# Patient Record
Sex: Female | Born: 1980
Health system: Southern US, Community
[De-identification: ages and names within clinical notes are randomized; demographics above are authoritative.]

## PROBLEM LIST (undated history)

## (undated) ENCOUNTER — Inpatient Hospital Stay (HOSPITAL_COMMUNITY): Payer: Self-pay

## (undated) DIAGNOSIS — M7989 Other specified soft tissue disorders: Secondary | ICD-10-CM

## (undated) DIAGNOSIS — Z6841 Body Mass Index (BMI) 40.0 and over, adult: Secondary | ICD-10-CM

## (undated) DIAGNOSIS — D869 Sarcoidosis, unspecified: Secondary | ICD-10-CM

## (undated) DIAGNOSIS — I10 Essential (primary) hypertension: Secondary | ICD-10-CM

## (undated) DIAGNOSIS — R7303 Prediabetes: Secondary | ICD-10-CM

## (undated) DIAGNOSIS — R002 Palpitations: Secondary | ICD-10-CM

## (undated) DIAGNOSIS — G4730 Sleep apnea, unspecified: Secondary | ICD-10-CM

## (undated) DIAGNOSIS — K219 Gastro-esophageal reflux disease without esophagitis: Secondary | ICD-10-CM

## (undated) DIAGNOSIS — R569 Unspecified convulsions: Secondary | ICD-10-CM

## (undated) DIAGNOSIS — E282 Polycystic ovarian syndrome: Secondary | ICD-10-CM

## (undated) DIAGNOSIS — E538 Deficiency of other specified B group vitamins: Secondary | ICD-10-CM

## (undated) DIAGNOSIS — E7800 Pure hypercholesterolemia, unspecified: Secondary | ICD-10-CM

## (undated) DIAGNOSIS — O10911 Unspecified pre-existing hypertension complicating pregnancy, first trimester: Secondary | ICD-10-CM

## (undated) DIAGNOSIS — R0602 Shortness of breath: Secondary | ICD-10-CM

## (undated) DIAGNOSIS — D649 Anemia, unspecified: Secondary | ICD-10-CM

## (undated) DIAGNOSIS — E559 Vitamin D deficiency, unspecified: Secondary | ICD-10-CM

## (undated) DIAGNOSIS — K5792 Diverticulitis of intestine, part unspecified, without perforation or abscess without bleeding: Secondary | ICD-10-CM

## (undated) HISTORY — DX: Sarcoidosis, unspecified: D86.9

## (undated) HISTORY — DX: Gastro-esophageal reflux disease without esophagitis: K21.9

## (undated) HISTORY — DX: Shortness of breath: R06.02

## (undated) HISTORY — DX: Pure hypercholesterolemia, unspecified: E78.00

## (undated) HISTORY — DX: Anemia, unspecified: D64.9

## (undated) HISTORY — DX: Unspecified pre-existing hypertension complicating pregnancy, first trimester: O10.911

## (undated) HISTORY — DX: Vitamin D deficiency, unspecified: E55.9

## (undated) HISTORY — DX: Body Mass Index (BMI) 40.0 and over, adult: Z684

## (undated) HISTORY — DX: Prediabetes: R73.03

## (undated) HISTORY — DX: Morbid (severe) obesity due to excess calories: E66.01

## (undated) HISTORY — DX: Other specified soft tissue disorders: M79.89

## (undated) HISTORY — DX: Sleep apnea, unspecified: G47.30

## (undated) HISTORY — DX: Essential (primary) hypertension: I10

## (undated) HISTORY — DX: Polycystic ovarian syndrome: E28.2

## (undated) HISTORY — DX: Deficiency of other specified B group vitamins: E53.8

---
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
## 2002-05-29 ENCOUNTER — Inpatient Hospital Stay (HOSPITAL_COMMUNITY): Admission: AD | Admit: 2002-05-29 | Discharge: 2002-05-29 | Payer: Self-pay | Admitting: *Deleted

## 2002-09-01 ENCOUNTER — Emergency Department (HOSPITAL_COMMUNITY): Admission: EM | Admit: 2002-09-01 | Discharge: 2002-09-01 | Payer: Self-pay | Admitting: Emergency Medicine

## 2002-11-12 ENCOUNTER — Inpatient Hospital Stay (HOSPITAL_COMMUNITY): Admission: AD | Admit: 2002-11-12 | Discharge: 2002-11-12 | Payer: Self-pay | Admitting: Obstetrics & Gynecology

## 2004-03-16 ENCOUNTER — Ambulatory Visit: Payer: Self-pay | Admitting: Gastroenterology

## 2004-04-10 ENCOUNTER — Ambulatory Visit: Payer: Self-pay | Admitting: Gastroenterology

## 2004-06-09 ENCOUNTER — Ambulatory Visit: Payer: Self-pay | Admitting: Gastroenterology

## 2004-06-16 ENCOUNTER — Ambulatory Visit: Payer: Self-pay | Admitting: Gastroenterology

## 2006-02-21 ENCOUNTER — Emergency Department (HOSPITAL_COMMUNITY): Admission: EM | Admit: 2006-02-21 | Discharge: 2006-02-21 | Payer: Self-pay | Admitting: Emergency Medicine

## 2008-03-12 ENCOUNTER — Emergency Department (HOSPITAL_COMMUNITY): Admission: EM | Admit: 2008-03-12 | Discharge: 2008-03-12 | Payer: Self-pay | Admitting: Emergency Medicine

## 2008-09-27 ENCOUNTER — Inpatient Hospital Stay (HOSPITAL_COMMUNITY): Admission: AD | Admit: 2008-09-27 | Discharge: 2008-09-28 | Payer: Self-pay | Admitting: Obstetrics & Gynecology

## 2008-09-27 ENCOUNTER — Ambulatory Visit: Payer: Self-pay | Admitting: Physician Assistant

## 2009-01-29 ENCOUNTER — Ambulatory Visit: Payer: Self-pay | Admitting: Obstetrics and Gynecology

## 2009-01-29 ENCOUNTER — Encounter: Payer: Self-pay | Admitting: Obstetrics and Gynecology

## 2009-01-30 ENCOUNTER — Encounter: Payer: Self-pay | Admitting: Obstetrics and Gynecology

## 2009-01-30 LAB — CONVERTED CEMR LAB
Estradiol: 134 pg/mL
FSH: 5.6 milliintl units/mL
Hgb A1c MFr Bld: 5.7 % (ref 4.6–6.1)
Prolactin: 8.1 ng/mL
Sex Hormone Binding: 26 nmol/L (ref 18–114)
TSH: 1.225 microintl units/mL (ref 0.350–4.500)
Testosterone Free: 21.7 pg/mL — ABNORMAL HIGH (ref 0.6–6.8)
Testosterone-% Free: 2.1 % (ref 0.4–2.4)
Testosterone: 103.45 ng/dL — ABNORMAL HIGH (ref 10–70)
Trich, Wet Prep: NONE SEEN
WBC, Wet Prep HPF POC: NONE SEEN
Yeast Wet Prep HPF POC: NONE SEEN

## 2009-02-03 ENCOUNTER — Ambulatory Visit (HOSPITAL_COMMUNITY): Admission: RE | Admit: 2009-02-03 | Discharge: 2009-02-03 | Payer: Self-pay | Admitting: Obstetrics and Gynecology

## 2009-02-13 ENCOUNTER — Ambulatory Visit: Payer: Self-pay | Admitting: Obstetrics & Gynecology

## 2009-04-11 ENCOUNTER — Ambulatory Visit: Payer: Self-pay | Admitting: Advanced Practice Midwife

## 2009-04-11 ENCOUNTER — Inpatient Hospital Stay (HOSPITAL_COMMUNITY): Admission: AD | Admit: 2009-04-11 | Discharge: 2009-04-11 | Payer: Self-pay | Admitting: Obstetrics & Gynecology

## 2009-05-22 ENCOUNTER — Ambulatory Visit: Payer: Self-pay | Admitting: Obstetrics and Gynecology

## 2010-01-28 ENCOUNTER — Emergency Department (HOSPITAL_COMMUNITY): Admission: EM | Admit: 2010-01-28 | Discharge: 2010-01-28 | Payer: Self-pay | Admitting: Emergency Medicine

## 2010-06-17 ENCOUNTER — Emergency Department (HOSPITAL_COMMUNITY)
Admission: EM | Admit: 2010-06-17 | Discharge: 2010-06-17 | Disposition: A | Discharge status: Home / Self Care | Payer: Self-pay | Attending: Emergency Medicine | Admitting: Emergency Medicine

## 2010-06-17 DIAGNOSIS — K089 Disorder of teeth and supporting structures, unspecified: Secondary | ICD-10-CM | POA: Insufficient documentation

## 2010-07-08 ENCOUNTER — Emergency Department (HOSPITAL_COMMUNITY)
Admission: EM | Admit: 2010-07-08 | Discharge: 2010-07-08 | Disposition: A | Discharge status: Home / Self Care | Payer: Self-pay | Attending: Emergency Medicine | Admitting: Emergency Medicine

## 2010-07-08 DIAGNOSIS — R21 Rash and other nonspecific skin eruption: Secondary | ICD-10-CM | POA: Insufficient documentation

## 2010-07-08 DIAGNOSIS — K029 Dental caries, unspecified: Secondary | ICD-10-CM | POA: Insufficient documentation

## 2010-07-08 DIAGNOSIS — G40909 Epilepsy, unspecified, not intractable, without status epilepticus: Secondary | ICD-10-CM | POA: Insufficient documentation

## 2010-07-08 DIAGNOSIS — K089 Disorder of teeth and supporting structures, unspecified: Secondary | ICD-10-CM | POA: Insufficient documentation

## 2010-07-27 LAB — CBC
MCHC: 33.2 g/dL (ref 30.0–36.0)
MCV: 84.9 fL (ref 78.0–100.0)
RBC: 4.29 MIL/uL (ref 3.87–5.11)
RDW: 15.1 % (ref 11.5–15.5)

## 2010-08-03 LAB — URINALYSIS, ROUTINE W REFLEX MICROSCOPIC
Glucose, UA: NEGATIVE mg/dL
Ketones, ur: NEGATIVE mg/dL
Leukocytes, UA: NEGATIVE
Protein, ur: NEGATIVE mg/dL
Urobilinogen, UA: 0.2 mg/dL (ref 0.0–1.0)

## 2010-08-03 LAB — URINE MICROSCOPIC-ADD ON

## 2010-12-09 ENCOUNTER — Emergency Department (HOSPITAL_COMMUNITY)
Admission: EM | Admit: 2010-12-09 | Discharge: 2010-12-09 | Disposition: A | Discharge status: Home / Self Care | Payer: Self-pay | Attending: Emergency Medicine | Admitting: Emergency Medicine

## 2010-12-09 DIAGNOSIS — B373 Candidiasis of vulva and vagina: Secondary | ICD-10-CM | POA: Insufficient documentation

## 2010-12-09 DIAGNOSIS — B3731 Acute candidiasis of vulva and vagina: Secondary | ICD-10-CM | POA: Insufficient documentation

## 2010-12-09 LAB — WET PREP, GENITAL: Yeast Wet Prep HPF POC: NONE SEEN

## 2010-12-21 ENCOUNTER — Emergency Department (HOSPITAL_COMMUNITY)
Admission: EM | Admit: 2010-12-21 | Discharge: 2010-12-21 | Disposition: A | Discharge status: Home / Self Care | Payer: Self-pay | Attending: Emergency Medicine | Admitting: Emergency Medicine

## 2010-12-21 DIAGNOSIS — M79609 Pain in unspecified limb: Secondary | ICD-10-CM | POA: Insufficient documentation

## 2011-10-22 ENCOUNTER — Emergency Department (HOSPITAL_COMMUNITY)
Admission: EM | Admit: 2011-10-22 | Discharge: 2011-10-22 | Disposition: A | Discharge status: Home / Self Care | Payer: No Typology Code available for payment source | Attending: Emergency Medicine | Admitting: Emergency Medicine

## 2011-10-22 ENCOUNTER — Encounter (HOSPITAL_COMMUNITY): Payer: Self-pay | Admitting: Emergency Medicine

## 2011-10-22 ENCOUNTER — Emergency Department (HOSPITAL_COMMUNITY): Payer: No Typology Code available for payment source

## 2011-10-22 DIAGNOSIS — M79609 Pain in unspecified limb: Secondary | ICD-10-CM | POA: Insufficient documentation

## 2011-10-22 DIAGNOSIS — M79602 Pain in left arm: Secondary | ICD-10-CM

## 2011-10-22 DIAGNOSIS — M542 Cervicalgia: Secondary | ICD-10-CM | POA: Insufficient documentation

## 2011-10-22 DIAGNOSIS — M549 Dorsalgia, unspecified: Secondary | ICD-10-CM | POA: Insufficient documentation

## 2011-10-22 DIAGNOSIS — F17200 Nicotine dependence, unspecified, uncomplicated: Secondary | ICD-10-CM | POA: Insufficient documentation

## 2011-10-22 HISTORY — DX: Unspecified convulsions: R56.9

## 2011-10-22 MED ORDER — NAPROXEN 375 MG PO TABS: 375.0000 mg | ORAL_TABLET | Freq: Two times a day (BID) | ORAL | Status: DC

## 2011-10-22 MED ORDER — KETOROLAC TROMETHAMINE 60 MG/2ML IM SOLN
60.0000 mg | Freq: Once | INTRAMUSCULAR | Status: AC
Start: 2011-10-22 — End: 2011-10-22
  Administered 2011-10-22: 60 mg via INTRAMUSCULAR
  Filled 2011-10-22: qty 2

## 2011-10-22 MED ORDER — METHOCARBAMOL 500 MG PO TABS: 500.0000 mg | ORAL_TABLET | Freq: Four times a day (QID) | ORAL | Status: AC | PRN

## 2011-10-22 NOTE — ED Provider Notes (Signed)
Medical screening examination/treatment/procedure(s) were performed by non-physician practitioner and as supervising physician I was immediately available for consultation/collaboration.   Forbes Cellar, MD 10/22/11 1645
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
## 2012-02-09 ENCOUNTER — Inpatient Hospital Stay (HOSPITAL_COMMUNITY): Payer: Medicaid Other

## 2012-02-09 ENCOUNTER — Inpatient Hospital Stay (HOSPITAL_COMMUNITY)
Admission: AD | Admit: 2012-02-09 | Discharge: 2012-02-09 | Disposition: A | Discharge status: Home / Self Care | Payer: Medicaid Other | Source: Ambulatory Visit | Attending: Obstetrics & Gynecology | Admitting: Obstetrics & Gynecology

## 2012-02-09 ENCOUNTER — Encounter (HOSPITAL_COMMUNITY): Payer: Self-pay | Admitting: *Deleted

## 2012-02-09 DIAGNOSIS — O26899 Other specified pregnancy related conditions, unspecified trimester: Secondary | ICD-10-CM

## 2012-02-09 DIAGNOSIS — N926 Irregular menstruation, unspecified: Secondary | ICD-10-CM

## 2012-02-09 DIAGNOSIS — E669 Obesity, unspecified: Secondary | ICD-10-CM | POA: Insufficient documentation

## 2012-02-09 DIAGNOSIS — E282 Polycystic ovarian syndrome: Secondary | ICD-10-CM | POA: Insufficient documentation

## 2012-02-09 DIAGNOSIS — R109 Unspecified abdominal pain: Secondary | ICD-10-CM

## 2012-02-09 DIAGNOSIS — N949 Unspecified condition associated with female genital organs and menstrual cycle: Secondary | ICD-10-CM

## 2012-02-09 DIAGNOSIS — Z3400 Encounter for supervision of normal first pregnancy, unspecified trimester: Secondary | ICD-10-CM

## 2012-02-09 DIAGNOSIS — O99891 Other specified diseases and conditions complicating pregnancy: Secondary | ICD-10-CM | POA: Insufficient documentation

## 2012-02-09 DIAGNOSIS — O34599 Maternal care for other abnormalities of gravid uterus, unspecified trimester: Secondary | ICD-10-CM | POA: Insufficient documentation

## 2012-02-09 HISTORY — DX: Diverticulitis of intestine, part unspecified, without perforation or abscess without bleeding: K57.92

## 2012-02-09 LAB — WET PREP, GENITAL

## 2012-02-09 LAB — URINALYSIS, ROUTINE W REFLEX MICROSCOPIC
Bilirubin Urine: NEGATIVE
Ketones, ur: NEGATIVE mg/dL
Leukocytes, UA: NEGATIVE
Nitrite: NEGATIVE
Urobilinogen, UA: 0.2 mg/dL (ref 0.0–1.0)

## 2012-02-09 MED ORDER — PRENATAL VITAMINS (DIS) PO TABS: 1.0000 | ORAL_TABLET | Freq: Every day | ORAL | Status: DC

## 2012-02-09 NOTE — MAU Note (Signed)
Patient states she has a history of irregular periods. Has been diagnosed with PCOS. Had some regular periods from October 2012 to June 2013, has not had one since. Has had breast pain and abdominal pain for a while. Denies any bleeding or discharge at this time.

## 2012-02-09 NOTE — MAU Provider Note (Signed)
  History     CSN: [id number]  Arrival date and time: 02/09/12 1800   First Provider Initiated Contact with Patient 02/09/12 2036      Chief Complaint  Patient presents with  . Abdominal Pain   HPI  Diana Blair is a 31 y.o. G1P0 who presents today with abdominal pain and uncertain LMP. She has had the pain off and on for a few weeks now. She has irregular menstrual cycles and was "shocked" to hear her UPT was positive today. Due to a HX of PCOS is has no idea how far along she could be. She denies any vaginal bleeding or change in discharge.   Past Medical History  Diagnosis Date  . Seizure   . Diverticulitis     Past Surgical History  Procedure Date  . No past surgeries     Family History  Problem Relation Age of Onset  . Other Neg Hx     History  Substance Use Topics  . Smoking status: Former Smoker    Types: Cigarettes    Quit date: 02/08/2009  . Smokeless tobacco: Never Used  . Alcohol Use: No    Allergies: No Known Allergies  No prescriptions prior to admission    Review of Systems  Constitutional: Negative for fever and chills.  Eyes: Negative for blurred vision.  Respiratory: Negative for cough.   Cardiovascular: Negative for chest pain.  Gastrointestinal: Positive for abdominal pain (see HPI). Negative for heartburn, nausea, vomiting, diarrhea and constipation.  Genitourinary: Positive for dysuria. Negative for urgency and frequency.  Neurological: Negative for headaches.   Physical Exam   Blood pressure 127/72, pulse 90, temperature 98.6 F (37 C), temperature source Oral, resp. rate 20, height 5\' 5"  (1.651 m), weight 160.936 kg (354 lb 12.8 oz), last menstrual period 10/16/2011, SpO2 100.00%.  Physical Exam  Nursing note and vitals reviewed. Constitutional: She appears well-developed and well-nourished.  Cardiovascular: Normal rate and regular rhythm.   Respiratory: Effort normal and breath sounds normal.  GI: Soft. Bowel sounds are  normal.  Genitourinary: Vagina normal and uterus normal.        External: normal Vagina: normal Cervix: pink, smooth. No CMT Uterus: 14+ weeks size    MAU Course  Procedures  MATERNAL FINDINGS: Cervix: Cervical length measures cervical length measures 3.1 cm. Cervix is closed. Uterus/Adnexae: Both ovaries are visualized. Simple appearing cyst in the left ovary measuring 2 x 1.9 x 2.9 cm. No abnormal adnexal masses visualized. No free pelvic fluid collections.  IMPRESSION: Single intrauterine pregnancy. Estimated gestational age by BPD is 16 weeks 2 days. No abnormal extra-axial fluid collections. Simple appearing cyst in the left ovary.      Assessment and Plan   1. Abdominal pain complicating pregnancy   2. Obesity, morbid (more than 100 lbs over ideal weight or BMI > 40)   3. Polycystic ovarian syndrome   4. Irregular periods/menstrual cycles   5. Supervision of normal first pregnancy   6. Round ligament pain    Will start prenatal vitamins Second trimester danger signs reviewed Will schedule appt with [facility]  Tawnya Crook 02/09/2012, 8:42 PM

## 2012-02-10 NOTE — MAU Provider Note (Signed)
Pt called to say that the pharmacy did not have the prenatal vitamins that were ordered.  Pt wants prenatal vitamins called to [facility].  Prenatal Vitamin prescription called to [facility] [phone]; order given directly to pharmacist to give pt what they had available.

## 2012-02-11 NOTE — MAU Provider Note (Signed)
Chart reviewed and agree with management and plan.  

## 2012-02-23 ENCOUNTER — Ambulatory Visit (INDEPENDENT_AMBULATORY_CARE_PROVIDER_SITE_OTHER): Payer: Self-pay | Admitting: Obstetrics and Gynecology

## 2012-02-23 ENCOUNTER — Encounter: Payer: Self-pay | Admitting: Obstetrics and Gynecology

## 2012-02-23 VITALS — BP 140/96 | Temp 98.9°F | Wt 353.0 lb

## 2012-02-23 DIAGNOSIS — O0990 Supervision of high risk pregnancy, unspecified, unspecified trimester: Secondary | ICD-10-CM | POA: Insufficient documentation

## 2012-02-23 DIAGNOSIS — Z3400 Encounter for supervision of normal first pregnancy, unspecified trimester: Secondary | ICD-10-CM

## 2012-02-23 DIAGNOSIS — R030 Elevated blood-pressure reading, without diagnosis of hypertension: Secondary | ICD-10-CM

## 2012-02-23 DIAGNOSIS — IMO0001 Reserved for inherently not codable concepts without codable children: Secondary | ICD-10-CM | POA: Insufficient documentation

## 2012-02-23 LAB — POCT URINALYSIS DIP (DEVICE)
Bilirubin Urine: NEGATIVE
Glucose, UA: NEGATIVE mg/dL
Ketones, ur: NEGATIVE mg/dL
Leukocytes, UA: NEGATIVE
Specific Gravity, Urine: 1.015 (ref 1.005–1.030)

## 2012-02-23 NOTE — Progress Notes (Signed)
   Subjective:    Diana Blair is a G1P0 [redacted]w[redacted]d being seen today for her first obstetrical visit. . Patient does intend to breast feed. Pregnancy history fully reviewed.  Patient reports legs ache from prolonged standing at [place] job. Wants note for work.Ceasar Mons Vitals:   02/23/12 1020  BP: 140/96  Temp: 98.9 F (37.2 C)  Weight: 353 lb (160.12 kg)    HISTORY: OB History    Grav Para Term Preterm Abortions TAB SAB Ect Mult Living   1         0     # Outc Date GA Lbr Len/2nd Wgt Sex Del Anes PTL Lv   1 CUR              Past Medical History  Diagnosis Date  . Seizure   . Diverticulitis   . PCOS (polycystic ovarian syndrome)    Past Surgical History  Procedure Date  . No past surgeries    Family History  Problem Relation Age of Onset  . Other Neg Hx   . Cancer Mother     lung  . Diabetes Father      Exam    Uterus:   18wk size FHR 150  Pelvic Exam:    Perineum: No Hemorrhoids, Normal Perineum   Vulva: normal, Bartholin's, Urethra, Skene's normal   Vagina:  normal mucosa, normal discharge       Cervix: no lesions   Adnexa: not evaluated   Bony Pelvis: gynecoid  System: Breast:  normal appearance, no masses or tenderness   Skin: normal coloration and turgor, no rashes    Neurologic: oriented, normal mood, grossly non-focal   Extremities: normal strength, tone, and muscle mass   HEENT PERRLA and thyroid without masses   Mouth/Teeth mucous membranes moist, pharynx normal without lesions   Neck supple and no masses   Cardiovascular: regular rate and rhythm   Respiratory:  appears well, vitals normal, no respiratory distress, acyanotic, normal RR, ear and throat exam is normal, neck free of mass or lymphadenopathy, chest clear, no wheezing, crepitations, rhonchi, normal symmetric air entry   Abdomen: soft, non-tender; bowel sounds normal; no masses,  no organomegaly obese   Urinary: urethral meatus normal      Assessment:    Pregnancy: G1P0 @  [redacted]w[redacted]d Morbid obesity      Plan:     Initial labs drawn. 1 hr glucola and Pap done Prenatal vitamins. Problem list reviewed and updated. Genetic Screening discussed Quad Screen: declined.  Ultrasound discussed; fetal survey: will schedule today.  Follow up in 4 weeks. 70% of 40 min visit spent on counseling and coordination of care.  Discussed at length diet, social situation. Advised to call [facility] re: further pregnancy information and classes.    Ermelinda Eckert 02/23/2012  BP recheckk 142/80

## 2012-02-23 NOTE — Patient Instructions (Signed)
Pregnancy - Second Trimester The second trimester of pregnancy (3 to 6 months) is a period of rapid growth for you and your baby. At the end of the sixth month, your baby is about 9 inches long and weighs 1 1/2 pounds. You will begin to feel the baby move between 18 and 20 weeks of the pregnancy. This is called quickening. Weight gain is faster. A clear fluid (colostrum) may leak out of your breasts. You may feel small contractions of the womb (uterus). This is known as false labor or Braxton-Hicks contractions. This is like a practice for labor when the baby is ready to be born. Usually, the problems with morning sickness have usually passed by the end of your first trimester. Some women develop small dark blotches (called cholasma, mask of pregnancy) on their face that usually goes away after the baby is born. Exposure to the sun makes the blotches worse. Acne may also develop in some pregnant women and pregnant women who have acne, may find that it goes away. PRENATAL EXAMS  Blood work may continue to be done during prenatal exams. These tests are done to check on your health and the probable health of your baby. Blood work is used to follow your blood levels (hemoglobin). Anemia (low hemoglobin) is common during pregnancy. Iron and vitamins are given to help prevent this. You will also be checked for diabetes between 24 and 28 weeks of the pregnancy. Some of the previous blood tests may be repeated.  The size of the uterus is measured during each visit. This is to make sure that the baby is continuing to grow properly according to the dates of the pregnancy.  Your blood pressure is checked every prenatal visit. This is to make sure you are not getting toxemia.  Your urine is checked to make sure you do not have an infection, diabetes or protein in the urine.  Your weight is checked often to make sure gains are happening at the suggested rate. This is to ensure that both you and your baby are growing  normally.  Sometimes, an ultrasound is performed to confirm the proper growth and development of the baby. This is a test which bounces harmless sound waves off the baby so your caregiver can more accurately determine due dates. Sometimes, a specialized test is done on the amniotic fluid surrounding the baby. This test is called an amniocentesis. The amniotic fluid is obtained by sticking a needle into the belly (abdomen). This is done to check the chromosomes in instances where there is a concern about possible genetic problems with the baby. It is also sometimes done near the end of pregnancy if an early delivery is required. In this case, it is done to help make sure the baby's lungs are mature enough for the baby to live outside of the womb. CHANGES OCCURING IN THE SECOND TRIMESTER OF PREGNANCY Your body goes through many changes during pregnancy. They vary from person to person. Talk to your caregiver about changes you notice that you are concerned about.  During the second trimester, you will likely have an increase in your appetite. It is normal to have cravings for certain foods. This varies from person to person and pregnancy to pregnancy.  Your lower abdomen will begin to bulge.  You may have to urinate more often because the uterus and baby are pressing on your bladder. It is also common to get more bladder infections during pregnancy (pain with urination). You can help this by   drinking lots of fluids and emptying your bladder before and after intercourse.  You may begin to get stretch marks on your hips, abdomen, and breasts. These are normal changes in the body during pregnancy. There are no exercises or medications to take that prevent this change.  You may begin to develop swollen and bulging veins (varicose veins) in your legs. Wearing support hose, elevating your feet for 15 minutes, 3 to 4 times a day and limiting salt in your diet helps lessen the problem.  Heartburn may develop  as the uterus grows and pushes up against the stomach. Antacids recommended by your caregiver helps with this problem. Also, eating smaller meals 4 to 5 times a day helps.  Constipation can be treated with a stool softener or adding bulk to your diet. Drinking lots of fluids, vegetables, fruits, and whole grains are helpful.  Exercising is also helpful. If you have been very active up until your pregnancy, most of these activities can be continued during your pregnancy. If you have been less active, it is helpful to start an exercise program such as walking.  Hemorrhoids (varicose veins in the rectum) may develop at the end of the second trimester. Warm sitz baths and hemorrhoid cream recommended by your caregiver helps hemorrhoid problems.  Backaches may develop during this time of your pregnancy. Avoid heavy lifting, wear low heal shoes and practice good posture to help with backache problems.  Some pregnant women develop tingling and numbness of their hand and fingers because of swelling and tightening of ligaments in the wrist (carpel tunnel syndrome). This goes away after the baby is born.  As your breasts enlarge, you may have to get a bigger bra. Get a comfortable, cotton, support bra. Do not get a nursing bra until the last month of the pregnancy if you will be nursing the baby.  You may get a dark line from your belly button to the pubic area called the linea nigra.  You may develop rosy cheeks because of increase blood flow to the face.  You may develop spider looking lines of the face, neck, arms and chest. These go away after the baby is born. HOME CARE INSTRUCTIONS   It is extremely important to avoid all smoking, herbs, alcohol, and unprescribed drugs during your pregnancy. These chemicals affect the formation and growth of the baby. Avoid these chemicals throughout the pregnancy to ensure the delivery of a healthy infant.  Most of your home care instructions are the same as  suggested for the first trimester of your pregnancy. Keep your caregiver's appointments. Follow your caregiver's instructions regarding medication use, exercise and diet.  During pregnancy, you are providing food for you and your baby. Continue to eat regular, well-balanced meals. Choose foods such as meat, fish, milk and other low fat dairy products, vegetables, fruits, and whole-grain breads and cereals. Your caregiver will tell you of the ideal weight gain.  A physical sexual relationship may be continued up until near the end of pregnancy if there are no other problems. Problems could include early (premature) leaking of amniotic fluid from the membranes, vaginal bleeding, abdominal pain, or other medical or pregnancy problems.  Exercise regularly if there are no restrictions. Check with your caregiver if you are unsure of the safety of some of your exercises. The greatest weight gain will occur in the last 2 trimesters of pregnancy. Exercise will help you:  Control your weight.  Get you in shape for labor and delivery.  Lose weight   after you have the baby.  Wear a good support or jogging bra for breast tenderness during pregnancy. This may help if worn during sleep. Pads or tissues may be used in the bra if you are leaking colostrum.  Do not use hot tubs, steam rooms or saunas throughout the pregnancy.  Wear your seat belt at all times when driving. This protects you and your baby if you are in an accident.  Avoid raw meat, uncooked cheese, cat litter boxes and soil used by cats. These carry germs that can cause birth defects in the baby.  The second trimester is also a good time to visit your dentist for your dental health if this has not been done yet. Getting your teeth cleaned is OK. Use a soft toothbrush. Brush gently during pregnancy.  It is easier to loose urine during pregnancy. Tightening up and strengthening the pelvic muscles will help with this problem. Practice stopping your  urination while you are going to the bathroom. These are the same muscles you need to strengthen. It is also the muscles you would use as if you were trying to stop from passing gas. You can practice tightening these muscles up 10 times a set and repeating this about 3 times per day. Once you know what muscles to tighten up, do not perform these exercises during urination. It is more likely to contribute to an infection by backing up the urine.  Ask for help if you have financial, counseling or nutritional needs during pregnancy. Your caregiver will be able to offer counseling for these needs as well as refer you for other special needs.  Your skin may become oily. If so, wash your face with mild soap, use non-greasy moisturizer and oil or cream based makeup. MEDICATIONS AND DRUG USE IN PREGNANCY  Take prenatal vitamins as directed. The vitamin should contain 1 milligram of folic acid. Keep all vitamins out of reach of children. Only a couple vitamins or tablets containing iron may be fatal to a baby or young child when ingested.  Avoid use of all medications, including herbs, over-the-counter medications, not prescribed or suggested by your caregiver. Only take over-the-counter or prescription medicines for pain, discomfort, or fever as directed by your caregiver. Do not use aspirin.  Let your caregiver also know about herbs you may be using.  Alcohol is related to a number of birth defects. This includes fetal alcohol syndrome. All alcohol, in any form, should be avoided completely. Smoking will cause low birth rate and premature babies.  Street or illegal drugs are very harmful to the baby. They are absolutely forbidden. A baby born to an addicted mother will be addicted at birth. The baby will go through the same withdrawal an adult does. SEEK MEDICAL CARE IF:  You have any concerns or worries during your pregnancy. It is better to call with your questions if you feel they cannot wait, rather  than worry about them. SEEK IMMEDIATE MEDICAL CARE IF:   An unexplained oral temperature above 102 F (38.9 C) develops, or as your caregiver suggests.  You have leaking of fluid from the vagina (birth canal). If leaking membranes are suspected, take your temperature and tell your caregiver of this when you call.  There is vaginal spotting, bleeding, or passing clots. Tell your caregiver of the amount and how many pads are used. Light spotting in pregnancy is common, especially following intercourse.  You develop a bad smelling vaginal discharge with a change in the color from clear   to white.  You continue to feel sick to your stomach (nauseated) and have no relief from remedies suggested. You vomit blood or coffee ground-like materials.  You lose more than 2 pounds of weight or gain more than 2 pounds of weight over 1 week, or as suggested by your caregiver.  You notice swelling of your face, hands, feet, or legs.  You get exposed to German measles and have never had them.  You are exposed to fifth disease or chickenpox.  You develop belly (abdominal) pain. Round ligament discomfort is a common non-cancerous (benign) cause of abdominal pain in pregnancy. Your caregiver still must evaluate you.  You develop a bad headache that does not go away.  You develop fever, diarrhea, pain with urination, or shortness of breath.  You develop visual problems, blurry, or double vision.  You fall or are in a car accident or any kind of trauma.  There is mental or physical violence at home. Document Released: 04/06/2001 Document Revised: 07/05/2011 Document Reviewed: 10/09/2008 [employer] Patient Information 2013 [employer].  

## 2012-02-23 NOTE — Progress Notes (Signed)
Ob detail [place] scheduled on 11/1 @ 1400

## 2012-02-23 NOTE — Progress Notes (Signed)
Pulse 89.  2nd BP 142/80 C/o pain in pelvic, lower extremities; no pressure.

## 2012-02-24 LAB — GLUCOSE TOLERANCE, 1 HOUR (50G) W/O FASTING: Glucose, 1 Hour GTT: 87 mg/dL (ref 70–140)

## 2012-02-25 ENCOUNTER — Ambulatory Visit (HOSPITAL_COMMUNITY)
Admission: RE | Admit: 2012-02-25 | Discharge: 2012-02-25 | Disposition: A | Discharge status: Home / Self Care | Payer: Medicaid Other | Source: Ambulatory Visit | Attending: Obstetrics and Gynecology | Admitting: Obstetrics and Gynecology

## 2012-02-25 DIAGNOSIS — Z363 Encounter for antenatal screening for malformations: Secondary | ICD-10-CM | POA: Insufficient documentation

## 2012-02-25 DIAGNOSIS — E669 Obesity, unspecified: Secondary | ICD-10-CM | POA: Insufficient documentation

## 2012-02-25 DIAGNOSIS — O358XX0 Maternal care for other (suspected) fetal abnormality and damage, not applicable or unspecified: Secondary | ICD-10-CM | POA: Insufficient documentation

## 2012-02-25 DIAGNOSIS — Z3400 Encounter for supervision of normal first pregnancy, unspecified trimester: Secondary | ICD-10-CM

## 2012-02-25 DIAGNOSIS — Z1389 Encounter for screening for other disorder: Secondary | ICD-10-CM | POA: Insufficient documentation

## 2012-03-08 ENCOUNTER — Ambulatory Visit (INDEPENDENT_AMBULATORY_CARE_PROVIDER_SITE_OTHER): Payer: Self-pay | Admitting: Family

## 2012-03-08 ENCOUNTER — Telehealth: Payer: Self-pay | Admitting: General Practice

## 2012-03-08 VITALS — BP 136/88 | Temp 98.7°F | Wt 349.4 lb

## 2012-03-08 DIAGNOSIS — Z3400 Encounter for supervision of normal first pregnancy, unspecified trimester: Secondary | ICD-10-CM

## 2012-03-08 LAB — COMPREHENSIVE METABOLIC PANEL
ALT: 19 U/L (ref 0–35)
AST: 12 U/L (ref 0–37)
CO2: 24 mEq/L (ref 19–32)
Chloride: 104 mEq/L (ref 96–112)
Creat: 0.57 mg/dL (ref 0.50–1.10)
Sodium: 135 mEq/L (ref 135–145)
Total Bilirubin: 0.2 mg/dL — ABNORMAL LOW (ref 0.3–1.2)
Total Protein: 6.6 g/dL (ref 6.0–8.3)

## 2012-03-08 LAB — POCT URINALYSIS DIP (DEVICE)
Glucose, UA: NEGATIVE mg/dL
Hgb urine dipstick: NEGATIVE
Nitrite: NEGATIVE
Protein, ur: NEGATIVE mg/dL
Specific Gravity, Urine: 1.02 (ref 1.005–1.030)
Urobilinogen, UA: 0.2 mg/dL (ref 0.0–1.0)

## 2012-03-08 NOTE — Progress Notes (Signed)
Pulse: 86 Needs OB Panel drawn and HIV. Not drawn at last visit.  Has some pelvic pressure. Has pain if she stands for too long. Has pain at night trouble getting comfortable.  Has a wet mucous discharge.

## 2012-03-08 NOTE — Progress Notes (Signed)
Reports vaginal discharge with odor; obtain gc/ct and wet prep today; also needed OB panel, HIV, and urine culture.  Due to borderline blood pressure, obtain baseline PIH labs.  If bp remains elevated transfer to high risk clinic.

## 2012-03-08 NOTE — Telephone Encounter (Signed)
Patient called stating she was trying to reach [facility], she saw her today and forgot to ask Eino Farber a question and would like a call back. Called patient back and informed her I received her message, patient stated she was driving down the road and couldn't talk right now. I told patient to give [place] a call back at her convenience. Patient verbalized understanding

## 2012-03-09 LAB — OBSTETRIC PANEL
Basophils Absolute: 0 10*3/uL (ref 0.0–0.1)
Hepatitis B Surface Ag: NEGATIVE
Lymphocytes Relative: 15 % (ref 12–46)
Lymphs Abs: 1.6 10*3/uL (ref 0.7–4.0)
Neutrophils Relative %: 76 % (ref 43–77)
Platelets: 229 10*3/uL (ref 150–400)
RBC: 4.55 MIL/uL (ref 3.87–5.11)
RDW: 17.2 % — ABNORMAL HIGH (ref 11.5–15.5)
Rubella: 162.7 IU/mL — ABNORMAL HIGH
WBC: 10.4 10*3/uL (ref 4.0–10.5)

## 2012-03-09 NOTE — Telephone Encounter (Signed)
Called pt and left message that we have attempted to contact her x 2 and if she continues to have any questions to please give the clinics a call back.

## 2012-03-10 LAB — CULTURE, OB URINE: Colony Count: 25000

## 2012-03-11 ENCOUNTER — Encounter (HOSPITAL_COMMUNITY): Payer: Self-pay | Admitting: Women's Health

## 2012-03-11 ENCOUNTER — Inpatient Hospital Stay (HOSPITAL_COMMUNITY)
Admission: AD | Admit: 2012-03-11 | Discharge: 2012-03-19 | DRG: 775 | Disposition: A | Discharge status: Home / Self Care | Payer: Medicaid Other | Source: Ambulatory Visit | Attending: Obstetrics & Gynecology | Admitting: Obstetrics & Gynecology

## 2012-03-11 ENCOUNTER — Inpatient Hospital Stay (HOSPITAL_COMMUNITY): Payer: Medicaid Other

## 2012-03-11 DIAGNOSIS — O42919 Preterm premature rupture of membranes, unspecified as to length of time between rupture and onset of labor, unspecified trimester: Secondary | ICD-10-CM | POA: Diagnosis not present

## 2012-03-11 DIAGNOSIS — A499 Bacterial infection, unspecified: Secondary | ICD-10-CM | POA: Diagnosis present

## 2012-03-11 DIAGNOSIS — O4290 Premature rupture of membranes, unspecified as to length of time between rupture and onset of labor, unspecified weeks of gestation: Secondary | ICD-10-CM

## 2012-03-11 DIAGNOSIS — B9689 Other specified bacterial agents as the cause of diseases classified elsewhere: Secondary | ICD-10-CM | POA: Diagnosis present

## 2012-03-11 DIAGNOSIS — O411090 Infection of amniotic sac and membranes, unspecified, unspecified trimester, not applicable or unspecified: Secondary | ICD-10-CM

## 2012-03-11 DIAGNOSIS — O3430 Maternal care for cervical incompetence, unspecified trimester: Secondary | ICD-10-CM

## 2012-03-11 DIAGNOSIS — O411290 Chorioamnionitis, unspecified trimester, not applicable or unspecified: Secondary | ICD-10-CM | POA: Diagnosis not present

## 2012-03-11 DIAGNOSIS — E669 Obesity, unspecified: Secondary | ICD-10-CM | POA: Diagnosis present

## 2012-03-11 DIAGNOSIS — O2390 Unspecified genitourinary tract infection in pregnancy, unspecified trimester: Secondary | ICD-10-CM | POA: Diagnosis present

## 2012-03-11 DIAGNOSIS — N760 Acute vaginitis: Secondary | ICD-10-CM | POA: Diagnosis present

## 2012-03-11 LAB — URINALYSIS, ROUTINE W REFLEX MICROSCOPIC
Bilirubin Urine: NEGATIVE
Glucose, UA: NEGATIVE mg/dL
Hgb urine dipstick: NEGATIVE
Specific Gravity, Urine: 1.02 (ref 1.005–1.030)
Urobilinogen, UA: 0.2 mg/dL (ref 0.0–1.0)
pH: 6.5 (ref 5.0–8.0)

## 2012-03-11 LAB — CBC
HCT: 32.5 % — ABNORMAL LOW (ref 36.0–46.0)
Hemoglobin: 11 g/dL — ABNORMAL LOW (ref 12.0–15.0)
MCV: 79.9 fL (ref 78.0–100.0)
RBC: 4.07 MIL/uL (ref 3.87–5.11)
RDW: 16.1 % — ABNORMAL HIGH (ref 11.5–15.5)
WBC: 10.5 10*3/uL (ref 4.0–10.5)

## 2012-03-11 LAB — TYPE AND SCREEN
ABO/RH(D): O POS
Antibody Screen: NEGATIVE

## 2012-03-11 LAB — AMNISURE RUPTURE OF MEMBRANE (ROM) NOT AT ARMC: Amnisure ROM: NEGATIVE

## 2012-03-11 LAB — WET PREP, GENITAL: Trich, Wet Prep: NONE SEEN

## 2012-03-11 LAB — GROUP B STREP BY PCR: Group B strep by PCR: NEGATIVE

## 2012-03-11 LAB — ABO/RH: ABO/RH(D): O POS

## 2012-03-11 MED ORDER — DOCUSATE SODIUM 100 MG PO CAPS
100.0000 mg | ORAL_CAPSULE | Freq: Every day | ORAL | Status: DC
  Administered 2012-03-11 – 2012-03-16 (×6): 100 mg via ORAL
  Filled 2012-03-11 (×8): qty 1

## 2012-03-11 MED ORDER — LACTATED RINGERS IV SOLN
INTRAVENOUS | Status: DC
  Administered 2012-03-12 – 2012-03-13 (×5): via INTRAVENOUS
  Administered 2012-03-13: 20 mL/h via INTRAVENOUS
  Administered 2012-03-15: 20 mL via INTRAVENOUS

## 2012-03-11 MED ORDER — METRONIDAZOLE 500 MG PO TABS
500.0000 mg | ORAL_TABLET | Freq: Two times a day (BID) | ORAL | Status: DC
  Administered 2012-03-11 – 2012-03-16 (×11): 500 mg via ORAL
  Filled 2012-03-11 (×14): qty 1

## 2012-03-11 MED ORDER — SODIUM CHLORIDE 0.9 % IV SOLN
250.0000 mg | Freq: Four times a day (QID) | INTRAVENOUS | Status: DC
  Administered 2012-03-11 – 2012-03-13 (×8): 250 mg via INTRAVENOUS
  Filled 2012-03-11 (×9): qty 250

## 2012-03-11 MED ORDER — PRENATAL MULTIVITAMIN CH
1.0000 | ORAL_TABLET | Freq: Every day | ORAL | Status: DC
  Administered 2012-03-11 – 2012-03-16 (×6): 1 via ORAL
  Filled 2012-03-11 (×8): qty 1

## 2012-03-11 MED ORDER — LIDOCAINE HCL (PF) 1 % IJ SOLN
INTRAMUSCULAR | Status: AC
Start: 2012-03-11 — End: 2012-03-12
  Filled 2012-03-11: qty 30

## 2012-03-11 MED ORDER — SODIUM CHLORIDE 0.9 % IV SOLN
2.0000 g | Freq: Four times a day (QID) | INTRAVENOUS | Status: DC
  Administered 2012-03-11 – 2012-03-13 (×8): 2 g via INTRAVENOUS
  Filled 2012-03-11 (×8): qty 2000

## 2012-03-11 MED ORDER — AMOXICILLIN 500 MG PO CAPS
500.0000 mg | ORAL_CAPSULE | Freq: Three times a day (TID) | ORAL | Status: DC
  Administered 2012-03-13 – 2012-03-17 (×11): 500 mg via ORAL
  Filled 2012-03-11 (×15): qty 1

## 2012-03-11 MED ORDER — ZOLPIDEM TARTRATE 5 MG PO TABS: 5.0000 mg | ORAL_TABLET | Freq: Every evening | ORAL | Status: DC | PRN

## 2012-03-11 MED ORDER — CALCIUM CARBONATE ANTACID 500 MG PO CHEW
2.0000 | CHEWABLE_TABLET | ORAL | Status: DC | PRN
  Filled 2012-03-11: qty 2

## 2012-03-11 MED ORDER — ACETAMINOPHEN 325 MG PO TABS
650.0000 mg | ORAL_TABLET | ORAL | Status: DC | PRN
  Administered 2012-03-13: 650 mg via ORAL
  Filled 2012-03-11: qty 2

## 2012-03-11 MED ORDER — ERYTHROMYCIN BASE 250 MG PO TABS
250.0000 mg | ORAL_TABLET | Freq: Four times a day (QID) | ORAL | Status: DC
  Administered 2012-03-13 – 2012-03-17 (×15): 250 mg via ORAL
  Filled 2012-03-11 (×20): qty 1

## 2012-03-11 NOTE — H&P (Signed)
History     CSN: [id number]  Arrival date and time: 03/11/12 1256   None     Chief Complaint  Patient presents with  . Vaginal Discharge   HPI Diana Blair is a 31 y.o. G1P0 female at [redacted]w[redacted]d by LMP that correlates w/ 2nd trimester u/s @ 16wks, who presents w/ report of leakage of clear fluid last pm at unspecified time.  Reports good fm. Denies VB, uc's/cramping/pressure.  Initiated pnc @ [facility] at 18wk4d, has had 2 visits- borderline htn, pih labs were wnl.  Urine p/c ratio/24hr urine not done.  Declined genetic screening.  Anatomy u/s @ 18wks normal but suboptimal visualization- recommended to repeat at 24wks.  Early 1hr glucola 87.  H/O PCOS w/ approximately 4 menses/yr, LNMP 6/22.   OB History    Grav Para Term Preterm Abortions TAB SAB Ect Mult Living   1         0      Past Medical History  Diagnosis Date  . Seizure   . Diverticulitis   . PCOS (polycystic ovarian syndrome)     Past Surgical History  Procedure Date  . No past surgeries     Family History  Problem Relation Age of Onset  . Other Neg Hx   . Cancer Mother     lung  . Diabetes Father     History  Substance Use Topics  . Smoking status: Former Smoker    Types: Cigarettes    Quit date: 02/08/2009  . Smokeless tobacco: Never Used  . Alcohol Use: No    Allergies: No Known Allergies  Prescriptions prior to admission  Medication Sig Dispense Refill  . Prenatal Vit-Fe Fumarate-FA (PRENATAL MULTIVITAMIN) TABS Take 1 tablet by mouth daily.        Review of Systems  Constitutional: Negative.   HENT: Negative.   Eyes: Negative.   Respiratory: Negative.   Cardiovascular: Negative.   Gastrointestinal: Negative.   Genitourinary: Negative.   Musculoskeletal: Negative.   Skin: Negative.   Neurological: Negative.   Endo/Heme/Allergies: Negative.   Psychiatric/Behavioral: Negative.    Physical Exam   Blood pressure 111/45, pulse 96, temperature 98.1 F (36.7 C), temperature source Oral,  resp. rate 22, last menstrual period 10/16/2011.  Physical Exam  Constitutional: She appears well-developed and well-nourished.  HENT:  Head: Normocephalic.  Neck: Normal range of motion.  Cardiovascular: Normal rate and regular rhythm.   Respiratory: Effort normal.  GI: Soft.       Gravid, fundus U+1  Genitourinary: Vagina normal and uterus normal.       Spec exam: cervix visually opened to about 3cm w/ bulging membranes. No obvious pooling of fluid, but moderate-large amount thin white frothy malodorous d/c present.  Wet prep and slide for fern obtained.  Areas of +ferning on slide.  Amnisure obtained to confirm- was neg.  SVE: deferred  Spec exam by Dr. Penne Lash: same as above w/ addition of +pooling of fluid.  Gc/ct and gbs pcr obtained   FHT: 147 via doppler MAU Course  Procedures  Doppler FHT Spec exam x 2, wet prep, gc/ct, gbs pcr Crist Fat +, Amnisure - Bedside u/s UA U/S: transverse lie, subjectively normal AFI w/ 5.6cm vertical pocket, no measurable cervix, cervix open- internal os=2.5cm, external os=1.9cm  Results for orders placed during the hospital encounter of 03/11/12 (from the past 24 hour(s))  WET PREP, GENITAL     Status: Abnormal   Collection Time   03/11/12  1:45 PM  Component Value Range   Yeast Wet Prep HPF POC NONE SEEN  NONE SEEN   Trich, Wet Prep NONE SEEN  NONE SEEN   Clue Cells Wet Prep HPF POC FEW (*) NONE SEEN   WBC, Wet Prep HPF POC FEW (*) NONE SEEN  AMNISURE RUPTURE OF MEMBRANE (ROM)     Status: Normal   Collection Time   03/11/12  2:05 PM      Component Value Range   Amnisure ROM NEGATIVE    GROUP B STREP BY PCR     Status: Normal   Collection Time   03/11/12  3:39 PM      Component Value Range   Group B strep by PCR NEGATIVE  NEGATIVE  CBC     Status: Abnormal   Collection Time   03/11/12  4:32 PM      Component Value Range   WBC 10.5  4.0 - 10.5 K/uL   RBC 4.07  3.87 - 5.11 MIL/uL   Hemoglobin 11.0 (*) 12.0 - 15.0 g/dL   HCT  65.7 (*) 84.6 - 46.0 %   MCV 79.9  78.0 - 100.0 fL   MCH 27.0  26.0 - 34.0 pg   MCHC 33.8  30.0 - 36.0 g/dL   RDW 96.2 (*) 95.2 - 84.1 %   Platelets 200  150 - 400 K/uL  URINALYSIS, ROUTINE W REFLEX MICROSCOPIC     Status: Normal   Collection Time   03/11/12  4:40 PM      Component Value Range   Color, Urine YELLOW  YELLOW   APPearance CLEAR  CLEAR   Specific Gravity, Urine 1.020  1.005 - 1.030   pH 6.5  5.0 - 8.0   Glucose, UA NEGATIVE  NEGATIVE mg/dL   Hgb urine dipstick NEGATIVE  NEGATIVE   Bilirubin Urine NEGATIVE  NEGATIVE   Ketones, ur NEGATIVE  NEGATIVE mg/dL   Protein, ur NEGATIVE  NEGATIVE mg/dL   Urobilinogen, UA 0.2  0.0 - 1.0 mg/dL   Nitrite NEGATIVE  NEGATIVE   Leukocytes, UA NEGATIVE  NEGATIVE    Prenatal Profile: O+ Antibody neg HbsAg neg Rubella immune HIV non-reactive RPR neg GBS neg Assessment and Plan  A:  [redacted]w[redacted]d SIUP  Presumed PPROM  Incompetent cervix- app 3cm w/ BBOW  Few clues on wet prep w/ previously untreated BV- will treat  GBS neg by pcr 11/16  Morbid obesity  P:  Admit to Antenatal  Ampicillin and Erythromycin for presumed pprom  Bedrest, trendelenburg  Will repeat amnisure in am  Metronidazole 500mg  bid x 7d for BV   Discussed pt & poc w/ Dr. Garth Schlatter, [place] Every 03/11/2012, 3:00 PM    Pt seen and examined.  Speculum exam shows cervix visually dilated to 3 cm with doming membranes.  There is pooling of ferning.  Amniosure returned negative.  There was trace ferning in the periphery.  Will admit and consider as ruptured due to clinical picture (+pooling with no other source of infection).  Start Amp/Eythro.  Repeat amniosure in a.m. In case it is a false negative.  No vaginal exams.

## 2012-03-11 NOTE — MAU Provider Note (Signed)
History     CSN: [id number]  Arrival date and time: 03/11/12 1256   None     Chief Complaint  Patient presents with  . Vaginal Discharge   HPI Diana Blair is a 31 y.o. G1P0 female at [redacted]w[redacted]d by LMP that correlates w/ 2nd trimester u/s @ 16wks, who presents w/ report of leakage of clear fluid last pm at unspecified time.  Reports good fm. Denies VB, uc's/cramping/pressure.  Initiated pnc @ [facility] at 18wk4d, has had 2 visits- borderline htn, pih labs were wnl.  Urine p/c ratio/24hr urine not done.  Declined genetic screening.  Anatomy u/s @ 18wks normal but suboptimal visualization- recommended to repeat at 24wks.  Early 1hr glucola 87.  H/O PCOS w/ approximately 4 menses/yr, LNMP 6/22.   OB History    Grav Para Term Preterm Abortions TAB SAB Ect Mult Living   1         0      Past Medical History  Diagnosis Date  . Seizure   . Diverticulitis   . PCOS (polycystic ovarian syndrome)     Past Surgical History  Procedure Date  . No past surgeries     Family History  Problem Relation Age of Onset  . Other Neg Hx   . Cancer Mother     lung  . Diabetes Father     History  Substance Use Topics  . Smoking status: Former Smoker    Types: Cigarettes    Quit date: 02/08/2009  . Smokeless tobacco: Never Used  . Alcohol Use: No    Allergies: No Known Allergies  Prescriptions prior to admission  Medication Sig Dispense Refill  . Prenatal Vit-Fe Fumarate-FA (PRENATAL MULTIVITAMIN) TABS Take 1 tablet by mouth daily.        Review of Systems  Constitutional: Negative.   HENT: Negative.   Eyes: Negative.   Respiratory: Negative.   Cardiovascular: Negative.   Gastrointestinal: Negative.   Genitourinary: Negative.   Musculoskeletal: Negative.   Skin: Negative.   Neurological: Negative.   Endo/Heme/Allergies: Negative.   Psychiatric/Behavioral: Negative.    Physical Exam   Blood pressure 111/45, pulse 96, temperature 98.1 F (36.7 C), temperature source Oral, resp.  rate 22, last menstrual period 10/16/2011.  Physical Exam  Constitutional: She appears well-developed and well-nourished.  HENT:  Head: Normocephalic.  Neck: Normal range of motion.  Cardiovascular: Normal rate and regular rhythm.   Respiratory: Effort normal.  GI: Soft.       Gravid, fundus U+1  Genitourinary: Vagina normal and uterus normal.       Spec exam: cervix visually opened to about 3cm w/ bulging membranes. No obvious pooling of fluid, but moderate-large amount thin white frothy malodorous d/c present.  Wet prep and slide for fern obtained.  Areas of +ferning on slide.  Amnisure obtained to confirm- was neg.  SVE: deferred  Spec exam by Dr. Penne Lash: same as above w/ addition of +pooling of fluid.  Gc/ct and gbs pcr obtained   FHT: 147 via doppler MAU Course  Procedures  Doppler FHT Spec exam x 2, wet prep, gc/ct, gbs pcr Crist Fat +, Amnisure - Bedside u/s UA U/S: transverse lie, subjectively normal AFI w/ 5.6cm vertical pocket, no measurable cervix, cervix open- internal os=2.5cm, external os=1.9cm  Results for orders placed during the hospital encounter of 03/11/12 (from the past 24 hour(s))  WET PREP, GENITAL     Status: Abnormal   Collection Time   03/11/12  1:45 PM  Component Value Range   Yeast Wet Prep HPF POC NONE SEEN  NONE SEEN   Trich, Wet Prep NONE SEEN  NONE SEEN   Clue Cells Wet Prep HPF POC FEW (*) NONE SEEN   WBC, Wet Prep HPF POC FEW (*) NONE SEEN  AMNISURE RUPTURE OF MEMBRANE (ROM)     Status: Normal   Collection Time   03/11/12  2:05 PM      Component Value Range   Amnisure ROM NEGATIVE    GROUP B STREP BY PCR     Status: Normal   Collection Time   03/11/12  3:39 PM      Component Value Range   Group B strep by PCR NEGATIVE  NEGATIVE  CBC     Status: Abnormal   Collection Time   03/11/12  4:32 PM      Component Value Range   WBC 10.5  4.0 - 10.5 K/uL   RBC 4.07  3.87 - 5.11 MIL/uL   Hemoglobin 11.0 (*) 12.0 - 15.0 g/dL   HCT 40.3 (*)  47.4 - 46.0 %   MCV 79.9  78.0 - 100.0 fL   MCH 27.0  26.0 - 34.0 pg   MCHC 33.8  30.0 - 36.0 g/dL   RDW 25.9 (*) 56.3 - 87.5 %   Platelets 200  150 - 400 K/uL  URINALYSIS, ROUTINE W REFLEX MICROSCOPIC     Status: Normal   Collection Time   03/11/12  4:40 PM      Component Value Range   Color, Urine YELLOW  YELLOW   APPearance CLEAR  CLEAR   Specific Gravity, Urine 1.020  1.005 - 1.030   pH 6.5  5.0 - 8.0   Glucose, UA NEGATIVE  NEGATIVE mg/dL   Hgb urine dipstick NEGATIVE  NEGATIVE   Bilirubin Urine NEGATIVE  NEGATIVE   Ketones, ur NEGATIVE  NEGATIVE mg/dL   Protein, ur NEGATIVE  NEGATIVE mg/dL   Urobilinogen, UA 0.2  0.0 - 1.0 mg/dL   Nitrite NEGATIVE  NEGATIVE   Leukocytes, UA NEGATIVE  NEGATIVE     Assessment and Plan  A:  [redacted]w[redacted]d SIUP  Presumed PPROM  Incompetent cervix- app 3cm w/ BBOW  Few clues on wet prep w/ previously untreated BV- will treat  GBS neg by pcr 11/16  Morbid obesity  P:  Admit to Antenatal  Ampicillin and Erythromycin for presumed pprom  Bedrest, trendelenburg  Will repeat amnisure in am  Metronidazole 500mg  bid x 7d for BV   Discussed pt and poc w/ Dr. Garth Schlatter, [place] Every 03/11/2012, 3:00 PM

## 2012-03-11 NOTE — MAU Note (Signed)
Pt presents to MAU with chief complaint of of vaginal leaking of fluid and odorous discharge. Pt is [redacted]w[redacted]d G1

## 2012-03-11 NOTE — MAU Note (Signed)
Pt awaiting bed on [facility]- attempting to get IV started- attempt times 1 with no success. Lab at bedside now. Mary on [place] notified and will attempt and IV when patient arrives to [place].

## 2012-03-12 ENCOUNTER — Encounter (HOSPITAL_COMMUNITY): Payer: Self-pay | Admitting: Anesthesiology

## 2012-03-12 ENCOUNTER — Inpatient Hospital Stay (HOSPITAL_COMMUNITY): Payer: Medicaid Other

## 2012-03-12 DIAGNOSIS — O4290 Premature rupture of membranes, unspecified as to length of time between rupture and onset of labor, unspecified weeks of gestation: Secondary | ICD-10-CM

## 2012-03-12 NOTE — Progress Notes (Signed)
Patient ID: Diana Blair, female   DOB: Aug 25, 1980, 31 y.o.   MRN: [id number]  FACULTY PRACTICE ANTEPARTUM(COMPREHENSIVE) NOTE  Diana Blair is a 31 y.o. G1P0000 at [redacted]w[redacted]d  who is admitted for cervical incompetence and suspected rupture.   Length of Stay:  1  Days  Subjective:  Patient reports the fetal movement as active. Patient reports uterine contraction  activity as none. Patient reports  vaginal bleeding as none. Patient describes fluid per vagina as Clear.  Vitals:  Blood pressure 135/59, pulse 105, temperature 98.6 F (37 C), temperature source Oral, resp. rate 22, height 5\' 3"  (1.6 m), weight 158.305 kg (349 lb), last menstrual period 10/16/2011. Physical Examination:  Abdomen - soft, fundus non tender Extremities - Homan's sign negative bilaterally Perineum - fluid on perineum, cervical exam not done due to suspicion of rupture  Fetal Monitoring:  non FHT--fetus nonviable  Labs:  Recent Results (from the past 24 hour(s))  WET PREP, GENITAL   Collection Time   03/11/12  1:45 PM      Component Value Range   Yeast Wet Prep HPF POC NONE SEEN  NONE SEEN   Trich, Wet Prep NONE SEEN  NONE SEEN   Clue Cells Wet Prep HPF POC FEW (*) NONE SEEN   WBC, Wet Prep HPF POC FEW (*) NONE SEEN  AMNISURE RUPTURE OF MEMBRANE (ROM)   Collection Time   03/11/12  2:05 PM      Component Value Range   Amnisure ROM NEGATIVE    GROUP B STREP BY PCR   Collection Time   03/11/12  3:39 PM      Component Value Range   Group B strep by PCR NEGATIVE  NEGATIVE  CBC   Collection Time   03/11/12  4:32 PM      Component Value Range   WBC 10.5  4.0 - 10.5 K/uL   RBC 4.07  3.87 - 5.11 MIL/uL   Hemoglobin 11.0 (*) 12.0 - 15.0 g/dL   HCT 29.5 (*) 62.1 - 30.8 %   MCV 79.9  78.0 - 100.0 fL   MCH 27.0  26.0 - 34.0 pg   MCHC 33.8  30.0 - 36.0 g/dL   RDW 65.7 (*) 84.6 - 96.2 %   Platelets 200  150 - 400 K/uL  RPR   Collection Time   03/11/12  4:32 PM      Component Value Range   RPR NON  REACTIVE  NON REACTIVE  URINALYSIS, ROUTINE W REFLEX MICROSCOPIC   Collection Time   03/11/12  4:40 PM      Component Value Range   Color, Urine YELLOW  YELLOW   APPearance CLEAR  CLEAR   Specific Gravity, Urine 1.020  1.005 - 1.030   pH 6.5  5.0 - 8.0   Glucose, UA NEGATIVE  NEGATIVE mg/dL   Hgb urine dipstick NEGATIVE  NEGATIVE   Bilirubin Urine NEGATIVE  NEGATIVE   Ketones, ur NEGATIVE  NEGATIVE mg/dL   Protein, ur NEGATIVE  NEGATIVE mg/dL   Urobilinogen, UA 0.2  0.0 - 1.0 mg/dL   Nitrite NEGATIVE  NEGATIVE   Leukocytes, UA NEGATIVE  NEGATIVE  TYPE AND SCREEN   Collection Time   03/11/12  5:46 PM      Component Value Range   ABO/RH(D) O POS     Antibody Screen NEG     Sample Expiration 03/14/2012    ABO/RH   Collection Time   03/11/12  5:46 PM  Component Value Range   ABO/RH(D) O POS      Imaging Studies:    US--BPD shows 19 week 2 days as EGA.  Will discuss this with women's radiology staff as this is 2 weeks behind EGA established one month ago. Cervix shows no length and open about 3 cm  Medications:  Scheduled    . ampicillin (OMNIPEN) IV  2 g Intravenous Q6H   Followed by  . amoxicillin  500 mg Oral Q8H  . docusate sodium  100 mg Oral Daily  . erythromycin  250 mg Intravenous Q6H   Followed by  . erythromycin  250 mg Oral Q6H  . [EXPIRED] lidocaine      . metroNIDAZOLE  500 mg Oral Q12H  . prenatal multivitamin  1 tablet Oral Daily    ASSESSMENT: Patient Active Problem List  Diagnosis  . Supervision of normal first pregnancy  . Morbid obesity  . Elevated BP    PLAN: 31 yo G1P0 at 21 weeks 1 days with cervical incompetence and suspected PPROM  1-Continue Abx 2-Rpt amniosure sent now 3-Follow up on cultures 4-Have Dr. Eppie Gibson or Dr. Kyung Rudd look at US done yesterday as [facility] is 2 weeks off from an US done 02/09/12.  LEGGETT,KELLY H. 03/12/2012,6:42 AM

## 2012-03-12 NOTE — Anesthesia Preprocedure Evaluation (Addendum)
Anesthesia Evaluation  Patient identified by MRN, date of birth, ID band Patient awake    Reviewed: Allergy & Precautions, H&P , Patient's Chart, lab work & pertinent test results  Airway Mallampati: II TM Distance: >3 FB Neck ROM: full    Dental No notable dental hx. (+) Teeth Intact   Pulmonary former smoker,    Pulmonary exam normal       Cardiovascular Rhythm:regular Rate:Normal  Elevated BP's   Neuro/Psych Seizures -,  negative psych ROS   GI/Hepatic negative GI ROS, Neg liver ROS,   Endo/Other  Morbid obesity  Renal/GU negative Renal ROS  negative genitourinary   Musculoskeletal   Abdominal Normal abdominal exam  (+)   Peds  Hematology negative hematology ROS (+)   Anesthesia Other Findings   Reproductive/Obstetrics (+) Pregnancy + or - 20 weeks Incompetent Cervix                          Anesthesia Physical Anesthesia Plan  ASA: III  Anesthesia Plan: Spinal   Post-op Pain Management:    Induction:   Airway Management Planned:   Additional Equipment:   Intra-op Plan:   Post-operative Plan:   Informed Consent: I have reviewed the patients History and Physical, chart, labs and discussed the procedure including the risks, benefits and alternatives for the proposed anesthesia with the patient or authorized representative who has indicated his/her understanding and acceptance.   Dental Advisory Given  Plan Discussed with: Anesthesiologist  Anesthesia Plan Comments:         Anesthesia Quick Evaluation

## 2012-03-12 NOTE — Progress Notes (Signed)
FACULTY PRACTICE ANTEPARTUM(COMPREHENSIVE) NOTE  Diana Blair is a 31 y.o. G1P0000 at [redacted]w[redacted]d by midtrimester ultrasound who is admitted for cervical incompetence and suspected rupture due to increased vaginal discharge.  Evaluation to date shows ultrasound evidence of cervical incompetence with funnelling 2 cm wide x 3.4 cm long, noted the entire length of cervix , but Not protruding past the external os.   Amnisure has been done x 2 and shows negative results x2. Initial Crist Fat was positive, collected from the edge of an area at the edge of the clump of generous mucoid discharge.  I have repeated the speculum exam this pm. And can no longer see the bag of waters thru the thin generous mucus.Fern test is difficult to interpret, with no distinct ferning pattern, but some patterns seen that are suggestive of ferning.I call it negative. Slide kept.   Fetal presentation is cephalic.on followup ultrasound, and cervix funneling is less. Length of Stay:  1  Days  Subjective: No bleeding contractions or change in discharge Patient reports the fetal movement as active. Patient reports uterine contraction  activity as none. Patient reports  vaginal bleeding as none. Patient describes fluid per vagina as Other mucus discharge.  Vitals:  Blood pressure 123/42, pulse 89, temperature 98.9 F (37.2 C), temperature source Oral, resp. rate 20, height 5\' 3"  (1.6 m), weight 349 lb (158.305 kg), last menstrual period 10/16/2011. Physical Examination:  General appearance - alert, well appearing, and in no distress Heart - normal rate and regular rhythm Abdomen - soft, nontender, nondistended Fundal Height:  difficult to assess with pt obesity Cervical Exam: Not evaluated.  Extremities: extremities normal, atraumatic, no cyanosis or edema and Homans sign is negative, no sign of DVT with DTRs 2+ bilaterally Membranes:intact  Fetal Monitoring:  Baseline: 145 bpm  Labs:  Recent Results (from the past 24  hour(s))  AMNISURE RUPTURE OF MEMBRANE (ROM)   Collection Time   03/12/12  6:35 AM      Component Value Range   Amnisure ROM NEGATIVE      Imaging Studies:urrently EPIC will not allow sonographic studies to automatically populate into notes.  In the meantime, copy and paste results into note or free text.  Medications:  Scheduled    . ampicillin (OMNIPEN) IV  2 g Intravenous Q6H   Followed by  . amoxicillin  500 mg Oral Q8H  . docusate sodium  100 mg Oral Daily  . erythromycin  250 mg Intravenous Q6H   Followed by  . erythromycin  250 mg Oral Q6H  . [EXPIRED] lidocaine      . metroNIDAZOLE  500 mg Oral Q12H  . prenatal multivitamin  1 tablet Oral Daily   I have reviewed the patient's current medications.  ASSESSMENT: Patient Active Problem List  Diagnosis  . Supervision of normal first pregnancy  . Morbid obesity  . Elevated BP    PLAN: Mcdonald Cerclage in a.m.  Procedure, risks, potential complications reviewed in detail.  Valisa Karpel V 03/12/2012,6:00 PM

## 2012-03-12 NOTE — Progress Notes (Signed)
1419- spec exam done by dr. Sherryl Barters. Slide done for ferning.

## 2012-03-13 ENCOUNTER — Inpatient Hospital Stay (HOSPITAL_COMMUNITY): Payer: Medicaid Other | Admitting: Anesthesiology

## 2012-03-13 ENCOUNTER — Encounter (HOSPITAL_COMMUNITY): Payer: Self-pay | Admitting: Anesthesiology

## 2012-03-13 ENCOUNTER — Inpatient Hospital Stay (HOSPITAL_COMMUNITY): Payer: Medicaid Other

## 2012-03-13 ENCOUNTER — Encounter (HOSPITAL_COMMUNITY)
Admission: AD | Disposition: A | Discharge status: Home / Self Care | Payer: Self-pay | Source: Ambulatory Visit | Attending: Obstetrics & Gynecology

## 2012-03-13 HISTORY — PX: CERVICAL CERCLAGE: SHX1329

## 2012-03-13 LAB — AMNISURE RUPTURE OF MEMBRANE (ROM) NOT AT ARMC: Amnisure ROM: POSITIVE

## 2012-03-13 SURGERY — CERCLAGE, CERVIX, VAGINAL APPROACH
Anesthesia: Spinal | Site: Cervix | Wound class: Clean Contaminated

## 2012-03-13 MED ORDER — BUPIVACAINE IN DEXTROSE 0.75-8.25 % IT SOLN
INTRATHECAL | Status: DC | PRN
  Administered 2012-03-13: 1 mL via INTRATHECAL

## 2012-03-13 MED ORDER — MEPERIDINE HCL 25 MG/ML IJ SOLN: 6.2500 mg | INTRAMUSCULAR | Status: DC | PRN

## 2012-03-13 MED ORDER — CITRIC ACID-SODIUM CITRATE 334-500 MG/5ML PO SOLN
ORAL | Status: AC
  Filled 2012-03-13: qty 15

## 2012-03-13 MED ORDER — METOCLOPRAMIDE HCL 5 MG/ML IJ SOLN: 10.0000 mg | Freq: Once | INTRAMUSCULAR | Status: AC | PRN

## 2012-03-13 MED ORDER — FENTANYL CITRATE 0.05 MG/ML IJ SOLN: 25.0000 ug | INTRAMUSCULAR | Status: DC | PRN

## 2012-03-13 MED ORDER — POLYETHYLENE GLYCOL 3350 17 G PO PACK
17.0000 g | PACK | Freq: Two times a day (BID) | ORAL | Status: AC
  Filled 2012-03-13 (×4): qty 1

## 2012-03-13 SURGICAL SUPPLY — 22 items
CATH ROBINSON RED A/P 16FR (CATHETERS) ×2 IMPLANT
CLOTH BEACON ORANGE TIMEOUT ST (SAFETY) ×2 IMPLANT
COUNTER NEEDLE 1200 MAGNETIC (NEEDLE) ×1 IMPLANT
GLOVE BIO SURGEON ST LM GN SZ9 (GLOVE) ×2 IMPLANT
GLOVE BIO SURGEON STRL SZ 6.5 (GLOVE) ×2 IMPLANT
GLOVE BIO SURGEON STRL SZ7 (GLOVE) ×2 IMPLANT
GLOVE BIOGEL PI IND STRL 7.0 (GLOVE) IMPLANT
GLOVE BIOGEL PI IND STRL 9 (GLOVE) ×2 IMPLANT
GLOVE BIOGEL PI INDICATOR 7.0 (GLOVE) ×2
GLOVE BIOGEL PI INDICATOR 9 (GLOVE) ×2
GOWN PREVENTION PLUS LG XLONG (DISPOSABLE) ×2 IMPLANT
GOWN PREVENTION PLUS XLARGE (GOWN DISPOSABLE) ×2 IMPLANT
GOWN STRL REIN 3XL LVL4 (GOWN DISPOSABLE) ×2 IMPLANT
NEEDLE MAYO .5 CIRCLE (NEEDLE) ×1 IMPLANT
PACK VAGINAL MINOR WOMEN LF (CUSTOM PROCEDURE TRAY) ×2 IMPLANT
PAD OB MATERNITY 4.3X12.25 (PERSONAL CARE ITEMS) ×2 IMPLANT
PAD PREP 24X48 CUFFED NSTRL (MISCELLANEOUS) ×2 IMPLANT
SUT PROLENE 1 CT 1 30 (SUTURE) ×1 IMPLANT
TOWEL OR 17X24 6PK STRL BLUE (TOWEL DISPOSABLE) ×4 IMPLANT
TUBING NON-CON 1/4 X 20 CONN (TUBING) ×1 IMPLANT
WATER STERILE IRR 1000ML POUR (IV SOLUTION) ×2 IMPLANT
YANKAUER SUCT BULB TIP NO VENT (SUCTIONS) ×1 IMPLANT

## 2012-03-13 NOTE — Progress Notes (Signed)
Ur chart review completed.  

## 2012-03-13 NOTE — Transfer of Care (Signed)
Immediate Anesthesia Transfer of Care Note  Patient: Diana Blair  Procedure(s) Performed: Procedure(s) (LRB) with comments: CERCLAGE CERVICAL (N/A)  Patient Location: PACU  Anesthesia Type:Spinal  Level of Consciousness: awake, alert  and oriented  Airway & Oxygen Therapy: Patient Spontanous Breathing  Post-op Assessment: Report given to PACU RN and Post -op Vital signs reviewed and stable  Post vital signs: Reviewed and stable  Complications: No apparent anesthesia complications

## 2012-03-13 NOTE — Progress Notes (Signed)
FACULTY PRACTICE ANTEPARTUM(COMPREHENSIVE) NOTE  Diana Blair is a 31 y.o. G1P0000 at [redacted]w[redacted]d by midtrimester ultrasound who is admitted for cervical incompetence.   Fetal presentation is cephalic. Length of Stay:  2  Days  Subjective: Stable overnight Patient reports the fetal movement as active. Patient reports uterine contraction  activity as none. Patient reports  vaginal bleeding as none. Patient describes fluid per vagina as None.  Vitals:  Blood pressure 126/57, pulse 97, temperature 98.7 F (37.1 C), temperature source Oral, resp. rate 22, height 5\' 3"  (1.6 m), weight 349 lb (158.305 kg), last menstrual period 10/16/2011. Physical Examination:  General appearance - alert, well appearing, and in no distress and in trendelenburg,  Heart - normal rate and regular rhythm Abdomen - soft, nontender, nondistended Fundal Height:  size equals dates and exam limited by obesity Cervical Exam: Not evaluated. . Extremities: extremities normal, atraumatic, no cyanosis or edema and Homans sign is negative, no sign of DVT with DTRs 2+ bilaterally Membranes:intact  Fetal Monitoring:  140 on auscultation   Labs:  Recent Results (from the past 24 hour(s))  AMNISURE RUPTURE OF MEMBRANE (ROM)   Collection Time   03/12/12  6:35 AM      Component Value Range   Amnisure ROM NEGATIVE         . ampicillin (OMNIPEN) IV  2 g Intravenous Q6H   Followed by  . amoxicillin  500 mg Oral Q8H  . docusate sodium  100 mg Oral Daily  . erythromycin  250 mg Intravenous Q6H   Followed by  . erythromycin  250 mg Oral Q6H  . [EXPIRED] lidocaine      . metroNIDAZOLE  500 mg Oral Q12H  . prenatal multivitamin  1 tablet Oral Daily   I have reviewed the patient's current medications.  ASSESSMENT: Patient Active Problem List  Diagnosis  . Supervision of normal first pregnancy  . Morbid obesity  . Elevated BP    PLAN: Mcdonald Cerclage this am  Lama Narayanan V 03/13/2012,5:41 AM

## 2012-03-13 NOTE — Progress Notes (Signed)
Pt stating that she is feeling much better after bladder empty

## 2012-03-13 NOTE — Consult Note (Signed)
Maternal Fetal Medicine Consultation  Requesting Provider(s): Christin Bach, MD  Reason for consultation: Cervical insufficiency s/p rescue cerclage  HPI: Diana Blair is a 31 yo G1P0 currently at 50 2/7 weeks by dates who was admitted due to suspected cervical insufficiency.  She reports heavy vaginal discharge without pain/ cramping toward the end of last week.  Sterile speculum exam showed positive ferning with negative amnisure.  Admission ultrasound showed normal amniotic fluid volume with "an open cervix" with V-shaped funneling the length of the cervix.  Fetal membranes were visible on exam, but not hour-glassing into the vagina.  The patient was started on a course of IV ampicillin and Erythromycin.  Repeat sterile speculum exam this morning again showed pos ferning but amnisure was pending at the time of the procedure.  Based on previous negative amnisure, an ultrasound indicated McDonald cerclage was performed with AM without complications.  Since that time, the amnisure returned positive for ruptured membranes.  The patient denies any risk factors for incompetent cervix.  She denies vaginal bleeding or cramping.  Prenatal course has been complicated by obesity and possible chronic hypertension although the patient never previously required medications.  A baseline 24-hr urine was collected but not turned in due to hospitalization.  She is currently without complaints.  OB History: OB History    Grav Para Term Preterm Abortions TAB SAB Ect Mult Living   1 0 0 0 0 0 0 0 0 0       PMH:  Past Medical History  Diagnosis Date  . Seizure   . Diverticulitis   . PCOS (polycystic ovarian syndrome)     PSH:  Past Surgical History  Procedure Date  . No past surgeries    Meds:  Scheduled Meds:   . ampicillin (OMNIPEN) IV  2 g Intravenous Q6H   Followed by  . amoxicillin  500 mg Oral Q8H  . docusate sodium  100 mg Oral Daily  . erythromycin  250 mg Intravenous Q6H   Followed by    . erythromycin  250 mg Oral Q6H  . metroNIDAZOLE  500 mg Oral Q12H  . prenatal multivitamin  1 tablet Oral Daily   Continuous Infusions:   . lactated ringers     PRN Meds:.acetaminophen, calcium carbonate, fentaNYL, meperidine (DEMEROL) injection, metoCLOPramide, zolpidem  Allergies: NKDA  FH: Denies birth defects or hereditary disorders  Soc: Denies tobacco, ETOH or illicit drug use  Review of Systems: no vaginal bleeding or cramping/contractions,  no nausea/vomiting. All other systems reviewed and are negative.   PE:   Filed Vitals:   03/13/12 0925  BP: 117/56  Pulse: 83  Temp: 97.9 F (36.6 C)  Resp: 18    GEN: well-appearing female ABD: gravid, NT  Please see separate document for fetal ultrasound report.  Labs: CBC    Component Value Date/Time   WBC 10.5 03/11/2012 1632   RBC 4.07 03/11/2012 1632   HGB 11.0* 03/11/2012 1632   HCT 32.5* 03/11/2012 1632   PLT 200 03/11/2012 1632   MCV 79.9 03/11/2012 1632   MCH 27.0 03/11/2012 1632   MCHC 33.8 03/11/2012 1632   RDW 16.1* 03/11/2012 1632   LYMPHSABS 1.6 03/08/2012 1203   MONOABS 0.9 03/08/2012 1203   EOSABS 0.1 03/08/2012 1203   BASOSABS 0.0 03/08/2012 1203     A/P: 1) Single IUP at 21 1/7 weeks         2) Suspected cervical insufficiency s/p ultrasound indicated McDonald cerclage  3) Equivocal testing for ruptured membranes - repeat Amnisure this morning was positive for ruptured membranes         4) Morbid obesity  Recommendations: Concur with latency antibiotics- would continue IV amp/ erythro for 48 hour course and transition to po Amoxicillin/ Erythromycin for a total of 7 days of antibiotics. With cerclage stitch is place, there may be longer period of latency, but  there is a higher risk for infectious complications - would have low threshold to remove cerclage for signs/ symptoms of intraamniotic infection. Recommend discontinuing trundelenburg - may have bathroom privileges once  recovered from spinal Continue DVT prophylaxis with either pneumatic compression hose while in bed or with Lovinox.  Recommend follow up ultrasound with MFM toward the end of the week - if oligohydramnios is noted at that time, may reevaluate benefit vs. Risk of keeping cerclage in place.  If otherwise stable at that time, would consider hospital discharge with close outpatient management and possible readmission once viability reached (23-[redacted] weeks gestation).  Thank you for the opportunity to be a part of the care of [name]. Please contact our office if we can be of further assistance.   I spent approximately 30 minutes with this patient with over 50% of time spent in face-to-face counseling.  Alpha Gula, MD

## 2012-03-13 NOTE — Progress Notes (Signed)
FHR assessed on arrival to PACU.  FHR 148.  Pt tol well, will cont to monitor.

## 2012-03-13 NOTE — Op Note (Signed)
Preop: Pregnancy 21 weeks' gestation, cervical incompetency Postop: Same Procedure: McDonald cerclage single stitch Surgeon Emelda Fear Anesthesia: Spinal Complications: No intraoperative complications, however I noted an increase in vaginal mucus discharge, and collected during preparation for case for [facility] testing and Amnisure, and these tests have returned with fern test positive and this Amnisure is now positive suggesting membrane leakage. Patient still has a bulging bag of waters which is now no longer protruding into the cervix. Estimated blood loss: Minimal Details of procedure: Patient was taken to the operating room, allowed to sit upright while spinal anesthesia was placed, placed in supine position and good perineal anesthesia confirmed perineum was prepped and draped, and speculum inspection of the vagina revealed more mucus in the vagina than previously noted so Amnisure and fern test glass slide collection of fluid from the posterior vaginal vault was performed. With an open sided large Graves speculum in place the cervix was seen to be visibly {centimeter with the membranes protruding to the external os, slightly milky in color, with no visible leakage through the membranes.. Ring forceps were used to grasp the cervix edge,, and a circumferential pursestring suture of #1 Prolene was placed 2.5 cm up the outer surface of the cervix, and then cinched down while pushing the bulging bag of waters above the cervix with the use of a partially inflated Foley bulb. Foley bulb was removed after deflation, and the cervix was cinched down to where a ring forceps could be barely passed through the internal os. Good cervical length was accomplished. No leakage of fluid was noted during the procedure. Patient then was allowed to go to recovery room in good condition,. Sponge and needle counts were correct. The fern test was then inspected and did show ferning characteristic of membrane rupture. Amnisure  subsequently returned as positive. Patient was returned to the antenatal, to continue IV antibiotics with maternal-fetal medicine consult requested.

## 2012-03-13 NOTE — Anesthesia Postprocedure Evaluation (Signed)
  Anesthesia Post-op Note  Patient: Diana Blair  Procedure(s) Performed: Procedure(s) (LRB) with comments: CERCLAGE CERVICAL (N/A)  Patient Location: PACU  Anesthesia Type:Spinal  Level of Consciousness: awake, alert  and oriented  Airway and Oxygen Therapy: Patient Spontanous Breathing  Post-op Pain: none  Post-op Assessment: Post-op Vital signs reviewed, Patient's Cardiovascular Status Stable, Respiratory Function Stable, Patent Airway, No signs of Nausea or vomiting, Pain level controlled, No headache, No backache, No residual numbness and No residual motor weakness  Post-op Vital Signs: Reviewed and stable  Complications: No apparent anesthesia complications

## 2012-03-13 NOTE — Anesthesia Procedure Notes (Signed)
Spinal  Patient location during procedure: OR Start time: 03/13/2012 7:15 AM Staffing Anesthesiologist: Mako Pelfrey A. Performed by: anesthesiologist  Preanesthetic Checklist Completed: patient identified, site marked, surgical consent, pre-op evaluation, timeout performed, IV checked, risks and benefits discussed and monitors and equipment checked Spinal Block Patient position: sitting Prep: site prepped and draped and DuraPrep Patient monitoring: heart rate, cardiac monitor, continuous pulse ox and blood pressure Approach: midline Location: L3-4 Injection technique: single-shot Needle Needle type: Sprotte  Needle gauge: 24 G Needle length: 9 cm Needle insertion depth: 9 cm Assessment Sensory level: T10 Additional Notes Patient tolerated procedure well. Adequate sensory level.

## 2012-03-13 NOTE — Progress Notes (Signed)
Into pt room and getting ready to do i/o cath when pt starts emptying her bladder in the bed a large amt of urine.  Bed linens changed.

## 2012-03-13 NOTE — Progress Notes (Signed)
MD notified that pt c/o pressure and unable to void due to pt still having a hard time moving in the bed from the spinal.  Pt unable to get on the bedpan. Per MD please do I/O cath not indwelling.

## 2012-03-14 ENCOUNTER — Encounter (HOSPITAL_COMMUNITY): Payer: Self-pay | Admitting: Obstetrics and Gynecology

## 2012-03-14 NOTE — Progress Notes (Signed)
03/14/12 1400  Clinical Encounter Type  Visited With Patient  Visit Type Follow-up;Spiritual support;Social support  Recommendations Spiritual Care will follow for support.  Spiritual Encounters  Spiritual Needs Emotional;Literature  Stress Factors  Patient Stress Factors (worried about missing prenatal education opportunities)    Made lengthy (>1 hour) follow-up visit with Diana Blair, who was appreciative of opportunity to share about the loss of her mother in May, her story of learning she was pregnant (at 16 weeks), and other life milestones.  She is particularly concerned about missing prenatal education opportunities (birth/baby class, breastfeeding/lactation education, etc) and welcomes any additional information the hospital can provide.  I let her bedside RN Diana Blair know this, too.  Provided pastoral listening and reflection, witness to Diana Blair's story, and literature (magazines and inspirational booklets) per pt request.  Diana Blair is aware of ongoing chaplain availability.  Spiritual Care will follow for support.  [address] [place], [place] [phone]

## 2012-03-14 NOTE — Progress Notes (Signed)
1 Day Post-Op Procedure(s) (LRB): CERCLAGE CERVICAL (N/A) Pt is PROM at 21 weeks and 3/7 days.  Beign followed by MFM    Subjective: Patient reports tolerating PO, + BM and no problems voiding.  No pain at present.  No bleeding overnight.  Objective: I have reviewed patient's vital signs, intake and output, medications and labs.  General: alert, cooperative and mild distress GI: soft, non-tender; bowel sounds normal; no masses,  no organomegaly obeses  Assessment: s/p Procedure(s) (LRB) with comments: CERCLAGE CERVICAL (N/A): stable  Plan: discuseed with patient and partner in detail the risks of early ROM and the risk of continued nonviablility.  They both expressed understanding. Cont atbx for now. Bedrest Consider Lovenox for DVT prophylaxis  LOS: 3 days    Blair, Diana Schoon 03/14/2012, 8:32 AM

## 2012-03-14 NOTE — Progress Notes (Signed)
03/14/12 1100  Clinical Encounter Type  Visited With Patient (two visitors)  Visit Type Initial  Referral From Nurse    Made initial contact with pt, per RN referral, while she had two visitors; we plan to follow up around 1:00 pm if schedules allow (or tomorrow if need be) to get further acquainted when company has gone.  [address] [place], [place] [phone]

## 2012-03-15 ENCOUNTER — Inpatient Hospital Stay (HOSPITAL_COMMUNITY): Payer: Medicaid Other

## 2012-03-15 MED ORDER — SIMETHICONE 80 MG PO CHEW
80.0000 mg | CHEWABLE_TABLET | Freq: Three times a day (TID) | ORAL | Status: DC
  Administered 2012-03-15 – 2012-03-16 (×6): 80 mg via ORAL

## 2012-03-15 MED ORDER — ENOXAPARIN SODIUM 80 MG/0.8ML ~~LOC~~ SOLN
80.0000 mg | SUBCUTANEOUS | Status: DC
  Administered 2012-03-15 – 2012-03-16 (×2): 80 mg via SUBCUTANEOUS
  Filled 2012-03-15 (×3): qty 0.8

## 2012-03-15 NOTE — Progress Notes (Signed)
03/15/12 1000  Clinical Encounter Type  Visited With Patient and family together (Husband)  Visit Type Follow-up;Spiritual support;Social support  Spiritual Encounters  Spiritual Needs Emotional    Followed up with Diana Blair this morning, as her husband requested yesterday.  She's working hard to balance anxieties about possible outcomes with the deep hope that baby will be safe.  Provided opportunity for further sharing and emotional processing.  Will continue to follow for support.  [address] [place], [place] [phone]

## 2012-03-15 NOTE — Progress Notes (Signed)
2 Days Post-Op Procedure(s) (LRB): CERCLAGE CERVICAL (N/A)  Subjective: Patient reports + flatus and + BM. She denies vaginal bleeding or loss of fluid (only mucous).   Objective: I have reviewed patient's vital signs, intake and output, medications and labs.  General: alert Resp: clear to auscultation bilaterally Cardio: regular rate and rhythm, S1, S2 normal, no murmur, click, rub or gallop GI: soft, non-tender; bowel sounds normal; no masses,  no organomegaly Extremities: extremities normal, atraumatic, no cyanosis or edema  Assessment: s/p Procedure(s) (LRB) with comments: CERCLAGE CERVICAL (N/A): stable  Plan: Lovenox to prevent DVTs in this high risk patient.  LOS: 4 days    Diana Blair C. 03/15/2012, 6:38 AM

## 2012-03-16 ENCOUNTER — Inpatient Hospital Stay (HOSPITAL_COMMUNITY)
Admit: 2012-03-16 | Discharge: 2012-03-16 | Disposition: A | Discharge status: Home / Self Care | Payer: Medicaid Other | Attending: Obstetrics & Gynecology | Admitting: Obstetrics & Gynecology

## 2012-03-16 DIAGNOSIS — O42919 Preterm premature rupture of membranes, unspecified as to length of time between rupture and onset of labor, unspecified trimester: Secondary | ICD-10-CM | POA: Diagnosis not present

## 2012-03-16 NOTE — Progress Notes (Signed)
Pt to MFM 

## 2012-03-16 NOTE — Progress Notes (Signed)
Perinatal  Education brought by [employer] for pt. Pt asleep at this time.

## 2012-03-16 NOTE — Progress Notes (Signed)
[facility] ultrasound  Indication: 31 yr old G1P0 at [redacted]w[redacted]d with previable rupture of membranes and shortened cervix s/p cerclage for follow up ultrasound.  Findings: 1. Single intrauterine pregnancy. 2. Posterior placenta without evidence of previa. 3. Amniotic fluid volume appears decreased. 4. The cervix is not well visualized. 5. The limited anatomy survey is normal.  Recommendations: 1. Previable rupture of membranes with cerclage placed after rupture of membranes: - previously counseled - see consult note 2. Patient continues to have limited anatomy survey: views of ductal arch and right ventricular outflow tract are limited - recommend reevaluate fetal anatomy on follow up ultrasound 3. Recommend follow up ultrasound for fetal growth around 24 weeks or when patient would want full intervention to obtain a fetal weight  Eulis Foster, MD

## 2012-03-16 NOTE — Consult Note (Signed)
[facility]  Neonatal Medicine Consultation       03/16/2012    11:02 PM  I was called at the request of the patient's obstetrician (Dr. Penne Lash) to speak to this patient due to suspected PROM at 21 5/7 weeks.  The patient is currently at bedrest.  I had a long discussion with her and her partner lasting about 45 minutes.  I reviewed mortality and morbidity statistics for the various gestational ages from 14 to 56 weeks.  We have not had any survivals at < 23 weeks, although around the [place] there have been a small percentage (most with moderate-severe or worse neurodevelopmental disability).  If she can keep the pregnancy beyond 23 weeks, there is an increasing chance of survival.  Generally 23 week births have about 20-30% chance of survival, however only about 10% of 23-week babies survive without significant disability.  For 24 weekers, survival rises to closer to 50%, or about 40% survival without significant disability.  And for 25 weekers, survival is about 75% or better, or 50-60% survival without significant disability.  I tried to given these parents a basic idea of this concept.  Before 23 weeks, the baby has no chance of survival at this time.  Beyond that age, survival with steadily improve and disability risk will steadily decline.    I reviewed expected problems--RDS, chronic lung disease, infection, feeding, IVH, breast feeding (which I recommended, and mom plans to do), expected length of stay.  I got a multitude of questions from the these parents, especially regarding OB issues (which I kept referring them back to their obstetrician for an answer).  Especially from the father.  They are very committed to this baby, and express wanting to do anything and everything to help the child, even right now while undelivered.  So we will expect to be called to delivery if mom gets close to 23 weeks and beyond.  _____________________ Electronically Signed By: Angelita Ingles, MD Neonatologist

## 2012-03-16 NOTE — Progress Notes (Signed)
Patient ID: Diana Blair, female   DOB: 1981/04/10, 31 y.o.   MRN: [id number] 3 Days Post-Op Procedure(s) (LRB): CERCLAGE CERVICAL (N/A)  Subjective: Patient reports + flatus and + BM.  No bleeding or gushes of watery fluid  Objective: I have reviewed patient's vital signs, intake and output, medications and labs. Filed Vitals:   03/15/12 1442 03/15/12 1659 03/15/12 2044 03/16/12 0028  BP:  121/50 108/46 123/53  Pulse:  83 97 86  Temp:  98.7 F (37.1 C) 98.6 F (37 C) 98.2 F (36.8 C)  TempSrc:  Oral Oral Oral  Resp:   20 20  Height:      Weight: 351 lb 11.2 oz (159.53 kg)     SpO2:  100%      General: alert Resp: clear to auscultation bilaterally Cardio: regular rate and rhythm, S1, S2 normal, no murmur, click, rub or gallop GI: soft, non-tender; bowel sounds normal; no masses,  no organomegaly Extremities: extremities normal, atraumatic, no cyanosis or edema  Assessment: s/p Procedure(s) (LRB) with comments: CERCLAGE CERVICAL (N/A): stable Questionable ROM Plan: Lovenox to prevent DVTs in this high risk patient. MFM to assess if truly has rupture of membranes or just false positive amniosure  LOS: 5 days    EURE,LUTHER H 03/16/2012, 7:29 AM

## 2012-03-16 NOTE — Consult Note (Signed)
MFM consult  31 yr old G1P0 at [redacted]w[redacted]d with previable rupture of membranes and shortened cervix s/p cerclage referred by Dr. Penne Lash for follow up ultrasound and consult.  Ultrasound today shows: Posterior placenta without evidence of previa.Amniotic fluid volume appears decreased. The cervix is not well visualized.The limited anatomy survey is normal.  I counseled the patient as follows:   1. Previable rupture of membranes with cerclage placed after rupture of membranes:  - previously counseled  - I reiterated risks of preterm or previable delivery, risk of preterm labor, and risk of chorioamnionitis - discussed it is difficult to predict latency period however studies have shown that if a patient with previable PPROM has not delivered in the first week there is a significant chance to maintain pregnancy for several weeks - discussed would recommend delivery for signs of labor or chorioamnionitis - previously counseled regarding cerclage- I agree at this point would recommend keeping cerclage in place as it may prolong latency, however there is an increased risk of infection- would recommend continued inpatient management and close surveillance for the development of signs/symptoms of chorioamnionitis - recommend patient meet with Neonatology to discuss fetal outcomes at these periviable gestational ages - briefly discussed survival rates and significant rates of morbidity for fetuses born at 18-24 weeks. Discussed <23 weeks is generally not considered viable - would encourage patient and her husband based on data given by myself and NICU to decide at what gestational age they would want intervention for the fetus and at that time would give course of betamethasone; also discussed that fetus is in breech presentation and there are increased risks for morbidity and mortality for a fetus <1500g to deliver breech. Discussed option of C section and risks associated and encourage patient to decide:  - what gestational age they would want neonatal resuscitation      - what gestational age they would want C section for distress and/or labor- patient will need to be counseled on possibility of classical C section and risks for future pregnancies      - discussed prior to 23 weeks would have vaginal delivery regardless as fetus is nonviable  - when patient would want neonatal resuscitation recommend course of betamethasone and if delivery were imminent recommend course of magnesium sulfate for neuroprotection - when patient would want C section for distress recommend fetal monitoring TID or longer based on fetal tracing - if there are any signs/symptoms of contractions, labor, or infection recommend removal of cerclage immediately and recommend delivery for labor or chorioamnionitis 2. Patient continues to have limited anatomy survey: views of ductal arch and right ventricular outflow tract are limited  - recommend reevaluate fetal anatomy on follow up ultrasound  3. Recommend follow up ultrasound for fetal growth around 24 weeks or when patient would want full intervention to obtain a fetal weight    I spent 60 minutes in face to face consultation with the patient in addition to time spent on the ultrasound.  Results given to Dr. Lovie Chol, MD

## 2012-03-17 ENCOUNTER — Encounter (HOSPITAL_COMMUNITY): Payer: Self-pay | Admitting: Anesthesiology

## 2012-03-17 ENCOUNTER — Encounter (HOSPITAL_COMMUNITY)
Admission: AD | Disposition: A | Discharge status: Home / Self Care | Payer: Self-pay | Source: Ambulatory Visit | Attending: Obstetrics & Gynecology

## 2012-03-17 ENCOUNTER — Encounter (HOSPITAL_COMMUNITY): Payer: Self-pay | Admitting: *Deleted

## 2012-03-17 ENCOUNTER — Inpatient Hospital Stay (HOSPITAL_COMMUNITY): Payer: Medicaid Other | Admitting: Anesthesiology

## 2012-03-17 ENCOUNTER — Inpatient Hospital Stay (HOSPITAL_COMMUNITY): Payer: Medicaid Other

## 2012-03-17 ENCOUNTER — Ambulatory Visit (HOSPITAL_COMMUNITY): Payer: Medicaid Other

## 2012-03-17 DIAGNOSIS — O411290 Chorioamnionitis, unspecified trimester, not applicable or unspecified: Secondary | ICD-10-CM | POA: Diagnosis not present

## 2012-03-17 HISTORY — PX: CERVICAL CERCLAGE: SHX1329

## 2012-03-17 LAB — CBC
MCH: 26.5 pg (ref 26.0–34.0)
MCHC: 32.9 g/dL (ref 30.0–36.0)
MCV: 80.5 fL (ref 78.0–100.0)
Platelets: 200 10*3/uL (ref 150–400)
RDW: 16 % — ABNORMAL HIGH (ref 11.5–15.5)

## 2012-03-17 LAB — DIFFERENTIAL
Basophils Absolute: 0 10*3/uL (ref 0.0–0.1)
Basophils Relative: 0 % (ref 0–1)
Eosinophils Absolute: 0 10*3/uL (ref 0.0–0.7)
Eosinophils Relative: 0 % (ref 0–5)
Neutrophils Relative %: 79 % — ABNORMAL HIGH (ref 43–77)

## 2012-03-17 SURGERY — CERCLAGE, CERVIX, VAGINAL APPROACH
Anesthesia: Spinal | Site: Cervix

## 2012-03-17 SURGERY — DILATION AND CURETTAGE
Anesthesia: Monitor Anesthesia Care | Wound class: Clean Contaminated

## 2012-03-17 MED ORDER — IBUPROFEN 600 MG PO TABS: 600.0000 mg | ORAL_TABLET | Freq: Four times a day (QID) | ORAL | Status: DC | PRN

## 2012-03-17 MED ORDER — OXYCODONE-ACETAMINOPHEN 5-325 MG PO TABS: 1.0000 | ORAL_TABLET | ORAL | Status: DC | PRN

## 2012-03-17 MED ORDER — LACTATED RINGERS IV SOLN: 500.0000 mL | INTRAVENOUS | Status: DC | PRN

## 2012-03-17 MED ORDER — ONDANSETRON HCL 4 MG/2ML IJ SOLN: 4.0000 mg | INTRAMUSCULAR | Status: DC | PRN

## 2012-03-17 MED ORDER — BUPIVACAINE HCL (PF) 0.5 % IJ SOLN
INTRAMUSCULAR | Status: AC
  Filled 2012-03-17: qty 30

## 2012-03-17 MED ORDER — HYDROXYZINE HCL 50 MG PO TABS: 50.0000 mg | ORAL_TABLET | Freq: Four times a day (QID) | ORAL | Status: DC | PRN

## 2012-03-17 MED ORDER — FAMOTIDINE 20 MG PO TABS
20.0000 mg | ORAL_TABLET | Freq: Once | ORAL | Status: DC
  Filled 2012-03-17: qty 1

## 2012-03-17 MED ORDER — FENTANYL CITRATE 0.05 MG/ML IJ SOLN: 50.0000 ug | INTRAMUSCULAR | Status: DC | PRN

## 2012-03-17 MED ORDER — ZOLPIDEM TARTRATE 5 MG PO TABS: 5.0000 mg | ORAL_TABLET | Freq: Every evening | ORAL | Status: DC | PRN

## 2012-03-17 MED ORDER — ACETAMINOPHEN 325 MG PO TABS: 650.0000 mg | ORAL_TABLET | ORAL | Status: DC | PRN

## 2012-03-17 MED ORDER — OXYTOCIN 40 UNITS IN LACTATED RINGERS INFUSION - SIMPLE MED
62.5000 mL/h | INTRAVENOUS | Status: DC
  Administered 2012-03-17: 500 mL/h via INTRAVENOUS
  Filled 2012-03-17 (×2): qty 1000

## 2012-03-17 MED ORDER — SIMETHICONE 80 MG PO CHEW
80.0000 mg | CHEWABLE_TABLET | ORAL | Status: DC | PRN
  Administered 2012-03-18: 80 mg via ORAL

## 2012-03-17 MED ORDER — LACTATED RINGERS IV SOLN
INTRAVENOUS | Status: DC | PRN
  Administered 2012-03-17: 12:00:00 via INTRAVENOUS

## 2012-03-17 MED ORDER — BENZOCAINE-MENTHOL 20-0.5 % EX AERO: 1.0000 "application " | INHALATION_SPRAY | CUTANEOUS | Status: DC | PRN

## 2012-03-17 MED ORDER — BUPIVACAINE IN DEXTROSE 0.75-8.25 % IT SOLN
INTRATHECAL | Status: DC | PRN
  Administered 2012-03-17: 9 mg via INTRATHECAL

## 2012-03-17 MED ORDER — MISOPROSTOL 200 MCG PO TABS
600.0000 ug | ORAL_TABLET | Freq: Once | ORAL | Status: AC
  Administered 2012-03-17: 600 ug via ORAL
  Filled 2012-03-17: qty 3

## 2012-03-17 MED ORDER — PIPERACILLIN-TAZOBACTAM 3.375 G IVPB
3.3750 g | Freq: Three times a day (TID) | INTRAVENOUS | Status: DC
  Administered 2012-03-17 – 2012-03-18 (×3): 3.375 g via INTRAVENOUS
  Filled 2012-03-17 (×4): qty 50

## 2012-03-17 MED ORDER — FLEET ENEMA 7-19 GM/118ML RE ENEM: 1.0000 | ENEMA | RECTAL | Status: DC | PRN

## 2012-03-17 MED ORDER — IBUPROFEN 600 MG PO TABS
600.0000 mg | ORAL_TABLET | Freq: Four times a day (QID) | ORAL | Status: DC
  Administered 2012-03-17 – 2012-03-19 (×5): 600 mg via ORAL
  Filled 2012-03-17 (×5): qty 1

## 2012-03-17 MED ORDER — MISOPROSTOL 200 MCG PO TABS
400.0000 ug | ORAL_TABLET | ORAL | Status: DC
  Administered 2012-03-17: 400 ug via ORAL
  Filled 2012-03-17: qty 2

## 2012-03-17 MED ORDER — CITRIC ACID-SODIUM CITRATE 334-500 MG/5ML PO SOLN
30.0000 mL | ORAL | Status: DC | PRN
  Administered 2012-03-17: 30 mL via ORAL
  Filled 2012-03-17: qty 15

## 2012-03-17 MED ORDER — DIPHENHYDRAMINE HCL 25 MG PO CAPS: 25.0000 mg | ORAL_CAPSULE | Freq: Four times a day (QID) | ORAL | Status: DC | PRN

## 2012-03-17 MED ORDER — LACTATED RINGERS IV SOLN
INTRAVENOUS | Status: DC
  Administered 2012-03-17: 125 mL/h via INTRAVENOUS

## 2012-03-17 MED ORDER — OXYTOCIN 10 UNIT/ML IJ SOLN
INTRAMUSCULAR | Status: AC
  Administered 2012-03-17: 10 [IU]
  Filled 2012-03-17: qty 2

## 2012-03-17 MED ORDER — LACTATED RINGERS IV SOLN
INTRAVENOUS | Status: DC
  Administered 2012-03-17: 12:00:00 via INTRAVENOUS

## 2012-03-17 MED ORDER — OXYTOCIN 10 UNIT/ML IJ SOLN: 10.0000 [IU] | Freq: Once | INTRAMUSCULAR | Status: DC

## 2012-03-17 MED ORDER — FENTANYL CITRATE 0.05 MG/ML IJ SOLN
100.0000 ug | INTRAMUSCULAR | Status: DC | PRN
  Administered 2012-03-17 (×2): 100 ug via INTRAVENOUS
  Filled 2012-03-17 (×2): qty 2

## 2012-03-17 MED ORDER — ONDANSETRON HCL 4 MG/2ML IJ SOLN: 4.0000 mg | Freq: Four times a day (QID) | INTRAMUSCULAR | Status: DC | PRN

## 2012-03-17 MED ORDER — FENTANYL CITRATE 0.05 MG/ML IJ SOLN
INTRAMUSCULAR | Status: DC | PRN
  Administered 2012-03-17: 100 ug via INTRAVENOUS

## 2012-03-17 MED ORDER — SODIUM CHLORIDE 0.9 % IJ SOLN: 10.0000 mL | Freq: Two times a day (BID) | INTRAMUSCULAR | Status: DC

## 2012-03-17 MED ORDER — SENNOSIDES-DOCUSATE SODIUM 8.6-50 MG PO TABS
2.0000 | ORAL_TABLET | Freq: Every day | ORAL | Status: DC
  Administered 2012-03-17 – 2012-03-18 (×2): 2 via ORAL

## 2012-03-17 MED ORDER — LIDOCAINE HCL (PF) 1 % IJ SOLN
INTRAMUSCULAR | Status: AC
  Filled 2012-03-17: qty 30

## 2012-03-17 MED ORDER — SODIUM CHLORIDE 0.9 % IJ SOLN: 10.0000 mL | INTRAMUSCULAR | Status: DC | PRN

## 2012-03-17 MED ORDER — HYDROXYZINE HCL 50 MG/ML IM SOLN
50.0000 mg | Freq: Four times a day (QID) | INTRAMUSCULAR | Status: DC | PRN
  Filled 2012-03-17: qty 1

## 2012-03-17 MED ORDER — MIDAZOLAM HCL 5 MG/5ML IJ SOLN
INTRAMUSCULAR | Status: DC | PRN
  Administered 2012-03-17: 2 mg via INTRAVENOUS

## 2012-03-17 MED ORDER — ONDANSETRON HCL 4 MG PO TABS: 4.0000 mg | ORAL_TABLET | ORAL | Status: DC | PRN

## 2012-03-17 MED ORDER — OXYTOCIN BOLUS FROM INFUSION: 500.0000 mL | INTRAVENOUS | Status: DC

## 2012-03-17 MED ORDER — MIDAZOLAM HCL 2 MG/2ML IJ SOLN
INTRAMUSCULAR | Status: AC
  Filled 2012-03-17: qty 2

## 2012-03-17 MED ORDER — FENTANYL CITRATE 0.05 MG/ML IJ SOLN
INTRAMUSCULAR | Status: AC
  Filled 2012-03-17: qty 2

## 2012-03-17 SURGICAL SUPPLY — 6 items
GOWN STRL REIN XL XLG (GOWN DISPOSABLE) ×4 IMPLANT
PACK VAGINAL MINOR WOMEN LF (CUSTOM PROCEDURE TRAY) ×2 IMPLANT
PAD OB MATERNITY 4.3X12.25 (PERSONAL CARE ITEMS) ×2 IMPLANT
PAD PREP 24X48 CUFFED NSTRL (MISCELLANEOUS) ×2 IMPLANT
TOWEL OR 17X24 6PK STRL BLUE (TOWEL DISPOSABLE) ×3 IMPLANT
WATER STERILE IRR 1000ML POUR (IV SOLUTION) ×2 IMPLANT

## 2012-03-17 SURGICAL SUPPLY — 17 items
CATH ROBINSON RED A/P 16FR (CATHETERS) ×1 IMPLANT
CLOTH BEACON ORANGE TIMEOUT ST (SAFETY) ×1 IMPLANT
CONTAINER PREFILL 10% NBF 60ML (FORM) ×2 IMPLANT
DECANTER SPIKE VIAL GLASS SM (MISCELLANEOUS) ×1 IMPLANT
DRESSING TELFA 8X3 (GAUZE/BANDAGES/DRESSINGS) ×1 IMPLANT
GLOVE BIO SURGEON STRL SZ7 (GLOVE) ×1 IMPLANT
GLOVE BIOGEL PI IND STRL 7.0 (GLOVE) ×2 IMPLANT
GLOVE BIOGEL PI INDICATOR 7.0 (GLOVE)
GOWN STRL REIN XL XLG (GOWN DISPOSABLE) ×2 IMPLANT
NDL SPNL 22GX3.5 QUINCKE BK (NEEDLE) ×1 IMPLANT
NEEDLE SPNL 22GX3.5 QUINCKE BK (NEEDLE) IMPLANT
PACK VAGINAL MINOR WOMEN LF (CUSTOM PROCEDURE TRAY) ×1 IMPLANT
PAD OB MATERNITY 4.3X12.25 (PERSONAL CARE ITEMS) ×1 IMPLANT
PAD PREP 24X48 CUFFED NSTRL (MISCELLANEOUS) ×1 IMPLANT
SYR CONTROL 10ML LL (SYRINGE) ×1 IMPLANT
TOWEL OR 17X24 6PK STRL BLUE (TOWEL DISPOSABLE) ×2 IMPLANT
WATER STERILE IRR 1000ML POUR (IV SOLUTION) ×1 IMPLANT

## 2012-03-17 NOTE — Op Note (Signed)
Diana Blair PROCEDURE DATE: 03/17/2012  PREOPERATIVE DIAGNOSIS: Intrauterine pregnancy at [redacted]w[redacted]d; s/p cerclage placement; PPROM with particulate meconium, morbid obesity POSTOPERATIVE DIAGNOSIS: Intrauterine pregnancy at [redacted]w[redacted]d, morbid obesity, s/p cerclage removal; PPROM complicated by chorioamnionitis; yellow, purulent drainage noted from uterus PROCEDURE: Transvaginal cerclage removal SURGEON:  Dr. Jaynie Collins  INDICATIONS: 31 y.o. G1P0 with IUD at [redacted]w[redacted]d, PPROM and cerclage in place, now needing cerclage removal due to meconium being seen and possible purulent discharge.  Risks of surgery were discussed with the patient including but not limited to: bleeding, infection, injury to surrounding organs, need for additional procedures, and other postoperative/anesthesia complications. Written informed consent was obtained.    FINDINGS:  Copious amount of yellow, purulent drainage noted from uterus.  Cerclage noted and removed successfully.  Patient examined and found to be 4 cm dilated, 50% effacement, -3 station.  Unable to appreciably palpate fetus parts.  ANESTHESIA:    Spinal INTRAVENOUS FLUIDS: 50  ml ESTIMATED BLOOD LOSS: Minimal COMPLICATIONS: None immediate  PROCEDURE IN DETAIL:  The patient had sequential compression devices applied to her lower extremities and was then taken to the operating room where spinal anesthesia was administered and was found to be adequate.  She was placed in the dorsal lithotomy position.   After an adequate timeout was performed, attention was turned to her pelvic and a vaginal speculum was placed and copious amount of yellow, purulent drainage was noted. Her cervical cerclage was seen, and was cut on the right side under the knot. The entire cerclage was removed.  Patient had minimal bleeding. Speculum was removed, patient was taken out of lithotomy position.  She tolerated procedure well, and was sent to PACU for recovery.  After recovery she will be sent  to L&D for induction of labor secondary to chorioamnionitis and started on antibiotics. Patient agrees with this plan.

## 2012-03-17 NOTE — Progress Notes (Signed)
ANTIBIOTIC CONSULT NOTE - INITIAL  Pharmacy Consult for Zosyn Indication: Chorioamnionitis  No Known Allergies  Patient Measurements: Height: 5\' 3"  (160 cm) Weight: 351 lb 11.2 oz (159.53 kg) IBW/kg (Calculated) : 52.4    Vital Signs: Temp: 98.3 F (36.8 C) (11/22 1340) Temp src: Oral (11/22 1330) BP: 150/68 mmHg (11/22 1330) Pulse Rate: 85  (11/22 1340)  Labs:  Basename 03/17/12 1157  WBC 11.0*  HGB 11.0*  PLT 200  LABCREA --  CREATININE --  CRCLEARANCE --   No results found for this basename: GENTTROUGH:2,GENTPEAK:2,GENTRANDOM:2, in the last 72 hours   Microbiology: Recent Results (from the past 720 hour(s))  CULTURE, OB URINE     Status: Normal   Collection Time   03/08/12 10:50 AM      Component Value Range Status Comment   Colony Count 25,000 COLONIES/ML   Final    Organism ID, Bacteria Multiple bacterial morphotypes present, none   Final    Organism ID, Bacteria predominant. Suggest appropriate recollection if    Final    Organism ID, Bacteria clinically indicated.   Final   WET PREP, GENITAL     Status: Abnormal   Collection Time   03/11/12  1:45 PM      Component Value Range Status Comment   Yeast Wet Prep HPF POC NONE SEEN  NONE SEEN Final    Trich, Wet Prep NONE SEEN  NONE SEEN Final    Clue Cells Wet Prep HPF POC FEW (*) NONE SEEN Final    WBC, Wet Prep HPF POC FEW (*) NONE SEEN Final MODERATE BACTERIA SEEN  GROUP B STREP BY PCR     Status: Normal   Collection Time   03/11/12  3:39 PM      Component Value Range Status Comment   Group B strep by PCR NEGATIVE  NEGATIVE Final     Medications:  Amoxicillin 500 mg PO Q8 hours Erythromycin 250 mg PO Q 6 hours Metronidazole 500mg  Q 12 hours    Assessment: 31 y.o. female G1P0000 at [redacted]w[redacted]d, PPROM and cerclage.  Cerclage removed in OR due to meconium being seen and possible purulent discharge.  Patient now on L&D unit for induction of labor.  Will change antibiotics to Zosyn and metronidazole.      Plan:  Zosyn 3.375 gram IV Q 8 hours Continue metronidazole.     Isaias Sakai Scarlett 03/17/2012,2:00 PM

## 2012-03-17 NOTE — Progress Notes (Signed)
I had a brief visit with Ms Hoey in the PACU.  She was tearful as she is trying to absorb all that has happened today; but she did not wish to talk at this time.  If she would like to see a chaplain at any point later, please page [place] at [phone].  Agnes Lawrence Ameka Krigbaum 2:01 PM   03/17/12 1300  Clinical Encounter Type  Visited With Patient  Visit Type Spiritual support

## 2012-03-17 NOTE — Progress Notes (Signed)
Chaplain paged at 1851 and was in unit at 66. Chaplains have been caring for Diana Blair during the day in preparation for the delivery of Diana Blair, Diana Blair (spelling not given by pt - Pronounced NA GEE). Delivery at 1705. Diana Blair was surrounded by family. Baby father holding Diana Blair while others attended to the grieving mother. Diana Rauber';s father, Diana Blair was also in attendance.   Diana Blair is not connected to any community of faith. All spiritual care while she is in the hospital will be done by our staff and chaplains.  Grief counsel and comfort given before a prayer of grief and mourning was prayed.  Diana Blair was interested in services and funeral arrangements. She specifically said she DID NOT wish for Diana Blair to be cremated. As she was being readied to go to the 3rd floor, I went to request nurse assistance for a Social Work consult. A referral was made.   If any spiritual care is needed or requested pls page Chaplain at [phone].   Benjie Karvonen. Adonys Wildes, D.Min, APC Chaplain 8:10 PM

## 2012-03-17 NOTE — Progress Notes (Signed)
Attending Preoperative Progress Note  Patient had PICC line placed, X ray showed correct placement of PICC line.   03/17/2012  PORTABLE CHEST - 1 VIEW  RADIOLOGY REPORT  Clinical Data: Confirm PICC line placement  Comparison: 01/28/2010  Findings: Cardiomediastinal silhouette is within normal limits. There has been placement of a PICC line via a left subclavian approach and the tip is located in the distal superior vena cava in the appropriate position.  The lung fields appear clear with no signs of focal infiltrate or congestive failure.    The costophrenic angles are excluded bilaterally and evaluation for small pleural effusions is not possible.  Bony structures appear intact.  IMPRESSION: Appropriate PICC line placement as described above.   Original Report Authenticated By: Rhodia Albright, M.D.    Patient was counseled again about cerclage removal, risks of procedure discussed in detail.  Procedure scheduled for 1145, Anesthesia aware. She has been NPO since 2000 03/16/12. Written consent signed. Will go to OR soon.  Of note, patient is very angry that she was not treated for incidentally found BV after her 02/09/12 MAU visit (wet prep showed moderate clue cells but she was asymptomatic); she feels that if she were treated, then she would not have have preterm labor/PPROM. She was told there was no association of BV to PPROM; and meta-analyses of randomized trials performed in general obstetric populations have generally found that treatment of asymptomatic BV infection does not reduce the incidence of preterm labor or delivery in the overall obstetrical population.  Patient still was angry, even after I apologized for her not receiving results of the wet prep, but emphasized that she was asymptomatic at that time and that BV has not been shown to be associated with PPROM.    To OR when ready. Patient will go to L&D for IOL after her cerclage removal.    Jaynie Collins, MD, FACOG Attending  Obstetrician & Gynecologist Faculty Practice, [facility] [place]

## 2012-03-17 NOTE — Progress Notes (Addendum)
Patient ID: Diana Blair, female   DOB: 20-Jul-1980, 31 y.o.   MRN: [id number]  FACULTY PRACTICE ANTEPARTUM(COMPREHENSIVE) NOTE  Diana Blair is a 31 y.o. G1P0000 at [redacted]w[redacted]d  who is admitted for PPROM.   Length of Stay:  6  Days  Subjective:  Patient reports the fetal movement as active. Patient reports uterine contraction  activity as none. Patient reports  vaginal bleeding as none. Patient describes fluid per vagina as Meconium. Large amount of fluid came out of the vagina at 11:30 p.m.  Vitals:  Blood pressure 139/62, pulse 98, temperature 99.1 F (37.3 C), temperature source Oral, resp. rate 22, height 5\' 3"  (1.6 m), weight 351 lb 11.2 oz (159.53 kg), last menstrual period 10/16/2011, SpO2 100.00%. Physical Examination:  General appearance - alert, well appearing, and in no distress Abdomen - soft, nontender, nondistended, fundus nontender, no change in exam since admission Extremities - Homan's sign negative bilaterally  Fetal Monitoring:  +FH  Labs:  No results found for this or any previous visit (from the past 24 hour(s)).   Medications:  Scheduled    . amoxicillin  500 mg Oral Q8H  . docusate sodium  100 mg Oral Daily  . enoxaparin (LOVENOX) injection  80 mg Subcutaneous Q24H  . erythromycin  250 mg Oral Q6H  . metroNIDAZOLE  500 mg Oral Q12H  . prenatal multivitamin  1 tablet Oral Daily  . simethicone  80 mg Oral TID WC & HS    ASSESSMENT: Patient Active Problem List  Diagnosis  . Pregnancy, supervision, high-risk  . Morbid obesity  . Elevated BP  . Preterm premature rupture of membranes (PPROM) delivered, current hospitalization    PLAN: Diana Blair is a 31 y.o. G1P0000 at [redacted]w[redacted]d  who is admitted for PPROM.  There is particulate meconium fluid still continuing to ooze from patients vagina. Spent 2 hours with patient face tp face discussing the ominous sign.  Pt agrees to have cerclage removed and proceed with induction.  Discussed with anesthesia and  there is no IV access.  Pt needs PICC line.  Patient also had full meal with protein and fat at 8 pm.  Pt is now NPO, will stop Lovenox, PICC team coming (she is first case of the day) and prepare for cerclage removal in OR. Time spent with patient is 2 hours.  Diana Blair H. 03/17/2012,6:52 AM

## 2012-03-17 NOTE — Transfer of Care (Signed)
Immediate Anesthesia Transfer of Care Note  Patient: Diana Blair  Procedure(s) Performed: Procedure(s) (LRB) with comments: CERCLAGE CERVICAL (N/A) - Removal  Patient Location: PACU  Anesthesia Type:Spinal  Level of Consciousness: awake, alert , oriented and patient cooperative  Airway & Oxygen Therapy: Patient Spontanous Breathing  Post-op Assessment: Report given to PACU RN and Post -op Vital signs reviewed and stable  Post vital signs: Reviewed and stable  Complications: No apparent anesthesia complications

## 2012-03-17 NOTE — Anesthesia Procedure Notes (Signed)
Spinal  Patient location during procedure: OR Start time: 03/17/2012 12:26 PM Staffing Anesthesiologist: Demaris Leavell A. Performed by: anesthesiologist  Preanesthetic Checklist Completed: patient identified, site marked, surgical consent, pre-op evaluation, timeout performed, IV checked, risks and benefits discussed and monitors and equipment checked Spinal Block Patient position: sitting Prep: site prepped and draped and DuraPrep Patient monitoring: heart rate, cardiac monitor, continuous pulse ox and blood pressure Approach: midline Location: L3-4 Injection technique: single-shot Needle Needle type: Sprotte  Needle gauge: 24 G Needle length: 10 cm Needle insertion depth: 9 cm Assessment Sensory level: T6 Additional Notes Patient tolerated procedure well. Adequate sensory level.

## 2012-03-17 NOTE — Anesthesia Postprocedure Evaluation (Signed)
Anesthesia Post Note  Patient: Diana Blair  Procedure(s) Performed: Procedure(s) (LRB): CERCLAGE CERVICAL (N/A)  Anesthesia type: Spinal  Patient location: PACU  Post pain: Pain level controlled  Post assessment: Post-op Vital signs reviewed  Last Vitals:  Filed Vitals:   03/17/12 1330  BP:   Pulse: 80  Temp: 36.9 C  Resp:     Post vital signs: Reviewed  Level of consciousness: awake  Complications: No apparent anesthesia complications

## 2012-03-17 NOTE — Progress Notes (Signed)
Patient ID: Diana Blair, female   DOB: [birth year], 31 y.o.   MRN: [id number]  Called to see patient re: increased leakage of green fluid.  Large collection of green tinged fluid on towel and seen pooling between legs  FHR 150 per [place] --  Pt and her husband insisted on listening "to see if he is still alive"  Denies contraction pain, but feels back pain and "pressure"  Husband very agitated asking "what are we going to do now?"  He was very concerned over "when is the baby going to come" and "when is the baby going to die" and "when are you going to clean her up"  Patient also asking similar questions, though admits and repeats all that MFM and doctors have told her. Wants to know "what to do now"  Long discussion of unknown nature of timing of events. Need to watch and wait.

## 2012-03-17 NOTE — Anesthesia Preprocedure Evaluation (Signed)
Anesthesia Evaluation  Patient identified by MRN, date of birth, ID band Patient awake    Reviewed: Allergy & Precautions, H&P , Patient's Chart, lab work & pertinent test results  Airway Mallampati: II TM Distance: >3 FB Neck ROM: full    Dental No notable dental hx. (+) Teeth Intact   Pulmonary sleep apnea , former smoker,  Probable OSA   Pulmonary exam normal       Cardiovascular Rhythm:regular Rate:Normal  Elevated BP's   Neuro/Psych Seizures -,  negative psych ROS   GI/Hepatic negative GI ROS, Neg liver ROS,   Endo/Other  Morbid obesity  Renal/GU negative Renal ROS  negative genitourinary   Musculoskeletal   Abdominal Normal abdominal exam  (+)   Peds  Hematology negative hematology ROS (+)   Anesthesia Other Findings For cerclage removal.  Reproductive/Obstetrics (+) Pregnancy + or - 20 weeks Incompetent Cervix Chorioamnionitis                           Anesthesia Physical  Anesthesia Plan  ASA: III  Anesthesia Plan: Spinal   Post-op Pain Management:    Induction:   Airway Management Planned:   Additional Equipment:   Intra-op Plan:   Post-operative Plan:   Informed Consent: I have reviewed the patients History and Physical, chart, labs and discussed the procedure including the risks, benefits and alternatives for the proposed anesthesia with the patient or authorized representative who has indicated his/her understanding and acceptance.   Dental Advisory Given  Plan Discussed with: Anesthesiologist  Anesthesia Plan Comments:         Anesthesia Quick Evaluation

## 2012-03-17 NOTE — Progress Notes (Signed)
IV team RN here and explaining PICC procedure with spouse in the room.

## 2012-03-17 NOTE — OR Nursing (Signed)
Orders received to send pt back to labor and delivery as soon as ok by anesthesia by dr Macon Large. Margarita Mail rn

## 2012-03-17 NOTE — Progress Notes (Signed)
Attending Progress Note  Patient underwent induction of labor with misoprostol, received first dose of 600 mcg at 1530.  Around 1700, I was told to come evaluate patient with fetus palpated in vagina. On exam, fetal feet were in the vagina, patient was asked to push and the fetus was delivered at 1705. In footling breech presentations.Apgars 1, 1, 1; HR 30s, female fetus, no anatomic anomalies noted.  The cord was clamped and cut, and the infant was handed over to FOB. No resuscitative efforts were done.  Patient was noted to have a small amount of bleeding but the placenta did not deliver spontaneously.  A solution of 10 units of pitocin and 30 ml of LR was injected into the cord.  After a few minutes, she was noted to have heavier bleeding and was passing clots.  Bimanual exam evacuated larger clots from the vagina and lower uterine segment, placenta was noted to still be adherent to the top part of the fundus.  EBL was 1000 ml. Given her increased bleeding, patient was consented for attempt of manual extraction of placenta.  During the attempt, patient asked me to stop and desired going to the OR so that she can receive more analgesia. Of note, she received Fentanyl 100 mg IV dose but this was not adequate.  The risks of evacuation was explained in detail and we were getting ready to go to the OR when she reported having more pain and saying she felt she delivered something. On examination, placenta was noted to be delivered and was intact at 1820.  Minimal bleeding noted after placental delivery.  Cytotec 400 mcg was placed PR to aid with uterine involution.    Will continue Zosyn for now and continue to monitor patient. If she remains stable, she can be transferred to the floor for routine postpartum care.   Diana Collins, MD, FACOG Attending Obstetrician & Gynecologist Faculty Practice, [facility]

## 2012-03-17 NOTE — Progress Notes (Signed)
Peripherally Inserted Central Catheter/Midline Placement  The IV Nurse has discussed with the patient and/or persons authorized to consent for the patient, the purpose of this procedure and the potential benefits and risks involved with this procedure.  The benefits include less needle sticks, lab draws from the catheter and patient may be discharged home with the catheter.  Risks include, but not limited to, infection, bleeding, blood clot (thrombus formation), and puncture of an artery; nerve damage and irregular heat beat.  Alternatives to this procedure were also discussed. Husband present during consent.  PICC/Midline Placement Documentation  PICC / Midline Single Lumen 03/17/12 PICC Left Cephalic (Active)  Indication for Insertion or Continuance of Line Limited venous access - need for IV therapy >5 days (PICC only) 03/17/2012 10:00 AM  Length mark (cm) 2 cm 03/17/2012 10:00 AM  Site Assessment Intact;Clean;Dry 03/17/2012 10:00 AM  Line Status Flushed;Saline locked 03/17/2012 10:00 AM  Dressing Type Transparent 03/17/2012 10:00 AM  Dressing Status Clean;Dry;Intact;Antimicrobial disc in place 03/17/2012 10:00 AM       Taishawn Smaldone, Benetta Spar 03/17/2012, 10:58 AM

## 2012-03-17 NOTE — Progress Notes (Signed)
Pt talking and venting about her POC and hospital experience thus far.  Pt very upset, unhappy, and crying about the soon to be loss of her baby.  Pt also upset about her care/hospital stay so far.  Comfort measures given and reassured pt that she would be well taken care of.  Listened and explained POC to pt and family.  Pt and family verbalized understandings.    Dr. Macon Large notified and will continue to monitor.

## 2012-03-18 LAB — RPR: RPR Ser Ql: NONREACTIVE

## 2012-03-18 LAB — CBC
HCT: 26.1 % — ABNORMAL LOW (ref 36.0–46.0)
Hemoglobin: 8.5 g/dL — ABNORMAL LOW (ref 12.0–15.0)
MCHC: 32.6 g/dL (ref 30.0–36.0)
WBC: 12.4 10*3/uL — ABNORMAL HIGH (ref 4.0–10.5)

## 2012-03-18 MED ORDER — IBUPROFEN 600 MG PO TABS: 600.0000 mg | ORAL_TABLET | Freq: Four times a day (QID) | ORAL | Status: DC

## 2012-03-18 MED ORDER — ZOLPIDEM TARTRATE 5 MG PO TABS: 5.0000 mg | ORAL_TABLET | Freq: Every evening | ORAL | Status: DC | PRN

## 2012-03-18 NOTE — Progress Notes (Signed)
Chaplain paged at [phone] to assist with the father of the [place] baby (NAJEE - pronounced nod-GEE) who was in the [place] on the ground floor. When chaplain arrived at 43 father - Mr Nedra Hai - could not be located. Security and chaplain searched hospital. Ms Hose called his cell phone and found that he was outside smoking. He returned to Room 302 and requested he be able to see his child. Initially Ms Heichel did not wish to see the child because she thought he was frozen. Arrangements were made for Mr Nedra Hai, the baby father, to see his child in another room, but after some thought Ms Rask decided to have the baby Seward Carol) in the room. Mr Nedra Hai asked to be able to wash the ink from Najee's feet and to see the body bag he is kept in. Nurses assisted in this matter. While Mr Nedra Hai was downstairs seeing the [facility], chaplain had time to speak with Ms Thang. She is in deep grief. She feels she will not be able to conceive or birth another child. We spoke of support groups and assistance while she is in the hospital.   Ms Whidby and Mr Nedra Hai are seeking after all religious thoughts. Ms Pownell comes from a [place] background. Mr Nedra Hai is from a Muslim background. At present they are studying a form of [place] religion rooted in [place]. Family wishes some sort of "service" but cannot define how that service will be constructed.   CHIEF CONCERN for Ms Kyzer is that a Child psychotherapist will not be able to come to see her while she is in the hospital. Based on personal and religious beliefs she does not want hr child (Najee) cremated. She believes only in burial of a body. Social Worker is asked to assist her in fulfilling her requests. She feels she cannot afford funeral expenses.  Chaplain should be paged if requested or required to deal with spiritual or grief matters.  Benjie Karvonen. Senie Lanese, DMin Chaplain

## 2012-03-18 NOTE — Progress Notes (Signed)
Chaplain paged at the request of the patient to follow up on previous conversations. Ms Lukic has spoken with funeral home representatives and hospital social workers concerning arrangements for her child. Najee. The baby father is in a hurry to have their child buried, but Ms Barbour wishes to have burial and services after getting all the information and advice she can.  Ms Reindel is pleased with the support provided by [employer]. Her support did much to assist in making the choices facing Ms Bonifas presently.   Ms Corella is especially pleased with the excellent care provided by nurses, who have provided care while Ms Bartko has faced the tragic situation of the loss of her first child.   Spiritually Ms Freeburg is still balancing the [place]ri[place] Knuckles beliefs of her youth with those of her husband who is of a Muslim background. Their Universalist beliefs should be considered in care and support. These beliefs are a part of the fiber of their understanding and decision making dealing with death, burial and grief.  Pls page chaplain if required or requested.  Benjie Karvonen. Doxie Augenstein, DMin, APC Chaplain

## 2012-03-18 NOTE — Discharge Summary (Signed)
Obstetric Discharge Summary Reason for Admission: rupture of membranes Prenatal Procedures: cerclage on 11/18  Intrapartum Procedures: spontaneous vaginal delivery Postpartum Procedures: none Complications-Operative and Postpartum: choioamnionitis Hemoglobin  Date Value Range Status  03/18/2012 8.5* 12.0 - 15.0 g/dL Final     REPEATED TO VERIFY     DELTA CHECK NOTED     HCT  Date Value Range Status  03/18/2012 26.1* 36.0 - 46.0 % Final    Physical Exam:  General: alert, cooperative and no distress Lochia: appropriate Uterine Fundus: firm Incision: n/a DVT Evaluation: No evidence of DVT seen on physical exam.  Discharge Diagnoses: Premature delivery of previable infant at 21.5 weeks, chorioamnionitis  Discharge Information: Date: 03/18/2012 Activity: pelvic rest Diet: routine Medications: Ibuprofen Condition: stable Instructions: miscarriage Discharge to: home Follow-up Information    Follow up with [facility]. In 2 weeks.         Newborn Data: Live born female, neonatal demise Birth Weight: 11.6 oz (329 g) APGAR: 1, 1  To funeral home  Amariana Mirando 03/18/2012, 9:14 AM

## 2012-03-18 NOTE — Clinical Social Work Note (Signed)
CSW provided MOB with resources.  Full consult report to follow.    [phone]

## 2012-03-19 NOTE — Plan of Care (Signed)
Problem: Discharge Progression Outcomes Goal: Pain controlled with appropriate interventions Outcome: Completed/Met Date Met:  03/19/12 Good pain control on po Motrin

## 2012-03-19 NOTE — Progress Notes (Signed)
Teaching provided to patient at bedside in regards to activity, follow up appointments, medications and community resources.  No questions at this time.  Patient requesting Artis Flock basinet remain at hospital and to travel with remains to funeral home.  Patient undecided about funeral home.  Will call hospital once a decision has been reached. Contact information provided to patient.  Patient left unit with all personal belongings in stable condition in wheelchair accompanied by staff.  Osvaldo Angst, RN--------

## 2012-03-19 NOTE — Discharge Summary (Signed)
  Adam Phenix, MD Physician Signed D/C Summaries 03/18/2012 9:14 AM  Updated 03/19/12, patient stayed 03/18/12 Obstetric Discharge Summary  Reason for Admission: rupture of membranes  Prenatal Procedures: cerclage on 11/18  Intrapartum Procedures: spontaneous vaginal delivery  Postpartum Procedures: none  Complications-Operative and Postpartum: choioamnionitis    Hemoglobin    Date  Value  Range  Status    03/18/2012  8.5*  12.0 - 15.0 g/dL  Final    REPEATED TO VERIFY    DELTA CHECK NOTED       HCT    Date  Value  Range  Status    03/18/2012  26.1*  36.0 - 46.0 %  Final     Physical Exam:  General: alert, cooperative and no distress  Lochia: appropriate  Uterine Fundus: firm  Incision: n/a  DVT Evaluation: No evidence of DVT seen on physical exam.  Discharge Diagnoses: Premature delivery of previable infant at 21.5 weeks, chorioamnionitis  Discharge Information:  Date: 03/18/2012  Activity: pelvic rest  Diet: routine  Medications: Ibuprofen  Condition: stable  Instructions: miscarriage  Discharge to: home    Follow-up Information     Follow up with [facility]. In 2 weeks.            Newborn Data:  Live born female, neonatal demise  Birth Weight: 11.6 oz (329 g)  APGAR: 1, 1  To funeral home  ARNOLD,JAMES      03/19/2012

## 2012-03-20 ENCOUNTER — Encounter (HOSPITAL_COMMUNITY): Payer: Self-pay | Admitting: Obstetrics & Gynecology

## 2012-03-22 ENCOUNTER — Encounter: Payer: Self-pay | Admitting: Obstetrics & Gynecology

## 2012-03-26 NOTE — MAU Provider Note (Signed)
Patient seen and examined.  Explained diagnosis and plan of care to patient.  Will rpt amniosure in a.m.

## 2012-04-03 ENCOUNTER — Ambulatory Visit (INDEPENDENT_AMBULATORY_CARE_PROVIDER_SITE_OTHER): Payer: Self-pay | Admitting: Obstetrics and Gynecology

## 2012-04-03 DIAGNOSIS — Z8742 Personal history of other diseases of the female genital tract: Secondary | ICD-10-CM

## 2012-04-03 NOTE — Progress Notes (Signed)
CC: Follow-up   [name] is a 31 y.o. G1P0100 post induced SVD 03/18/2012  for PPROM  and chorioamnionitis at 21 weeks 5 days. PPROM probably occurred prior to her cerclage placement 03/13/2012. She states she is coping well with her ordeal and her pregnancy loss. She is having scanty brown vaginal bleeding. She denies pain. Has not yet been sexually active. Desires to return to work and once clearance for that. Desires to attempt pregnancy in the near future. Talked at length about her ideas of what went wrong with the pregnancy and plans to quit work if she conceives again. She does express understanding of the need to get prenatal care as early as possible in her next pregnancy and to continue on prenatal vitamins.    Past Medical History  Diagnosis Date  . Seizure   . Diverticulitis   . PCOS (polycystic ovarian syndrome)     OB History    Grav Para Term Preterm Abortions TAB SAB Ect Mult Living   1 1 0 1 0 0 0 0 0 1      # Outc Date GA Lbr Len/2nd Wgt Sex Del Anes PTL Lv   1 PRE 11/13 [redacted]w[redacted]d 00:41 / 00:07 11.6oz(0.329kg) M SVD Spinal  Yes      Past Surgical History  Procedure Date  . No past surgeries   . Cervical cerclage 03/13/2012    Procedure: CERCLAGE CERVICAL;  Surgeon: Tilda Burrow, MD;  Location: [facility];  Service: Gynecology;  Laterality: N/A;  . Cervical cerclage 03/17/2012    Procedure: CERCLAGE CERVICAL;  Surgeon: Tereso Newcomer, MD;  Location: [facility];  Service: Gynecology;  Laterality: N/A;  Removal    History   Social History  . Marital Status: Legally Separated    Spouse Name: N/A    Number of Children: N/A  . Years of Education: N/A   Occupational History  . Not on file.   Social History Main Topics  . Smoking status: Former Smoker    Types: Cigarettes    Quit date: 02/08/2009  . Smokeless tobacco: Never Used  . Alcohol Use: No  . Drug Use: No  . Sexually Active: Yes    Birth Control/ Protection: None   Other Topics Concern  . Not  on file   Social History Narrative  . No narrative on file    Current Outpatient Prescriptions on File Prior to Visit  Medication Sig Dispense Refill  . ibuprofen (ADVIL,MOTRIN) 600 MG tablet Take 1 tablet (600 mg total) by mouth every 6 (six) hours.  30 tablet  1  . Prenatal Vit-Fe Fumarate-FA (PRENATAL MULTIVITAMIN) TABS Take 1 tablet by mouth daily.      Marland Kitchen zolpidem (AMBIEN) 5 MG tablet Take 1 tablet (5 mg total) by mouth at bedtime as needed for sleep (insomnia.).  15 tablet  0    No Known Allergies  ROS Pertinent items in HPI  PHYSICAL EXAM Filed Vitals:   04/03/12 1511  BP: 126/81  Pulse: 102  Temp: 97.4 F (36.3 C)   General: Obese female in no acute distress Cardiovascular: Normal rate Respiratory: Normal effort Abdomen: Soft, nontender Back: No CVAT Extremities: tr dep edema Neurologic: Alert and oriented Pelvic deferred  LAB RESULTS No results found for this or any previous visit (from the past 24 hour(s)).  IMAGING US Ob Limited  03/16/2012  OBSTETRICAL ULTRASOUND: This exam was performed within a [facility]. The OB US report was generated in the AS system,  and faxed to the ordering physician.   This report is also available in [employer] and in the [employer]. See AS Obstetric US report.   US Ob Limited  03/11/2012  *RADIOLOGY REPORT*  Clinical Data: 21-week gestational age with an open cervix.  LIMITED OBSTETRIC ULTRASOUND  Number of Fetuses: 1 Heart Rate: 163 bpm Movement: Present Presentation: Transverse Placental Location: Posterior and lateral on the right. Previa: None. Amniotic Fluid (Subjective): Normal.  Vertical Pocket 5.6 cm  BPD:  4.39 cm     19 w  2 d  MATERNAL FINDINGS: Cervix: Open, with an internal os diameter of 2.5 cm and external os diameter of 1.9 cm. No measurable cervix (i.e., complete effacement)  IMPRESSION: 1.  Open and effaced cervix. 2.  Single viable IUP with an estimated gestational age  of [redacted] weeks and 2 days.  Fetal heart rate of 163 beats per minute.  A verbal report was given to the patient's nurse Victorino Dike by the sonographer Dorian Pod) at the time of the examination.   Original Report Authenticated By: Trudie Reed, M.D.    US Ob Follow Up  03/13/2012  OBSTETRICAL ULTRASOUND: This exam was performed within a [facility]. The OB US report was generated in the AS system, and faxed to the ordering physician.   This report is also available in [employer] and in the [employer]. See AS Obstetric US report.   Dg Chest Port 1 View  03/17/2012  *RADIOLOGY REPORT*  Clinical Data: Confirm PICC line placement  PORTABLE CHEST - 1 VIEW  Comparison: 01/28/2010  Findings: Cardiomediastinal silhouette is within normal limits. There has been placement of a PICC line via a left subclavian approach and the tip is located in the distal superior vena cava in the appropriate position.  The lung fields appear clear with no signs of focal infiltrate or congestive failure.    The costophrenic angles are excluded bilaterally and evaluation for small pleural effusions is not possible.  Bony structures appear intact.  IMPRESSION: Appropriate PICC line placement as described above.   Original Report Authenticated By: Rhodia Albright, M.D.    ASSESSMENT  1. Preterm delivery   2. Morbid obesity   3. History of PCOS   S/P PTB nonviable infant at [redacted]w[redacted]d s/p PPROM, cerclage, chorio Anemia, iron deficiency and ABL  PLAN See AVS for patient education. May return to work.  Continue prenatal vitamins Return in 6 months and prn     Danae Orleans, CNM 04/03/2012 3:53 PM

## 2012-04-03 NOTE — Patient Instructions (Signed)
Miscarriage A miscarriage is the sudden loss of an unborn baby (fetus) before the 20th week of pregnancy. Most miscarriages happen in the first 3 months of pregnancy. Sometimes, it happens before a woman even knows she is pregnant. A miscarriage is also called a "spontaneous miscarriage" or "early pregnancy loss." Having a miscarriage can be an emotional experience. Talk with your caregiver about any questions you may have about miscarrying, the grieving process, and your future pregnancy plans. CAUSES   Problems with the fetal chromosomes that make it impossible for the baby to develop normally. Problems with the baby's genes or chromosomes are most often the result of errors that occur, by chance, as the embryo divides and grows. The problems are not inherited from the parents.  Infection of the cervix or uterus.   Hormone problems.   Problems with the cervix, such as having an incompetent cervix. This is when the tissue in the cervix is not strong enough to hold the pregnancy.   Problems with the uterus, such as an abnormally shaped uterus, uterine fibroids, or congenital abnormalities.   Certain medical conditions.   Smoking, drinking alcohol, or taking illegal drugs.   Trauma.  Often, the cause of a miscarriage is unknown.  SYMPTOMS   Vaginal bleeding or spotting, with or without cramps or pain.  Pain or cramping in the abdomen or lower back.  Passing fluid, tissue, or blood clots from the vagina. DIAGNOSIS  Your caregiver will perform a physical exam. You may also have an ultrasound to confirm the miscarriage. Blood or urine tests may also be ordered. TREATMENT   Sometimes, treatment is not necessary if you naturally pass all the fetal tissue that was in the uterus. If some of the fetus or placenta remains in the body (incomplete miscarriage), tissue left behind may become infected and must be removed. Usually, a dilation and curettage (D and C) procedure is performed.  During a D and C procedure, the cervix is widened (dilated) and any remaining fetal or placental tissue is gently removed from the uterus.  Antibiotic medicines are prescribed if there is an infection. Other medicines may be given to reduce the size of the uterus (contract) if there is a lot of bleeding.  If you have Rh negative blood and your baby was Rh positive, you will need a Rh immunoglobulin shot. This shot will protect any future baby from having Rh blood problems in future pregnancies. HOME CARE INSTRUCTIONS   Your caregiver may order bed rest or may allow you to continue light activity. Resume activity as directed by your caregiver.  Have someone help with home and family responsibilities during this time.   Keep track of the number of sanitary pads you use each day and how soaked (saturated) they are. Write down this information.   Do not use tampons. Do not douche or have sexual intercourse until approved by your caregiver.   Only take over-the-counter or prescription medicines for pain or discomfort as directed by your caregiver.   Do not take aspirin. Aspirin can cause bleeding.   Keep all follow-up appointments with your caregiver.   If you or your partner have problems with grieving, talk to your caregiver or seek counseling to help cope with the pregnancy loss. Allow enough time to grieve before trying to get pregnant again.  SEEK IMMEDIATE MEDICAL CARE IF:   You have severe cramps or pain in your back or abdomen.  You have a fever.  You pass large blood clots (walnut-sized   or larger) ortissue from your vagina. Save any tissue for your caregiver to inspect.   Your bleeding increases.   You have a thick, bad-smelling vaginal discharge.  You become lightheaded, weak, or you faint.   You have chills.  MAKE SURE YOU:  Understand these instructions.  Will watch your condition.  Will get help right away if you are not doing well or get  worse. Document Released: 10/06/2000 Document Revised: 10/12/2011 Document Reviewed: 06/01/2011 [employer] Patient Information 2013 [employer].  

## 2012-09-21 ENCOUNTER — Inpatient Hospital Stay (HOSPITAL_COMMUNITY)
Admission: AD | Admit: 2012-09-21 | Discharge: 2012-09-22 | Disposition: A | Discharge status: Home / Self Care | Payer: Self-pay | Source: Ambulatory Visit | Attending: Family Medicine | Admitting: Family Medicine

## 2012-09-21 DIAGNOSIS — N949 Unspecified condition associated with female genital organs and menstrual cycle: Secondary | ICD-10-CM | POA: Insufficient documentation

## 2012-09-21 DIAGNOSIS — N760 Acute vaginitis: Secondary | ICD-10-CM

## 2012-09-21 DIAGNOSIS — A499 Bacterial infection, unspecified: Secondary | ICD-10-CM | POA: Insufficient documentation

## 2012-09-21 DIAGNOSIS — N926 Irregular menstruation, unspecified: Secondary | ICD-10-CM | POA: Insufficient documentation

## 2012-09-21 DIAGNOSIS — B9689 Other specified bacterial agents as the cause of diseases classified elsewhere: Secondary | ICD-10-CM | POA: Insufficient documentation

## 2012-09-21 LAB — URINE MICROSCOPIC-ADD ON

## 2012-09-21 LAB — URINALYSIS, ROUTINE W REFLEX MICROSCOPIC
Glucose, UA: NEGATIVE mg/dL
Leukocytes, UA: NEGATIVE
Protein, ur: NEGATIVE mg/dL
Specific Gravity, Urine: >1.03 — ABNORMAL HIGH (ref 1.005–1.030)
Urobilinogen, UA: 0.2 mg/dL (ref 0.0–1.0)

## 2012-09-21 LAB — POCT PREGNANCY, URINE: Preg Test, Ur: NEGATIVE

## 2012-09-21 NOTE — MAU Note (Addendum)
Pt having irregular bleeding, feeling sick.  LMP 5/17, bleeding x 1 day then brownish spotting x 4 days.

## 2012-09-22 ENCOUNTER — Encounter (HOSPITAL_COMMUNITY): Payer: Self-pay | Admitting: *Deleted

## 2012-09-22 DIAGNOSIS — A499 Bacterial infection, unspecified: Secondary | ICD-10-CM

## 2012-09-22 DIAGNOSIS — N760 Acute vaginitis: Secondary | ICD-10-CM

## 2012-09-22 LAB — WET PREP, GENITAL

## 2012-09-22 MED ORDER — METRONIDAZOLE 500 MG PO TABS: 500.0000 mg | ORAL_TABLET | Freq: Two times a day (BID) | ORAL | Status: DC

## 2012-09-22 NOTE — MAU Provider Note (Signed)
Chart reviewed and agree with management and plan.  

## 2012-09-22 NOTE — MAU Provider Note (Signed)
History     CSN: [id number]  Arrival date and time: 09/21/12 2104   None     Chief Complaint  Patient presents with  . Nausea  . Menstrual Problem   HPI Ms Lavey is a 32yo G1P0101 here for eval of irreg cycle with some intermittent vaginal pain that is not occuring now. Had spotting on 5/17 with brown d/c x 4 days after. Reports a little bit of vag pressure now. Denies fever, vomiting or diarrhea. Reports occ nausea. Denies STD testing as states 'I've been with the same partner for 7 years.'  Many concerns re not having primary care provider and the expense of hospital bills without health insurance.  OB History   Grav Para Term Preterm Abortions TAB SAB Ect Mult Living   1 1 0 1 0 0 0 0 0 1       Past Medical History  Diagnosis Date  . Seizure   . Diverticulitis   . PCOS (polycystic ovarian syndrome)     Past Surgical History  Procedure Laterality Date  . No past surgeries    . Cervical cerclage  03/13/2012    Procedure: CERCLAGE CERVICAL;  Surgeon: Tilda Burrow, MD;  Location: [facility];  Service: Gynecology;  Laterality: N/A;  . Cervical cerclage  03/17/2012    Procedure: CERCLAGE CERVICAL;  Surgeon: Tereso Newcomer, MD;  Location: [facility];  Service: Gynecology;  Laterality: N/A;  Removal    Family History  Problem Relation Age of Onset  . Other Neg Hx   . Cancer Mother     lung  . Diabetes Father     History  Substance Use Topics  . Smoking status: Former Smoker    Types: Cigarettes    Quit date: 02/08/2009  . Smokeless tobacco: Never Used  . Alcohol Use: No    Allergies: No Known Allergies  Prescriptions prior to admission  Medication Sig Dispense Refill  . Prenatal Vit-Fe Fumarate-FA (PRENATAL MULTIVITAMIN) TABS Take 1 tablet by mouth daily.        ROS Physical Exam   Blood pressure 130/101, pulse 94, temperature 98.9 F (37.2 C), temperature source Oral, resp. rate 18, height 5\' 3"  (1.6 m), weight 165.654 kg (365 lb 3.2 oz), last menstrual  period 09/09/2012, not currently breastfeeding.  Repeat BP with large cuff: 105/67  Physical Exam  Constitutional: She is oriented to person, place, and time. She appears well-developed.  Morbidly obese  HENT:  Head: Normocephalic.  Cardiovascular: Normal rate.   Respiratory: Effort normal.  GI: Soft.  Genitourinary: Vagina normal.  sm thin white vag d/c; cx NT; bimanual difficult with obese abd- no pain elicited  Musculoskeletal: Normal range of motion.  Neurological: She is alert and oriented to person, place, and time.  Skin: Skin is warm and dry.  Psychiatric: She has a normal mood and affect. Her behavior is normal. Thought content normal.   Urine preg test: neg  Urinalysis    Component Value Date/Time   COLORURINE YELLOW 09/21/2012 2135   APPEARANCEUR CLEAR 09/21/2012 2135   LABSPEC >1.030* 09/21/2012 2135   PHURINE 6.0 09/21/2012 2135   GLUCOSEU NEGATIVE 09/21/2012 2135   HGBUR TRACE* 09/21/2012 2135   BILIRUBINUR NEGATIVE 09/21/2012 2135   KETONESUR NEGATIVE 09/21/2012 2135   PROTEINUR NEGATIVE 09/21/2012 2135   UROBILINOGEN 0.2 09/21/2012 2135   NITRITE NEGATIVE 09/21/2012 2135   LEUKOCYTESUR NEGATIVE 09/21/2012 2135   Wet prep: mod clue cells, few WBCs  MAU Course  Procedures    Assessment  and Plan  Vag pressure Irreg cycles BV  D/C home Rx Flagyl given Instructed to seek care at the gyn clinic or family practice for follow up as needed- advice re seeking medical care without insurance on d/c paperwork.  SHAW, KIMBERLY 09/22/2012, 1:10 AM

## 2012-09-22 NOTE — MAU Note (Signed)
Pt states she had a cycle tha t only lasted 3 days.

## 2012-09-22 NOTE — Progress Notes (Signed)
Pt states she is having a little pressure"that's it"

## 2012-10-11 ENCOUNTER — Ambulatory Visit (INDEPENDENT_AMBULATORY_CARE_PROVIDER_SITE_OTHER): Payer: Self-pay | Admitting: Obstetrics & Gynecology

## 2012-10-11 ENCOUNTER — Encounter: Payer: Self-pay | Admitting: Obstetrics & Gynecology

## 2012-10-11 DIAGNOSIS — N938 Other specified abnormal uterine and vaginal bleeding: Secondary | ICD-10-CM

## 2012-10-11 DIAGNOSIS — N949 Unspecified condition associated with female genital organs and menstrual cycle: Secondary | ICD-10-CM

## 2012-10-11 MED ORDER — MISOPROSTOL 200 MCG PO TABS: ORAL_TABLET | ORAL | Status: DC

## 2012-10-11 NOTE — Progress Notes (Signed)
Pt did not get Rx for metronidazole.  She is here today because she has had a missed period (expected on 6/14) and has been having some abdominal and back pain. Pt was seen @ MAU on 5/29 because of pelvic pressure, pain and had abnormal period on 09/09/12. Had negative UPT that day.

## 2012-10-11 NOTE — Progress Notes (Signed)
  Subjective:    Patient ID: Diana Blair, female    DOB: 25-Apr-1981, 32 y.o.   MRN: [id number]  HPI  51 M AA P1LC 0 who is here today for [facility] follow up. She was seen there 09/21/12 for DUB. Her periods are generally monthly. She is having unprotected IC. She has not had a period yet this month. UPT negative at [facility].  Review of Systems Pap normal with negative HPV 10/13.    Objective:   Physical Exam        Assessment & Plan:  DUB- check TSH and gyn u/s If no period by 8/14 and negatie UPT, rec EMBX

## 2012-10-12 LAB — CBC
HCT: 34.8 % — ABNORMAL LOW (ref 36.0–46.0)
MCH: 24.5 pg — ABNORMAL LOW (ref 26.0–34.0)
MCHC: 32.5 g/dL (ref 30.0–36.0)
MCV: 75.5 fL — ABNORMAL LOW (ref 78.0–100.0)
RDW: 17.9 % — ABNORMAL HIGH (ref 11.5–15.5)

## 2012-10-13 ENCOUNTER — Ambulatory Visit (HOSPITAL_COMMUNITY): Admission: RE | Admit: 2012-10-13 | Payer: Self-pay | Source: Ambulatory Visit

## 2012-10-13 ENCOUNTER — Ambulatory Visit (HOSPITAL_COMMUNITY)
Admission: RE | Admit: 2012-10-13 | Discharge: 2012-10-13 | Disposition: A | Discharge status: Home / Self Care | Payer: Self-pay | Source: Ambulatory Visit | Attending: Obstetrics & Gynecology | Admitting: Obstetrics & Gynecology

## 2012-10-13 DIAGNOSIS — N859 Noninflammatory disorder of uterus, unspecified: Secondary | ICD-10-CM | POA: Insufficient documentation

## 2012-10-13 DIAGNOSIS — N949 Unspecified condition associated with female genital organs and menstrual cycle: Secondary | ICD-10-CM | POA: Insufficient documentation

## 2012-10-13 DIAGNOSIS — E282 Polycystic ovarian syndrome: Secondary | ICD-10-CM | POA: Insufficient documentation

## 2012-10-13 DIAGNOSIS — M549 Dorsalgia, unspecified: Secondary | ICD-10-CM | POA: Insufficient documentation

## 2012-10-13 DIAGNOSIS — N938 Other specified abnormal uterine and vaginal bleeding: Secondary | ICD-10-CM | POA: Insufficient documentation

## 2012-10-16 ENCOUNTER — Telehealth: Payer: Self-pay | Admitting: *Deleted

## 2012-10-16 NOTE — Telephone Encounter (Signed)
Patient left a message that she is requesting results from Friday.

## 2012-10-16 NOTE — Telephone Encounter (Signed)
Called pt and left message to return call the clinics.

## 2012-10-17 NOTE — Telephone Encounter (Signed)
Pt called the front desk and was transferred to me and I informed pt of her results of her bloodwork and [place] and follow the instructions that her and the provider agreed.  Pt stated understanding and did not have further questions.

## 2012-10-23 ENCOUNTER — Other Ambulatory Visit: Payer: Self-pay | Admitting: Medical

## 2012-10-23 DIAGNOSIS — N760 Acute vaginitis: Secondary | ICD-10-CM

## 2012-10-23 MED ORDER — METRONIDAZOLE 500 MG PO TABS: 500.0000 mg | ORAL_TABLET | Freq: Two times a day (BID) | ORAL | Status: DC

## 2012-10-23 NOTE — Progress Notes (Signed)
Patient called [facility] stating that she had misplaced Rx for Flagyl previously prescribed for BV. Unable to verify with pharmacy. Will send one additional Rx for Flagyl to patient's pharmacy of choice.   Freddi Starr, PA-C 10/23/2012 10:38 PM

## 2012-11-01 ENCOUNTER — Ambulatory Visit: Payer: Medicaid Other | Admitting: Family Medicine

## 2012-12-28 ENCOUNTER — Telehealth: Payer: Self-pay | Admitting: Obstetrics and Gynecology

## 2012-12-28 ENCOUNTER — Other Ambulatory Visit: Payer: Self-pay | Admitting: Obstetrics & Gynecology

## 2012-12-28 DIAGNOSIS — N912 Amenorrhea, unspecified: Secondary | ICD-10-CM

## 2012-12-28 MED ORDER — METFORMIN HCL 500 MG PO TABS: 500.0000 mg | ORAL_TABLET | Freq: Two times a day (BID) | ORAL | Status: DC

## 2012-12-28 NOTE — Telephone Encounter (Signed)
Patient called stating that she does not want edobx as advised by Dr. Marice Potter and that she just wants to get re-started with Metformin for PCOS that she was diagnosed of in 2011 and as stated by [facility] last month. Request approved by Dr. Dolan Amen; Rx sent to pharmacy on file. Patient satisfied.

## 2012-12-28 NOTE — Progress Notes (Signed)
Pt was previously on Metformin which regulated her cycles.  She has not had menses since May and wants to restart the Metfomin.  She does not want to come in for an OV.  Restarted Metformin 500mg  bid x 3 months.  F/u in 3 months or sooner prn.    clh-S

## 2013-02-08 ENCOUNTER — Ambulatory Visit (INDEPENDENT_AMBULATORY_CARE_PROVIDER_SITE_OTHER): Payer: Self-pay | Admitting: Obstetrics & Gynecology

## 2013-02-08 ENCOUNTER — Encounter: Payer: Self-pay | Admitting: Obstetrics & Gynecology

## 2013-02-08 VITALS — BP 118/69 | HR 69 | Temp 99.1°F | Ht 64.0 in | Wt 368.3 lb

## 2013-02-08 DIAGNOSIS — E282 Polycystic ovarian syndrome: Secondary | ICD-10-CM | POA: Insufficient documentation

## 2013-02-08 NOTE — Progress Notes (Signed)
Patient ID: Diana Blair, female   DOB: 07-Apr-1981, 32 y.o.   MRN: [id number] Patient's last menstrual period was 01/17/2013. G1P0100 Had menses after restarting her metformin. Will continue as directed and f/u  In 6 months for review.  Adam Phenix, MD 02/08/2013 4:00 PM

## 2013-02-08 NOTE — Patient Instructions (Signed)
Polycystic Ovarian Syndrome  Polycystic ovarian syndrome is a condition with a number of problems. One problem is with the ovaries. The ovaries are organs located in the female pelvis, on each side of the uterus. Usually, during the menstrual cycle, an egg is released from 1 ovary every month. This is called ovulation. When the egg is fertilized, it goes into the womb (uterus), which allows for the growth of a baby. The egg travels from the ovary through the fallopian tube to the uterus. The ovaries also make the hormones estrogen and progesterone. These hormones help the development of a woman's breasts, body shape, and body hair. They also regulate the menstrual cycle and pregnancy.  Sometimes, cysts form in the ovaries. A cyst is a fluid-filled sac. On the ovary, different types of cysts can form. The most common type of ovarian cyst is called a functional or ovulation cyst. It is normal, and often forms during the normal menstrual cycle. Each month, a woman's ovaries grow tiny cysts that hold the eggs. When an egg is fully grown, the sac breaks open. This releases the egg. Then, the sac which released the egg from the ovary dissolves. In one type of functional cyst, called a follicle cyst, the sac does not break open to release the egg. It may actually continue to grow. This type of cyst usually disappears within 1 to 3 months.   One type of cyst problem with the ovaries is called Polycystic Ovarian Syndrome (PCOS). In this condition, many follicle cysts form, but do not rupture and produce an egg. This health problem can affect the following:  · Menstrual cycle.  · Heart.  · Obesity.  · Cancer of the uterus.  · Fertility.  · Blood vessels.  · Hair growth (face and body) or baldness.  · Hormones.  · Appearance.  · High blood pressure.  · Stroke.  · Insulin production.  · Inflammation of the liver.  · Elevated blood cholesterol and triglycerides.  CAUSES   · No one knows the exact cause of PCOS.  · Women with  PCOS often have a mother or sister with PCOS. There is not yet enough proof to say this is inherited.  · Many women with PCOS have a weight problem.  · Researchers are looking at the relationship between PCOS and the body's ability to make insulin. Insulin is a hormone that regulates the change of sugar, starches, and other food into energy for the body's use, or for storage. Some women with PCOS make too much insulin. It is possible that the ovaries react by making too many female hormones, called androgens. This can lead to acne, excessive hair growth, weight gain, and ovulation problems.  · Too much production of luteinizing hormone (LH) from the pituitary gland in the brain stimulates the ovary to produce too much female hormone (androgen).  SYMPTOMS   · Infrequent or no menstrual periods, and/or irregular bleeding.  · Inability to get pregnant (infertility), because of not ovulating.  · Increased growth of hair on the face, chest, stomach, back, thumbs, thighs, or toes.  · Acne, oily skin, or dandruff.  · Pelvic pain.  · Weight gain or obesity, usually carrying extra weight around the waist.  · Type 2 diabetes (this is the diabetes that usually does not need insulin).  · High cholesterol.  · High blood pressure.  · Female-pattern baldness or thinning hair.  · Patches of thickened and dark brown or black skin on the neck, arms, breasts,   or thighs.  · Skin tags, or tiny excess flaps of skin, in the armpits or neck area.  · Sleep apnea (excessive snoring and breathing stops at times while asleep).  · Deepening of the voice.  · Gestational diabetes when pregnant.  · Increased risk of miscarriage with pregnancy.  DIAGNOSIS   There is no single test to diagnose PCOS.   · Your caregiver will:  · Take a medical history.  · Perform a pelvic exam.  · Perform an ultrasound.  · Check your female and female hormone levels.  · Measure glucose or sugar levels in the blood.  · Do other blood tests.  · If you are producing too many  female hormones, your caregiver will make sure it is from PCOS. At the physical exam, your caregiver will want to evaluate the areas of increased hair growth. Try to allow natural hair growth for a few days before the visit.  · During a pelvic exam, the ovaries may be enlarged or swollen by the increased number of small cysts. This can be seen more easily by vaginal ultrasound or screening, to examine the ovaries and lining of the uterus (endometrium) for cysts. The uterine lining may become thicker, if there has not been a regular period.  TREATMENT   Because there is no cure for PCOS, it needs to be managed to prevent problems. Treatments are based on your symptoms. Treatment is also based on whether you want to have a baby or whether you need contraception.   Treatment may include:  · Progesterone hormone, to start a menstrual period.  · Birth control pills, to make you have regular menstrual periods.  · Medicines to make you ovulate, if you want to get pregnant.  · Medicines to control your insulin.  · Medicine to control your blood pressure.  · Medicine and diet, to control your high cholesterol and triglycerides in your blood.  · Surgery, making small holes in the ovary, to decrease the amount of female hormone production. This is done through a long, lighted tube (laparoscope), placed into the pelvis through a tiny incision in the lower abdomen.  Your caregiver will go over some of the choices with you.  WOMEN WITH PCOS HAVE THESE CHARACTERISTICS:  · High levels of female hormones called androgens.  · An irregular or no menstrual cycle.  · May have many small cysts in their ovaries.  PCOS is the most common hormonal reproductive problem in women of childbearing age.  WHY DO WOMEN WITH PCOS HAVE TROUBLE WITH THEIR MENSTRUAL CYCLE?  Each month, about 20 eggs start to mature in the ovaries. As one egg grows and matures, the follicle breaks open to release the egg, so it can travel through the fallopian tube for  fertilization. When the single egg leaves the follicle, ovulation takes place. In women with PCOS, the ovary does not make all of the hormones it needs for any of the eggs to fully mature. They may start to grow and accumulate fluid, but no one egg becomes large enough. Instead, some may remain as cysts. Since no egg matures or is released, ovulation does not occur and the hormone progesterone is not made. Without progesterone, a woman's menstrual cycle is irregular or absent. Also, the cysts produce female hormones, which continue to prevent ovulation.   Document Released: 08/06/2004 Document Revised: 07/05/2011 Document Reviewed: 02/28/2009  ExitCare® Patient Information ©2014 [employer].

## 2013-05-24 ENCOUNTER — Telehealth: Payer: Self-pay | Admitting: *Deleted

## 2013-05-24 MED ORDER — FLUCONAZOLE 150 MG PO TABS: 150.0000 mg | ORAL_TABLET | Freq: Once | ORAL | Status: DC

## 2013-05-24 NOTE — Telephone Encounter (Signed)
Pt left message stating that she was last seen in our clinic by Dr. Roselie Awkward and told she did not need to return for 6 mos. She further stated that she is having an urgent problem and needs a prescription. She requested a call back an ddi not state the nature of her problem or the name of medication she is requesting. I called pt back and discussed her concerns. She stated that she is having itching of the external vagina and white, clumpy d/c. She feels it is a yeast infection as she has had the same sx in the past. I told pt that I can send Rx to her pharmacy and she can also use hydrocortisone cream the the external genitalia. Pt voiced understanding.

## 2013-09-06 ENCOUNTER — Emergency Department (HOSPITAL_COMMUNITY)
Admission: EM | Admit: 2013-09-06 | Discharge: 2013-09-07 | Disposition: A | Discharge status: Home / Self Care | Payer: Self-pay | Attending: Emergency Medicine | Admitting: Emergency Medicine

## 2013-09-06 ENCOUNTER — Encounter (HOSPITAL_COMMUNITY): Payer: Self-pay | Admitting: Emergency Medicine

## 2013-09-06 ENCOUNTER — Other Ambulatory Visit: Payer: Self-pay

## 2013-09-06 ENCOUNTER — Emergency Department (HOSPITAL_COMMUNITY): Payer: Self-pay

## 2013-09-06 DIAGNOSIS — R059 Cough, unspecified: Secondary | ICD-10-CM | POA: Insufficient documentation

## 2013-09-06 DIAGNOSIS — Z8719 Personal history of other diseases of the digestive system: Secondary | ICD-10-CM | POA: Insufficient documentation

## 2013-09-06 DIAGNOSIS — Z79899 Other long term (current) drug therapy: Secondary | ICD-10-CM | POA: Insufficient documentation

## 2013-09-06 DIAGNOSIS — Z3202 Encounter for pregnancy test, result negative: Secondary | ICD-10-CM | POA: Insufficient documentation

## 2013-09-06 DIAGNOSIS — Z87891 Personal history of nicotine dependence: Secondary | ICD-10-CM | POA: Insufficient documentation

## 2013-09-06 DIAGNOSIS — R05 Cough: Secondary | ICD-10-CM | POA: Insufficient documentation

## 2013-09-06 DIAGNOSIS — R0602 Shortness of breath: Secondary | ICD-10-CM | POA: Insufficient documentation

## 2013-09-06 DIAGNOSIS — Z8669 Personal history of other diseases of the nervous system and sense organs: Secondary | ICD-10-CM | POA: Insufficient documentation

## 2013-09-06 DIAGNOSIS — R079 Chest pain, unspecified: Secondary | ICD-10-CM | POA: Insufficient documentation

## 2013-09-06 DIAGNOSIS — Z8742 Personal history of other diseases of the female genital tract: Secondary | ICD-10-CM | POA: Insufficient documentation

## 2013-09-06 LAB — CBC
HEMATOCRIT: 39.3 % (ref 36.0–46.0)
HEMOGLOBIN: 12.6 g/dL (ref 12.0–15.0)
MCH: 25.8 pg — ABNORMAL LOW (ref 26.0–34.0)
MCHC: 32.1 g/dL (ref 30.0–36.0)
MCV: 80.5 fL (ref 78.0–100.0)
Platelets: 319 10*3/uL (ref 150–400)
RBC: 4.88 MIL/uL (ref 3.87–5.11)
RDW: 16.6 % — AB (ref 11.5–15.5)
WBC: 7.9 10*3/uL (ref 4.0–10.5)

## 2013-09-06 LAB — I-STAT TROPONIN, ED: TROPONIN I, POC: 0.01 ng/mL (ref 0.00–0.08)

## 2013-09-06 NOTE — ED Notes (Signed)
Pt reports L cp with SOB which started last night.  Pt reports hx of PNA several years ago.  Pt reports pain radiates to her back.

## 2013-09-06 NOTE — ED Provider Notes (Signed)
CSN: [id number]     Arrival date & time 09/06/13  2139 History   First MD Initiated Contact with Patient 09/06/13 2321     Chief Complaint  Patient presents with  . Chest Pain  . Shortness of Breath     (Consider location/radiation/quality/duration/timing/severity/associated sxs/prior Treatment) HPI HX per PT - cough, L sided CP and SOB, feels similar to previous Dx of PNA in the past.  LGF, no chills, no rash. Complains of productive cough no hemoptysis. Symptoms MOD in severity, started few days ago and worse tonight.   Past Medical History  Diagnosis Date  . Seizure   . Diverticulitis   . PCOS (polycystic ovarian syndrome)   . Morbid obesity    Past Surgical History  Procedure Laterality Date  . No past surgeries    . Cervical cerclage  03/13/2012    Procedure: CERCLAGE CERVICAL;  Surgeon: Jonnie Kind, MD;  Location: [facility];  Service: Gynecology;  Laterality: N/A;  . Cervical cerclage  03/17/2012    Procedure: CERCLAGE CERVICAL;  Surgeon: Osborne Oman, MD;  Location: [facility];  Service: Gynecology;  Laterality: N/A;  Removal   Family History  Problem Relation Age of Onset  . Other Neg Hx   . Cancer Mother     lung  . Diabetes Father    History  Substance Use Topics  . Smoking status: Former Smoker    Types: Cigarettes    Quit date: 02/08/2009  . Smokeless tobacco: Never Used  . Alcohol Use: No   OB History   Grav Para Term Preterm Abortions TAB SAB Ect Mult Living   1 1 0 1 0 0 0 0 0 0      Review of Systems  Constitutional: Negative for fever and chills.  Respiratory: Positive for cough and shortness of breath.   Cardiovascular: Positive for chest pain.  Gastrointestinal: Negative for vomiting and abdominal pain.  Genitourinary: Negative for dysuria and flank pain.  Musculoskeletal: Negative for neck pain.  Skin: Negative for rash.  Neurological: Negative for weakness and headaches.  All other systems reviewed and are negative.     Allergies   Review of patient's allergies indicates no known allergies.  Home Medications   Prior to Admission medications   Medication Sig Start Date End Date Taking? Authorizing Provider  metFORMIN (GLUCOPHAGE) 500 MG tablet Take 1 tablet (500 mg total) by mouth 2 (two) times daily with a meal. 12/28/12   Lavonia Drafts, MD   BP 143/84  Pulse 77  Temp(Src) 99.5 F (37.5 C) (Oral)  Resp 20  SpO2 97%  LMP 09/03/2013 Physical Exam  Constitutional: She is oriented to person, place, and time. She appears well-developed and well-nourished.  HENT:  Head: Normocephalic and atraumatic.  Mouth/Throat: Oropharynx is clear and moist.  Eyes: EOM are normal. Pupils are equal, round, and reactive to light.  Neck: Neck supple.  Cardiovascular: Normal rate, regular rhythm and intact distal pulses.   Pulmonary/Chest: Effort normal. No respiratory distress. She exhibits no tenderness.  Decreased bilateral breath sounds  Abdominal: Soft. Bowel sounds are normal. She exhibits no distension. There is no tenderness.  Musculoskeletal: Normal range of motion. She exhibits no tenderness.  Neurological: She is alert and oriented to person, place, and time. No cranial nerve deficit.  Skin: Skin is warm and dry.    ED Course  Procedures (including critical care time) Labs Review Labs Reviewed  CBC - Abnormal; Notable for the following:    MCH 25.8 (*)  RDW 16.6 (*)    All other components within normal limits  D-DIMER, QUANTITATIVE - Abnormal; Notable for the following:    D-Dimer, Quant 0.67 (*)    All other components within normal limits  BASIC METABOLIC PANEL  I-STAT TROPOININ, ED  POC URINE PREG, ED  Randolm Idol, ED    Imaging Review Dg Chest 2 View  09/06/2013   CLINICAL DATA:  Chest pain and shortness of breath.  EXAM: CHEST  2 VIEW  COMPARISON:  03/17/2012  FINDINGS: The heart size and mediastinal contours are within normal limits. Both lungs are clear. The visualized skeletal  structures are unremarkable.  IMPRESSION: No active cardiopulmonary disease.   Electronically Signed   By: Lucienne Capers M.D.   On: 09/06/2013 23:49   Ct Angio Chest Pe W/cm &/or Wo Cm  09/07/2013   CLINICAL DATA:  Left chest pain radiating to the back. Shortness of breath. Elevated D-dimer.  EXAM: CT ANGIOGRAPHY CHEST WITH CONTRAST  TECHNIQUE: Multidetector CT imaging of the chest was performed using the standard protocol during bolus administration of intravenous contrast. Multiplanar CT image reconstructions and MIPs were obtained to evaluate the vascular anatomy.  CONTRAST:  165mL OMNIPAQUE IOHEXOL 350 MG/ML SOLN  COMPARISON:  DG CHEST 2 VIEW dated 09/06/2013  FINDINGS: Visualization is technically limited due to the patient's body habitus. There is moderately good opacification of the central and proximal segmental pulmonary arteries. No focal filling defects are demonstrated. No evidence of significant pulmonary embolus. Normal heart size. Normal caliber thoracic aorta. No significant lymphadenopathy in the chest. Esophagus is decompressed. No pleural effusions. No pneumothorax. Airways appear patent. No focal consolidation in the lungs.  Review of the MIP images confirms the above findings.  IMPRESSION: No evidence of significant central pulmonary embolus.   Electronically Signed   By: Lucienne Capers M.D.   On: 09/07/2013 01:38   Room air pulse ox 97% is adequate   Date: 09/06/2013  Rate: 77  Rhythm: normal sinus rhythm  QRS Axis: normal  Intervals: normal  ST/T Wave abnormalities: nonspecific ST changes  Conduction Disutrbances:none  Narrative Interpretation:   Old EKG Reviewed: none available  PT declines any pain medication  Plan d/c home, follow up outpatient, referral for stress testing provided with strict return precautions verbalized as understood.    MDM   Dx: Chest pain  Adult female presents CP/ SOB/ Low grade fever and cough. Evaluated with CXR, ECG, labs including  serial troponins, and Ct Angio for elevated d-dimer.   Unrevealing ED work up as above. VS and nursing notes reviewed and considered.     Teressa Lower, MD 09/07/13 [phone]

## 2013-09-07 ENCOUNTER — Emergency Department (HOSPITAL_COMMUNITY): Payer: Self-pay

## 2013-09-07 ENCOUNTER — Emergency Department (HOSPITAL_COMMUNITY)
Admission: EM | Admit: 2013-09-07 | Discharge: 2013-09-07 | Disposition: A | Discharge status: Home / Self Care | Payer: Self-pay | Attending: Emergency Medicine | Admitting: Emergency Medicine

## 2013-09-07 ENCOUNTER — Encounter (HOSPITAL_COMMUNITY): Payer: Self-pay

## 2013-09-07 ENCOUNTER — Encounter (HOSPITAL_COMMUNITY): Payer: Self-pay | Admitting: Emergency Medicine

## 2013-09-07 DIAGNOSIS — Z8719 Personal history of other diseases of the digestive system: Secondary | ICD-10-CM | POA: Insufficient documentation

## 2013-09-07 DIAGNOSIS — R519 Headache, unspecified: Secondary | ICD-10-CM

## 2013-09-07 DIAGNOSIS — Z79899 Other long term (current) drug therapy: Secondary | ICD-10-CM | POA: Insufficient documentation

## 2013-09-07 DIAGNOSIS — Z87891 Personal history of nicotine dependence: Secondary | ICD-10-CM | POA: Insufficient documentation

## 2013-09-07 DIAGNOSIS — E282 Polycystic ovarian syndrome: Secondary | ICD-10-CM | POA: Insufficient documentation

## 2013-09-07 DIAGNOSIS — R51 Headache: Secondary | ICD-10-CM | POA: Insufficient documentation

## 2013-09-07 DIAGNOSIS — Z8669 Personal history of other diseases of the nervous system and sense organs: Secondary | ICD-10-CM | POA: Insufficient documentation

## 2013-09-07 LAB — BASIC METABOLIC PANEL
BUN: 8 mg/dL (ref 6–23)
CHLORIDE: 102 meq/L (ref 96–112)
CO2: 24 meq/L (ref 19–32)
CREATININE: 0.82 mg/dL (ref 0.50–1.10)
Calcium: 9.2 mg/dL (ref 8.4–10.5)
GFR calc non Af Amer: >90 mL/min (ref >90)
Glucose, Bld: 84 mg/dL (ref 70–99)
POTASSIUM: 4.1 meq/L (ref 3.7–5.3)
Sodium: 139 mEq/L (ref 137–147)

## 2013-09-07 LAB — I-STAT TROPONIN, ED: TROPONIN I, POC: 0 ng/mL (ref 0.00–0.08)

## 2013-09-07 LAB — D-DIMER, QUANTITATIVE (NOT AT ARMC): D DIMER QUANT: 0.67 ug{FEU}/mL — AB (ref 0.00–0.48)

## 2013-09-07 LAB — POC URINE PREG, ED: PREG TEST UR: NEGATIVE

## 2013-09-07 MED ORDER — KETOROLAC TROMETHAMINE 30 MG/ML IJ SOLN
30.0000 mg | Freq: Once | INTRAMUSCULAR | Status: AC
  Administered 2013-09-07: 30 mg via INTRAMUSCULAR
  Filled 2013-09-07: qty 1

## 2013-09-07 MED ORDER — IOHEXOL 350 MG/ML SOLN
100.0000 mL | Freq: Once | INTRAVENOUS | Status: AC | PRN
  Administered 2013-09-07: 100 mL via INTRAVENOUS

## 2013-09-07 MED ORDER — IBUPROFEN 600 MG PO TABS: 600.0000 mg | ORAL_TABLET | Freq: Four times a day (QID) | ORAL | Status: DC | PRN

## 2013-09-07 NOTE — ED Provider Notes (Signed)
Medical screening examination/treatment/procedure(s) were performed by non-physician practitioner and as supervising physician I was immediately available for consultation/collaboration.   EKG Interpretation None       Arzella Rehmann R. Khaila Velarde, MD 09/07/13 2337 

## 2013-09-07 NOTE — Discharge Instructions (Signed)
Chest Pain (Nonspecific) °It is often hard to give a specific diagnosis for the cause of chest pain. There is always a chance that your pain could be related to something serious, such as a heart attack or a blood clot in the lungs. You need to follow up with your caregiver for further evaluation. °CAUSES  °· Heartburn. °· Pneumonia or bronchitis. °· Anxiety or stress. °· Inflammation around your heart (pericarditis) or lung (pleuritis or pleurisy). °· A blood clot in the lung. °· A collapsed lung (pneumothorax). It can develop suddenly on its own (spontaneous pneumothorax) or from injury (trauma) to the chest. °· Shingles infection (herpes zoster virus). °The chest wall is composed of bones, muscles, and cartilage. Any of these can be the source of the pain. °· The bones can be bruised by injury. °· The muscles or cartilage can be strained by coughing or overwork. °· The cartilage can be affected by inflammation and become sore (costochondritis). °DIAGNOSIS  °Lab tests or other studies, such as X-rays, electrocardiography, stress testing, or cardiac imaging, may be needed to find the cause of your pain.  °TREATMENT  °· Treatment depends on what may be causing your chest pain. Treatment may include: °· Acid blockers for heartburn. °· Anti-inflammatory medicine. °· Pain medicine for inflammatory conditions. °· Antibiotics if an infection is present. °· You may be advised to change lifestyle habits. This includes stopping smoking and avoiding alcohol, caffeine, and chocolate. °· You may be advised to keep your head raised (elevated) when sleeping. This reduces the chance of acid going backward from your stomach into your esophagus. °· Most of the time, nonspecific chest pain will improve within 2 to 3 days with rest and mild pain medicine. °HOME CARE INSTRUCTIONS  °· If antibiotics were prescribed, take your antibiotics as directed. Finish them even if you start to feel better. °· For the next few days, avoid physical  activities that bring on chest pain. Continue physical activities as directed. °· Do not smoke. °· Avoid drinking alcohol. °· Only take over-the-counter or prescription medicine for pain, discomfort, or fever as directed by your caregiver. °· Follow your caregiver's suggestions for further testing if your chest pain does not go away. °· Keep any follow-up appointments you made. If you do not go to an appointment, you could develop lasting (chronic) problems with pain. If there is any problem keeping an appointment, you must call to reschedule. °SEEK MEDICAL CARE IF:  °· You think you are having problems from the medicine you are taking. Read your medicine instructions carefully. °· Your chest pain does not go away, even after treatment. °· You develop a rash with blisters on your chest. °SEEK IMMEDIATE MEDICAL CARE IF:  °· You have increased chest pain or pain that spreads to your arm, neck, jaw, back, or abdomen. °· You develop shortness of breath, an increasing cough, or you are coughing up blood. °· You have severe back or abdominal pain, feel nauseous, or vomit. °· You develop severe weakness, fainting, or chills. °· You have a fever. °THIS IS AN EMERGENCY. Do not wait to see if the pain will go away. Get medical help at once. Call your local emergency services (911 in [place] Do not drive yourself to the hospital. °MAKE SURE YOU:  °· Understand these instructions. °· Will watch your condition. °· Will get help right away if you are not doing well or get worse. °Document Released: 01/20/2005 Document Revised: 07/05/2011 Document Reviewed: 11/16/2007 °ExitCare® Patient Information ©2014 [employer]. ° °

## 2013-09-07 NOTE — ED Notes (Signed)
Pt arrived to the ED with a complaint of a headache.  Pt was seen here last night and evaluated for chest pain and shortness of breath.  Pt had a headache prior to dismissal but refused the tylenol offered.  Pt has taken Nyquil without relief.  Pt headache is generalized all over her head.

## 2013-09-07 NOTE — Discharge Instructions (Signed)
Headaches, Frequently Asked Questions MIGRAINE HEADACHES Q: What is migraine? What causes it? How can I treat it? A: Generally, migraine headaches begin as a dull ache. Then they develop into a constant, throbbing, and pulsating pain. You may experience pain at the temples. You may experience pain at the front or back of one or both sides of the head. The pain is usually accompanied by a combination of:  Nausea.  Vomiting.  Sensitivity to light and noise. Some people (about 15%) experience an aura (see below) before an attack. The cause of migraine is believed to be chemical reactions in the brain. Treatment for migraine may include over-the-counter or prescription medications. It may also include self-help techniques. These include relaxation training and biofeedback.  Q: What is an aura? A: About 15% of people with migraine get an "aura". This is a sign of neurological symptoms that occur before a migraine headache. You may see wavy or jagged lines, dots, or flashing lights. You might experience tunnel vision or blind spots in one or both eyes. The aura can include visual or auditory hallucinations (something imagined). It may include disruptions in smell (such as strange odors), taste or touch. Other symptoms include:  Numbness.  A "pins and needles" sensation.  Difficulty in recalling or speaking the correct word. These neurological events may last as long as 60 minutes. These symptoms will fade as the headache begins. Q: What is a trigger? A: Certain physical or environmental factors can lead to or "trigger" a migraine. These include:  Foods.  Hormonal changes.  Weather.  Stress. It is important to remember that triggers are different for everyone. To help prevent migraine attacks, you need to figure out which triggers affect you. Keep a headache diary. This is a good way to track triggers. The diary will help you talk to your healthcare professional about your condition. Q: Does  weather affect migraines? A: Bright sunshine, hot, humid conditions, and drastic changes in barometric pressure may lead to, or "trigger," a migraine attack in some people. But studies have shown that weather does not act as a trigger for everyone with migraines. Q: What is the link between migraine and hormones? A: Hormones start and regulate many of your body's functions. Hormones keep your body in balance within a constantly changing environment. The levels of hormones in your body are unbalanced at times. Examples are during menstruation, pregnancy, or menopause. That can lead to a migraine attack. In fact, about three quarters of all women with migraine report that their attacks are related to the menstrual cycle.  Q: Is there an increased risk of stroke for migraine sufferers? A: The likelihood of a migraine attack causing a stroke is very remote. That is not to say that migraine sufferers cannot have a stroke associated with their migraines. In persons under age 53, the most common associated factor for stroke is migraine headache. But over the course of a person's normal life span, the occurrence of migraine headache may actually be associated with a reduced risk of dying from cerebrovascular disease due to stroke.  Q: What are acute medications for migraine? A: Acute medications are used to treat the pain of the headache after it has started. Examples over-the-counter medications, NSAIDs, ergots, and triptans.  Q: What are the triptans? A: Triptans are the newest class of abortive medications. They are specifically targeted to treat migraine. Triptans are vasoconstrictors. They moderate some chemical reactions in the brain. The triptans work on receptors in your brain. Triptans help  to restore the balance of a neurotransmitter called serotonin. Fluctuations in levels of serotonin are thought to be a main cause of migraine.  °Q: Are over-the-counter medications for migraine effective? °A:  Over-the-counter, or "OTC," medications may be effective in relieving mild to moderate pain and associated symptoms of migraine. But you should see your caregiver before beginning any treatment regimen for migraine.  °Q: What are preventive medications for migraine? °A: Preventive medications for migraine are sometimes referred to as "prophylactic" treatments. They are used to reduce the frequency, severity, and length of migraine attacks. Examples of preventive medications include antiepileptic medications, antidepressants, beta-blockers, calcium channel blockers, and NSAIDs (nonsteroidal anti-inflammatory drugs). °Q: Why are anticonvulsants used to treat migraine? °A: During the past few years, there has been an increased interest in antiepileptic drugs for the prevention of migraine. They are sometimes referred to as "anticonvulsants". Both epilepsy and migraine may be caused by similar reactions in the brain.  °Q: Why are antidepressants used to treat migraine? °A: Antidepressants are typically used to treat people with depression. They may reduce migraine frequency by regulating chemical levels, such as serotonin, in the brain.  °Q: What alternative therapies are used to treat migraine? °A: The term "alternative therapies" is often used to describe treatments considered outside the scope of conventional Western medicine. Examples of alternative therapy include acupuncture, acupressure, and yoga. Another common alternative treatment is herbal therapy. Some herbs are believed to relieve headache pain. Always discuss alternative therapies with your caregiver before proceeding. Some herbal products contain arsenic and other toxins. °TENSION HEADACHES °Q: What is a tension-type headache? What causes it? How can I treat it? °A: Tension-type headaches occur randomly. They are often the result of temporary stress, anxiety, fatigue, or anger. Symptoms include soreness in your temples, a tightening band-like sensation  around your head (a "vice-like" ache). Symptoms can also include a pulling feeling, pressure sensations, and contracting head and neck muscles. The headache begins in your forehead, temples, or the back of your head and neck. Treatment for tension-type headache may include over-the-counter or prescription medications. Treatment may also include self-help techniques such as relaxation training and biofeedback. °CLUSTER HEADACHES °Q: What is a cluster headache? What causes it? How can I treat it? °A: Cluster headache gets its name because the attacks come in groups. The pain arrives with little, if any, warning. It is usually on one side of the head. A tearing or bloodshot eye and a runny nose on the same side of the headache may also accompany the pain. Cluster headaches are believed to be caused by chemical reactions in the brain. They have been described as the most severe and intense of any headache type. Treatment for cluster headache includes prescription medication and oxygen. °SINUS HEADACHES °Q: What is a sinus headache? What causes it? How can I treat it? °A: When a cavity in the bones of the face and skull (a sinus) becomes inflamed, the inflammation will cause localized pain. This condition is usually the result of an allergic reaction, a tumor, or an infection. If your headache is caused by a sinus blockage, such as an infection, you will probably have a fever. An x-ray will confirm a sinus blockage. Your caregiver's treatment might include antibiotics for the infection, as well as antihistamines or decongestants.  °REBOUND HEADACHES °Q: What is a rebound headache? What causes it? How can I treat it? °A: A pattern of taking acute headache medications too often can lead to a condition known as "rebound headache."   A pattern of taking too much headache medication includes taking it more than 2 days per week or in excessive amounts. That means more than the label or a caregiver advises. With rebound  headaches, your medications not only stop relieving pain, they actually begin to cause headaches. Doctors treat rebound headache by tapering the medication that is being overused. Sometimes your caregiver will gradually substitute a different type of treatment or medication. Stopping may be a challenge. Regularly overusing a medication increases the potential for serious side effects. Consult a caregiver if you regularly use headache medications more than 2 days per week or more than the label advises. ADDITIONAL QUESTIONS AND ANSWERS Q: What is biofeedback? A: Biofeedback is a self-help treatment. Biofeedback uses special equipment to monitor your body's involuntary physical responses. Biofeedback monitors:  Breathing.  Pulse.  Heart rate.  Temperature.  Muscle tension.  Brain activity. Biofeedback helps you refine and perfect your relaxation exercises. You learn to control the physical responses that are related to stress. Once the technique has been mastered, you do not need the equipment any more. Q: Are headaches hereditary? A: Four out of five (80%) of people that suffer report a family history of migraine. Scientists are not sure if this is genetic or a family predisposition. Despite the uncertainty, a child has a 50% chance of having migraine if one parent suffers. The child has a 75% chance if both parents suffer.  Q: Can children get headaches? A: By the time they reach high school, most young people have experienced some type of headache. Many safe and effective approaches or medications can prevent a headache from occurring or stop it after it has begun.  Q: What type of doctor should I see to diagnose and treat my headache? A: Start with your primary caregiver. Discuss his or her experience and approach to headaches. Discuss methods of classification, diagnosis, and treatment. Your caregiver may decide to recommend you to a headache specialist, depending upon your symptoms or other  physical conditions. Having diabetes, allergies, etc., may require a more comprehensive and inclusive approach to your headache. The National Headache Foundation will provide, upon request, a list of [facility] physician members in your state. Document Released: 07/03/2003 Document Revised: 07/05/2011 Document Reviewed: 12/11/2007 [facility] Patient Information 2014 [place]. Make sure to eat and drink fluids os an regular basis

## 2013-09-07 NOTE — ED Provider Notes (Signed)
CSN: [id number]     Arrival date & time 09/07/13  2134 History  This chart was scribed for non-physician practitioner, Junius Creamer, FNP,working with Jasper Riling. Alvino Chapel, MD, by Marlowe Kays, ED Scribe.  This patient was seen in room WTR5/WTR5 and the patient's care was started at 10:20 PM.  Chief Complaint  Patient presents with  . Headache   The history is provided by the patient. No language interpreter was used.   HPI Comments:  Diana Blair is a 33 y.o. obese female who presents to the Emergency Department complaining of worsening severe throbbing HA that started several hours ago before being discharged from here earlier this morning for a separate complaint. She states she was offered Tylenol before discharge, but refused because the pain was not bad. She states she has been taking NyQuil for her HA without relief. She denies any h/o HA. She denies congestion, vomiting, diarrhea, CP, or cough. Since DC this morning fro evaluation of Chest Pain has been sleeping at home.   Past Medical History  Diagnosis Date  . Seizure   . Diverticulitis   . PCOS (polycystic ovarian syndrome)   . Morbid obesity    Past Surgical History  Procedure Laterality Date  . No past surgeries    . Cervical cerclage  03/13/2012    Procedure: CERCLAGE CERVICAL;  Surgeon: Jonnie Kind, MD;  Location: [facility];  Service: Gynecology;  Laterality: N/A;  . Cervical cerclage  03/17/2012    Procedure: CERCLAGE CERVICAL;  Surgeon: Osborne Oman, MD;  Location: [facility];  Service: Gynecology;  Laterality: N/A;  Removal   Family History  Problem Relation Age of Onset  . Other Neg Hx   . Cancer Mother     lung  . Diabetes Father    History  Substance Use Topics  . Smoking status: Former Smoker    Types: Cigarettes    Quit date: 02/08/2009  . Smokeless tobacco: Never Used  . Alcohol Use: No   OB History   Grav Para Term Preterm Abortions TAB SAB Ect Mult Living   1 1 0 1 0 0 0 0 0 0      Review of  Systems  HENT: Negative for congestion and rhinorrhea.   Eyes: Negative for visual disturbance.  Respiratory: Negative for cough.   Cardiovascular: Negative for chest pain.  Gastrointestinal: Negative for nausea, vomiting and diarrhea.  Skin: Negative for rash and wound.  Neurological: Positive for headaches. Negative for dizziness.  All other systems reviewed and are negative.   Allergies  Review of patient's allergies indicates no known allergies.  Home Medications   Prior to Admission medications   Medication Sig Start Date End Date Taking? Authorizing Provider  metFORMIN (GLUCOPHAGE) 500 MG tablet Take 1 tablet (500 mg total) by mouth 2 (two) times daily with a meal. 12/28/12   Lavonia Drafts, MD   Triage Vitals: BP 124/103  Pulse 100  Temp(Src) 99.6 F (37.6 C) (Oral)  Resp 18  SpO2 95%  LMP 09/03/2013 Physical Exam  Nursing note and vitals reviewed. Constitutional: She is oriented to person, place, and time. She appears well-developed and well-nourished.  HENT:  Head: Normocephalic and atraumatic.  Right Ear: External ear normal.  Left Ear: External ear normal.  Nose: Right sinus exhibits no maxillary sinus tenderness and no frontal sinus tenderness. Left sinus exhibits no maxillary sinus tenderness and no frontal sinus tenderness.  Mouth/Throat: Oropharynx is clear and moist.  Eyes: EOM are normal. Pupils are equal,  round, and reactive to light.  Neck: Normal range of motion.  Cardiovascular: Normal rate.   Pulmonary/Chest: Effort normal.  Musculoskeletal: Normal range of motion.  Lymphadenopathy:    She has no cervical adenopathy.  Neurological: She is alert and oriented to person, place, and time. No cranial nerve deficit.  Skin: Skin is warm and dry. No rash noted.  Psychiatric: She has a normal mood and affect. Her behavior is normal.    ED Course  Procedures (including critical care time) DIAGNOSTIC STUDIES: Oxygen Saturation is 95% on RA, adequate  by my interpretation.   COORDINATION OF CARE: 10:23 PM- Will order Toradol. Pt verbalizes understanding and agrees to plan.  Medications  ketorolac (TORADOL) 30 MG/ML injection 30 mg (30 mg Intramuscular Given 09/07/13 2227)    Labs Review Labs Reviewed - No data to display  Imaging Review Dg Chest 2 View  09/06/2013   CLINICAL DATA:  Chest pain and shortness of breath.  EXAM: CHEST  2 VIEW  COMPARISON:  03/17/2012  FINDINGS: The heart size and mediastinal contours are within normal limits. Both lungs are clear. The visualized skeletal structures are unremarkable.  IMPRESSION: No active cardiopulmonary disease.   Electronically Signed   By: Lucienne Capers M.D.   On: 09/06/2013 23:49   Ct Angio Chest Pe W/cm &/or Wo Cm  09/07/2013   CLINICAL DATA:  Left chest pain radiating to the back. Shortness of breath. Elevated D-dimer.  EXAM: CT ANGIOGRAPHY CHEST WITH CONTRAST  TECHNIQUE: Multidetector CT imaging of the chest was performed using the standard protocol during bolus administration of intravenous contrast. Multiplanar CT image reconstructions and MIPs were obtained to evaluate the vascular anatomy.  CONTRAST:  135mL OMNIPAQUE IOHEXOL 350 MG/ML SOLN  COMPARISON:  DG CHEST 2 VIEW dated 09/06/2013  FINDINGS: Visualization is technically limited due to the patient's body habitus. There is moderately good opacification of the central and proximal segmental pulmonary arteries. No focal filling defects are demonstrated. No evidence of significant pulmonary embolus. Normal heart size. Normal caliber thoracic aorta. No significant lymphadenopathy in the chest. Esophagus is decompressed. No pleural effusions. No pneumothorax. Airways appear patent. No focal consolidation in the lungs.  Review of the MIP images confirms the above findings.  IMPRESSION: No evidence of significant central pulmonary embolus.   Electronically Signed   By: Lucienne Capers M.D.   On: 09/07/2013 01:38     EKG  Interpretation None      MDM  Pain has improved and vital signs have normalized patient has been encouraged to eat and drink on regular baisis Final diagnoses:  Headache       I personally performed the services described in this documentation, which was scribed in my presence. The recorded information has been reviewed and is accurate.    Garald Balding, NP 09/07/13 5625  Garald Balding, NP 09/07/13 6389  Garald Balding, NP 09/07/13 [phone]

## 2013-10-14 ENCOUNTER — Inpatient Hospital Stay (HOSPITAL_COMMUNITY)
Admission: AD | Admit: 2013-10-14 | Discharge: 2013-10-14 | Disposition: A | Discharge status: Home / Self Care | Payer: Medicaid Other | Source: Ambulatory Visit | Attending: Obstetrics & Gynecology | Admitting: Obstetrics & Gynecology

## 2013-10-14 ENCOUNTER — Encounter (HOSPITAL_COMMUNITY): Payer: Self-pay | Admitting: *Deleted

## 2013-10-14 DIAGNOSIS — R1110 Vomiting, unspecified: Secondary | ICD-10-CM | POA: Insufficient documentation

## 2013-10-14 DIAGNOSIS — Z87891 Personal history of nicotine dependence: Secondary | ICD-10-CM | POA: Insufficient documentation

## 2013-10-14 DIAGNOSIS — Z6841 Body Mass Index (BMI) 40.0 and over, adult: Secondary | ICD-10-CM | POA: Insufficient documentation

## 2013-10-14 DIAGNOSIS — N760 Acute vaginitis: Secondary | ICD-10-CM | POA: Insufficient documentation

## 2013-10-14 LAB — WET PREP, GENITAL
CLUE CELLS WET PREP: NONE SEEN
TRICH WET PREP: NONE SEEN
YEAST WET PREP: NONE SEEN

## 2013-10-14 LAB — POCT PREGNANCY, URINE: Preg Test, Ur: NEGATIVE

## 2013-10-14 LAB — URINALYSIS, ROUTINE W REFLEX MICROSCOPIC
Bilirubin Urine: NEGATIVE
Glucose, UA: NEGATIVE mg/dL
Ketones, ur: 15 mg/dL — AB
Leukocytes, UA: NEGATIVE
NITRITE: NEGATIVE
PH: 5.5 (ref 5.0–8.0)
Protein, ur: NEGATIVE mg/dL
SPECIFIC GRAVITY, URINE: 1.025 (ref 1.005–1.030)
Urobilinogen, UA: 0.2 mg/dL (ref 0.0–1.0)

## 2013-10-14 LAB — URINE MICROSCOPIC-ADD ON

## 2013-10-14 MED ORDER — FLUCONAZOLE 150 MG PO TABS
150.0000 mg | ORAL_TABLET | Freq: Once | ORAL | Status: AC
  Administered 2013-10-14: 150 mg via ORAL
  Filled 2013-10-14: qty 1

## 2013-10-14 NOTE — MAU Provider Note (Signed)
History     CSN: [id number]  Arrival date and time: 10/14/13 8588   First Provider Initiated Contact with Patient 10/14/13 2013      Chief Complaint  Patient presents with  . Possible Pregnancy  . Vaginitis   Possible Pregnancy Associated symptoms include vomiting. Pertinent negatives include no nausea.    Pt is a 33 yo G1P0100 here with report of white thick discharge x one week.   Denies vaginal itching or odor.  Also concerned about late menstrual cycle.  Patient's last menstrual period was 08/31/2013.  Concerned about possible pregnancy.     Past Medical History  Diagnosis Date  . Seizure   . Diverticulitis   . PCOS (polycystic ovarian syndrome)   . Morbid obesity     Past Surgical History  Procedure Laterality Date  . Cervical cerclage  03/13/2012    Procedure: CERCLAGE CERVICAL;  Surgeon: Jonnie Kind, MD;  Location: [facility];  Service: Gynecology;  Laterality: N/A;  . Cervical cerclage  03/17/2012    Procedure: CERCLAGE CERVICAL;  Surgeon: Osborne Oman, MD;  Location: [facility];  Service: Gynecology;  Laterality: N/A;  Removal    Family History  Problem Relation Age of Onset  . Other Neg Hx   . Cancer Mother     lung  . Diabetes Father     History  Substance Use Topics  . Smoking status: Former Smoker    Types: Cigarettes    Quit date: 02/08/2009  . Smokeless tobacco: Never Used  . Alcohol Use: No    Allergies: No Known Allergies  No prescriptions prior to admission    Review of Systems  Gastrointestinal: Positive for vomiting. Negative for nausea.  Genitourinary:       Vaginal discharge  All other systems reviewed and are negative.  Physical Exam   Blood pressure 121/69, pulse 95, temperature 99.8 F (37.7 C), temperature source Oral, resp. rate 22, height 5\' 4"  (1.626 m), weight 167.831 kg (370 lb), last menstrual period 08/31/2013.  Physical Exam  Constitutional: She is oriented to person, place, and time. She appears well-developed  and well-nourished. No distress.  HENT:  Head: Normocephalic.  Neck: Normal range of motion. Neck supple.  Cardiovascular: Normal rate, regular rhythm and normal heart sounds.   Respiratory: Effort normal and breath sounds normal. No respiratory distress.  GI: Soft. There is no tenderness. There is no CVA tenderness.  Genitourinary: Cervix exhibits no motion tenderness and no discharge. Vaginal discharge (white, creamy) found.  Musculoskeletal: Normal range of motion.  Neurological: She is alert and oriented to person, place, and time.  Skin: Skin is warm and dry.  Psychiatric: She has a normal mood and affect.    MAU Course  Procedures  Results for orders placed during the hospital encounter of 10/14/13 (from the past 24 hour(s))  URINALYSIS, ROUTINE W REFLEX MICROSCOPIC     Status: Abnormal   Collection Time    10/14/13  7:43 PM      Result Value Ref Range   Color, Urine YELLOW  YELLOW   APPearance CLEAR  CLEAR   Specific Gravity, Urine 1.025  1.005 - 1.030   pH 5.5  5.0 - 8.0   Glucose, UA NEGATIVE  NEGATIVE mg/dL   Hgb urine dipstick SMALL (*) NEGATIVE   Bilirubin Urine NEGATIVE  NEGATIVE   Ketones, ur 15 (*) NEGATIVE mg/dL   Protein, ur NEGATIVE  NEGATIVE mg/dL   Urobilinogen, UA 0.2  0.0 - 1.0 mg/dL   Nitrite  NEGATIVE  NEGATIVE   Leukocytes, UA NEGATIVE  NEGATIVE  URINE MICROSCOPIC-ADD ON     Status: None   Collection Time    10/14/13  7:43 PM      Result Value Ref Range   Squamous Epithelial / LPF RARE  RARE   WBC, UA 0-2  <3 WBC/hpf   RBC / HPF 0-2  <3 RBC/hpf   Bacteria, UA RARE  RARE   Urine-Other MUCOUS PRESENT    POCT PREGNANCY, URINE     Status: None   Collection Time    10/14/13  8:18 PM      Result Value Ref Range   Preg Test, Ur NEGATIVE  NEGATIVE  WET PREP, GENITAL     Status: Abnormal   Collection Time    10/14/13  8:28 PM      Result Value Ref Range   Yeast Wet Prep HPF POC NONE SEEN  NONE SEEN   Trich, Wet Prep NONE SEEN  NONE SEEN   Clue  Cells Wet Prep HPF POC NONE SEEN  NONE SEEN   WBC, Wet Prep HPF POC FEW (*) NONE SEEN    Assessment and Plan  Vaginitis  Plan: Discharge to home Reviewed possible irritants Diflucan 150 mg PO in MAU GC/CT pending  [facility] 10/14/2013, 8:15 PM

## 2013-10-14 NOTE — MAU Note (Signed)
Patient presents with complaint of 1 missed cycle and a possible vaginal infection.

## 2013-10-15 LAB — GC/CHLAMYDIA PROBE AMP
CT Probe RNA: NEGATIVE
GC Probe RNA: NEGATIVE

## 2013-11-12 ENCOUNTER — Telehealth: Payer: Self-pay | Admitting: *Deleted

## 2013-11-12 NOTE — Telephone Encounter (Signed)
Pt called clinic requesting call back to discuss medication that would help her have a cycle.  She thinks she needs this medication before her appointment in August.  Attempted to contact patient , no answer, left message for patient to contact clinic.

## 2013-11-15 NOTE — Telephone Encounter (Signed)
Called pt and left message with "little brother"  that this is our second attempt if she continues to have any questions to please give [place] a call.

## 2013-12-24 ENCOUNTER — Ambulatory Visit (INDEPENDENT_AMBULATORY_CARE_PROVIDER_SITE_OTHER): Payer: Self-pay | Admitting: Obstetrics & Gynecology

## 2013-12-24 ENCOUNTER — Encounter: Payer: Self-pay | Admitting: Obstetrics & Gynecology

## 2013-12-24 VITALS — BP 109/55 | HR 81 | Temp 98.7°F | Ht 63.0 in | Wt 373.8 lb

## 2013-12-24 DIAGNOSIS — N760 Acute vaginitis: Secondary | ICD-10-CM

## 2013-12-24 DIAGNOSIS — N920 Excessive and frequent menstruation with regular cycle: Secondary | ICD-10-CM

## 2013-12-24 DIAGNOSIS — A499 Bacterial infection, unspecified: Secondary | ICD-10-CM

## 2013-12-24 DIAGNOSIS — B9689 Other specified bacterial agents as the cause of diseases classified elsewhere: Secondary | ICD-10-CM

## 2013-12-24 MED ORDER — METRONIDAZOLE 500 MG PO TABS: 500.0000 mg | ORAL_TABLET | Freq: Two times a day (BID) | ORAL | Status: AC

## 2013-12-24 MED ORDER — METRONIDAZOLE 500 MG PO TABS: 500.0000 mg | ORAL_TABLET | Freq: Two times a day (BID) | ORAL | Status: DC

## 2013-12-24 NOTE — Addendum Note (Signed)
Addended by: Shelly Coss on: 12/24/2013 04:16 PM   Modules accepted: Orders

## 2013-12-24 NOTE — Patient Instructions (Signed)
Bacterial Vaginosis Bacterial vaginosis is a vaginal infection that occurs when the normal balance of bacteria in the vagina is disrupted. It results from an overgrowth of certain bacteria. This is the most common vaginal infection in women of childbearing age. Treatment is important to prevent complications, especially in pregnant women, as it can cause a premature delivery. CAUSES  Bacterial vaginosis is caused by an increase in harmful bacteria that are normally present in smaller amounts in the vagina. Several different kinds of bacteria can cause bacterial vaginosis. However, the reason that the condition develops is not fully understood. RISK FACTORS Certain activities or behaviors can put you at an increased risk of developing bacterial vaginosis, including:  Having a new sex partner or multiple sex partners.  Douching.  Using an intrauterine device (IUD) for contraception. Women do not get bacterial vaginosis from toilet seats, bedding, swimming pools, or contact with objects around them. SIGNS AND SYMPTOMS  Some women with bacterial vaginosis have no signs or symptoms. Common symptoms include:  Grey vaginal discharge.  A fishlike odor with discharge, especially after sexual intercourse.  Itching or burning of the vagina and vulva.  Burning or pain with urination. DIAGNOSIS  Your health care provider will take a medical history and examine the vagina for signs of bacterial vaginosis. A sample of vaginal fluid may be taken. Your health care provider will look at this sample under a microscope to check for bacteria and abnormal cells. A vaginal pH test may also be done.  TREATMENT  Bacterial vaginosis may be treated with antibiotic medicines. These may be given in the form of a pill or a vaginal cream. A second round of antibiotics may be prescribed if the condition comes back after treatment.  HOME CARE INSTRUCTIONS   Only take over-the-counter or prescription medicines as  directed by your health care provider.  If antibiotic medicine was prescribed, take it as directed. Make sure you finish it even if you start to feel better.  Do not have sex until treatment is completed.  Tell all sexual partners that you have a vaginal infection. They should see their health care provider and be treated if they have problems, such as a mild rash or itching.  Practice safe sex by using condoms and only having one sex partner. SEEK MEDICAL CARE IF:   Your symptoms are not improving after 3 days of treatment.  You have increased discharge or pain.  You have a fever. MAKE SURE YOU:   Understand these instructions.  Will watch your condition.  Will get help right away if you are not doing well or get worse. FOR MORE INFORMATION  Centers for Disease Control and Prevention, Division of STD Prevention: www.cdc.gov/std American Sexual Health Association (ASHA): www.ashastd.org  Document Released: 04/12/2005 Document Revised: 01/31/2013 Document Reviewed: 11/22/2012 ExitCare Patient Information 2015 [employer]. This information is not intended to replace advice given to you by your health care provider. Make sure you discuss any questions you have with your health care provider.  

## 2013-12-24 NOTE — Progress Notes (Signed)
Subjective:     Patient ID: Diana Blair, female   DOB: 06-08-1980, 33 y.o.   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  Pt expresses frustration regarding this.  She feels like it is interfering with her daily activities.  Review of Systems     Objective:   Physical Exam BP 109/55  Pulse 81  Temp(Src) 98.7 F (37.1 C)  Ht 5\' 3"  (1.6 m)  Wt 373 lb 12.8 oz (169.555 kg)  BMI 66.23 kg/m2  LMP 12/06/2013  Breastfeeding? No Pt in NAD GU: EGBUS: no lesions Vagina: no blood in vault; thin grey discharge in vault Cervix: no lesion; no mucopurulent d/c Uterus: small, mobile Adnexa: no masses; sl tender         Assessment:     BV- recurrent     Plan:     Complete financial aid paperwork   Pelvic sono Flagyl 500mg  bid x 7 days

## 2013-12-24 NOTE — Progress Notes (Signed)
Pt continues to have vaginal discomfort.

## 2013-12-25 LAB — WET PREP, GENITAL
Trich, Wet Prep: NONE SEEN
WBC, Wet Prep HPF POC: NONE SEEN
YEAST WET PREP: NONE SEEN

## 2014-01-25 ENCOUNTER — Ambulatory Visit (HOSPITAL_COMMUNITY): Admission: RE | Admit: 2014-01-25 | Payer: Self-pay | Source: Ambulatory Visit

## 2014-02-17 ENCOUNTER — Inpatient Hospital Stay (HOSPITAL_COMMUNITY)
Admission: AD | Admit: 2014-02-17 | Discharge: 2014-02-17 | Disposition: A | Discharge status: Home / Self Care | Payer: Self-pay | Source: Ambulatory Visit | Attending: Obstetrics & Gynecology | Admitting: Obstetrics & Gynecology

## 2014-02-17 DIAGNOSIS — N926 Irregular menstruation, unspecified: Secondary | ICD-10-CM | POA: Insufficient documentation

## 2014-02-17 DIAGNOSIS — Z3202 Encounter for pregnancy test, result negative: Secondary | ICD-10-CM | POA: Insufficient documentation

## 2014-02-17 DIAGNOSIS — R102 Pelvic and perineal pain: Secondary | ICD-10-CM | POA: Insufficient documentation

## 2014-02-17 LAB — URINALYSIS, ROUTINE W REFLEX MICROSCOPIC
BILIRUBIN URINE: NEGATIVE
Glucose, UA: NEGATIVE mg/dL
KETONES UR: NEGATIVE mg/dL
LEUKOCYTES UA: NEGATIVE
Nitrite: NEGATIVE
PH: 6 (ref 5.0–8.0)
Protein, ur: NEGATIVE mg/dL
Specific Gravity, Urine: 1.025 (ref 1.005–1.030)
Urobilinogen, UA: 0.2 mg/dL (ref 0.0–1.0)

## 2014-02-17 LAB — URINE MICROSCOPIC-ADD ON

## 2014-02-17 LAB — POCT PREGNANCY, URINE: Preg Test, Ur: NEGATIVE

## 2014-02-17 NOTE — MAU Provider Note (Signed)
Subjective:  [name] is a 33 y.o. female non pregnant who presents to MAU for a pregnancy test and requests an RX for metformin. Pt has not had her cycle in a few months and wants an RX for metformin.  In August she was scheduled to be seen in the clinic and missed her appointment. She was scheduled to be seen in August for metformin,  and wants to know why I cannot prescribe metformin today. She has PCOS and she says metformin makes her bleed. She is trying to regulate her cycles in order to get pregnant.  Patient has occasional abdominal/ pelvic pressure, denies pain at this time.   Objective:   GENERAL: Well-developed, well-nourished female in no acute distress.  HEENT: Normocephalic, atraumatic.   LUNGS: Effort normal HEART: Regular rate  SKIN: Warm, dry and without erythema PSYCH: Normal mood and affect  Filed Vitals:   02/17/14 0255  BP: 143/80  Pulse: 90  Temp: 98.8 F (37.1 C)  Resp: 20  Height: 5\' 3"  (1.6 m)  Weight: 170.371 kg (375 lb 9.6 oz)   Results for orders placed during the hospital encounter of 02/17/14 (from the past 48 hour(s))  URINALYSIS, ROUTINE W REFLEX MICROSCOPIC     Status: Abnormal   Collection Time    02/17/14  3:03 AM      Result Value Ref Range   Color, Urine YELLOW  YELLOW   APPearance CLEAR  CLEAR   Specific Gravity, Urine 1.025  1.005 - 1.030   pH 6.0  5.0 - 8.0   Glucose, UA NEGATIVE  NEGATIVE mg/dL   Hgb urine dipstick MODERATE (*) NEGATIVE   Bilirubin Urine NEGATIVE  NEGATIVE   Ketones, ur NEGATIVE  NEGATIVE mg/dL   Protein, ur NEGATIVE  NEGATIVE mg/dL   Urobilinogen, UA 0.2  0.0 - 1.0 mg/dL   Nitrite NEGATIVE  NEGATIVE   Leukocytes, UA NEGATIVE  NEGATIVE  URINE MICROSCOPIC-ADD ON     Status: None   Collection Time    02/17/14  3:03 AM      Result Value Ref Range   Squamous Epithelial / LPF RARE  RARE   WBC, UA 0-2  <3 WBC/hpf   RBC / HPF 0-2  <3 RBC/hpf   Bacteria, UA RARE  RARE  POCT PREGNANCY, URINE     Status: None    Collection Time    02/17/14  3:32 AM      Result Value Ref Range   Preg Test, Ur NEGATIVE  NEGATIVE   Comment:            THE SENSITIVITY OF THIS     METHODOLOGY IS >24 mIU/mL    Assessment:  1. Missed period   2. Pelvic pressure in female    Plan: Discharge home in stable condition Follow up in the clinic, It is not appropriate to RX metformin in ER At home pregnancy tests encouraged.     Darrelyn Hillock Rasch, NP 02/18/2014 6:04 PM

## 2014-02-17 NOTE — MAU Note (Signed)
Missed two periods and starting to feel sick. Back and legs hurting. Have PCOS. LMP in August. Had pink spotting few days ago.

## 2014-02-25 ENCOUNTER — Encounter: Payer: Self-pay | Admitting: Obstetrics & Gynecology

## 2014-02-25 ENCOUNTER — Ambulatory Visit (HOSPITAL_COMMUNITY)
Admission: RE | Admit: 2014-02-25 | Discharge: 2014-02-25 | Disposition: A | Discharge status: Home / Self Care | Payer: Self-pay | Source: Ambulatory Visit | Attending: Obstetrics & Gynecology | Admitting: Obstetrics & Gynecology

## 2014-02-25 DIAGNOSIS — N920 Excessive and frequent menstruation with regular cycle: Secondary | ICD-10-CM | POA: Insufficient documentation

## 2014-02-25 DIAGNOSIS — R188 Other ascites: Secondary | ICD-10-CM | POA: Insufficient documentation

## 2014-02-25 DIAGNOSIS — E282 Polycystic ovarian syndrome: Secondary | ICD-10-CM | POA: Insufficient documentation

## 2014-02-26 ENCOUNTER — Telehealth: Payer: Self-pay

## 2014-02-26 NOTE — Telephone Encounter (Signed)
Patient called stating she would like to speak to a RN regarding previous treatment she has received here.

## 2014-02-27 NOTE — Telephone Encounter (Signed)
Attempted to contact patient. No answer. Left message stating we are returning your call from yesterday, please call clinic.

## 2014-03-25 ENCOUNTER — Ambulatory Visit: Payer: Self-pay | Admitting: Family Medicine

## 2014-04-08 ENCOUNTER — Encounter: Payer: Self-pay | Admitting: Obstetrics & Gynecology

## 2014-04-08 ENCOUNTER — Ambulatory Visit (INDEPENDENT_AMBULATORY_CARE_PROVIDER_SITE_OTHER): Payer: Self-pay | Admitting: Obstetrics & Gynecology

## 2014-04-08 VITALS — BP 97/52 | HR 101 | Temp 98.6°F | Ht 63.0 in | Wt 327.7 lb

## 2014-04-08 DIAGNOSIS — N760 Acute vaginitis: Secondary | ICD-10-CM

## 2014-04-08 DIAGNOSIS — B9689 Other specified bacterial agents as the cause of diseases classified elsewhere: Secondary | ICD-10-CM

## 2014-04-08 DIAGNOSIS — N915 Oligomenorrhea, unspecified: Secondary | ICD-10-CM

## 2014-04-08 DIAGNOSIS — A499 Bacterial infection, unspecified: Secondary | ICD-10-CM

## 2014-04-08 LAB — POCT PREGNANCY, URINE: Preg Test, Ur: NEGATIVE

## 2014-04-08 MED ORDER — METRONIDAZOLE 500 MG PO TABS: 500.0000 mg | ORAL_TABLET | Freq: Two times a day (BID) | ORAL | Status: AC

## 2014-04-08 MED ORDER — METRONIDAZOLE 0.75 % VA GEL: 1.0000 | Freq: Every day | VAGINAL | Status: DC

## 2014-04-08 NOTE — Progress Notes (Signed)
Subjective:     Patient ID: Diana Blair, female   DOB: 1980/11/17, 33 y.o.   MRN: [id number]  HPIPt reports that her menses have not come for several months.  She did not bring her menstrual calendar with her.  She has already been diagnosed with recurrent BV and with PCOS.   Review of Systems     Objective:   Physical Exam BP 97/52 mmHg  Pulse 101  Temp(Src) 98.6 F (37 C)  Ht 5\' 3"  (1.6 m)  Wt 327 lb 11.2 oz (148.644 kg)  BMI 58.06 kg/m2  LMP 02/07/2014  Exam deferred    Assessment:     Oligomenorrhea- PCOS.  Will check HbA1C.  Pt will hold off Metformin for now  Recurrent BV     Plan:     HbA1C today Flagyl 500mg  po bid x 7 days After completing the pills will place on Metrogel  metrogel 1 applicatorful qod Condoms

## 2014-04-08 NOTE — Patient Instructions (Signed)
Bacterial Vaginosis Bacterial vaginosis is a vaginal infection that occurs when the normal balance of bacteria in the vagina is disrupted. It results from an overgrowth of certain bacteria. This is the most common vaginal infection in women of childbearing age. Treatment is important to prevent complications, especially in pregnant women, as it can cause a premature delivery. CAUSES  Bacterial vaginosis is caused by an increase in harmful bacteria that are normally present in smaller amounts in the vagina. Several different kinds of bacteria can cause bacterial vaginosis. However, the reason that the condition develops is not fully understood. RISK FACTORS Certain activities or behaviors can put you at an increased risk of developing bacterial vaginosis, including:  Having a new sex partner or multiple sex partners.  Douching.  Using an intrauterine device (IUD) for contraception. Women do not get bacterial vaginosis from toilet seats, bedding, swimming pools, or contact with objects around them. SIGNS AND SYMPTOMS  Some women with bacterial vaginosis have no signs or symptoms. Common symptoms include:  Grey vaginal discharge.  A fishlike odor with discharge, especially after sexual intercourse.  Itching or burning of the vagina and vulva.  Burning or pain with urination. DIAGNOSIS  Your health care provider will take a medical history and examine the vagina for signs of bacterial vaginosis. A sample of vaginal fluid may be taken. Your health care provider will look at this sample under a microscope to check for bacteria and abnormal cells. A vaginal pH test may also be done.  TREATMENT  Bacterial vaginosis may be treated with antibiotic medicines. These may be given in the form of a pill or a vaginal cream. A second round of antibiotics may be prescribed if the condition comes back after treatment.  HOME CARE INSTRUCTIONS   Only take over-the-counter or prescription medicines as  directed by your health care provider.  If antibiotic medicine was prescribed, take it as directed. Make sure you finish it even if you start to feel better.  Do not have sex until treatment is completed.  Tell all sexual partners that you have a vaginal infection. They should see their health care provider and be treated if they have problems, such as a mild rash or itching.  Practice safe sex by using condoms and only having one sex partner. SEEK MEDICAL CARE IF:   Your symptoms are not improving after 3 days of treatment.  You have increased discharge or pain.  You have a fever. MAKE SURE YOU:   Understand these instructions.  Will watch your condition.  Will get help right away if you are not doing well or get worse. FOR MORE INFORMATION  Centers for Disease Control and Prevention, Division of STD Prevention: www.cdc.gov/std American Sexual Health Association (ASHA): www.ashastd.org  Document Released: 04/12/2005 Document Revised: 01/31/2013 Document Reviewed: 11/22/2012 ExitCare Patient Information 2015 [employer]. This information is not intended to replace advice given to you by your health care provider. Make sure you discuss any questions you have with your health care provider.  

## 2014-04-09 LAB — HEMOGLOBIN A1C
HEMOGLOBIN A1C: 5.9 % — AB (ref <5.7)
Mean Plasma Glucose: 123 mg/dL — ABNORMAL HIGH (ref <117)

## 2014-04-22 ENCOUNTER — Other Ambulatory Visit: Payer: Self-pay | Admitting: Family Medicine

## 2014-04-22 MED ORDER — METRONIDAZOLE 500 MG PO TABS: 500.0000 mg | ORAL_TABLET | Freq: Two times a day (BID) | ORAL | Status: DC

## 2014-05-09 ENCOUNTER — Encounter (HOSPITAL_COMMUNITY): Payer: Self-pay | Admitting: Obstetrics & Gynecology

## 2014-05-17 ENCOUNTER — Ambulatory Visit: Payer: Self-pay

## 2014-05-20 ENCOUNTER — Ambulatory Visit: Payer: Self-pay | Attending: Internal Medicine

## 2014-06-04 ENCOUNTER — Telehealth: Payer: Self-pay | Admitting: *Deleted

## 2014-06-04 MED ORDER — FLUCONAZOLE 150 MG PO TABS
150.0000 mg | ORAL_TABLET | Freq: Once | ORAL | Status: DC
Start: 2014-06-04 — End: 2014-10-31

## 2014-06-04 NOTE — Telephone Encounter (Signed)
Pt left message requesting Rx to be called in.  She did not give specific medication information. I called pt and discussed her request. She states she has recurrent BV and has been using hypoallergenic condoms as advised by Dr. Ihor Dow. She now thinks she is developing a vaginal yeast infection and requests treatment. She has vaginal itching and no discharge at present.  Rx for diflucan sent to pharmacy.  Pt advised to call back if her sx become worse. She voiced understanding.

## 2014-08-08 ENCOUNTER — Inpatient Hospital Stay (HOSPITAL_COMMUNITY)
Admission: AD | Admit: 2014-08-08 | Discharge: 2014-08-09 | Disposition: A | Discharge status: Home / Self Care | Payer: Self-pay | Source: Ambulatory Visit | Attending: Obstetrics & Gynecology | Admitting: Obstetrics & Gynecology

## 2014-08-08 DIAGNOSIS — Z6841 Body Mass Index (BMI) 40.0 and over, adult: Secondary | ICD-10-CM | POA: Insufficient documentation

## 2014-08-08 DIAGNOSIS — R102 Pelvic and perineal pain: Secondary | ICD-10-CM | POA: Insufficient documentation

## 2014-08-08 DIAGNOSIS — Z87891 Personal history of nicotine dependence: Secondary | ICD-10-CM | POA: Insufficient documentation

## 2014-08-08 DIAGNOSIS — R1031 Right lower quadrant pain: Secondary | ICD-10-CM | POA: Insufficient documentation

## 2014-08-08 NOTE — MAU Note (Addendum)
PT SAYS  SHE HAS OFF/ ON VAG BLEEDING - UPT- WAS NEG    IN DEC.      NO CYCLE - SINCE  NOV.      SHE  SEES SPOTTING  WHEN  SHE WIPES  SOMETIMES.  HAS LOWER ABD RIGHT  PAIN -  STARTED  MON- OR  Tuesday.  GOES TO CLINIC   DOWNSTAIRS-  LAST SEEN IN DEC.  NO BIRTH  CONTROL.    LAST SEX-   JAN

## 2014-08-09 ENCOUNTER — Encounter (HOSPITAL_COMMUNITY): Payer: Self-pay

## 2014-08-09 DIAGNOSIS — R102 Pelvic and perineal pain: Secondary | ICD-10-CM

## 2014-08-09 LAB — URINE MICROSCOPIC-ADD ON

## 2014-08-09 LAB — CBC
HCT: 40.1 % (ref 36.0–46.0)
Hemoglobin: 13.4 g/dL (ref 12.0–15.0)
MCH: 27.9 pg (ref 26.0–34.0)
MCHC: 33.4 g/dL (ref 30.0–36.0)
MCV: 83.4 fL (ref 78.0–100.0)
Platelets: 238 10*3/uL (ref 150–400)
RBC: 4.81 MIL/uL (ref 3.87–5.11)
RDW: 15.6 % — ABNORMAL HIGH (ref 11.5–15.5)
WBC: 8.2 10*3/uL (ref 4.0–10.5)

## 2014-08-09 LAB — URINALYSIS, ROUTINE W REFLEX MICROSCOPIC
BILIRUBIN URINE: NEGATIVE
GLUCOSE, UA: NEGATIVE mg/dL
Ketones, ur: NEGATIVE mg/dL
Leukocytes, UA: NEGATIVE
Nitrite: NEGATIVE
PROTEIN: NEGATIVE mg/dL
Specific Gravity, Urine: >1.03 — ABNORMAL HIGH (ref 1.005–1.030)
UROBILINOGEN UA: 0.2 mg/dL (ref 0.0–1.0)
pH: 6 (ref 5.0–8.0)

## 2014-08-09 LAB — WET PREP, GENITAL
Clue Cells Wet Prep HPF POC: NONE SEEN
Trich, Wet Prep: NONE SEEN
Yeast Wet Prep HPF POC: NONE SEEN

## 2014-08-09 LAB — GC/CHLAMYDIA PROBE AMP (~~LOC~~) NOT AT ARMC
CHLAMYDIA, DNA PROBE: NEGATIVE
NEISSERIA GONORRHEA: NEGATIVE

## 2014-08-09 LAB — POCT PREGNANCY, URINE: PREG TEST UR: NEGATIVE

## 2014-08-09 LAB — HIV ANTIBODY (ROUTINE TESTING W REFLEX): HIV Screen 4th Generation wRfx: NONREACTIVE

## 2014-08-09 MED ORDER — IBUPROFEN 800 MG PO TABS: 800.0000 mg | ORAL_TABLET | Freq: Three times a day (TID) | ORAL | Status: DC | PRN

## 2014-08-09 MED ORDER — IBUPROFEN 800 MG PO TABS
800.0000 mg | ORAL_TABLET | Freq: Once | ORAL | Status: AC
  Administered 2014-08-09: 800 mg via ORAL
  Filled 2014-08-09: qty 1

## 2014-08-09 NOTE — MAU Provider Note (Signed)
History     CSN: [id number]  Arrival date and time: 08/08/14 2328   First Provider Initiated Contact with Patient 08/09/14 0023      No chief complaint on file.  HPI Comments: Diana Blair is a 34 y.o. G1P0100 who presents today with RLQ pain x 2 days. She states that the pain waxes and wanes, and changing positions will help. She has not had a normal cycle this year, she has a hx of PCOS. She states that she has been spotting in February or March. She has been spotting off and on since then. She also has recurrent BV infections.   Abdominal Pain This is a new problem. The current episode started in the past 7 days. The onset quality is sudden. The problem has been unchanged. The pain is located in the RLQ. The pain is at a severity of 2/10. The quality of the pain is cramping. Pertinent negatives include no constipation (last BM 08/08/14), diarrhea, dysuria, fever, frequency, nausea or vomiting. Exacerbated by: standing a lot.  The pain is relieved by movement. She has tried nothing for the symptoms.      Past Medical History  Diagnosis Date  . Seizure   . Diverticulitis   . PCOS (polycystic ovarian syndrome)   . Morbid obesity     Past Surgical History  Procedure Laterality Date  . Cervical cerclage  03/13/2012    Procedure: CERCLAGE CERVICAL;  Surgeon: Jonnie Kind, MD;  Location: [facility];  Service: Gynecology;  Laterality: N/A;  . Cervical cerclage  03/17/2012    Procedure: CERCLAGE CERVICAL;  Surgeon: Osborne Oman, MD;  Location: [facility];  Service: Gynecology;  Laterality: N/A;  Removal    Family History  Problem Relation Age of Onset  . Other Neg Hx   . Cancer Mother     lung  . Hypertension Mother   . Diabetes Father     History  Substance Use Topics  . Smoking status: Former Smoker    Types: Cigarettes    Quit date: 02/08/2009  . Smokeless tobacco: Never Used  . Alcohol Use: No    Allergies: No Known Allergies  Prescriptions prior to admission   Medication Sig Dispense Refill Last Dose  . fluconazole (DIFLUCAN) 150 MG tablet Take 1 tablet (150 mg total) by mouth once. 1 tablet 0   . metroNIDAZOLE (FLAGYL) 500 MG tablet Take 1 tablet (500 mg total) by mouth 2 (two) times daily. 14 tablet 0   . metroNIDAZOLE (METROGEL) 0.75 % vaginal gel Place 1 Applicatorful vaginally at bedtime. Apply one applicatorful to vagina at bedtime for 5 days 70 g 3     Review of Systems  Constitutional: Negative for fever.  Gastrointestinal: Positive for abdominal pain. Negative for nausea, vomiting, diarrhea and constipation (last BM 08/08/14).  Genitourinary: Positive for urgency. Negative for dysuria and frequency.   Physical Exam   Blood pressure 144/80, pulse 88, temperature 98.5 F (36.9 C), temperature source Oral, resp. rate 18, height 5\' 3"  (1.6 m), weight 170.326 kg (375 lb 8 oz).  Physical Exam  Nursing note and vitals reviewed. Constitutional: She is oriented to person, place, and time. She appears well-developed and well-nourished. No distress.  Cardiovascular: Normal rate.   Respiratory: Effort normal.  GI: Soft. There is no tenderness. There is no rebound.  Genitourinary:   External: no lesion Vagina: small amount of white discharge Cervix: pink, smooth, no CMT Uterus: NSSC Adnexa: NT  Neurological: She is alert and oriented to  person, place, and time.  Skin: Skin is warm and dry.  Psychiatric: She has a normal mood and affect.   Results for orders placed or performed during the hospital encounter of 08/08/14 (from the past 24 hour(s))  Urinalysis, Routine w reflex microscopic     Status: Abnormal   Collection Time: 08/08/14 11:50 PM  Result Value Ref Range   Color, Urine YELLOW YELLOW   APPearance CLEAR CLEAR   Specific Gravity, Urine >1.030 (H) 1.005 - 1.030   pH 6.0 5.0 - 8.0   Glucose, UA NEGATIVE NEGATIVE mg/dL   Hgb urine dipstick MODERATE (A) NEGATIVE   Bilirubin Urine NEGATIVE NEGATIVE   Ketones, ur NEGATIVE  NEGATIVE mg/dL   Protein, ur NEGATIVE NEGATIVE mg/dL   Urobilinogen, UA 0.2 0.0 - 1.0 mg/dL   Nitrite NEGATIVE NEGATIVE   Leukocytes, UA NEGATIVE NEGATIVE  Urine microscopic-add on     Status: Abnormal   Collection Time: 08/08/14 11:50 PM  Result Value Ref Range   Squamous Epithelial / LPF FEW (A) RARE   WBC, UA 0-2 <3 WBC/hpf   RBC / HPF 0-2 <3 RBC/hpf   Bacteria, UA FEW (A) RARE   Urine-Other MUCOUS PRESENT   Pregnancy, urine POC     Status: None   Collection Time: 08/09/14 12:01 AM  Result Value Ref Range   Preg Test, Ur NEGATIVE NEGATIVE  CBC     Status: Abnormal   Collection Time: 08/09/14 12:30 AM  Result Value Ref Range   WBC 8.2 4.0 - 10.5 K/uL   RBC 4.81 3.87 - 5.11 MIL/uL   Hemoglobin 13.4 12.0 - 15.0 g/dL   HCT 40.1 36.0 - 46.0 %   MCV 83.4 78.0 - 100.0 fL   MCH 27.9 26.0 - 34.0 pg   MCHC 33.4 30.0 - 36.0 g/dL   RDW 15.6 (H) 11.5 - 15.5 %   Platelets 238 150 - 400 K/uL  Wet prep, genital     Status: Abnormal   Collection Time: 08/09/14  1:00 AM  Result Value Ref Range   Yeast Wet Prep HPF POC NONE SEEN NONE SEEN   Trich, Wet Prep NONE SEEN NONE SEEN   Clue Cells Wet Prep HPF POC NONE SEEN NONE SEEN   WBC, Wet Prep HPF POC FEW (A) NONE SEEN    MAU Course  Procedures  MDM 800 mg ibuprofen   Assessment and Plan   1. Pelvic pain in female    DC home Rx ibuprofen 800 mg PRN Return to MAU as needed  Follow-up Information    Follow up with [facility].   Specialty:  Obstetrics and Gynecology   Why:  As needed, If symptoms worsen   Contact information:   [place] [phone]       Mathis Bud 08/09/2014, 12:34 AM

## 2014-08-09 NOTE — Discharge Instructions (Signed)
Polycystic Ovarian Syndrome  Polycystic ovarian syndrome (PCOS) is a common hormonal disorder among women of reproductive age. Most women with PCOS grow many small cysts on their ovaries. PCOS can cause problems with your periods and make it difficult to get pregnant. It can also cause an increased risk of miscarriage with pregnancy. If left untreated, PCOS can lead to serious health problems, such as diabetes and heart disease.  CAUSES  The cause of PCOS is not fully understood, but genetics may be a factor.  SIGNS AND SYMPTOMS    Infrequent or no menstrual periods.    Inability to get pregnant (infertility) because of not ovulating.    Increased growth of hair on the face, chest, stomach, back, thumbs, thighs, or toes.    Acne, oily skin, or dandruff.    Pelvic pain.    Weight gain or obesity, usually carrying extra weight around the waist.    Type 2 diabetes.    High cholesterol.    High blood pressure.    Female-pattern baldness or thinning hair.    Patches of thickened and dark brown or black skin on the neck, arms, breasts, or thighs.    Tiny excess flaps of skin (skin tags) in the armpits or neck area.    Excessive snoring and having breathing stop at times while asleep (sleep apnea).    Deepening of the voice.    Gestational diabetes when pregnant.   DIAGNOSIS   There is no single test to diagnose PCOS.    Your health care provider will:    Take a medical history.    Perform a pelvic exam.    Have ultrasonography done.    Check your female and female hormone levels.    Measure glucose or sugar levels in the blood.    Do other blood tests.    If you are producing too many female hormones, your health care provider will make sure it is from PCOS. At the physical exam, your health care provider will want to evaluate the areas of increased hair growth. Try to allow natural hair growth for a few days before the visit.    During a pelvic exam, the ovaries may be  enlarged or swollen because of the increased number of small cysts. This can be seen more easily by using vaginal ultrasonography or screening to examine the ovaries and lining of the uterus (endometrium) for cysts. The uterine lining may become thicker if you have not been having a regular period.   TREATMENT   Because there is no cure for PCOS, it needs to be managed to prevent problems. Treatments are based on your symptoms. Treatment is also based on whether you want to have a baby or whether you need contraception.   Treatment may include:    Progesterone hormone to start a menstrual period.    Birth control pills to make you have regular menstrual periods.    Medicines to make you ovulate, if you want to get pregnant.    Medicines to control your insulin.    Medicine to control your blood pressure.    Medicine and diet to control your high cholesterol and triglycerides in your blood.   Medicine to reduce excessive hair growth.   Surgery, making small holes in the ovary, to decrease the amount of female hormone production. This is done through a long, lighted tube (laparoscope) placed into the pelvis through a tiny incision in the lower abdomen.   HOME CARE INSTRUCTIONS   Only   take over-the-counter or prescription medicine as directed by your health care provider.   Pay attention to the foods you eat and your activity levels. This can help reduce the effects of PCOS.   Keep your weight under control.   Eat foods that are low in carbohydrate and high in fiber.   Exercise regularly.  SEEK MEDICAL CARE IF:   Your symptoms do not get better with medicine.   You have new symptoms.  Document Released: 08/06/2004 Document Revised: 01/31/2013 Document Reviewed: 09/28/2012  ExitCare Patient Information 2015 [employer]. This information is not intended to replace advice given to you by your health care provider. Make sure you discuss any questions you have with your health care provider.

## 2014-08-24 ENCOUNTER — Emergency Department (HOSPITAL_COMMUNITY): Payer: Self-pay

## 2014-08-24 ENCOUNTER — Encounter (HOSPITAL_COMMUNITY): Payer: Self-pay

## 2014-08-24 ENCOUNTER — Emergency Department (HOSPITAL_COMMUNITY)
Admission: EM | Admit: 2014-08-24 | Discharge: 2014-08-25 | Disposition: A | Discharge status: Home / Self Care | Payer: Self-pay | Attending: Emergency Medicine | Admitting: Emergency Medicine

## 2014-08-24 DIAGNOSIS — R079 Chest pain, unspecified: Secondary | ICD-10-CM

## 2014-08-24 DIAGNOSIS — Z87891 Personal history of nicotine dependence: Secondary | ICD-10-CM | POA: Insufficient documentation

## 2014-08-24 DIAGNOSIS — Z792 Long term (current) use of antibiotics: Secondary | ICD-10-CM | POA: Insufficient documentation

## 2014-08-24 DIAGNOSIS — Z8719 Personal history of other diseases of the digestive system: Secondary | ICD-10-CM | POA: Insufficient documentation

## 2014-08-24 DIAGNOSIS — Z8669 Personal history of other diseases of the nervous system and sense organs: Secondary | ICD-10-CM | POA: Insufficient documentation

## 2014-08-24 DIAGNOSIS — R0789 Other chest pain: Secondary | ICD-10-CM | POA: Insufficient documentation

## 2014-08-24 LAB — CBC WITH DIFFERENTIAL/PLATELET
Basophils Absolute: 0 10*3/uL (ref 0.0–0.1)
Basophils Relative: 0 % (ref 0–1)
Eosinophils Absolute: 0.2 10*3/uL (ref 0.0–0.7)
Eosinophils Relative: 3 % (ref 0–5)
HEMATOCRIT: 39.6 % (ref 36.0–46.0)
Hemoglobin: 12.5 g/dL (ref 12.0–15.0)
LYMPHS PCT: 37 % (ref 12–46)
Lymphs Abs: 2.7 10*3/uL (ref 0.7–4.0)
MCH: 26.9 pg (ref 26.0–34.0)
MCHC: 31.6 g/dL (ref 30.0–36.0)
MCV: 85.3 fL (ref 78.0–100.0)
MONO ABS: 0.6 10*3/uL (ref 0.1–1.0)
MONOS PCT: 9 % (ref 3–12)
Neutro Abs: 3.7 10*3/uL (ref 1.7–7.7)
Neutrophils Relative %: 51 % (ref 43–77)
PLATELETS: 265 10*3/uL (ref 150–400)
RBC: 4.64 MIL/uL (ref 3.87–5.11)
RDW: 15.8 % — ABNORMAL HIGH (ref 11.5–15.5)
WBC: 7.2 10*3/uL (ref 4.0–10.5)

## 2014-08-24 LAB — BASIC METABOLIC PANEL
ANION GAP: 3 — AB (ref 5–15)
BUN: 12 mg/dL (ref 6–23)
CHLORIDE: 108 mmol/L (ref 96–112)
CO2: 29 mmol/L (ref 19–32)
Calcium: 8.5 mg/dL (ref 8.4–10.5)
Creatinine, Ser: 0.82 mg/dL (ref 0.50–1.10)
GFR calc non Af Amer: >90 mL/min (ref >90)
Glucose, Bld: 86 mg/dL (ref 70–99)
POTASSIUM: 3.9 mmol/L (ref 3.5–5.1)
Sodium: 140 mmol/L (ref 135–145)

## 2014-08-24 LAB — I-STAT TROPONIN, ED: Troponin i, poc: 0 ng/mL (ref 0.00–0.08)

## 2014-08-24 MED ORDER — KETOROLAC TROMETHAMINE 30 MG/ML IJ SOLN
30.0000 mg | Freq: Once | INTRAMUSCULAR | Status: AC
  Administered 2014-08-24: 30 mg via INTRAVENOUS
  Filled 2014-08-24: qty 1

## 2014-08-24 NOTE — ED Provider Notes (Signed)
CSN: [id number]     Arrival date & time 08/24/14  2102 History   First MD Initiated Contact with Patient 08/24/14 2154     Chief Complaint  Patient presents with  . Chest Pain  . Insect Bite     (Consider location/radiation/quality/duration/timing/severity/associated sxs/prior Treatment) HPI Comments: Patient presents today with a chief complaint of chest pain.  She reports that this pain has been constant since this afternoon.  Pain located across the chest beneath both breasts.  Pain does not radiate.  Pain worse with palpation.  No other exacerbating or alleviating factors.  She denies SOB, fever, chills, numbness, tingling, LE edema/pain, abdominal pain, nausea, and vomiting.  She reports occasional cough over the past few months.  Denies hemoptysis.  She also reports possible insect bite on her mid back.  However, she did not feel or see an insect bite her.  She describes it as a burning pain and states that it also itchs.  She reports that initially she could feel a bump, but this has decreased in size.  She has not applied anything to the area.  She denies prior cardiac history.  Denies history of PE or DVT.  Denies prolonged travel or surgeries in the past 4 weeks.  Denies LE edema or pain.  No exogenous estrogen use.   The history is provided by the patient.    Past Medical History  Diagnosis Date  . Seizure   . Diverticulitis   . PCOS (polycystic ovarian syndrome)   . Morbid obesity    Past Surgical History  Procedure Laterality Date  . Cervical cerclage  03/13/2012    Procedure: CERCLAGE CERVICAL;  Surgeon: Jonnie Kind, MD;  Location: [facility];  Service: Gynecology;  Laterality: N/A;  . Cervical cerclage  03/17/2012    Procedure: CERCLAGE CERVICAL;  Surgeon: Osborne Oman, MD;  Location: [facility];  Service: Gynecology;  Laterality: N/A;  Removal   Family History  Problem Relation Age of Onset  . Other Neg Hx   . Cancer Mother     lung  . Hypertension Mother   .  Diabetes Father    History  Substance Use Topics  . Smoking status: Former Smoker    Types: Cigarettes    Quit date: 02/08/2009  . Smokeless tobacco: Never Used  . Alcohol Use: No   OB History    Gravida Para Term Preterm AB TAB SAB Ectopic Multiple Living   1 1 0 1 0 0 0 0 0 0      Review of Systems  All other systems reviewed and are negative.     Allergies  Review of patient's allergies indicates no known allergies.  Home Medications   Prior to Admission medications   Medication Sig Start Date End Date Taking? Authorizing Provider  fluconazole (DIFLUCAN) 150 MG tablet Take 1 tablet (150 mg total) by mouth once. Patient not taking: Reported on 08/24/2014 06/04/14   Tanna Savoy Stinson, DO  ibuprofen (ADVIL,MOTRIN) 800 MG tablet Take 1 tablet (800 mg total) by mouth every 8 (eight) hours as needed. Patient not taking: Reported on 08/24/2014 08/09/14   Tresea Mall, CNM  metroNIDAZOLE (FLAGYL) 500 MG tablet Take 1 tablet (500 mg total) by mouth 2 (two) times daily. Patient not taking: Reported on 08/24/2014 04/22/14   Tanna Savoy Stinson, DO  metroNIDAZOLE (METROGEL) 0.75 % vaginal gel Place 1 Applicatorful vaginally at bedtime. Apply one applicatorful to vagina at bedtime for 5 days Patient not taking: Reported on 08/24/2014  04/08/14   Lavonia Drafts, MD   BP 140/77 mmHg  Pulse 89  Temp(Src) 98.2 F (36.8 C) (Oral)  Resp 18  SpO2 100% Physical Exam  Constitutional: She appears well-developed and well-nourished.  HENT:  Head: Normocephalic and atraumatic.  Mouth/Throat: Oropharynx is clear and moist.  Neck: Normal range of motion. Neck supple.  Cardiovascular: Normal rate, regular rhythm and normal heart sounds.   Pulmonary/Chest: Effort normal and breath sounds normal. No respiratory distress. She has no wheezes. She has no rales. She exhibits tenderness.  Abdominal: Soft. Bowel sounds are normal. She exhibits no distension and no mass. There is no tenderness. There is  no rebound and no guarding.  Musculoskeletal: Normal range of motion.  No LE edema or erythema  Neurological: She is alert.  Skin: Skin is warm and dry. She is not diaphoretic.  No rash, erythema, or warmth of the back No abscess visualized or palpated  Psychiatric: She has a normal mood and affect.  Nursing note and vitals reviewed.   ED Course  Procedures (including critical care time) Labs Review Labs Reviewed - No data to display  Imaging Review Dg Chest 2 View  08/24/2014   CLINICAL DATA:  Chest pain under the breasts, and between the scapulae. Initial encounter.  EXAM: CHEST  2 VIEW  COMPARISON:  Chest radiograph performed 09/06/2013, and CTA of the chest performed 09/07/2013  FINDINGS: The lungs are well-aerated and clear. There is no evidence of focal opacification, pleural effusion or pneumothorax.  The heart is normal in size; the mediastinal contour is within normal limits. No acute osseous abnormalities are seen.  IMPRESSION: No acute cardiopulmonary process seen. No displaced rib fractures identified.   Electronically Signed   By: Garald Balding M.D.   On: 08/24/2014 23:05     EKG Interpretation   Date/Time:  Saturday August 24 2014 21:31:57 EDT Ventricular Rate:  66 PR Interval:    QRS Duration: 85 QT Interval:  402 QTC Calculation: 421 R Axis:   47 Text Interpretation:  Sinus rhythm No significant change since last  tracing Confirmed by KNAPP  MD-J, JON ([id number]) on 08/24/2014 9:45:06 PM      MDM   Final diagnoses:  None   Patient is a 34 year old female who presents today with two separate complaints.  She is complaining of chest pain and also an insect bite.  Chest pain is atypical.  Chest wall tender to palpation.  No ischemic changes on EKG.  Troponin is negative.  CXR is negative.  She is PERC negative.  Pain improved after given Toradol.  Doubt ACS or PE.  Patient also complaining of a possible insect bite.  No abscess or insect bite visualized or palpated on  exam.  No rash visualized.  Feel that the patient is stable for discharge.  Return precautions given.     Hyman Bible, PA-C 08/25/14 [place], MD 08/26/14 [phone]

## 2014-08-24 NOTE — ED Notes (Signed)
Bed: [facility]1 Expected date:  Expected time:  Means of arrival:  Comments: 

## 2014-08-24 NOTE — ED Notes (Addendum)
Pt presents with c/o possible insect bite on her lower right back. Pt reports she noticed it approx 3 days ago, unsure of when the bite occurred. Pt also c/o chest pain under her breasts and pain between her shoulder blades. Pt reports she has been told in the past that she needed to see a cardiologist and she has not done so.

## 2014-08-25 NOTE — ED Notes (Signed)
Pt alert, oriented, and ambulatory upon DC. She was advised to follow up with [facility].

## 2014-08-25 NOTE — Discharge Instructions (Signed)

## 2014-09-12 ENCOUNTER — Ambulatory Visit (INDEPENDENT_AMBULATORY_CARE_PROVIDER_SITE_OTHER): Payer: Self-pay | Admitting: Obstetrics & Gynecology

## 2014-09-12 VITALS — BP 127/77 | HR 88 | Temp 98.3°F | Ht 63.0 in | Wt 369.6 lb

## 2014-09-12 DIAGNOSIS — N939 Abnormal uterine and vaginal bleeding, unspecified: Secondary | ICD-10-CM

## 2014-09-12 MED ORDER — MEGESTROL ACETATE 20 MG PO TABS: 40.0000 mg | ORAL_TABLET | Freq: Every day | ORAL | Status: DC

## 2014-09-12 NOTE — Progress Notes (Signed)
Patient ID: Diana Blair, female   DOB: [birth year], 34 y.o.   MRN: [id number] Began spotting in February.  Pt has been bleeding but it is not heavy since April. Pt is concerned about diabetes lab work from December.  Pt is a patient of [facility].  Pt is approved with orange card but no appointment.

## 2014-09-12 NOTE — Progress Notes (Signed)
Patient ID: KOBE JANSMA, female   DOB: Feb 12, 1981, 34 y.o.   MRN: [id number] CLINIC ENCOUNTER NOTE  History:  34 y.o. G1P0100 here today for prolonged bleeding of 2 months of daily bleeding.  She reports some heavy bleeding with clots.  Pt denies pain with bleeding.  Pt reports 1 year abnormal with missed cycles every 1-2 months.       Past Medical History  Diagnosis Date  . Seizure   . Diverticulitis   . PCOS (polycystic ovarian syndrome)   . Morbid obesity     Past Surgical History  Procedure Laterality Date  . Cervical cerclage  03/13/2012    Procedure: CERCLAGE CERVICAL;  Surgeon: Jonnie Kind, MD;  Location: [facility];  Service: Gynecology;  Laterality: N/A;  . Cervical cerclage  03/17/2012    Procedure: CERCLAGE CERVICAL;  Surgeon: Osborne Oman, MD;  Location: [facility];  Service: Gynecology;  Laterality: N/A;  Removal    The following portions of the patient's history were reviewed and updated as appropriate: allergies, current medications, past family history, past medical history, past social history, past surgical history and problem list.   Health Maintenance:  Normal pap and negative HRHPV on 2013.    Review of Systems:   Comprehensive review of systems was otherwise negative.  Objective:  Physical Exam BP 127/77 mmHg  Pulse 88  Temp(Src) 98.3 F (36.8 C)  Ht 5\' 3"  (1.6 m)  Wt 369 lb 9.6 oz (167.649 kg)  BMI 65.49 kg/m2 CONSTITUTIONAL: Well-developed, well-nourished female in no acute distress.  HENT:  Normocephalic, atraumatic, External right and left ear normal. Oropharynx is clear and moist EYES: Conjunctivae and EOM are normal. Pupils are equal, round, and reactive to light. No scleral icterus.  NECK: Normal range of motion, supple, no masses SKIN: Skin is warm and dry. No rash noted. Not diaphoretic. No erythema. No pallor. [place]: Alert and oriented to person, place, and time. Normal reflexes, muscle tone coordination. No cranial nerve deficit  noted. PSYCHIATRIC: Normal mood and affect. Normal behavior. Normal judgment and thought content. PELVIC: Deferred   Labs and Imaging Dg Chest 2 View  08/24/2014   CLINICAL DATA:  Chest pain under the breasts, and between the scapulae. Initial encounter.  EXAM: CHEST  2 VIEW  COMPARISON:  Chest radiograph performed 09/06/2013, and CTA of the chest performed 09/07/2013  FINDINGS: The lungs are well-aerated and clear. There is no evidence of focal opacification, pleural effusion or pneumothorax.  The heart is normal in size; the mediastinal contour is within normal limits. No acute osseous abnormalities are seen.  IMPRESSION: No acute cardiopulmonary process seen. No displaced rib fractures identified.   Electronically Signed   By: Garald Balding M.D.   On: 08/24/2014 23:05   02/25/2014 CLINICAL DATA: Menorrhagia, PCOS  EXAM: TRANSABDOMINAL AND TRANSVAGINAL ULTRASOUND OF PELVIS  TECHNIQUE: Both transabdominal and transvaginal ultrasound examinations of the pelvis were performed. Transabdominal technique was performed for global imaging of the pelvis including uterus, ovaries, adnexal regions, and pelvic cul-de-sac. It was necessary to proceed with endovaginal exam following the transabdominal exam to visualize the endometrium.  COMPARISON: 10/13/2012  FINDINGS: Uterus  Measurements: 8.7 x 4.2 x 4.6 cm. No fibroids or other mass visualized.  Endometrium  Thickness: 18 mm. No focal abnormality visualized.  Right ovary  Measurements: 4.2 x 2.4 x 3.0 cm. Small peripheral cysts/follicles.  Left ovary  Measurements: 4.4 x 2.3 x 3.0 cm. Small peripheral cysts/follicles.  Other findings  Small volume pelvic ascites.  IMPRESSION: Endometrial complex measures 18 mm, likely physiologic.  However, if bleeding remains unresponsive to hormonal or medical therapy, focal lesion work-up with sonohysterogram should be considered. Endometrial biopsy should also be  considered in pre-menopausal patients at high risk for endometrial carcinoma.  (Ref: Radiological Reasoning: Algorithmic Workup of Abnormal Vaginal Bleeding with Endovaginal Sonography and Sonohysterography. AJR 2008; 854:O27-03).  Assessment & Plan:  Megace 40 mg q day Pt wil cal if sx do not resolve  inb 2 weeks for increased dosage.  If bleeding improves will use Provera monthly to induce cycles after 3 months Routine preventative health maintenance measures emphasized.   Total face-to-face time with patient: 20 minutes. Over 50% of encounter was spent on counseling and coordination of care.   Areta Terwilliger L. Ihor Dow, MD, [place] Attending [place] for [employer], [place]

## 2014-09-16 ENCOUNTER — Encounter: Payer: Self-pay | Admitting: Obstetrics & Gynecology

## 2014-10-18 ENCOUNTER — Ambulatory Visit: Payer: Self-pay | Admitting: Family Medicine

## 2014-10-31 ENCOUNTER — Encounter: Payer: Self-pay | Admitting: Family Medicine

## 2014-10-31 ENCOUNTER — Ambulatory Visit (INDEPENDENT_AMBULATORY_CARE_PROVIDER_SITE_OTHER): Payer: Self-pay | Admitting: Family Medicine

## 2014-10-31 VITALS — BP 143/75 | HR 88 | Temp 98.9°F | Ht 63.0 in | Wt 358.0 lb

## 2014-10-31 DIAGNOSIS — M25579 Pain in unspecified ankle and joints of unspecified foot: Secondary | ICD-10-CM

## 2014-10-31 DIAGNOSIS — Z0000 Encounter for general adult medical examination without abnormal findings: Secondary | ICD-10-CM

## 2014-10-31 MED ORDER — NAPROXEN 500 MG PO TABS: 500.0000 mg | ORAL_TABLET | Freq: Two times a day (BID) | ORAL | Status: DC | PRN

## 2014-10-31 NOTE — Patient Instructions (Signed)
I've prescribed Naproxen that is twice daily as needed. If you have pain in between these times, you can use Tylenol. Please follow up in 2-3 months to assess your PCOS.

## 2014-10-31 NOTE — Progress Notes (Signed)
Patient ID: Diana Blair, female   DOB: 10/13/1980, 34 y.o.   MRN: [id number]    CC: establish care HPI: Patient is a 34 y.o. female with a past medical history of morbid obesity presenting to clinic today to establish concerns.  Acute Concerns: PCOS: U/S: Endometrial complex measures 18 mm, likely physiologic. However, if bleeding remains unresponsive to hormonal or medical therapy, focal lesion work-up with sonohysterogram should be considered. Endometrial biopsy should also be considered in pre-menopausal patients at high risk for endometrial carcinoma. Was seen by gyn: was supposed to start Megase 40mg  q daily x 3 months however she didn't do this due to concerns for cancer.  LMP: 10/15/14, bleed x 7 days. Her cycle has been regular since May without issues. Would like to hold off on any pharmacologic txs if possible.   Sexual/Birth History: G1P01000  Birth Control: Abstinence   Past Medical History Patient Active Problem List   Diagnosis Date Noted  . Health care maintenance 11/01/2014  . PCOS (polycystic ovarian syndrome) 02/08/2013  . Preterm delivery 04/03/2012  . Morbid obesity 02/23/2012  . Elevated BP 02/23/2012  Diverticulosis  Medications- reviewed and updated No medications (prescribed or OTC)  Cancer Screening:  Pap Smear: 02/23/12- negative for intraepithelial lesion or malignancy, Neg HR HPV  Mammogram:  N/A  Colonoscopy: N/A  Dexa: N/A   Social:  History   Social History  . Marital Status: Legally Separated    Spouse Name: N/A  . Number of Children: N/A  . Years of Education: N/A   Social History Main Topics  . Smoking status: Former Smoker    Types: Cigarettes    Quit date: 02/08/2009  . Smokeless tobacco: Never Used  . Alcohol Use: No  . Drug Use: No  . Sexual Activity: Yes    Birth Control/ Protection: None   Other Topics Concern  . 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  Alcoholic husband lives with her, they  are separated. She doesn't want him on the streets because he gets in fights. She gets sporadic sleep due to him.   Family History  Problem Relation Age of Onset  . Other Neg Hx   . Cancer Mother     lung  . Hypertension Mother   . Diabetes Father   . Diabetes Paternal Grandmother   . Heart failure Father   . Hypertension Sister   . Hypertension Brother   . Rashes / Skin problems Sister     ROS: All other systems reviewed and are negative except: fatigue, generalized pain  Office vital signs reviewed. BP 143/75 mmHg  Pulse 88  Temp(Src) 98.9 F (37.2 C) (Oral)  Ht 5\' 3"  (1.6 m)  Wt 358 lb (162.388 kg)  BMI 63.43 kg/m2  LMP 10/24/2014   Physical Examination:  General: Awake, alert, well- nourished, NAD. Coarse hair over chin.  ENMT:  TMs intact, normal light reflex, no erythema, no bulging. Nasal turbinates moist. MMM, Oropharynx clear without erythema or tonsillar exudate/hypertrophy Eyes: Conjunctiva non-injected. PERRL.  Cardio: RRR, no m/r/g noted. Trace pitting edema. 1+ pitting edema b/l. Pulm: No increased WOB.  CTAB, without wheezes, rhonchi or crackles noted.  GI: Obese, soft, NT/ND,+BS x4.  GU: deffered MSK: Normal gait and station. No gross deformities. No TPP over the knees. No erythema or effusions noted.  Skin: dry, intact, no rashes or lesions Neuro: Strength and sensation grossly intact in the UE and LE b/l, DTRs 2/4  Assessment/Plan: Health care  maintenance Patient is up to date on pap smear. Due for Tdap however deferred this today. Patient no interested in contraception currently as she's not sexually active Given risk factors, would consider lipid screening in the future.  Patient to f/u in 2 months for chronic knee pain and fatigue Discussed the importance of weight loss, for general health and knee pain: diet and physical activity discussed Discussed safety in the home, pt feels safe currently.     No orders of the defined types were placed in  this encounter.    Meds ordered this encounter  Medications  . naproxen (NAPROSYN) 500 MG tablet    Sig: Take 1 tablet (500 mg total) by mouth 2 (two) times daily as needed for moderate pain.    Dispense:  30 tablet    Refill:  [place] PGY-2, [place]

## 2014-11-01 ENCOUNTER — Encounter: Payer: Self-pay | Admitting: Family Medicine

## 2014-11-01 DIAGNOSIS — Z0000 Encounter for general adult medical examination without abnormal findings: Secondary | ICD-10-CM | POA: Insufficient documentation

## 2014-11-01 NOTE — Assessment & Plan Note (Addendum)
Patient is up to date on pap smear. Due for Tdap however deferred this today. Patient no interested in contraception currently as she's not sexually active Given risk factors, would consider lipid screening in the future.  Patient to f/u in 2 months for chronic knee pain and fatigue Discussed the importance of weight loss, for general health and knee pain: diet and physical activity discussed Discussed safety in the home, pt feels safe currently.

## 2015-08-11 ENCOUNTER — Telehealth: Payer: Self-pay | Admitting: Obstetrics and Gynecology

## 2015-08-11 DIAGNOSIS — B379 Candidiasis, unspecified: Secondary | ICD-10-CM

## 2015-08-11 MED ORDER — FLUCONAZOLE 150 MG PO TABS: 150.0000 mg | ORAL_TABLET | Freq: Once | ORAL | Status: DC

## 2015-08-11 NOTE — Telephone Encounter (Signed)
Called patient and she states she is having vaginal itching and discharge and would like something sent in. Told patient we can get an Rx sent in for her. Patient verbalized understanding and states she was told we cannot do her pap smear and we have been her ob/gyn for 10 years. Asked patient if she has insurance and she said no. Told patient that we offer pap free screenings after hours at different locations in the area and its our providers that do them. Told patient the advantage of that for her is that it is free and if her pap were to be abnormal they can get her in touch with a program [facility] offers to cover the cost of any additional testing. Told patient we can certainly do a pap smear at her first but she would have to pay the full price of the pap where our after hours screening is free. Patient verbalized understanding & states she has that number already. Confirmed appts with patient. Patient states she will have to check her work schedule about those dates. Patient's phone call was then disconnected

## 2015-08-11 NOTE — Telephone Encounter (Signed)
Questions about getting a RX

## 2015-08-18 ENCOUNTER — Telehealth: Payer: Self-pay

## 2015-08-18 NOTE — Telephone Encounter (Signed)
Pt called in regards to a medication that was prescribed.  Called pt and LM to return our call back.

## 2015-08-20 ENCOUNTER — Ambulatory Visit (INDEPENDENT_AMBULATORY_CARE_PROVIDER_SITE_OTHER): Payer: Self-pay | Admitting: Family

## 2015-08-20 ENCOUNTER — Encounter: Payer: Self-pay | Admitting: Family

## 2015-08-20 VITALS — BP 140/68 | HR 65 | Temp 98.5°F | Ht 63.0 in | Wt 340.1 lb

## 2015-08-20 DIAGNOSIS — O09899 Supervision of other high risk pregnancies, unspecified trimester: Secondary | ICD-10-CM | POA: Insufficient documentation

## 2015-08-20 DIAGNOSIS — Z124 Encounter for screening for malignant neoplasm of cervix: Secondary | ICD-10-CM

## 2015-08-20 DIAGNOSIS — O09299 Supervision of pregnancy with other poor reproductive or obstetric history, unspecified trimester: Secondary | ICD-10-CM

## 2015-08-20 DIAGNOSIS — O09219 Supervision of pregnancy with history of pre-term labor, unspecified trimester: Secondary | ICD-10-CM

## 2015-08-20 DIAGNOSIS — O0990 Supervision of high risk pregnancy, unspecified, unspecified trimester: Secondary | ICD-10-CM

## 2015-08-20 DIAGNOSIS — Z01419 Encounter for gynecological examination (general) (routine) without abnormal findings: Secondary | ICD-10-CM

## 2015-08-20 DIAGNOSIS — Z8751 Personal history of pre-term labor: Secondary | ICD-10-CM | POA: Insufficient documentation

## 2015-08-20 DIAGNOSIS — Z9889 Other specified postprocedural states: Secondary | ICD-10-CM

## 2015-08-20 DIAGNOSIS — Z3202 Encounter for pregnancy test, result negative: Secondary | ICD-10-CM

## 2015-08-20 DIAGNOSIS — Z1151 Encounter for screening for human papillomavirus (HPV): Secondary | ICD-10-CM

## 2015-08-20 LAB — POCT PREGNANCY, URINE: PREG TEST UR: NEGATIVE

## 2015-08-20 NOTE — Progress Notes (Signed)
  Subjective:     Diana Blair is a G78P0100  35 y.o. female here for a routine exam.  Current complaints: vaginal itching that increased after using monistat.  Frequent vaginal infections, including yeast and BV.  Hx of 21 wk loss.  Desires a child.  Considering restarting metformin.  Desires to clear BV first.  Currently loss 40 lbs.     Gynecologic History Patient's last menstrual period was 08/06/2015. Contraception: none Last Pap: 2014. Results were: normal Last mammogram: n/a   Obstetric History OB History  Gravida Para Term Preterm AB SAB TAB Ectopic Multiple Living  1 1 0 1 0 0 0 0 0 0     # Outcome Date GA Lbr Len/2nd Weight Sex Delivery Anes PTL Lv  1 Preterm 03/17/12 [redacted]w[redacted]d 00:41 / 00:07 11.6 oz (0.329 kg) M Vag-Spont Spinal,Other  Y       The following portions of the patient's history were reviewed and updated as appropriate: allergies, current medications, past family history, past medical history, past social history, past surgical history and problem list.  Review of Systems Pertinent items are noted in HPI.    Objective:     BP 140/68 mmHg  Pulse 65  Temp(Src) 98.5 F (36.9 C)  Ht 5\' 3"  (1.6 m)  Wt 340 lb 1.6 oz (154.268 kg)  BMI 60.26 kg/m2  LMP 08/06/2015 General appearance: alert, cooperative and appears stated age Head: Normocephalic, without obvious abnormality, atraumatic Neck: no adenopathy, no carotid bruit, no JVD, supple, symmetrical, trachea midline and thyroid not enlarged, symmetric, no tenderness/mass/nodules Lungs: clear to auscultation bilaterally Breasts: normal appearance, no masses or tenderness, No nipple retraction or dimpling, No nipple discharge or bleeding, No axillary or supraclavicular adenopathy, Normal to palpation without dominant masses, Taught monthly breast self examination Heart: regular rate and rhythm, S1, S2 normal, no murmur, click, rub or gallop Abdomen: soft, non-tender; bowel sounds normal; no masses,  no  organomegaly Pelvic: cervix normal in appearance, external genitalia normal, no adnexal masses or tenderness, no cervical motion tenderness, rectovaginal septum normal, uterus normal size, shape, and consistency and vagina normal without discharge Skin: Skin color, texture, turgor normal. No rashes or lesions    Assessment:     PCOS Vaginal Disharge  Plan:  Pap smear collected  wet prep    RX [facility]af[facility] Plan to call patient with results  Gwen Pounds, CNM

## 2015-08-20 NOTE — Progress Notes (Signed)
States had yeast infection.Took monistat and it made it worse- itching got worse. Took diflucan and it helped itching a lot. States now having white discharge more than before, but less itching.   States had not missed a cycle but usually last 5 days, last one only 3 days.

## 2015-08-21 LAB — WET PREP, GENITAL
CLUE CELLS WET PREP: NONE SEEN
TRICH WET PREP: NONE SEEN
YEAST WET PREP: NONE SEEN

## 2015-08-21 LAB — CYTOLOGY - PAP

## 2015-08-27 ENCOUNTER — Ambulatory Visit: Payer: Self-pay | Admitting: Obstetrics & Gynecology

## 2015-08-27 NOTE — Telephone Encounter (Signed)
Pt has been seen since this message 

## 2015-11-08 ENCOUNTER — Encounter (HOSPITAL_COMMUNITY): Payer: Self-pay | Admitting: Emergency Medicine

## 2015-11-08 ENCOUNTER — Emergency Department (HOSPITAL_COMMUNITY)
Admission: EM | Admit: 2015-11-08 | Discharge: 2015-11-08 | Disposition: A | Discharge status: Home / Self Care | Payer: Self-pay | Attending: Emergency Medicine | Admitting: Emergency Medicine

## 2015-11-08 ENCOUNTER — Emergency Department (HOSPITAL_COMMUNITY): Payer: Self-pay

## 2015-11-08 DIAGNOSIS — Z87891 Personal history of nicotine dependence: Secondary | ICD-10-CM | POA: Insufficient documentation

## 2015-11-08 DIAGNOSIS — R002 Palpitations: Secondary | ICD-10-CM | POA: Insufficient documentation

## 2015-11-08 DIAGNOSIS — Z8669 Personal history of other diseases of the nervous system and sense organs: Secondary | ICD-10-CM | POA: Insufficient documentation

## 2015-11-08 LAB — BASIC METABOLIC PANEL
ANION GAP: 9 (ref 5–15)
BUN: 10 mg/dL (ref 6–20)
CALCIUM: 8.9 mg/dL (ref 8.9–10.3)
CO2: 21 mmol/L — ABNORMAL LOW (ref 22–32)
Chloride: 108 mmol/L (ref 101–111)
Creatinine, Ser: 0.67 mg/dL (ref 0.44–1.00)
Glucose, Bld: 144 mg/dL — ABNORMAL HIGH (ref 65–99)
Potassium: 3.6 mmol/L (ref 3.5–5.1)
Sodium: 138 mmol/L (ref 135–145)

## 2015-11-08 LAB — CBC
HCT: 35.5 % — ABNORMAL LOW (ref 36.0–46.0)
HEMOGLOBIN: 11.3 g/dL — AB (ref 12.0–15.0)
MCH: 25.4 pg — AB (ref 26.0–34.0)
MCHC: 31.8 g/dL (ref 30.0–36.0)
MCV: 79.8 fL (ref 78.0–100.0)
PLATELETS: 256 10*3/uL (ref 150–400)
RBC: 4.45 MIL/uL (ref 3.87–5.11)
RDW: 15.8 % — AB (ref 11.5–15.5)
WBC: 8 10*3/uL (ref 4.0–10.5)

## 2015-11-08 LAB — I-STAT TROPONIN, ED: TROPONIN I, POC: 0 ng/mL (ref 0.00–0.08)

## 2015-11-08 LAB — I-STAT BETA HCG BLOOD, ED (MC, WL, AP ONLY): I-stat hCG, quantitative: <5 m[IU]/mL (ref <5)

## 2015-11-08 NOTE — ED Notes (Addendum)
Pt c/o intermittent chest palpitations and SOB w/ exertion.  Sts she has been under "alot of stress."  Pt reports that she was worked up for a PE "last time."  Sts D Dimer was elevated, but CT Angio was negative.  Pt never followed up w/ referral.

## 2015-11-08 NOTE — Discharge Instructions (Signed)
Please follow-up with cardiology first thing Monday for further evaluation and management. Please return immediately experience any new or worsening signs or symptoms. Palpitations A palpitation is the feeling that your heartbeat is irregular or is faster than normal. It may feel like your heart is fluttering or skipping a beat. Palpitations are usually not a serious problem. However, in some cases, you may need further medical evaluation. CAUSES  Palpitations can be caused by:  Smoking.  Caffeine or other stimulants, such as diet pills or energy drinks.  Alcohol.  Stress and anxiety.  Strenuous physical activity.  Fatigue.  Certain medicines.  Heart disease, especially if you have a history of irregular heart rhythms (arrhythmias), such as atrial fibrillation, atrial flutter, or supraventricular tachycardia.  An improperly working pacemaker or defibrillator. DIAGNOSIS  To find the cause of your palpitations, your health care provider will take your medical history and perform a physical exam. Your health care provider may also have you take a test called an ambulatory electrocardiogram (ECG). An ECG records your heartbeat patterns over a 24-hour period. You may also have other tests, such as:  Transthoracic echocardiogram (TTE). During echocardiography, sound waves are used to evaluate how blood flows through your heart.  Transesophageal echocardiogram (TEE).  Cardiac monitoring. This allows your health care provider to monitor your heart rate and rhythm in real time.  Holter monitor. This is a portable device that records your heartbeat and can help diagnose heart arrhythmias. It allows your health care provider to track your heart activity for several days, if needed.  Stress tests by exercise or by giving medicine that makes the heart beat faster. TREATMENT  Treatment of palpitations depends on the cause of your symptoms and can vary greatly. Most cases of palpitations do not  require any treatment other than time, relaxation, and monitoring your symptoms. Other causes, such as atrial fibrillation, atrial flutter, or supraventricular tachycardia, usually require further treatment. HOME CARE INSTRUCTIONS   Avoid:  Caffeinated coffee, tea, soft drinks, diet pills, and energy drinks.  Chocolate.  Alcohol.  Stop smoking if you smoke.  Reduce your stress and anxiety. Things that can help you relax include:  A method of controlling things in your body, such as your heartbeats, with your mind (biofeedback).  Yoga.  Meditation.  Physical activity such as swimming, jogging, or walking.  Get plenty of rest and sleep. SEEK MEDICAL CARE IF:   You continue to have a fast or irregular heartbeat beyond 24 hours.  Your palpitations occur more often. SEEK IMMEDIATE MEDICAL CARE IF:  You have chest pain or shortness of breath.  You have a severe headache.  You feel dizzy or you faint. MAKE SURE YOU:  Understand these instructions.  Will watch your condition.  Will get help right away if you are not doing well or get worse.   This information is not intended to replace advice given to you by your health care provider. Make sure you discuss any questions you have with your health care provider.   Document Released: 04/09/2000 Document Revised: 04/17/2013 Document Reviewed: 06/11/2011 Elsevier Interactive Patient Education [employer].

## 2015-11-08 NOTE — ED Notes (Signed)
PA at bedside.

## 2015-11-08 NOTE — ED Provider Notes (Signed)
CSN: [id number]     Arrival date & time 11/08/15  1108 History   First MD Initiated Contact with Patient 11/08/15 1115     Chief Complaint  Patient presents with  . Palpitations    HPI   35 year old female presents today with complaints of abnormal sensation in her chest. Patient reports that over the last several days she's had intermittent racing of her heart, with the sensation of her heart beating in her chest. She reports yesterday she had no episodes last approximately 10 minutes, but has had intermittent episodes with several beats since then. Patient reports yesterday she was feeling hot and overworked, denies initial palpitations at that time. Patient denies any presyncopal signs or symptoms, chest pain, lotion swelling or edema. Patient denies any over-the-counter medications, denies any significant caffeine or drug use. No history of cardiac disease, reports intermittent smoking history.  Past Medical History  Diagnosis Date  . Seizure ([place])   . Diverticulitis   . PCOS (polycystic ovarian syndrome)   . Morbid obesity [facility])    Past Surgical History  Procedure Laterality Date  . Cervical cerclage  03/13/2012    Procedure: CERCLAGE CERVICAL;  Surgeon: Jonnie Kind, MD;  Location: [facility];  Service: Gynecology;  Laterality: N/A;  . Cervical cerclage  03/17/2012    Procedure: CERCLAGE CERVICAL;  Surgeon: Osborne Oman, MD;  Location: [facility];  Service: Gynecology;  Laterality: N/A;  Removal   Family History  Problem Relation Age of Onset  . Other Neg Hx   . Cancer Mother     lung  . Hypertension Mother   . Diabetes Father   . Diabetes Paternal Grandmother   . Heart failure Father   . Hypertension Sister   . Hypertension Brother   . Rashes / Skin problems Sister    Social History  Substance Use Topics  . Smoking status: Former Smoker    Types: Cigarettes    Quit date: 02/08/2009  . Smokeless tobacco: Never Used  . Alcohol Use: No   OB History    Gravida Para  Term Preterm AB TAB SAB Ectopic Multiple Living   1 1 0 1 0 0 0 0 0 0      Review of Systems  All other systems reviewed and are negative.   Allergies  Review of patient's allergies indicates no known allergies.  Home Medications   Prior to Admission medications   Not on File   BP 125/76 mmHg  Pulse 93  Temp(Src) 98.3 F (36.8 C) (Oral)  Resp 18  SpO2 100%  LMP 10/24/2015   Physical Exam  Constitutional: She is oriented to person, place, and time. She appears well-developed and well-nourished.  Morbidly obese  HENT:  Head: Normocephalic and atraumatic.  Eyes: Conjunctivae are normal. Pupils are equal, round, and reactive to light. Right eye exhibits no discharge. Left eye exhibits no discharge. No scleral icterus.  Neck: Normal range of motion. No JVD present. No tracheal deviation present.  Cardiovascular: Normal rate and regular rhythm.  Exam reveals no gallop and no friction rub.   No murmur heard. Split s2  Pulmonary/Chest: Effort normal and breath sounds normal. No stridor. No respiratory distress. She has no wheezes. She has no rales. She exhibits no tenderness.  Abdominal: Soft. She exhibits no distension.  Musculoskeletal: Normal range of motion. She exhibits no edema or tenderness.  Neurological: She is alert and oriented to person, place, and time. Coordination normal.  Skin: Skin is warm and dry. No rash  noted. No erythema. No pallor.  Psychiatric: She has a normal mood and affect. Her behavior is normal. Judgment and thought content normal.  Nursing note and vitals reviewed.   ED Course  Procedures (including critical care time) Labs Review Labs Reviewed  BASIC METABOLIC PANEL - Abnormal; Notable for the following:    CO2 21 (*)    Glucose, Bld 144 (*)    All other components within normal limits  CBC - Abnormal; Notable for the following:    Hemoglobin 11.3 (*)    HCT 35.5 (*)    MCH 25.4 (*)    RDW 15.8 (*)    All other components within normal  limits  I-STAT TROPOININ, ED  I-STAT BETA HCG BLOOD, ED ([facility], [facility], [facility] ONLY)    Imaging Review Dg Chest 2 View  11/08/2015  CLINICAL DATA:  Heart palpitations, history of smoking EXAM: CHEST  2 VIEW COMPARISON:  08/24/2014 FINDINGS: The heart size and mediastinal contours are within normal limits. Both lungs are clear. The visualized skeletal structures are unremarkable. IMPRESSION: No active cardiopulmonary disease. Electronically Signed   By: Jerilynn Mages.  Shick M.D.   On: 11/08/2015 12:19   I have personally reviewed and evaluated these images and lab results as part of my medical decision-making.   EKG Interpretation None      ED ECG REPORT   Date: 11/09/2015  Rate: 81  Rhythm: normal sinus rhythm  QRS Axis: normal  Intervals: normal  ST/T Wave abnormalities: normal  Conduction Disutrbances:none  Narrative Interpretation:   Old EKG Reviewed: unchanged  I have personally reviewed the EKG tracing and agree with the computerized printout as noted. MDM   Final diagnoses:  Palpitations    Labs: I-STAT troponin, I think that beta-hCG, BMP, CBC  Imaging: DG chest 2 view  Consults:  Therapeutics:  Discharge Meds:   Assessment/Plan: 35 year old female presents today with palpitations, a symptomatic while here in the ED, normal workup here with no significant findings. Patient's symptoms likely related to PVCs, low suspicion for tachyarrhythmia. Patient will be instructed to follow-up with cardiology closely for further evaluation and management. Patient given strict return cautioned, she verbalized understanding and agreement today's plan had no further questions or concerns at the time of discharge        Okey Regal, PA-C 11/09/15 [place], MD 11/17/15 [phone]

## 2015-11-08 NOTE — ED Notes (Signed)
Patient transported to X-ray 

## 2015-11-08 NOTE — ED Provider Notes (Signed)
Patient seen and evaluated. Discussed and seen with Diana Sink PA-C. Patient describes intermittent episodes of irregular heartbeat. Frequent user of feeling of a single skipped beat in her chest. Has a normal EKG and evaluation here. Does not scribe tachycardia palpitations. No stimulus no cocaine or amphetamines. No over-the-counter cough or cold medicines. No excessive caffeine. She does smoke. Patient is appropriate for outpatient follow-up with cardiology for heart monitoring.  Exam shows clear lungs. Regular rhythm. No murmurs or abnormal heart tones. No thyromegaly. Not febrile.  Diana Furry, MD 11/08/15 1407

## 2015-11-08 NOTE — ED Notes (Signed)
Pt concerned regarding multiple symptoms that she feels have not been addressed. She was made aware of test completed and MD feels she is able to go home and follow up with PMD. Pt states she will go to urgent care if she needs to.

## 2015-11-08 NOTE — ED Notes (Signed)
Pt complaint of intermittent fluttering/palpitations onset 2000 yesterday while at working; pt denies pain or other symptoms. Pt reports may have been overheated.

## 2015-11-20 ENCOUNTER — Ambulatory Visit (INDEPENDENT_AMBULATORY_CARE_PROVIDER_SITE_OTHER): Payer: Self-pay | Admitting: Cardiology

## 2015-11-20 ENCOUNTER — Encounter: Payer: Self-pay | Admitting: Cardiology

## 2015-11-20 VITALS — BP 102/60 | HR 84 | Ht 63.0 in | Wt 332.0 lb

## 2015-11-20 DIAGNOSIS — R002 Palpitations: Secondary | ICD-10-CM

## 2015-11-20 DIAGNOSIS — R42 Dizziness and giddiness: Secondary | ICD-10-CM

## 2015-11-20 DIAGNOSIS — R0609 Other forms of dyspnea: Secondary | ICD-10-CM

## 2015-11-20 NOTE — Patient Instructions (Signed)
Your physician has recommended that you wear an 30 day event monitor. Event monitors are medical devices that record the heart's electrical activity. Doctors most often [place] these monitors to diagnose arrhythmias. Arrhythmias are problems with the speed or rhythm of the heartbeat. The monitor is a small, portable device. You can wear one while you do your normal daily activities. This is usually used to diagnose what is causing palpitations/syncope (passing out).this will be placed on at the [facility].  Your physician recommends that you schedule a follow-up appointment in: 6 weeks.

## 2015-11-20 NOTE — Progress Notes (Signed)
PCP: Kathrine Cords, MD  Clinic Note: Chief Complaint  Patient presents with  . Follow-up    New patient.  . Dizziness  . Shortness of Breath  . Tachycardia  . Palpitations  . Edema    HPI: Diana Blair is a 35 y.o. female with a PMH below who presents today for Initial cardiology evaluation for dizziness and palpitations. This is post ER visit follow-up. She presented to the ER on July 15 with complaints of irregular heartbeat.  We has no prior cardiac history besides PCOS and morbid obesity.  Recent Hospitalizations: [facility] was seen in emergency room on July 17.  Studies Reviewed: None  Interval History: Kashauna presents here today with concerns of increasing frequency of palpitations. She has been having these episodes of her heart racing from time to time. Lasts maybe a minute or 2, and she usually is able to control it by getting herself out of whatever situation season and taking a deep breath in. She usually also drinks something and that usually makes it get better. Associated with chest tightness or pressure. She does feel little bit clammy and dizzy on occasion, but the reason why she went to the hospitals of this was the first time it actually made her feel like she may pass out. She felt clammy and diaphoretic and very nauseated that time. It made her feel like the sensation she had when she was having a PICC line placed years ago and the catheter to go over ventricle. These spells are usually have at least 2-3 times in the course of 25. Unless she is simply not doing anything She mostly notes these symptoms occurring when she is on the go or doing things or at work being stressed. She is currently working at [employer] to [employer], and is always on the go. She is home and at rest she feels better after the first hour or so. She denies drinking any excessive amount of coffee or caffeinated beverages. She is actually been working hard to lose weight and has lost  quite a bit in the last year. Otherwise from a cardiac standpoint she really has not had true near syncopal or syncopal symptoms with the exception of this one time in the hospital. She denies any resting or exertional chest tightness or pressure, but does note having some exertional dyspnea. That is also improved with her weight loss.   No PND, orthopnea -- she does have some end of the day swelling in her ankles..  No TIA/amaurosis fugax symptoms. No claudication.  ROS: A comprehensive was performed. Review of Systems  Constitutional: Positive for weight loss (Interventional).  HENT: Negative for nosebleeds.   Eyes: Negative for blurred vision.  Respiratory: Negative for cough, shortness of breath and wheezing.   Cardiovascular: Positive for palpitations. Negative for chest pain.  Gastrointestinal: Positive for abdominal pain (If she eats the wrong foods.). Negative for blood in stool, heartburn and melena.  Genitourinary: Negative for hematuria.  Musculoskeletal: Negative for joint pain and myalgias.  Neurological: Positive for dizziness (When she had the palpitation spells.). Negative for headaches.  Psychiatric/Behavioral: The patient is not nervous/anxious and does not have insomnia.     Past Medical History:  Diagnosis Date  . Diverticulitis   . Morbid obesity with BMI of 50.0-59.9, adult (HCC)    BMI 58.8  . PCOS (polycystic ovarian syndrome)   . Seizure [facility])     Past Surgical History:  Procedure Laterality Date  . CERVICAL CERCLAGE  03/13/2012   Procedure: CERCLAGE CERVICAL;  Surgeon: Jonnie Kind, MD;  Location: [facility];  Service: Gynecology;  Laterality: N/A;  . CERVICAL CERCLAGE  03/17/2012   Procedure: CERCLAGE CERVICAL;  Surgeon: Osborne Oman, MD;  Location: [facility];  Service: Gynecology;  Laterality: N/A;  Removal   Prior to Admission medications   Not on File   No Known Allergies   Social History   Social History  . Marital status: Legally Separated     Spouse name: N/A  . Number of children: N/A  . Years of education: N/A   Social History Main Topics  . Smoking status: Former Smoker    Types: Cigarettes    Quit date: 02/08/2009  . Smokeless tobacco: Never Used  . Alcohol use No  . Drug use: No  . Sexual activity: Yes    Birth control/ protection: None   Other Topics Concern  . None   Social History Narrative  . None   Family History  Problem Relation Age of Onset  . Cancer Mother     lung  . Hypertension Mother   . Diabetes Father   . Heart failure Father   . Diabetes Paternal Grandmother   . Hypertension Sister   . Hypertension Brother   . Rashes / Skin problems Sister   . Other Neg Hx     Wt Readings from Last 3 Encounters:  11/20/15 (!) 332 lb (150.6 kg)  08/20/15 (!) 340 lb 1.6 oz (154.3 kg)  10/31/14 (!) 358 lb (162.4 kg)    PHYSICAL EXAM BP 102/60   Pulse 84   Ht 5\' 3"  (1.6 m)   Wt (!) 332 lb (150.6 kg)   LMP 10/24/2015   BMI 58.81 kg/m  General appearance: alert, cooperative, appears stated age, no distress and Super morbidly obese. She is otherwise well-groomed HEENT: [place]/AT, EOMI, MMM, anicteric sclera Neck: no adenopathy, no carotid bruit and no JVD Lungs: clear to auscultation bilaterally, normal percussion bilaterally and non-labored Heart: regular rate and rhythm; distant S1 and S2 ,  no murmur, click, rub or gallop.  Unable to palpate PMI Abdomen: soft, non-tender; bowel sounds normal; no masses,  no organomegaly - unable to palpate Extremities: extremities normal, atraumatic, no cyanosis, and edema trace Pulses: 2+ and symmetric;  Skin: mobility and turgor normal, no edema, no evidence of bleeding or bruising, no lesions noted, temperature normal and texture normal  Neurologic: Mental status: Alert, oriented, thought content appropriate Cranial nerves: normal (II-XII grossly intact)    Adult ECG Report - Not checked   Other studies Reviewed: Additional studies/ records that were  reviewed today include:  Recent Labs:     Chemistry      Component Value Date/Time   NA 138 11/08/2015 1137   K 3.6 11/08/2015 1137   CL 108 11/08/2015 1137   CO2 21 (L) 11/08/2015 1137   BUN 10 11/08/2015 1137   CREATININE 0.67 11/08/2015 1137      Component Value Date/Time   CALCIUM 8.9 11/08/2015 1137   ALKPHOS 70 03/08/2012 1203   AST 12 03/08/2012 1203   ALT 19 03/08/2012 1203   BILITOT 0.2 (L) 03/08/2012 1203      ASSESSMENT / PLAN: Problem List Items Addressed This Visit    Heart palpitations - Primary    Probably the symptoms she feeling the most likely PVCs or PACs, however she does say that the spells seem to be prolonged, and are similar to what she felt when she had  a central line placed that the older ventricle. With that in mind, I think it's reasonable to investigate with a cardiac event monitor in order to determine if there is any true arrhythmias. Based on whether or not we find an arrhythmia, we noted the best way to treat. Otherwise he can simply reassure.  She was quite symptomatic with this most recent episode and amazingly have been that she was somewhat dehydrated in the heat with not adequate been drinking. Recommend that she ensures that she drinks enough at least 04/03/2011 ounce cups a day. Avoid caffeine.  If there is significantly concerning arrhythmias, we would then want to check an echocardiogram.  The fact that her blood pressure is relatively low would make. Treatment with beta blocker less of a valid option      Relevant Orders   Cardiac event monitor   Dyspnea on exertion    Probably related to obesity and deconditioning. Now she feels after the event monitor, we can consider checking an echocardiogram to assess her EF. Does not sound like classic heart failure.      Dizziness    Only associated with palpitations. This is a good sign that we hopefully will capture something on her monitor. However she does have blood pressures in the low  100 mmHg range systolic which would mean treating palpitations with a beta blocker would not be favorable.      Relevant Orders   Cardiac event monitor    Other Visit Diagnoses   None.     Current medicines are reviewed at length with the patient today. (+/- concerns) None The following changes have been made: None Studies Ordered:   Orders Placed This Encounter  Procedures  . Cardiac event monitor   ROV after event monitor - ~6 weeks.      Glenetta Hew, M.D., M.S. Interventional Cardiologist   Pager # [phone] Phone # [phone] [address] [place], [place] [address]

## 2015-11-22 ENCOUNTER — Encounter: Payer: Self-pay | Admitting: Cardiology

## 2015-11-22 DIAGNOSIS — R42 Dizziness and giddiness: Secondary | ICD-10-CM | POA: Insufficient documentation

## 2015-11-22 NOTE — Assessment & Plan Note (Signed)
Probably related to obesity and deconditioning. Now she feels after the event monitor, we can consider checking an echocardiogram to assess her EF. Does not sound like classic heart failure.

## 2015-11-22 NOTE — Assessment & Plan Note (Signed)
Only associated with palpitations. This is a good sign that we hopefully will capture something on her monitor. However she does have blood pressures in the low 100 mmHg range systolic which would mean treating palpitations with a beta blocker would not be favorable.

## 2015-11-22 NOTE — Assessment & Plan Note (Addendum)
Probably the symptoms she feeling the most likely PVCs or PACs, however she does say that the spells seem to be prolonged, and are similar to what she felt when she had a central line placed that the older ventricle. With that in mind, I think it's reasonable to investigate with a cardiac event monitor in order to determine if there is any true arrhythmias. Based on whether or not we find an arrhythmia, we noted the best way to treat. Otherwise he can simply reassure.  She was quite symptomatic with this most recent episode and amazingly have been that she was somewhat dehydrated in the heat with not adequate been drinking. Recommend that she ensures that she drinks enough at least 04/03/2011 ounce cups a day. Avoid caffeine.  If there is significantly concerning arrhythmias, we would then want to check an echocardiogram.  The fact that her blood pressure is relatively low would make. Treatment with beta blocker less of a valid option

## 2015-11-25 ENCOUNTER — Telehealth: Payer: Self-pay | Admitting: *Deleted

## 2015-11-25 NOTE — Telephone Encounter (Signed)
Patient instructed Cardiac event monitors are ordered through a private monitor company which are not covered under the [facility] discount program.  We will cancel her appointment today.  She can come in at her convenience and speak to Darcey Nora at the check out desk.  She will supply her with the application for the hardship program with the [employer] cardiac event monitor.  Once the paperwork has been filled out and returned to our office it usually takes up to 3 business days to see if she was approved for the program.

## 2015-12-15 ENCOUNTER — Ambulatory Visit: Payer: Self-pay | Admitting: Cardiovascular Disease

## 2016-01-09 ENCOUNTER — Encounter: Payer: Self-pay | Admitting: Cardiology

## 2016-01-12 ENCOUNTER — Encounter: Payer: Self-pay | Admitting: *Deleted

## 2016-01-14 NOTE — Progress Notes (Signed)
This encounter was created in error - please disregard.

## 2016-04-08 ENCOUNTER — Encounter (HOSPITAL_COMMUNITY): Payer: Self-pay | Admitting: *Deleted

## 2016-04-08 ENCOUNTER — Inpatient Hospital Stay (HOSPITAL_COMMUNITY)
Admission: AD | Admit: 2016-04-08 | Discharge: 2016-04-08 | Disposition: A | Discharge status: Home / Self Care | Payer: Medicaid Other | Source: Ambulatory Visit | Attending: Obstetrics and Gynecology | Admitting: Obstetrics and Gynecology

## 2016-04-08 DIAGNOSIS — N912 Amenorrhea, unspecified: Secondary | ICD-10-CM

## 2016-04-08 NOTE — MAU Provider Note (Signed)
[name] is a 35 y.o. G1P0100 at [redacted]w[redacted]d by LMP who presents to MAU today for pregnancy verification. The patient denies abdominal pain or vaginal bleeding today. She reports some breast tenderness.  BP 128/72 (BP Location: Right Arm)   Pulse 75   Temp 99.1 F (37.3 C) (Oral)   Resp 19   Ht 5\' 3"  (1.6 m)   Wt (!) 152.4 kg (336 lb)   LMP 02/24/2016   SpO2 100%   BMI 59.52 kg/m   CONSTITUTIONAL: Well-developed, well-nourished female in no acute distress.  ENT: External right and left ear normal.  EYES: EOM intact, conjunctivae normal.  MUSCULOSKELETAL: Normal range of motion.  CARDIOVASCULAR: Regular heart rate RESPIRATORY: Normal effort NEUROLOGICAL: Alert and oriented to person, place, and time.  SKIN: Skin is warm and dry. No rash noted. Not diaphoretic. No erythema. No pallor. PSYCH: Normal mood and affect. Normal behavior. Normal judgment and thought content.  No results found for this or any previous visit (from the past 24 hour(s)).  MDM Patient advised that without concerning symptoms today UPT will not be performed in MAU at this time  A: Amenorrhea  P: Discharge home Pt advised that routine pregnancy tests are offered in the [facility] Monday- Thursday 8-4 and Friday 8-11  Patient may return to MAU as needed or if her condition were to change or worsen   Julianne Handler, CNM  04/08/2016 8:00 PM

## 2016-04-08 NOTE — MAU Note (Signed)
Pt reports she just wants a pregnancy test. States she has never had a positive home preg test when she was pregnancy. Pt states she has missed 3 periods and is having breast tenderness.

## 2016-04-11 ENCOUNTER — Encounter (HOSPITAL_COMMUNITY): Payer: Self-pay

## 2016-04-11 ENCOUNTER — Emergency Department (HOSPITAL_COMMUNITY)
Admission: EM | Admit: 2016-04-11 | Discharge: 2016-04-11 | Disposition: A | Discharge status: Home / Self Care | Payer: Medicaid Other | Attending: Emergency Medicine | Admitting: Emergency Medicine

## 2016-04-11 DIAGNOSIS — Z3201 Encounter for pregnancy test, result positive: Secondary | ICD-10-CM | POA: Insufficient documentation

## 2016-04-11 DIAGNOSIS — Z87891 Personal history of nicotine dependence: Secondary | ICD-10-CM | POA: Insufficient documentation

## 2016-04-11 DIAGNOSIS — R197 Diarrhea, unspecified: Secondary | ICD-10-CM | POA: Diagnosis not present

## 2016-04-11 LAB — COMPREHENSIVE METABOLIC PANEL
ALT: 17 U/L (ref 14–54)
ANION GAP: 6 (ref 5–15)
AST: 17 U/L (ref 15–41)
Albumin: 3.6 g/dL (ref 3.5–5.0)
Alkaline Phosphatase: 70 U/L (ref 38–126)
BILIRUBIN TOTAL: 0.3 mg/dL (ref 0.3–1.2)
BUN: 9 mg/dL (ref 6–20)
CO2: 23 mmol/L (ref 22–32)
Calcium: 8.7 mg/dL — ABNORMAL LOW (ref 8.9–10.3)
Chloride: 107 mmol/L (ref 101–111)
Creatinine, Ser: 0.78 mg/dL (ref 0.44–1.00)
Glucose, Bld: 107 mg/dL — ABNORMAL HIGH (ref 65–99)
POTASSIUM: 3.8 mmol/L (ref 3.5–5.1)
Sodium: 136 mmol/L (ref 135–145)
TOTAL PROTEIN: 7.1 g/dL (ref 6.5–8.1)

## 2016-04-11 LAB — URINALYSIS, ROUTINE W REFLEX MICROSCOPIC
BILIRUBIN URINE: NEGATIVE
Glucose, UA: NEGATIVE mg/dL
Ketones, ur: NEGATIVE mg/dL
LEUKOCYTES UA: NEGATIVE
Nitrite: NEGATIVE
PH: 6 (ref 5.0–8.0)
Protein, ur: NEGATIVE mg/dL
SPECIFIC GRAVITY, URINE: 1.009 (ref 1.005–1.030)
SQUAMOUS EPITHELIAL / LPF: NONE SEEN

## 2016-04-11 LAB — CBC WITH DIFFERENTIAL/PLATELET
Basophils Absolute: 0 10*3/uL (ref 0.0–0.1)
Basophils Relative: 0 %
Eosinophils Absolute: 0.1 10*3/uL (ref 0.0–0.7)
Eosinophils Relative: 1 %
HEMATOCRIT: 33.4 % — AB (ref 36.0–46.0)
Hemoglobin: 11.1 g/dL — ABNORMAL LOW (ref 12.0–15.0)
LYMPHS ABS: 1.3 10*3/uL (ref 0.7–4.0)
Lymphocytes Relative: 19 %
MCH: 26.7 pg (ref 26.0–34.0)
MCHC: 33.2 g/dL (ref 30.0–36.0)
MCV: 80.3 fL (ref 78.0–100.0)
MONOS PCT: 8 %
Monocytes Absolute: 0.6 10*3/uL (ref 0.1–1.0)
NEUTROS ABS: 5.2 10*3/uL (ref 1.7–7.7)
NEUTROS PCT: 72 %
Platelets: 231 10*3/uL (ref 150–400)
RBC: 4.16 MIL/uL (ref 3.87–5.11)
RDW: 15.5 % (ref 11.5–15.5)
WBC: 7.2 10*3/uL (ref 4.0–10.5)

## 2016-04-11 LAB — PREGNANCY, URINE: Preg Test, Ur: POSITIVE — AB

## 2016-04-11 LAB — LIPASE, BLOOD: LIPASE: 19 U/L (ref 11–51)

## 2016-04-11 MED ORDER — ONDANSETRON 4 MG PO TBDP
4.0000 mg | ORAL_TABLET | Freq: Once | ORAL | Status: AC
  Administered 2016-04-11: 4 mg via ORAL
  Filled 2016-04-11: qty 1

## 2016-04-11 MED ORDER — ONDANSETRON HCL 4 MG/2ML IJ SOLN
4.0000 mg | Freq: Once | INTRAMUSCULAR | Status: DC
  Filled 2016-04-11: qty 2

## 2016-04-11 NOTE — ED Notes (Signed)
RN to start IV and will obtain blood

## 2016-04-11 NOTE — ED Provider Notes (Signed)
[facility] DEPT Provider Note   CSN: [id number] Arrival date & time: 04/11/16  0741     History   Chief Complaint Chief Complaint  Patient presents with  . Diarrhea  . Nausea    HPI Diana Blair is a 35 y.o. female with pmh of diverticulitis, obesity, PCOS, seizures presents with nausea, decreased appetite, malaise and loose stools x 2 days.  Pt states she has had 2 loose stools each day for the past two days.  Pt reports last diverticulitis flare was 10-15 years ago.  Pt states this doesn't feel like her previous diverticulitis flare.  Pt denies fevers, chest pain, shortness of breath, headaches, urinary symptoms, blood in stools, abdominal pain.  LMP 02/24/16.  Pt states her periods are irregular due to PCOS.  Pt is sexually active with husband without barrier/hormonal birth control.  Pt denies recent travel, sick contacts with similar symptoms or new foods/food poisoning.  Pt has not tried any over the counter medications for symptoms.    HPI  Past Medical History:  Diagnosis Date  . Diverticulitis   . Morbid obesity with BMI of 50.0-59.9, adult (HCC)    BMI 58.8  . PCOS (polycystic ovarian syndrome)   . Seizure [facility])     Patient Active Problem List   Diagnosis Date Noted  . Dizziness 11/22/2015  . Heart palpitations 11/20/2015  . Dyspnea on exertion 11/20/2015  . History of preterm delivery 08/20/2015  . History of cervical cerclage 08/20/2015  . Health care maintenance 11/01/2014  . PCOS (polycystic ovarian syndrome) 02/08/2013  . Morbid obesity ([place]) 02/23/2012    Past Surgical History:  Procedure Laterality Date  . CERVICAL CERCLAGE  03/13/2012   Procedure: CERCLAGE CERVICAL;  Surgeon: Jonnie Kind, MD;  Location: [facility];  Service: Gynecology;  Laterality: N/A;  . CERVICAL CERCLAGE  03/17/2012   Procedure: CERCLAGE CERVICAL;  Surgeon: Osborne Oman, MD;  Location: [facility];  Service: Gynecology;  Laterality: N/A;  Removal    OB History    Gravida  Para Term Preterm AB Living   1 1 0 1 0 0   SAB TAB Ectopic Multiple Live Births   0 0 0 0 1       Home Medications    Prior to Admission medications   Not on File    Family History Family History  Problem Relation Age of Onset  . Cancer Mother     lung  . Hypertension Mother   . Diabetes Father   . Heart failure Father   . Diabetes Paternal Grandmother   . Hypertension Sister   . Hypertension Brother   . Rashes / Skin problems Sister   . Other Neg Hx     Social History Social History  Substance Use Topics  . Smoking status: Former Smoker    Types: Cigarettes    Quit date: 02/08/2009  . Smokeless tobacco: Never Used  . Alcohol use No     Allergies   Patient has no known allergies.   Review of Systems Review of Systems  Constitutional: Positive for appetite change. Negative for chills, fever and unexpected weight change.  HENT: Negative for sore throat.   Eyes: Negative for visual disturbance.  Respiratory: Negative for chest tightness and shortness of breath.   Cardiovascular: Negative for chest pain, palpitations and leg swelling.  Gastrointestinal: Positive for diarrhea and nausea. Negative for abdominal pain, anal bleeding, blood in stool, constipation, rectal pain and vomiting.  Genitourinary: Positive for menstrual problem. Negative  for difficulty urinating, dysuria, flank pain, frequency, hematuria, pelvic pain, urgency, vaginal bleeding, vaginal discharge and vaginal pain.  Musculoskeletal: Negative for back pain.  Skin: Negative for rash.  Neurological: Negative for dizziness, light-headedness and headaches.     Physical Exam Updated Vital Signs BP 127/84 (BP Location: Right Arm)   Pulse 88   Temp 98.6 F (37 C) (Oral)   Resp 18   Ht 5\' 4"  (1.626 m)   Wt (!) 149.7 kg   LMP 02/24/2016 (Approximate)   SpO2 100%   BMI 56.64 kg/m   Physical Exam  Constitutional: She is oriented to person, place, and time. Vital signs are normal. She  appears well-developed and well-nourished. No distress.  Overweight female, alert, pleasant, cooperative.  Pt dry heaving during exam.  HENT:  Head: Normocephalic and atraumatic.  Mouth/Throat: Oropharynx is clear and moist. No oropharyngeal exudate.  Eyes: Conjunctivae and EOM are normal. Pupils are equal, round, and reactive to light. No scleral icterus.  Neck: Normal range of motion. Neck supple. No JVD present.  Cardiovascular: Normal rate, regular rhythm, normal heart sounds and intact distal pulses.   No murmur heard. Pulmonary/Chest: Effort normal. She has no wheezes.  Abdominal: Soft. Bowel sounds are normal. She exhibits no distension and no mass. There is no tenderness. There is no rebound and no guarding.  Musculoskeletal: Normal range of motion.  Lymphadenopathy:    She has no cervical adenopathy.  Neurological: She is alert and oriented to person, place, and time.  Skin: Skin is warm and dry. Capillary refill takes less than 2 seconds.  Psychiatric: She has a normal mood and affect. Her behavior is normal.  Nursing note and vitals reviewed.    ED Treatments / Results  Labs (all labs ordered are listed, but only abnormal results are displayed) Labs Reviewed  PREGNANCY, URINE - Abnormal; Notable for the following:       Result Value   Preg Test, Ur POSITIVE (*)    All other components within normal limits  CBC WITH DIFFERENTIAL/PLATELET - Abnormal; Notable for the following:    Hemoglobin 11.1 (*)    HCT 33.4 (*)    All other components within normal limits  COMPREHENSIVE METABOLIC PANEL - Abnormal; Notable for the following:    Glucose, Bld 107 (*)    Calcium 8.7 (*)    All other components within normal limits  URINALYSIS, ROUTINE W REFLEX MICROSCOPIC - Abnormal; Notable for the following:    Hgb urine dipstick SMALL (*)    Bacteria, UA RARE (*)    All other components within normal limits  LIPASE, BLOOD    EKG  EKG Interpretation None        Radiology No results found.  Procedures Procedures (including critical care time)  Medications Ordered in ED Medications  ondansetron (ZOFRAN-ODT) disintegrating tablet 4 mg (4 mg Oral Given 04/11/16 0915)     Initial Impression / Assessment and Plan / ED Course  I have reviewed the triage vital signs and the nursing notes.  Pertinent labs & imaging results that were available during my care of the patient were reviewed by me and considered in my medical decision making (see chart for details).  Clinical Course    35 yo female presents with nausea, loose stools x 2 days.  LMP 02/24/16.  VSS wnl, abdominal exam benign.  Pt does not look dehydrated.  Pt given zofran for nausea in ED which improved nausea.  ED work up included u/a, CMP, CBC, lipase which  were all unremarkable.  Urine pregnancy positive today, likely causing nausea.  Low suspicion for diverticulitis flare given short duration of loose stools without fever, abdominal pain, recent travel or sick contacts, no leukocytosis.  No further ED work up or imaging indicated at this time.  Pt will be discharged with OBGYN follow up.  Pt instructed to monitor BMs, and treat loose stools conservatively -stay well hydrated, small/mild meals.  Strict ED return instructions given -abdominal pain, fever, bloody stools, vaginal bleeding.  Pt pleasantly surprised with positive pregnancy test, states she will call OBGYN tomorrow morning to set up confirmatory ultrasound.  Pt agreeable to dispo plan.     Final Clinical Impressions(s) / ED Diagnoses   Final diagnoses:  Diarrhea, unspecified type  Positive urine pregnancy test    New Prescriptions There are no discharge medications for this patient.    Kinnie Feil, PA-C 04/11/16 1620    Tanna Furry, MD 04/15/16 [phone]

## 2016-04-11 NOTE — Discharge Instructions (Signed)
Please read attached information about "Diarrhea", "Food Choices to Help Relieve Diarrhea" and "Pregnancy Test Information"  Please monitor your bowel movements.  Stay well hydrated, drink plenty of water.  Return to emergency room if your diarrhea worsens, you notice bloody stools or fever  Your urine pregnancy test was POSITIVE today, this needs to be confirmed by ultrasound.  [facility] for [facility] with [facility].  Return to emergency room if you develop abdominal cramping, vaginal bleeding

## 2016-04-11 NOTE — ED Triage Notes (Signed)
Pt presents with c/o nausea and diarrhea. Pt reports she has been nauseated off and on for a while, approx for the past few weeks. Pt reports she was concerned that she may be pregnant but she said she took one at home that "came back invalid". Pt also c/o diarrhea that started a couple of days ago. Pt reports she also feels fatigued.

## 2016-04-12 ENCOUNTER — Encounter: Payer: Self-pay | Admitting: Medical

## 2016-04-12 ENCOUNTER — Encounter: Payer: Self-pay | Admitting: Obstetrics & Gynecology

## 2016-04-12 ENCOUNTER — Inpatient Hospital Stay (HOSPITAL_COMMUNITY): Payer: Medicaid Other

## 2016-04-12 ENCOUNTER — Inpatient Hospital Stay (HOSPITAL_COMMUNITY)
Admission: AD | Admit: 2016-04-12 | Discharge: 2016-04-12 | Disposition: A | Discharge status: Home / Self Care | Payer: Medicaid Other | Source: Ambulatory Visit | Attending: Family Medicine | Admitting: Family Medicine

## 2016-04-12 DIAGNOSIS — R110 Nausea: Secondary | ICD-10-CM | POA: Diagnosis not present

## 2016-04-12 DIAGNOSIS — Z3A14 14 weeks gestation of pregnancy: Secondary | ICD-10-CM | POA: Insufficient documentation

## 2016-04-12 DIAGNOSIS — Z833 Family history of diabetes mellitus: Secondary | ICD-10-CM | POA: Insufficient documentation

## 2016-04-12 DIAGNOSIS — R109 Unspecified abdominal pain: Secondary | ICD-10-CM | POA: Diagnosis not present

## 2016-04-12 DIAGNOSIS — O26899 Other specified pregnancy related conditions, unspecified trimester: Secondary | ICD-10-CM

## 2016-04-12 DIAGNOSIS — O418X10 Other specified disorders of amniotic fluid and membranes, first trimester, not applicable or unspecified: Secondary | ICD-10-CM | POA: Diagnosis not present

## 2016-04-12 DIAGNOSIS — O99212 Obesity complicating pregnancy, second trimester: Secondary | ICD-10-CM | POA: Diagnosis not present

## 2016-04-12 DIAGNOSIS — O99282 Endocrine, nutritional and metabolic diseases complicating pregnancy, second trimester: Secondary | ICD-10-CM | POA: Diagnosis not present

## 2016-04-12 DIAGNOSIS — O26892 Other specified pregnancy related conditions, second trimester: Secondary | ICD-10-CM | POA: Insufficient documentation

## 2016-04-12 DIAGNOSIS — Z9889 Other specified postprocedural states: Secondary | ICD-10-CM | POA: Insufficient documentation

## 2016-04-12 DIAGNOSIS — Z87891 Personal history of nicotine dependence: Secondary | ICD-10-CM | POA: Diagnosis not present

## 2016-04-12 DIAGNOSIS — O468X1 Other antepartum hemorrhage, first trimester: Secondary | ICD-10-CM

## 2016-04-12 DIAGNOSIS — O26891 Other specified pregnancy related conditions, first trimester: Secondary | ICD-10-CM

## 2016-04-12 DIAGNOSIS — Z8249 Family history of ischemic heart disease and other diseases of the circulatory system: Secondary | ICD-10-CM | POA: Diagnosis not present

## 2016-04-12 DIAGNOSIS — E282 Polycystic ovarian syndrome: Secondary | ICD-10-CM | POA: Insufficient documentation

## 2016-04-12 DIAGNOSIS — O208 Other hemorrhage in early pregnancy: Secondary | ICD-10-CM | POA: Diagnosis present

## 2016-04-12 DIAGNOSIS — Z801 Family history of malignant neoplasm of trachea, bronchus and lung: Secondary | ICD-10-CM | POA: Insufficient documentation

## 2016-04-12 LAB — CBC WITH DIFFERENTIAL/PLATELET
BASOS ABS: 0 10*3/uL (ref 0.0–0.1)
Basophils Relative: 0 %
Eosinophils Absolute: 0.2 10*3/uL (ref 0.0–0.7)
Eosinophils Relative: 3 %
HEMATOCRIT: 37.2 % (ref 36.0–46.0)
Hemoglobin: 12.2 g/dL (ref 12.0–15.0)
LYMPHS PCT: 38 %
Lymphs Abs: 2 10*3/uL (ref 0.7–4.0)
MCH: 26.4 pg (ref 26.0–34.0)
MCHC: 32.8 g/dL (ref 30.0–36.0)
MCV: 80.5 fL (ref 78.0–100.0)
MONO ABS: 0.3 10*3/uL (ref 0.1–1.0)
Monocytes Relative: 5 %
NEUTROS ABS: 2.8 10*3/uL (ref 1.7–7.7)
Neutrophils Relative %: 54 %
Platelets: 234 10*3/uL (ref 150–400)
RBC: 4.62 MIL/uL (ref 3.87–5.11)
RDW: 15.9 % — AB (ref 11.5–15.5)
WBC: 5.2 10*3/uL (ref 4.0–10.5)

## 2016-04-12 LAB — HCG, QUANTITATIVE, PREGNANCY: hCG, Beta Chain, Quant, S: 10572 m[IU]/mL — ABNORMAL HIGH (ref <5)

## 2016-04-12 LAB — URINALYSIS, ROUTINE W REFLEX MICROSCOPIC
Bilirubin Urine: NEGATIVE
GLUCOSE, UA: NEGATIVE mg/dL
Ketones, ur: 15 mg/dL — AB
LEUKOCYTES UA: NEGATIVE
Nitrite: NEGATIVE
PROTEIN: NEGATIVE mg/dL
SPECIFIC GRAVITY, URINE: 1.025 (ref 1.005–1.030)
pH: 6 (ref 5.0–8.0)

## 2016-04-12 LAB — URINALYSIS, MICROSCOPIC (REFLEX)

## 2016-04-12 LAB — WET PREP, GENITAL
CLUE CELLS WET PREP: NONE SEEN
SPERM: NONE SEEN
TRICH WET PREP: NONE SEEN
Yeast Wet Prep HPF POC: NONE SEEN

## 2016-04-12 NOTE — MAU Provider Note (Signed)
History     CSN: [id number]  Arrival date and time: 04/12/16 1352   First Provider Initiated Contact with Patient 04/12/16 1510      Chief Complaint  Patient presents with  . pelvic pressure   HPI [name] is a 35 y.o. G2P0100 at [redacted]w[redacted]d who presents to MAU today with complaint of pelvic pressure. She states that this has been present x 1 week. She denies abnormal discharge, vaginal bleeding, UTI symptoms or fever. She has had some nausea with minimal vomiting. She had diarrhea earlier this week that has resolved.    OB History    Gravida Para Term Preterm AB Living   2 1 0 1 0 0   SAB TAB Ectopic Multiple Live Births   0 0 0 0 1      Past Medical History:  Diagnosis Date  . Diverticulitis   . Morbid obesity with BMI of 50.0-59.9, adult (HCC)    BMI 58.8  . PCOS (polycystic ovarian syndrome)   . Seizure [facility])     Past Surgical History:  Procedure Laterality Date  . CERVICAL CERCLAGE  03/13/2012   Procedure: CERCLAGE CERVICAL;  Surgeon: Diana Kind, MD;  Location: [place] [facility];  Service: Gynecology;  Laterality: N/A;  . CERVICAL CERCLAGE  03/17/2012   Procedure: CERCLAGE CERVICAL;  Surgeon: Diana Oman, MD;  Location: [place] [facility];  Service: Gynecology;  Laterality: N/A;  Removal    Family History  Problem Relation Age of Onset  . Cancer Mother     lung  . Hypertension Mother   . Diabetes Father   . Heart failure Father   . Diabetes Paternal Grandmother   . Hypertension Sister   . Hypertension Brother   . Rashes / Skin problems Sister   . Other Neg Hx     Social History  Substance Use Topics  . Smoking status: Former Smoker    Types: Cigarettes    Quit date: 02/08/2009  . Smokeless tobacco: Never Used  . Alcohol use No    Allergies: No Known Allergies  No prescriptions prior to admission.    Review of Systems  Constitutional: Negative for fever and malaise/fatigue.  Gastrointestinal: Positive for abdominal pain and nausea. Negative for  constipation, diarrhea and vomiting.  Genitourinary: Negative for dysuria, frequency and urgency.       Neg - vaginal bleeding   Physical Exam   Blood pressure 126/76, pulse 81, resp. rate 20, last menstrual period 02/24/2016.  Physical Exam  Nursing note and vitals reviewed. Constitutional: She is oriented to person, place, and time. She appears well-developed and well-nourished. No distress.  HENT:  Head: Normocephalic and atraumatic.  Cardiovascular: Normal rate.   Respiratory: Effort normal.  GI: Soft. She exhibits no distension and no mass. There is no tenderness. There is no rebound and no guarding.  Genitourinary: Uterus is not enlarged (exam limited by maternal body habitus) and not tender. Cervix exhibits no motion tenderness. Right adnexum displays no mass and no tenderness. Left adnexum displays no mass and no tenderness. No bleeding in the vagina. Vaginal discharge (scant thin, white) found.  Genitourinary Comments: Cervix: closed, thick  Neurological: She is alert and oriented to person, place, and time.  Skin: Skin is warm and dry. No erythema.  Psychiatric: She has a normal mood and affect.   Results for orders placed or performed during the hospital encounter of 04/12/16 (from the past 24 hour(s))  CBC with Differential/Platelet     Status: Abnormal  Collection Time: 04/12/16  2:32 PM  Result Value Ref Range   WBC 5.2 4.0 - 10.5 K/uL   RBC 4.62 3.87 - 5.11 MIL/uL   Hemoglobin 12.2 12.0 - 15.0 g/dL   HCT 37.2 36.0 - 46.0 %   MCV 80.5 78.0 - 100.0 fL   MCH 26.4 26.0 - 34.0 pg   MCHC 32.8 30.0 - 36.0 g/dL   RDW 15.9 (H) 11.5 - 15.5 %   Platelets 234 150 - 400 K/uL   Neutrophils Relative % 54 %   Neutro Abs 2.8 1.7 - 7.7 K/uL   Lymphocytes Relative 38 %   Lymphs Abs 2.0 0.7 - 4.0 K/uL   Monocytes Relative 5 %   Monocytes Absolute 0.3 0.1 - 1.0 K/uL   Eosinophils Relative 3 %   Eosinophils Absolute 0.2 0.0 - 0.7 K/uL   Basophils Relative 0 %   Basophils  Absolute 0.0 0.0 - 0.1 K/uL  hCG, quantitative, pregnancy     Status: Abnormal   Collection Time: 04/12/16  2:32 PM  Result Value Ref Range   hCG, Beta Chain, Quant, S 10,572 (H) <5 mIU/mL  Urinalysis, Routine w reflex microscopic     Status: Abnormal   Collection Time: 04/12/16  3:06 PM  Result Value Ref Range   Color, Urine YELLOW YELLOW   APPearance CLEAR CLEAR   Specific Gravity, Urine 1.025 1.005 - 1.030   pH 6.0 5.0 - 8.0   Glucose, UA NEGATIVE NEGATIVE mg/dL   Hgb urine dipstick SMALL (A) NEGATIVE   Bilirubin Urine NEGATIVE NEGATIVE   Ketones, ur 15 (A) NEGATIVE mg/dL   Protein, ur NEGATIVE NEGATIVE mg/dL   Nitrite NEGATIVE NEGATIVE   Leukocytes, UA NEGATIVE NEGATIVE  Urinalysis, Microscopic (reflex)     Status: Abnormal   Collection Time: 04/12/16  3:06 PM  Result Value Ref Range   RBC / HPF 0-5 0 - 5 RBC/hpf   WBC, UA 0-5 0 - 5 WBC/hpf   Bacteria, UA FEW (A) NONE SEEN   Squamous Epithelial / LPF 0-5 (A) NONE SEEN  Wet prep, genital     Status: Abnormal   Collection Time: 04/12/16  3:20 PM  Result Value Ref Range   Yeast Wet Prep HPF POC NONE SEEN NONE SEEN   Trich, Wet Prep NONE SEEN NONE SEEN   Clue Cells Wet Prep HPF POC NONE SEEN NONE SEEN   WBC, Wet Prep HPF POC MODERATE (A) NONE SEEN   Sperm NONE SEEN    US Ob Comp Less 14 Wks  Result Date: 04/12/2016 CLINICAL DATA:  Pelvic pressure for 1 week, nausea EXAM: OBSTETRIC <14 WK [place] AND TRANSVAGINAL OB US TECHNIQUE: Both transabdominal and transvaginal ultrasound examinations were performed for complete evaluation of the gestation as well as the maternal uterus, adnexal regions, and pelvic cul-de-sac. Transvaginal technique was performed to assess early pregnancy. COMPARISON:  02/25/2014 FINDINGS: Intrauterine gestational sac: Single intrauterine gestation Yolk sac:  Visualized Embryo:  Visualized Cardiac Activity: Visualized Heart Rate: 121  bpm CRL:  4   mm   6 w   1 d                  [place] EDC: 12/05/2016 Subchorionic  hemorrhage: Small subchorionic hemorrhage measuring 2.1 x 0.8 cm Maternal uterus/adnexae: Unremarkable. The right ovary measures 2 by 3.5 x 2.2 cm. The left ovary measures 3.2 x 2.6 x 1.5 cm. No free fluid. IMPRESSION: 1. Single intrauterine gestation with fetal cardiac activity as above and measurements as  above 2. Small subchorionic hemorrhage Electronically Signed   By: Donavan Foil M.D.   On: 04/12/2016 16:01   US Ob Transvaginal  Result Date: 04/12/2016 CLINICAL DATA:  Pelvic pressure for 1 week, nausea EXAM: OBSTETRIC <14 WK [place] AND TRANSVAGINAL OB US TECHNIQUE: Both transabdominal and transvaginal ultrasound examinations were performed for complete evaluation of the gestation as well as the maternal uterus, adnexal regions, and pelvic cul-de-sac. Transvaginal technique was performed to assess early pregnancy. COMPARISON:  02/25/2014 FINDINGS: Intrauterine gestational sac: Single intrauterine gestation Yolk sac:  Visualized Embryo:  Visualized Cardiac Activity: Visualized Heart Rate: 121  bpm CRL:  4   mm   6 w   1 d                  [place] EDC: 12/05/2016 Subchorionic hemorrhage: Small subchorionic hemorrhage measuring 2.1 x 0.8 cm Maternal uterus/adnexae: Unremarkable. The right ovary measures 2 by 3.5 x 2.2 cm. The left ovary measures 3.2 x 2.6 x 1.5 cm. No free fluid. IMPRESSION: 1. Single intrauterine gestation with fetal cardiac activity as above and measurements as above 2. Small subchorionic hemorrhage Electronically Signed   By: Donavan Foil M.D.   On: 04/12/2016 16:01     MAU Course  Procedures None  MDM +UPT UA, wet prep, GC/chlamydia, CBC, quant hCG, HIV, RPR and [place] today to rule out ectopic pregnancy O+ blood type in Epic from previous visit  Assessment and Plan  A: SIUP at [redacted]w[redacted]d Small subchorionic hemorrhage Abdominal pain in pregnancy, first trimester   P: Discharge home Tylenol PRN for pain  First trimester and bleeding precautions discussed Patient advised to follow-up  with [facility] to start prenatal care as planned Patient may return to MAU as needed or if her condition were to change or worsen   Luvenia Redden, PA-C  04/12/2016, 4:11 PM

## 2016-04-12 NOTE — MAU Note (Signed)
Pt recently found out she was pregnant, was seen @ [facility] for diarrhea x 2 days.  Has been having pelvic pressure for over a week.  Wants a check up to see if anything is wrong & how far along she is.

## 2016-04-13 ENCOUNTER — Telehealth: Payer: Self-pay | Admitting: *Deleted

## 2016-04-13 LAB — HIV ANTIBODY (ROUTINE TESTING W REFLEX): HIV SCREEN 4TH GENERATION: NONREACTIVE

## 2016-04-13 LAB — GC/CHLAMYDIA PROBE AMP (~~LOC~~) NOT AT ARMC
CHLAMYDIA, DNA PROBE: NEGATIVE
Neisseria Gonorrhea: NEGATIVE

## 2016-04-13 LAB — RPR: RPR: NONREACTIVE

## 2016-04-13 NOTE — Telephone Encounter (Signed)
Diana Blair called yesterday am and left a voicemail she is trying to make an appointment to start prenatal care.  States she was told she has to speak to a nurse first so ultrasound can be done to find out how far along she is. States cant go to er anymore for pregnancy verification and you can';t do [place] in your office. Requests a call.

## 2016-04-15 NOTE — Telephone Encounter (Signed)
Called pt and spoke with a female who stated that Diana Blair is not there right now. I advised that I will call back later.  Pt has New Ob appt on 05/04/16 @ 1300.  She had [place] on 12/18 which showed viable pregnancy with EDD 12/05/16. No further [place] indicated at this time.

## 2016-04-22 LAB — SICKLE CELL SCREEN: Sickle Cell Screen: NORMAL

## 2016-04-22 LAB — CYSTIC FIBROSIS DIAGNOSTIC STUDY: Interpretation-CFDNA:: NEGATIVE

## 2016-04-26 NOTE — L&D Delivery Note (Signed)
36 y.o. G2P0100 at [redacted]w[redacted]d delivered a viable female infant in cephalic, ROA position. No nuchal cord, but noted to have a short cord upon delivery. Left anterior shoulder delivered with ease. Baby was vigorously crying upon delivery. 60 sec delayed cord clamping. Cord clamped x2 and cut. Placenta delivered spontaneously intact, with 3VC. Fundus firm on exam with massage and pitocin. Good hemostasis noted.  Anesthesia: Epidural Laceration: 1st degree laceration, left hymenal ring tear Suture: 3-0 Vicryl Good hemostasis noted. EBL: 150cc  Mom and baby recovering in LDR.    Apgars: APGAR (1 MIN): 9   APGAR (5 MINS): 9     Weight: Pending skin to skin  Sponge and instrument count were correct x2. Placenta sent to pathology due to IUGR.  Katherine Basset, DO OB Fellow Center for [employer] [facility] 11/12/2016, 10:35 AM

## 2016-05-02 ENCOUNTER — Inpatient Hospital Stay (HOSPITAL_COMMUNITY)
Admission: AD | Admit: 2016-05-02 | Discharge: 2016-05-02 | Disposition: A | Discharge status: Home / Self Care | Payer: Medicaid Other | Source: Ambulatory Visit | Attending: Obstetrics & Gynecology | Admitting: Obstetrics & Gynecology

## 2016-05-02 ENCOUNTER — Encounter (HOSPITAL_COMMUNITY): Payer: Self-pay | Admitting: *Deleted

## 2016-05-02 DIAGNOSIS — O99211 Obesity complicating pregnancy, first trimester: Secondary | ICD-10-CM | POA: Diagnosis not present

## 2016-05-02 DIAGNOSIS — N939 Abnormal uterine and vaginal bleeding, unspecified: Secondary | ICD-10-CM | POA: Diagnosis present

## 2016-05-02 DIAGNOSIS — Z809 Family history of malignant neoplasm, unspecified: Secondary | ICD-10-CM | POA: Diagnosis not present

## 2016-05-02 DIAGNOSIS — O99281 Endocrine, nutritional and metabolic diseases complicating pregnancy, first trimester: Secondary | ICD-10-CM | POA: Insufficient documentation

## 2016-05-02 DIAGNOSIS — Z87891 Personal history of nicotine dependence: Secondary | ICD-10-CM | POA: Diagnosis not present

## 2016-05-02 DIAGNOSIS — Z3A09 9 weeks gestation of pregnancy: Secondary | ICD-10-CM | POA: Diagnosis not present

## 2016-05-02 DIAGNOSIS — O209 Hemorrhage in early pregnancy, unspecified: Secondary | ICD-10-CM | POA: Diagnosis not present

## 2016-05-02 DIAGNOSIS — Z8249 Family history of ischemic heart disease and other diseases of the circulatory system: Secondary | ICD-10-CM | POA: Diagnosis not present

## 2016-05-02 DIAGNOSIS — Z9889 Other specified postprocedural states: Secondary | ICD-10-CM | POA: Diagnosis not present

## 2016-05-02 DIAGNOSIS — Z833 Family history of diabetes mellitus: Secondary | ICD-10-CM | POA: Insufficient documentation

## 2016-05-02 NOTE — Discharge Instructions (Signed)
Ice packs to left knee for 20 minutes daily at bedtime. Get large size support knee high socks - try men's size at medical supply store. No sex until you have been seen by the doctor.  No sex while bleeding. Keep your appointment on Tuesday. Return if you have heavy bleeding or severe lower abdominal pain.

## 2016-05-02 NOTE — MAU Provider Note (Signed)
History     CSN: [id number]  Arrival date and time: 05/02/16 [id number]   First Provider Initiated Contact with Patient 05/02/16 1852      Chief Complaint  Patient presents with  . Vaginal Bleeding   HPI Diana Blair 36 y.o. [redacted]w[redacted]d  Comes to MAU tonight with vaginal bleeding that went through her underwear.  Has a 3 cm area on her pad since coming to MAU.  Is very upset and teary.  Reviewed previous ultrasound which showed small subchorionic hemorrhage.  Is not having abdominal pain.  Has an appointment in the clinic for prenatal care on Tuesday.  Has not used contraception in 10 years.  Has PCOS and morbid obesity.  Was surprised by the pregnancy but it is a very wanted pregnancy.  Is worried she is having a miscarriage.  OB History    Gravida Para Term Preterm AB Living   2 1 0 1 0 0   SAB TAB Ectopic Multiple Live Births   0 0 0 0 1      Past Medical History:  Diagnosis Date  . Diverticulitis   . Morbid obesity with BMI of 50.0-59.9, adult (HCC)    BMI 58.8  . PCOS (polycystic ovarian syndrome)   . Seizure [facility])     Past Surgical History:  Procedure Laterality Date  . CERVICAL CERCLAGE  03/13/2012   Procedure: CERCLAGE CERVICAL;  Surgeon: Jonnie Kind, MD;  Location: [place] [facility];  Service: Gynecology;  Laterality: N/A;  . CERVICAL CERCLAGE  03/17/2012   Procedure: CERCLAGE CERVICAL;  Surgeon: Osborne Oman, MD;  Location: [place] [facility];  Service: Gynecology;  Laterality: N/A;  Removal    Family History  Problem Relation Age of Onset  . Cancer Mother     lung  . Hypertension Mother   . Diabetes Father   . Heart failure Father   . Diabetes Paternal Grandmother   . Hypertension Sister   . Hypertension Brother   . Rashes / Skin problems Sister   . Other Neg Hx     Social History  Substance Use Topics  . Smoking status: Former Smoker    Types: Cigarettes    Quit date: 02/08/2009  . Smokeless tobacco: Never Used  . Alcohol use No    Allergies: No Known  Allergies  No prescriptions prior to admission.    Review of Systems  Constitutional: Negative for fever.  Gastrointestinal: Negative for abdominal pain, nausea and vomiting.  Genitourinary: Positive for vaginal bleeding. Negative for dysuria and frequency.   Physical Exam   Blood pressure 145/62, pulse 82, temperature 98.6 F (37 C), temperature source Oral, resp. rate 18, height 5\' 4"  (1.626 m), last menstrual period 02/24/2016.  Physical Exam  Nursing note and vitals reviewed. Constitutional: She is oriented to person, place, and time. She appears well-developed and well-nourished.  HENT:  Head: Normocephalic.  Eyes: EOM are normal.  Neck: Neck supple.  GI: Soft. There is no tenderness.  Carmelia Roller, CNM did informal bedside ultrasound.  Could see baby moving and positive heart beat.  Musculoskeletal: Normal range of motion.  Neurological: She is alert and oriented to person, place, and time.  Skin: Skin is warm and dry.  Psychiatric: She has a normal mood and affect.    MAU Course  Procedures Not able to get urine specimen tonight.  Previous urine specimen did not show any glucose in urine.    MDM Client talks at length.  Requested to be out of work and  is worried about walking during this pregnancy.  Already having problems with increased swelling in left leg and in getting left knee to bend properly.  Advised that prenatal care will address these issues with her.  Assessment and Plan  Vaginal bleeding in pregnancy Viable IUP at this time. Elevated BP - will be rechecked at appointment on Tuesday.  Plan Ice packs to left knee for 20 minutes daily at bedtime. Get large size support knee high socks - try men's size at medical supply store. No sex until you have been seen by the doctor.  No sex while bleeding. Keep your appointment on Tuesday. Return if you have heavy bleeding or severe lower abdominal pain.   Diana Blair 05/02/2016, 7:30 PM

## 2016-05-02 NOTE — MAU Note (Signed)
Came in a month ago at [facility] long and found out about pregnancy.  Came to [facility] the next day and everything was good and had some blood in uterus.  Bleeding at work today. Felt like I was peeing on myself. Bright red blood in underwear.  Hx of preterm delivery at 22 weeks

## 2016-05-04 ENCOUNTER — Encounter: Payer: Self-pay | Admitting: Obstetrics and Gynecology

## 2016-05-04 ENCOUNTER — Other Ambulatory Visit (HOSPITAL_COMMUNITY)
Admission: RE | Admit: 2016-05-04 | Discharge: 2016-05-04 | Disposition: A | Discharge status: Home / Self Care | Payer: Medicaid Other | Source: Ambulatory Visit | Attending: Obstetrics and Gynecology | Admitting: Obstetrics and Gynecology

## 2016-05-04 ENCOUNTER — Ambulatory Visit (INDEPENDENT_AMBULATORY_CARE_PROVIDER_SITE_OTHER): Payer: Medicaid Other | Admitting: Obstetrics and Gynecology

## 2016-05-04 ENCOUNTER — Ambulatory Visit: Payer: Self-pay

## 2016-05-04 VITALS — BP 131/74 | HR 83 | Wt 339.1 lb

## 2016-05-04 DIAGNOSIS — O99211 Obesity complicating pregnancy, first trimester: Secondary | ICD-10-CM

## 2016-05-04 DIAGNOSIS — O10911 Unspecified pre-existing hypertension complicating pregnancy, first trimester: Secondary | ICD-10-CM

## 2016-05-04 DIAGNOSIS — O0991 Supervision of high risk pregnancy, unspecified, first trimester: Secondary | ICD-10-CM | POA: Insufficient documentation

## 2016-05-04 DIAGNOSIS — O200 Threatened abortion: Secondary | ICD-10-CM

## 2016-05-04 DIAGNOSIS — O3680X0 Pregnancy with inconclusive fetal viability, not applicable or unspecified: Secondary | ICD-10-CM | POA: Diagnosis not present

## 2016-05-04 DIAGNOSIS — Z8751 Personal history of pre-term labor: Secondary | ICD-10-CM

## 2016-05-04 DIAGNOSIS — Z3689 Encounter for other specified antenatal screening: Secondary | ICD-10-CM

## 2016-05-04 DIAGNOSIS — O09211 Supervision of pregnancy with history of pre-term labor, first trimester: Secondary | ICD-10-CM | POA: Diagnosis not present

## 2016-05-04 DIAGNOSIS — O10913 Unspecified pre-existing hypertension complicating pregnancy, third trimester: Secondary | ICD-10-CM | POA: Insufficient documentation

## 2016-05-04 DIAGNOSIS — O09511 Supervision of elderly primigravida, first trimester: Secondary | ICD-10-CM

## 2016-05-04 DIAGNOSIS — Z6841 Body Mass Index (BMI) 40.0 and over, adult: Secondary | ICD-10-CM | POA: Insufficient documentation

## 2016-05-04 DIAGNOSIS — Z113 Encounter for screening for infections with a predominantly sexual mode of transmission: Secondary | ICD-10-CM | POA: Insufficient documentation

## 2016-05-04 HISTORY — DX: Unspecified pre-existing hypertension complicating pregnancy, first trimester: O10.911

## 2016-05-04 LAB — POCT URINALYSIS DIP (DEVICE)
BILIRUBIN URINE: NEGATIVE
GLUCOSE, UA: NEGATIVE mg/dL
Ketones, ur: NEGATIVE mg/dL
LEUKOCYTES UA: NEGATIVE
NITRITE: NEGATIVE
Protein, ur: NEGATIVE mg/dL
Specific Gravity, Urine: 1.015 (ref 1.005–1.030)
UROBILINOGEN UA: 0.2 mg/dL (ref 0.0–1.0)
pH: 6.5 (ref 5.0–8.0)

## 2016-05-04 NOTE — Progress Notes (Addendum)
New OB Note  05/06/2016   Clinic: Center for [facility]  Chief Complaint: NOB  Transfer of Care Patient: no  History of Present Illness: Diana Blair is a 36 y.o. G2P0100 @ 10/0 weeks ([place] 8/7, based on Patient's last menstrual period was 02/24/2016 (approximate).=6wk u/s), with the above CC. Preg complicated by has PCOS (polycystic ovarian syndrome); Health care maintenance; History of preterm delivery; History of cervical cerclage; BMI 60.0-69.9, adult ([place]); Obesity affecting pregnancy in first trimester; AMA (advanced maternal age) primigravida 50+, first trimester; Chronic hypertension in obstetric context in first trimester; Supervision of high risk pregnancy in first trimester; and Threatened abortion in first trimester on her problem list.   Her periods were: irregular in the last two months prior to pregnancy b/c she had skipped periods She was using no method when she conceived.  She has Positive signs or symptoms of nausea/vomiting of pregnancy but very mild She has Positive signs or symptoms of miscarriage or preterm labor. Patient has had old blood spotting for the past 1-2wks, no pain On any different medications around the time she conceived/early pregnancy: No   ROS: A 12-point review of systems was performed and negative, except as stated in the above HPI.  OBGYN History: As per HPI. OB History  Gravida Para Term Preterm AB Living  2 1 0 1 0 0  SAB TAB Ectopic Multiple Live Births  0 0 0 0 1    # Outcome Date GA Lbr Len/2nd Weight Sex Delivery Anes PTL Lv  2 Current           1 Preterm 03/17/12 [redacted]w[redacted]d 00:41 / 00:07 0.329 kg (11.6 oz) M Vag-Spont Spinal, Other  ND      Any issues with any prior pregnancies: yes Any prior children are healthy, doing well, without any problems or issues: not applicable History of pap smears: Yes. Last pap smear 2017: negative with negative hpv History of STIs: Yes, remote h/o GC/CT   Past Medical History: Past Medical  History:  Diagnosis Date  . 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 Diverticulitis   . Morbid obesity with BMI of 50.0-59.9, adult (HCC)    BMI 58.8  . PCOS (polycystic ovarian syndrome)   . Seizure [facility])    as a child    Past Surgical History: Past Surgical History:  Procedure Laterality Date  . CERVICAL CERCLAGE  03/13/2012   Procedure: CERCLAGE CERVICAL;  Surgeon: Jonnie Kind, MD;  Location: [place] [facility];  Service: Gynecology;  Laterality: N/A;  . CERVICAL CERCLAGE  03/17/2012   Procedure: CERCLAGE CERVICAL;  Surgeon: Osborne Oman, MD;  Location: [place] [facility];  Service: Gynecology;  Laterality: N/A;  Removal    Family History:  Family History  Problem Relation Age of Onset  . Cancer Mother     lung  . Hypertension Mother   . Diabetes Father   . Heart failure Father   . Diabetes Paternal Grandmother   . Hypertension Sister   . Hypertension Brother   . Rashes / Skin problems Sister   . Other Neg Hx    She denies any female cancers, bleeding or blood clotting disorders.  She denies any history of mental retardation, birth defects or genetic disorders in her or the FOB's history   Social History:  Social History   Social History  . Marital status: Married    Spouse name: N/A  . Number of children: N/A  . Years of education: N/A  Occupational History  . Not on file.   Social History Main Topics  . Smoking status: Former Smoker    Types: Cigarettes    Quit date: 02/08/2009  . Smokeless tobacco: Never Used  . Alcohol use No  . Drug use: No  . Sexual activity: Yes    Birth control/ protection: None   Other Topics Concern  . Not on file   Social History Narrative  . No narrative on file   Works at [employer] smoking when she found out she was pregnant  Allergy: No Known Allergies  Health Maintenance:  Mammogram Up to Date: not applicable  Current Outpatient Medications: PNV  Physical Exam:   BP 131/74   Pulse  83   Wt (!) 153.8 kg (339 lb 1.6 oz)   LMP 02/24/2016 (Approximate)   BMI 58.21 kg/m  Body mass index is 58.21 kg/m. FHTs: 170s by bedside u/s  General appearance: Well nourished, well developed female in no acute distress.  Neck:  Supple, normal appearance, and no thyromegaly  Cardiovascular: S1, S2 normal, no murmur, rub or gallop, regular rate and rhythm Respiratory:  Clear to auscultation bilateral. Normal respiratory effort Abdomen: positive bowel sounds and no masses, hernias; diffusely non tender to palpation, non distended. obese Breasts: breasts appear normal, no suspicious masses, no skin or nipple changes or axillary nodes, and normal palpation. Neuro/Psych:  Normal mood and affect.  Skin:  Warm and dry.  Lymphatic:  No inguinal lymphadenopathy.   Pelvic exam: is limited by body habitus EGBUS: within normal limits Vagina: old blood in the vault (minimal) Cervix: visually closed and appears long. No active bleeding. NTTP Bimanual: deferred  Laboratory: 12/18: negative u/a, wet prep, hiv, rpr, cbc, cmp  Imaging:  As above  Assessment: pt stable.   Plan: 1.  Supervision of high risk pregnancy in first trimester Routine PNC.  - US OB Limited - TSH - Prenatal Profile - GC/Chlamydia probe amp ([place])not at [facility] - Hemoglobinopathy Evaluation - AMB referral to maternal fetal medicine - Culture, OB Urine - Pain Mgmt, Profile 6 Conf w/o mM, U - Cystic fibrosis diagnostic study - Varicella Zoster IgG and IgM - Protein / Creatinine Ratio, Urine  3. AMA (advanced maternal age) primigravida 42+, first trimester D/w pt re: options and she'd like to talk to [facility] about any interventions - AMB MFM GENETICS REFERRAL  4. Obesity affecting pregnancy in first trimester Pt to come back for early 2hr GTT Baseline pre-eclampsia labs today  5. History of preterm delivery Prior pregnancy reviewed; patient had NOB in 2013 at 18wks and had normal anatomy scan with TAUS CL  of >3cm.  Approximately 2wks after that anatomy scan, patient presented to the MAU with vaginal discharge and concern for PPROM at 20-21wks. amnisure negative but patient noted to have visible bag of water coming through the cervical os and u/s didn't show oligo. Patient taken back for rescue cerclage ([place]).  A few days later, purulent discharge noted and patient had cerclage removed and then had delivery. D/w her recommend mfm consult. I told her I believe she's a candidate for 17p, but will have them d/w her re: serial cx lengths at 16wks or consideration for ppx cerclage at 14-16wks.  I told her I'd lean towards CLs and cerclage if any signs of cx shortening, which is what she was desiring but they will talk to her more about this.  6. Chronic hypertension in obstetric context in first trimester See above. Start baby ASA  after 12wks if no more VB  7. Threatened abortion in first trimester D/w pt that increases risk for miscarriage and issues during the pregnancy, but no interventions can be done at this time except watchful waiting. ER precautions give. Patient is O  Problem list reviewed and updated.  Follow up in 2 weeks.  >50% of 35 min visit spent on counseling and coordination of care.     Durene Romans MD Attending Center for [facility])

## 2016-05-04 NOTE — Progress Notes (Signed)
Pt informed that the ultrasound is considered a limited OB ultrasound and is not intended to be a complete ultrasound exam.  Patient also informed that the ultrasound is not being completed with the intent of assessing for fetal or placental anomalies or any pelvic abnormalities.  Explained that the purpose of today's ultrasound is to assess for viability.  Patient acknowledges the purpose of the exam and the limitations of the study.    Single IUP;  FHR = 178 bpm per M-mode

## 2016-05-05 LAB — PAIN MGMT, PROFILE 6 CONF W/O MM, U
6 ACETYLMORPHINE: NEGATIVE ng/mL (ref <10)
ALCOHOL METABOLITES: NEGATIVE ng/mL (ref <500)
AMPHETAMINES: NEGATIVE ng/mL (ref <500)
Barbiturates: NEGATIVE ng/mL (ref <300)
Benzodiazepines: NEGATIVE ng/mL (ref <100)
COCAINE METABOLITE: NEGATIVE ng/mL (ref <150)
Creatinine: 106.1 mg/dL (ref >=20.0)
Marijuana Metabolite: NEGATIVE ng/mL (ref <20)
Methadone Metabolite: NEGATIVE ng/mL (ref <100)
OXIDANT: NEGATIVE ug/mL (ref <200)
Opiates: NEGATIVE ng/mL (ref <100)
Oxycodone: NEGATIVE ng/mL (ref <100)
PLEASE NOTE: 0
Phencyclidine: NEGATIVE ng/mL (ref <25)
pH: 6.81 (ref 4.5–9.0)

## 2016-05-05 LAB — CULTURE, OB URINE: Organism ID, Bacteria: NO GROWTH

## 2016-05-05 LAB — PRENATAL PROFILE (SOLSTAS)
ANTIBODY SCREEN: NEGATIVE
Basophils Absolute: 0 cells/uL (ref 0–200)
Basophils Relative: 0 %
EOS PCT: 2 %
Eosinophils Absolute: 134 cells/uL (ref 15–500)
HEMATOCRIT: 36.3 % (ref 35.0–45.0)
HEMOGLOBIN: 11.7 g/dL (ref 11.7–15.5)
HEP B S AG: NEGATIVE
HIV 1&2 Ab, 4th Generation: NONREACTIVE
LYMPHS ABS: 1608 {cells}/uL (ref 850–3900)
LYMPHS PCT: 24 %
MCH: 26.5 pg — ABNORMAL LOW (ref 27.0–33.0)
MCHC: 32.2 g/dL (ref 32.0–36.0)
MCV: 82.3 fL (ref 80.0–100.0)
MONOS PCT: 7 %
MPV: 10.1 fL (ref 7.5–12.5)
Monocytes Absolute: 469 cells/uL (ref 200–950)
NEUTROS PCT: 67 %
Neutro Abs: 4489 cells/uL (ref 1500–7800)
Platelets: 238 10*3/uL (ref 140–400)
RBC: 4.41 MIL/uL (ref 3.80–5.10)
RDW: 16.1 % — ABNORMAL HIGH (ref 11.0–15.0)
RH TYPE: POSITIVE
Rubella: 4.55 Index — ABNORMAL HIGH (ref <0.90)
WBC: 6.7 10*3/uL (ref 3.8–10.8)

## 2016-05-05 LAB — GC/CHLAMYDIA PROBE AMP (~~LOC~~) NOT AT ARMC
Chlamydia: NEGATIVE
NEISSERIA GONORRHEA: NEGATIVE

## 2016-05-05 LAB — PROTEIN / CREATININE RATIO, URINE
Creatinine, Urine: 119 mg/dL (ref 20–320)
Protein Creatinine Ratio: 101 mg/g creat (ref 21–161)
TOTAL PROTEIN, URINE: 12 mg/dL (ref 5–24)

## 2016-05-05 LAB — TSH: TSH: 0.91 m[IU]/L

## 2016-05-06 ENCOUNTER — Encounter: Payer: Self-pay | Admitting: Obstetrics and Gynecology

## 2016-05-06 LAB — VARICELLA ZOSTER IGG AND IGM
VARICELLA IGG: 1792 {index} — AB (ref <135.00)
Varicella Zoster Ab IgM: <=0.9 (ref <=0.90)

## 2016-05-07 ENCOUNTER — Encounter (HOSPITAL_COMMUNITY): Payer: Self-pay

## 2016-05-07 ENCOUNTER — Telehealth: Payer: Self-pay | Admitting: *Deleted

## 2016-05-07 ENCOUNTER — Encounter (HOSPITAL_COMMUNITY): Payer: Medicaid Other

## 2016-05-07 ENCOUNTER — Ambulatory Visit (HOSPITAL_COMMUNITY): Payer: Medicaid Other

## 2016-05-07 ENCOUNTER — Ambulatory Visit (HOSPITAL_COMMUNITY)
Admission: RE | Admit: 2016-05-07 | Discharge: 2016-05-07 | Disposition: A | Discharge status: Home / Self Care | Payer: Medicaid Other | Source: Ambulatory Visit | Attending: Obstetrics and Gynecology | Admitting: Obstetrics and Gynecology

## 2016-05-07 VITALS — BP 119/66 | HR 76 | Temp 98.5°F | Wt 339.8 lb

## 2016-05-07 DIAGNOSIS — Z8751 Personal history of pre-term labor: Secondary | ICD-10-CM | POA: Insufficient documentation

## 2016-05-07 DIAGNOSIS — Z3A10 10 weeks gestation of pregnancy: Secondary | ICD-10-CM | POA: Insufficient documentation

## 2016-05-07 DIAGNOSIS — O09299 Supervision of pregnancy with other poor reproductive or obstetric history, unspecified trimester: Secondary | ICD-10-CM

## 2016-05-07 DIAGNOSIS — O418X10 Other specified disorders of amniotic fluid and membranes, first trimester, not applicable or unspecified: Secondary | ICD-10-CM

## 2016-05-07 DIAGNOSIS — O09511 Supervision of elderly primigravida, first trimester: Secondary | ICD-10-CM

## 2016-05-07 DIAGNOSIS — O468X1 Other antepartum hemorrhage, first trimester: Secondary | ICD-10-CM

## 2016-05-07 DIAGNOSIS — O3431 Maternal care for cervical incompetence, first trimester: Secondary | ICD-10-CM

## 2016-05-07 HISTORY — DX: Palpitations: R00.2

## 2016-05-07 LAB — HEMOGLOBINOPATHY EVALUATION
HCT: 36.3 % (ref 35.0–45.0)
HGB A2 QUANT: 2 % (ref 1.8–3.5)
Hemoglobin: 11.7 g/dL (ref 11.7–15.5)
Hgb A: 97 % (ref >96.0)
MCH: 26.5 pg — ABNORMAL LOW (ref 27.0–33.0)
MCV: 82.3 fL (ref 80.0–100.0)
RBC: 4.41 MIL/uL (ref 3.80–5.10)
RDW: 16.1 % — AB (ref 11.0–15.0)

## 2016-05-07 LAB — CYSTIC FIBROSIS DIAGNOSTIC STUDY

## 2016-05-07 NOTE — Progress Notes (Signed)
Genetic Counseling  High-Risk Gestation Note  Appointment Date:  05/07/2016 Referred By: Diana Halim, MD Date of Birth:  1980/12/13   Pregnancy History: G2P0100 Estimated Date of Delivery: 11/30/16 Estimated Gestational Age: [redacted]w[redacted]d Attending: Viann Fish, MD   [name] was seen for genetic counseling because of a maternal age of 36 years old at delivery.  She was seen for MFM consultation today given her pregnancy history. See separate MFM consult note for detailed discussion.   In summary:  Discussed AMA and associated risk for fetal aneuploidy  Discussed options for screening  First screen- elected to pursue, scheduled for 05/25/16  NIPS- may consider pending first screen results  Ultrasound- NT ultrasound scheduled 05/25/16  Discussed diagnostic testing options   [facility]S-declined  Amniocentesis- declined  MFM consult today given patient's pregnancy history; see separate MFM consult note for detailed discussion  Discussed carrier screening options  CF- drawn 05/04/16 at [facility] office  SMA- declined today but interested in pursuing at a later date  Hemoglobinopathies- drawn 05/04/16 at [facility] office  She was counseled regarding maternal age and the association with risk for chromosome conditions due to nondisjunction with aging of the ova.   We reviewed chromosomes, nondisjunction, and the associated 1 in 114 risk for fetal aneuploidy at [redacted]w[redacted]d gestation related to a maternal age of 36 years old at delivery.  She was counseled that the risk for aneuploidy decreases as gestational age increases, accounting for those pregnancies which spontaneously abort.  We specifically discussed Down syndrome (trisomy 24), trisomies 55 and 68, and sex chromosome aneuploidies (47,XXX and 47,XXY) including the common features and prognoses of each.   We reviewed available screening options including First Screen, Quad screen, noninvasive prenatal screening (NIPS)/cell free DNA (cfDNA)  screening, and detailed ultrasound.  She was counseled that screening tests are used to modify a patient's a priori risk for aneuploidy, typically based on age. This estimate provides a pregnancy specific risk assessment. We reviewed the benefits and limitations of each option. Specifically, we discussed the conditions for which each test screens, the detection rates, and false positive rates of each. She was also counseled regarding diagnostic testing via CVS and amniocentesis. We reviewed the approximate 1 in [id number] risk for complications from amniocentesis, including spontaneous pregnancy loss. We discussed the possible results that the tests might provide including: positive, negative, unanticipated, and no result. Finally, they were counseled regarding the cost of each option and potential out of pocket expenses.  After consideration of all the options, she elected to proceed with first trimester screening, scheduled for 05/25/16. She may elected to pursue NIPS pending results of First trimester screening. Ms. Berryhill stated that she is not interested in amniocentesis in the pregnancy given the associated risk for complications.  She understands that screening tests cannot rule out all birth defects or genetic syndromes.  [name] was provided with written information regarding cystic fibrosis (CF), spinal muscular atrophy (SMA) and hemoglobinopathies including the carrier frequency, availability of carrier screening and prenatal diagnosis if indicated.  In addition, we discussed that CF and hemoglobinopathies are routinely screened for as part of the  newborn screening panel. CF carrier screening and hemoglobin electrophoresis were drawn 05/04/16 through the patient's [facility] provider, and results are currently pending.  After further discussion, she expressed possible interest in pursuing SMA at a later date.  Detailed family histories were not reviewed given that the patient reported feeling sick  and stated that she needed to leave. She reported no  known family history of birth defects, intellectual disability, or known genetic conditions. Consanguinity was denied.   I counseled [name] regarding the above risks and available options.  The approximate face-to-face time with the genetic counselor was 30 minutes.  Diana Oman, MS,  Certified Genetic Counselor 05/07/2016

## 2016-05-07 NOTE — ED Notes (Signed)
Pt passing small amt of brownish bleeding.

## 2016-05-07 NOTE — Progress Notes (Signed)
MFM consult: ? I reviewed with the patient the pathophysiology of preterm delivery in pregnancy and that often the etiology of the preterm delivery is not ascertained from the clinical scenario. In this patient's case she had a presentation at 21 weeks of leaking fluid, ruled out for ruptured membranes by Amnisure, and had a rescue cerclage due to advanced cervical dilation then subsequently experienced pPROM with evidence of infection, leading to cerclage removal and delivery.  The presentation could have been pPROM (preterm premature rupture of membranes undetectable by clinical exam or Amnisure due to <100% sensitivity), CI (cervical insufficiency), PTL (preterm labor), abruption, or a mix of any or all of these.  ? Infection may be a cause of preterm labor and preterm delivery. Advanced cervical dilation can also lead to infection; therefore, there are 2 mechanisms that could be contributing to the preterm delivery. Nonetheless the recurrence for spontaneous preterm birth is 2 times higher than the general population in women who experience a prior preterm delivery and the actual risk can be in the range of 50% to 60%. The risk of preterm delivery increases with each prior preterm birth. I did remind the patient that there is no treatment for preterm delivery, but there are ways to follow the next pregnancy in order to make appropriate interventions.   IM Progesterone [facility]) is clearly indicated for this patient.  The patient is now well versed on need for progesterone and desires the injections to reduce her risk. It is important to note findings of the Prevention of Recurrent Preterm Delivery by 17 Alpha-Hydroxyprogesterone Caproate Study in the [place] of [employer], June 2003. The results of this study showed the use of the progesterone reduced the risk of preterm delivery less than 37 weeks to 36% compared to 55% in the control group. Delivery less than 32 weeks was decreased to 12% in the  progesterone group compared to 20% in the control group. This was found to be a significant difference between the groups.  ? Therefore, with her history of preterm delivery, I recommend starting 17-alpha progesterone injections at [redacted] weeks gestation and continuing weekly injections until [redacted] weeks gestation. The mechanism of action of the progesterone is not entirely known, there are some theories that the progesterone relaxes the myometrium and leads to thick cervical mucus which prevents ascending infection from vaginal microorganisms as well as providing some protection from inflammation. ? Now, what is unclear is whether she truly has cervical insufficiency.  I spent a considerable amount of time debating prophylactic cerclage at 13-15 weeks with her versus cervical length screening beginning at 16 weeks with possibility for ultrasound indicated cerclage up to 24 weeks for a cervical length <2.5cm.  Additionally, I would continue cervical length screening up to 28 weeks for the purpose of ensuring antenatal corticosteroid administration for short cervix from 23-28 weeks or for prometrium 200mg  administration PV qhs for any notation of short cervix from 16-28 weeks to be continued vaginally until 36 weeks in addition to [facility].    I additionally recommend she be offered a first trimester screen at 12 weeks in our office to be paired contemporaneously with reevaluation of subchorionic hemorrhage noted on your ultrasound.  Maternal serum AFP screen for ONTD is recommended from 15-[redacted] weeks gestational age. Genetic risk assessment is important because genetic aneuploidy also plays a role in risk for preterm delivery.  ? I recommend a follow up 18-week scan to complete anatomic survey with cervical length assessments every 2 weeks from 16-28  weeks (all scheduled in our unit today). The length of the cervix every 2 weeks beginning at 16 weeks will provide a risk assessment for preterm delivery in that pregnancy  based on the length of the cervix at that gestational age.  All of these appointments were arranged for your patient today prior to her departure. ? Recommendations: 1. 17P (Makena) IM q week from 16-36 weeks   2. Cervical length q2 weeks from 16-28 weeks 3. 1st trimester screen and reevaluation of [facility] in our office in 2 weeks (~[redacted] weeks GA)  4. msAFP 15-20 weeks (screen for ONTD) 5. Survey of anatomy at 18 weeks 5. Cerclage (ultrasound-indicated) if cervical length is <2.5 cm given hx of very early PTB (unclear etiology).  Note:  patient may opt for prophylactic cerclage if desired anytime after 13 weeks.  She has personally opted for cervical length surveillance with ultrasound-indicated cerclage, owing to the fact that she may not need cerclage should cervical length remain 2.5cm or more. 6. prometrium 200mg  pv qhs until 36 weeks if cervical length is ever <2.5cm 7. Preterm labor precautions ? Time Spent: I spent in excess of 60 minutes in consultation with this patient to review records, evaluate her case, and provide her with an adequate discussion and education. More than 50% of this time was spent in direct face-to-face counseling. ? It was a pleasure seeing your patient in the office today. Thank you for consultation. Please do not hesitate to contact our service for any further questions.  ? Thank you, ? Delman Cheadle Denney  ? Marijean Bravo, MD, MS, FACOG Assistant Professor Section of [place]

## 2016-05-07 NOTE — Telephone Encounter (Signed)
Danice called 05/06/16 pm and left a message she was seen for her first prenatal visit 05/04/16 . States during visit doctor asked if she could check with [facility] to see if they could do a workup because of her previous heart issues. States when she call them to verify if they accept medicaid they told her she had to have a list of what we were checking for in particular and to call medicaid to see if they cover those procedures. She wants someone to call her about doing this.  Also wants a rx sent in to her pharmacy for prenatal vitamins because cheaper than otc.

## 2016-05-11 ENCOUNTER — Other Ambulatory Visit (HOSPITAL_COMMUNITY): Payer: Self-pay | Admitting: *Deleted

## 2016-05-11 DIAGNOSIS — O09529 Supervision of elderly multigravida, unspecified trimester: Secondary | ICD-10-CM

## 2016-05-11 DIAGNOSIS — O3430 Maternal care for cervical incompetence, unspecified trimester: Secondary | ICD-10-CM

## 2016-05-11 MED ORDER — PRENATAL VITAMINS 0.8 MG PO TABS: 1.0000 | ORAL_TABLET | Freq: Every day | ORAL | 12 refills | Status: DC

## 2016-05-11 NOTE — Telephone Encounter (Signed)
Per chart review, there are no MD notes stating that she needs cardiac work up. This can be discussed further at her next prenatal visit on 1/24.  Rx got prenatal vitamins sent as requested. Message left for pt that Rx has been sent to her pharmacy and she should speak to the doctor about the cardiac referral at her next visit.

## 2016-05-12 ENCOUNTER — Other Ambulatory Visit: Payer: Medicaid Other

## 2016-05-14 ENCOUNTER — Inpatient Hospital Stay (HOSPITAL_COMMUNITY)
Admission: AD | Admit: 2016-05-14 | Discharge: 2016-05-15 | Disposition: A | Discharge status: Home / Self Care | Payer: Medicaid Other | Source: Ambulatory Visit | Attending: Obstetrics and Gynecology | Admitting: Obstetrics and Gynecology

## 2016-05-14 ENCOUNTER — Inpatient Hospital Stay (HOSPITAL_COMMUNITY): Payer: Medicaid Other

## 2016-05-14 ENCOUNTER — Encounter (HOSPITAL_COMMUNITY): Payer: Self-pay | Admitting: *Deleted

## 2016-05-14 DIAGNOSIS — Z79899 Other long term (current) drug therapy: Secondary | ICD-10-CM | POA: Insufficient documentation

## 2016-05-14 DIAGNOSIS — O99281 Endocrine, nutritional and metabolic diseases complicating pregnancy, first trimester: Secondary | ICD-10-CM | POA: Diagnosis not present

## 2016-05-14 DIAGNOSIS — O26891 Other specified pregnancy related conditions, first trimester: Secondary | ICD-10-CM | POA: Insufficient documentation

## 2016-05-14 DIAGNOSIS — Z87891 Personal history of nicotine dependence: Secondary | ICD-10-CM | POA: Diagnosis not present

## 2016-05-14 DIAGNOSIS — IMO0002 Reserved for concepts with insufficient information to code with codable children: Secondary | ICD-10-CM

## 2016-05-14 DIAGNOSIS — Z3A11 11 weeks gestation of pregnancy: Secondary | ICD-10-CM | POA: Insufficient documentation

## 2016-05-14 DIAGNOSIS — Z8249 Family history of ischemic heart disease and other diseases of the circulatory system: Secondary | ICD-10-CM | POA: Insufficient documentation

## 2016-05-14 DIAGNOSIS — O99211 Obesity complicating pregnancy, first trimester: Secondary | ICD-10-CM | POA: Diagnosis not present

## 2016-05-14 DIAGNOSIS — R102 Pelvic and perineal pain: Secondary | ICD-10-CM | POA: Diagnosis not present

## 2016-05-14 DIAGNOSIS — Z9889 Other specified postprocedural states: Secondary | ICD-10-CM | POA: Diagnosis not present

## 2016-05-14 DIAGNOSIS — N949 Unspecified condition associated with female genital organs and menstrual cycle: Secondary | ICD-10-CM | POA: Diagnosis not present

## 2016-05-14 DIAGNOSIS — Z801 Family history of malignant neoplasm of trachea, bronchus and lung: Secondary | ICD-10-CM | POA: Diagnosis not present

## 2016-05-14 DIAGNOSIS — E282 Polycystic ovarian syndrome: Secondary | ICD-10-CM | POA: Insufficient documentation

## 2016-05-14 DIAGNOSIS — Z833 Family history of diabetes mellitus: Secondary | ICD-10-CM | POA: Insufficient documentation

## 2016-05-14 DIAGNOSIS — O209 Hemorrhage in early pregnancy, unspecified: Secondary | ICD-10-CM

## 2016-05-14 LAB — CBC
HEMATOCRIT: 33.1 % — AB (ref 36.0–46.0)
HEMOGLOBIN: 11.2 g/dL — AB (ref 12.0–15.0)
MCH: 27.1 pg (ref 26.0–34.0)
MCHC: 33.8 g/dL (ref 30.0–36.0)
MCV: 80.1 fL (ref 78.0–100.0)
Platelets: 230 10*3/uL (ref 150–400)
RBC: 4.13 MIL/uL (ref 3.87–5.11)
RDW: 15.3 % (ref 11.5–15.5)
WBC: 8.2 10*3/uL (ref 4.0–10.5)

## 2016-05-14 NOTE — MAU Provider Note (Signed)
History     CSN: [id number]  Arrival date and time: 05/14/16 2155   First Provider Initiated Contact with Patient 05/14/16 2242      Chief Complaint  Patient presents with  . Abdominal Pain   HPI Diana Blair is a 36 y.o. G2P0100 at [redacted]w[redacted]d who presents with abdominal pain. Reports abdominal pain in her RLQ that has been intermittent for the last 3 days. Describes pain as dull and rates 2/10. Has not treated. Came in tonight because she was concerned that the pain has not gone away at this point. Denies n/v/d, fever, vaginal discharge, dysuria, or recent intercourse. Has had vaginal bleeding for the last month & was previously diagnosed with a [facility]. For the last several weeks the bleeding has been brown spotting. Constipated earlier this week. Took metamucil which resulted in BM. Last BM was 2 days ago.   OB History    Gravida Para Term Preterm AB Living   2 1 0 1 0 0   SAB TAB Ectopic Multiple Live Births   0 0 0 0 1      Past Medical History:  Diagnosis Date  . 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 Diverticulitis   . Heart palpitations   . Morbid obesity with BMI of 50.0-59.9, adult (HCC)    BMI 58.8  . PCOS (polycystic ovarian syndrome)   . Seizure [facility])    as a child    Past Surgical History:  Procedure Laterality Date  . CERVICAL CERCLAGE  03/13/2012   Procedure: CERCLAGE CERVICAL;  Surgeon: Jonnie Kind, MD;  Location: [facility];  Service: Gynecology;  Laterality: N/A;  . CERVICAL CERCLAGE  03/17/2012   Procedure: CERCLAGE CERVICAL;  Surgeon: Osborne Oman, MD;  Location: [facility];  Service: Gynecology;  Laterality: N/A;  Removal    Family History  Problem Relation Age of Onset  . Cancer Mother     lung  . Hypertension Mother   . Diabetes Father   . Heart failure Father   . Diabetes Paternal Grandmother   . Hypertension Sister   . Hypertension Brother   . Rashes / Skin problems Sister   . Other Neg Hx     Social  History  Substance Use Topics  . Smoking status: Former Smoker    Types: Cigarettes    Quit date: 02/08/2009  . Smokeless tobacco: Never Used  . Alcohol use No    Allergies: No Known Allergies  Prescriptions Prior to Admission  Medication Sig Dispense Refill Last Dose  . Prenatal Multivit-Min-Fe-FA (PRENATAL VITAMINS) 0.8 MG tablet Take 1 tablet by mouth daily. 30 tablet 12   . Prenatal Vit-Fe Fumarate-FA (PRENATAL VITAMIN PO) Take by mouth.   Not Taking    Review of Systems  Constitutional: Negative.   Gastrointestinal: Positive for abdominal pain and constipation. Negative for diarrhea, nausea and vomiting.  Genitourinary: Positive for vaginal bleeding and vaginal discharge. Negative for dysuria.   Physical Exam   Blood pressure 139/73, pulse 92, temperature 98.9 F (37.2 C), resp. rate 20, height 5\' 4"  (1.626 m), weight (!) 345 lb 12.8 oz (156.9 kg), last menstrual period 02/24/2016.  Physical Exam  Nursing note and vitals reviewed. Constitutional: She is oriented to person, place, and time. She appears well-developed and well-nourished. No distress.  HENT:  Head: Normocephalic and atraumatic.  Eyes: Conjunctivae are normal. Right eye exhibits no discharge. Left eye exhibits no discharge. No scleral icterus.  Neck: Normal range of motion.  Cardiovascular: Normal rate, regular rhythm and normal heart sounds.   No murmur heard. Respiratory: Effort normal and breath sounds normal. No respiratory distress. She has no wheezes.  GI: Soft. Bowel sounds are normal. There is no tenderness. There is no rebound and no guarding.  Neurological: She is alert and oriented to person, place, and time.  Skin: Skin is warm and dry. She is not diaphoretic.  Psychiatric: She has a normal mood and affect. Her behavior is normal. Judgment and thought content normal.    MAU Course  Procedures Results for orders placed or performed during the hospital encounter of 05/14/16 (from the past 24  hour(s))  Urinalysis, Routine w reflex microscopic     Status: Abnormal   Collection Time: 05/14/16 10:30 PM  Result Value Ref Range   Color, Urine YELLOW YELLOW   APPearance CLEAR CLEAR   Specific Gravity, Urine 1.029 1.005 - 1.030   pH 5.0 5.0 - 8.0   Glucose, UA NEGATIVE NEGATIVE mg/dL   Hgb urine dipstick SMALL (A) NEGATIVE   Bilirubin Urine NEGATIVE NEGATIVE   Ketones, ur 5 (A) NEGATIVE mg/dL   Protein, ur NEGATIVE NEGATIVE mg/dL   Nitrite NEGATIVE NEGATIVE   Leukocytes, UA NEGATIVE NEGATIVE   RBC / HPF 0-5 0 - 5 RBC/hpf   WBC, UA 0-5 0 - 5 WBC/hpf   Bacteria, UA NONE SEEN NONE SEEN   Squamous Epithelial / LPF 0-5 (A) NONE SEEN   Mucous PRESENT   CBC     Status: Abnormal   Collection Time: 05/14/16 11:17 PM  Result Value Ref Range   WBC 8.2 4.0 - 10.5 K/uL   RBC 4.13 3.87 - 5.11 MIL/uL   Hemoglobin 11.2 (L) 12.0 - 15.0 g/dL   HCT 33.1 (L) 36.0 - 46.0 %   MCV 80.1 78.0 - 100.0 fL   MCH 27.1 26.0 - 34.0 pg   MCHC 33.8 30.0 - 36.0 g/dL   RDW 15.3 11.5 - 15.5 %   Platelets 230 150 - 400 K/uL    MDM RH positive Unable to doppler FHT d/t body habitus CBC Ultrasound for cardiac activity & assess bleeding  Care turned over to [place], NP 05/15/2016 12:03 AM   Addendum: Right groin discomfort is positional, no cramping.  Has vaginal irritation and pasty brown discharge with concern she has yeast > WP sent  US reveals FHR 165, tiny SCH> reassurance given  Assessment and Plan  Round ligament pain  Fetal heart tones not heard - Plan: US OB Comp Less 14 Wks, US OB Comp Less 14 Wks  Vaginal bleeding in pregnancy, first trimester - Plan: US OB Comp Less 14 Wks, US OB Comp Less 69 Wks  36 yo G2P0100 at [redacted]w[redacted]d with viable IUP Right round ligament pain [place] Poor OB Hx with detailed POC per MFM in place  Allergies as of 05/15/2016   No Known Allergies     Medication List    TAKE these medications   PRENATAL VITAMIN PO Take by mouth.    Prenatal Vitamins 0.8 MG tablet Take 1 tablet by mouth daily.      Follow-up [place] for [place] Follow up on 05/19/2016.   Specialty:  Obstetrics and Gynecology Contact information: [address] [place] [phone]

## 2016-05-14 NOTE — MAU Note (Addendum)
Having pain in R lower side off and on for few days. They have looked at it before and I have PCOS. No one has looked at ovaries in a long time. Has been bleeding since 05/02/16. Bright red for a day or two when started but since then brown in color. Husband dx with flu today

## 2016-05-15 DIAGNOSIS — N949 Unspecified condition associated with female genital organs and menstrual cycle: Secondary | ICD-10-CM

## 2016-05-15 LAB — URINALYSIS, ROUTINE W REFLEX MICROSCOPIC
BILIRUBIN URINE: NEGATIVE
Bacteria, UA: NONE SEEN
Glucose, UA: NEGATIVE mg/dL
KETONES UR: 5 mg/dL — AB
LEUKOCYTES UA: NEGATIVE
Nitrite: NEGATIVE
Protein, ur: NEGATIVE mg/dL
SPECIFIC GRAVITY, URINE: 1.029 (ref 1.005–1.030)
pH: 5 (ref 5.0–8.0)

## 2016-05-15 LAB — WET PREP, GENITAL
CLUE CELLS WET PREP: NONE SEEN
Sperm: NONE SEEN
TRICH WET PREP: NONE SEEN
YEAST WET PREP: NONE SEEN

## 2016-05-15 NOTE — Discharge Instructions (Signed)
Vaginal Bleeding During Pregnancy, First Trimester A small amount of bleeding (spotting) from the vagina is relatively common in early pregnancy. It usually stops on its own. Various things may cause bleeding or spotting in early pregnancy. Some bleeding may be related to the pregnancy, and some may not. In most cases, the bleeding is normal and is not a problem. However, bleeding can also be a sign of something serious. Be sure to tell your health care provider about any vaginal bleeding right away. Some possible causes of vaginal bleeding during the first trimester include:  Infection or inflammation of the cervix.  Growths (polyps) on the cervix.  Miscarriage or threatened miscarriage.  Pregnancy tissue has developed outside of the uterus and in a fallopian tube (tubal pregnancy).  Tiny cysts have developed in the uterus instead of pregnancy tissue (molar pregnancy). Follow these instructions at home: Watch your condition for any changes. The following actions may help to lessen any discomfort you are feeling:  Follow your health care provider's instructions for limiting your activity. If your health care provider orders bed rest, you may need to stay in bed and only get up to use the bathroom. However, your health care provider may allow you to continue light activity.  If needed, make plans for someone to help with your regular activities and responsibilities while you are on bed rest.  Keep track of the number of pads you use each day, how often you change pads, and how soaked (saturated) they are. Write this down.  Do not use tampons. Do not douche.  Do not have sexual intercourse or orgasms until approved by your health care provider.  If you pass any tissue from your vagina, save the tissue so you can show it to your health care provider.  Only take over-the-counter or prescription medicines as directed by your health care provider.  Do not take aspirin because it can make  you bleed.  Keep all follow-up appointments as directed by your health care provider. Contact a health care provider if:  You have any vaginal bleeding during any part of your pregnancy.  You have cramps or labor pains.  You have a fever, not controlled by medicine. Get help right away if:  You have severe cramps in your back or belly (abdomen).  You pass large clots or tissue from your vagina.  Your bleeding increases.  You feel light-headed or weak, or you have fainting episodes.  You have chills.  You are leaking fluid or have a gush of fluid from your vagina.  You pass out while having a bowel movement. This information is not intended to replace advice given to you by your health care provider. Make sure you discuss any questions you have with your health care provider. Document Released: 01/20/2005 Document Revised: 09/18/2015 Document Reviewed: 12/18/2012 Elsevier Interactive Patient Education  2017 [place]. Round Ligament Pain Introduction The round ligament is a cord of muscle and tissue that helps to support the uterus. It can become a source of pain during pregnancy if it becomes stretched or twisted as the baby grows. The pain usually begins in the second trimester of pregnancy, and it can come and go until the baby is delivered. It is not a serious problem, and it does not cause harm to the baby. Round ligament pain is usually a short, sharp, and pinching pain, but it can also be a dull, lingering, and aching pain. The pain is felt in the lower side of the abdomen  or in the groin. It usually starts deep in the groin and moves up to the outside of the hip area. Pain can occur with:  A sudden change in position.  Rolling over in bed.  Coughing or sneezing.  Physical activity. Follow these instructions at home: Watch your condition for any changes. Take these steps to help with your pain:  When the pain starts, relax. Then try:  Sitting down.  Flexing  your knees up to your abdomen.  Lying on your side with one pillow under your abdomen and another pillow between your legs.  Sitting in a warm bath for 15-20 minutes or until the pain goes away.  Take over-the-counter and prescription medicines only as told by your health care provider.  Move slowly when you sit and stand.  Avoid long walks if they cause pain.  Stop or lessen your physical activities if they cause pain. Contact a health care provider if:  Your pain does not go away with treatment.  You feel pain in your back that you did not have before.  Your medicine is not helping. Get help right away if:  You develop a fever or chills.  You develop uterine contractions.  You develop vaginal bleeding.  You develop nausea or vomiting.  You develop diarrhea.  You have pain when you urinate. This information is not intended to replace advice given to you by your health care provider. Make sure you discuss any questions you have with your health care provider. Document Released: 01/20/2008 Document Revised: 09/18/2015 Document Reviewed: 06/19/2014  2017 Elsevier

## 2016-05-19 ENCOUNTER — Ambulatory Visit: Payer: Self-pay

## 2016-05-19 ENCOUNTER — Ambulatory Visit (INDEPENDENT_AMBULATORY_CARE_PROVIDER_SITE_OTHER): Payer: Medicaid Other | Admitting: Obstetrics and Gynecology

## 2016-05-19 VITALS — BP 116/70 | HR 79 | Wt 347.5 lb

## 2016-05-19 DIAGNOSIS — E669 Obesity, unspecified: Secondary | ICD-10-CM

## 2016-05-19 DIAGNOSIS — O10911 Unspecified pre-existing hypertension complicating pregnancy, first trimester: Secondary | ICD-10-CM

## 2016-05-19 DIAGNOSIS — O09211 Supervision of pregnancy with history of pre-term labor, first trimester: Secondary | ICD-10-CM

## 2016-05-19 DIAGNOSIS — O200 Threatened abortion: Secondary | ICD-10-CM

## 2016-05-19 DIAGNOSIS — O09212 Supervision of pregnancy with history of pre-term labor, second trimester: Secondary | ICD-10-CM | POA: Diagnosis not present

## 2016-05-19 DIAGNOSIS — O3680X0 Pregnancy with inconclusive fetal viability, not applicable or unspecified: Secondary | ICD-10-CM

## 2016-05-19 DIAGNOSIS — O09511 Supervision of elderly primigravida, first trimester: Secondary | ICD-10-CM

## 2016-05-19 DIAGNOSIS — Z8751 Personal history of pre-term labor: Secondary | ICD-10-CM

## 2016-05-19 DIAGNOSIS — O0991 Supervision of high risk pregnancy, unspecified, first trimester: Secondary | ICD-10-CM

## 2016-05-19 DIAGNOSIS — O10912 Unspecified pre-existing hypertension complicating pregnancy, second trimester: Secondary | ICD-10-CM | POA: Diagnosis not present

## 2016-05-19 DIAGNOSIS — O99211 Obesity complicating pregnancy, first trimester: Secondary | ICD-10-CM

## 2016-05-19 NOTE — Progress Notes (Signed)
Pt informed that the ultrasound is considered a limited OB ultrasound and is not intended to be a complete ultrasound exam.  Patient also informed that the ultrasound is not being completed with the intent of assessing for fetal or placental anomalies or any pelvic abnormalities.  Explained that the purpose of today's ultrasound is to assess for viability.  Patient acknowledges the purpose of the exam and the limitations of the study.   FHR - 170 bpm per PW doppler

## 2016-05-19 NOTE — Progress Notes (Signed)
Prenatal Visit Note Date: 05/19/2016 Clinic: [facility]  Subjective:  Diana Blair is a 36 y.o. G2P0100 at [redacted]w[redacted]d being seen today for ongoing prenatal care.  She is currently monitored for the following issues for this high-risk pregnancy and has PCOS (polycystic ovarian syndrome); Health care maintenance; History of preterm delivery; History of cervical cerclage; BMI 60.0-69.9, adult ([place]); Obesity affecting pregnancy in first trimester; AMA (advanced maternal age) primigravida 65+, first trimester; Chronic hypertension in obstetric context in first trimester; Supervision of high risk pregnancy in first trimester; and Threatened abortion in first trimester on her problem list.  Patient reports rare spotting.    . Vag. Bleeding: Scant.   . Denies leaking of fluid.   The following portions of the patient's history were reviewed and updated as appropriate: allergies, current medications, past family history, past medical history, past social history, past surgical history and problem list. Problem list updated.  Objective:   Vitals:   05/19/16 0950  BP: 116/70  Pulse: 79  Weight: (!) 347 lb 8 oz (157.6 kg)    Fetal Status: Fetal Heart Rate (bpm): u/s       (170s)  General:  Alert, oriented and cooperative. Patient is in no acute distress.  Skin: Skin is warm and dry. No rash noted.   Cardiovascular: Normal heart rate noted  Respiratory: Normal respiratory effort, no problems with respiration noted  Abdomen: Soft, gravid, appropriate for gestational age. Pain/Pressure: Present     Pelvic:  Cervical exam deferred        Extremities: Normal range of motion.  Edema: None  Mental Status: Normal mood and affect. Normal behavior. Normal judgment and thought content.   Urinalysis:      Assessment and Plan:  Pregnancy: G2P0100 at [redacted]w[redacted]d  1. Supervision of high risk pregnancy in first trimester Early GDM screening today. Will hold off on baby ASA given still having  occasional spotting. No chest pain, SOB, DOE.  - 2Hr GTT w/ 1 Hr Carpenter 75 g  2. Encounter to determine fetal viability of pregnancy, single or unspecified fetus See above.  - US OB Limited  3. Threatened abortion in first trimester Rh pos. Continue with exp management  4. Obesity affecting pregnancy in first trimester See above. Advised patient on weight gain and would avoid any for the rest of pregnancy. Healthy eating habits d/w pt.   5. History of preterm delivery Seen by MFM and recommended 16wk 17p and CLs and serially. Will have her sign 17p paperwork today. U/s already set up  6. AMA (advanced maternal age) primigravida 66+, first trimester Seen by [facility]. For NT scan next week. Offer AFP nv  7. Chronic hypertension in obstetric context in first trimester Negative baseline labs. Hold off on baby ASA since having some spotting  Preterm labor symptoms and general obstetric precautions including but not limited to vaginal bleeding, contractions, leaking of fluid and fetal movement were reviewed in detail with the patient. Please refer to After Visit Summary for other counseling recommendations.  Return in about 3 weeks (around 06/09/2016).   Aletha Halim, MD

## 2016-05-20 LAB — 2HR GTT W 1 HR, CARPENTER, 75 G
GLUCOSE, 2 HR, GEST: 108 mg/dL (ref <153)
Glucose, 1 Hr, Gest: 91 mg/dL (ref <180)
Glucose, Fasting, Gest: 89 mg/dL (ref 65–91)

## 2016-05-25 ENCOUNTER — Ambulatory Visit (HOSPITAL_COMMUNITY)
Admission: RE | Admit: 2016-05-25 | Discharge: 2016-05-25 | Disposition: A | Discharge status: Home / Self Care | Payer: Medicaid Other | Source: Ambulatory Visit | Attending: Obstetrics and Gynecology | Admitting: Obstetrics and Gynecology

## 2016-05-25 ENCOUNTER — Encounter (HOSPITAL_COMMUNITY): Payer: Self-pay

## 2016-05-25 DIAGNOSIS — Z3A13 13 weeks gestation of pregnancy: Secondary | ICD-10-CM | POA: Diagnosis not present

## 2016-05-25 DIAGNOSIS — O09211 Supervision of pregnancy with history of pre-term labor, first trimester: Secondary | ICD-10-CM | POA: Diagnosis not present

## 2016-05-25 DIAGNOSIS — O09291 Supervision of pregnancy with other poor reproductive or obstetric history, first trimester: Secondary | ICD-10-CM | POA: Diagnosis not present

## 2016-05-25 DIAGNOSIS — O99211 Obesity complicating pregnancy, first trimester: Secondary | ICD-10-CM | POA: Insufficient documentation

## 2016-05-25 DIAGNOSIS — O10011 Pre-existing essential hypertension complicating pregnancy, first trimester: Secondary | ICD-10-CM | POA: Insufficient documentation

## 2016-05-25 DIAGNOSIS — O09521 Supervision of elderly multigravida, first trimester: Secondary | ICD-10-CM | POA: Insufficient documentation

## 2016-05-25 DIAGNOSIS — Z3682 Encounter for antenatal screening for nuchal translucency: Secondary | ICD-10-CM | POA: Insufficient documentation

## 2016-05-25 DIAGNOSIS — O09529 Supervision of elderly multigravida, unspecified trimester: Secondary | ICD-10-CM

## 2016-05-28 ENCOUNTER — Other Ambulatory Visit: Payer: Self-pay

## 2016-06-09 ENCOUNTER — Ambulatory Visit (INDEPENDENT_AMBULATORY_CARE_PROVIDER_SITE_OTHER): Payer: Medicaid Other | Admitting: Obstetrics and Gynecology

## 2016-06-09 VITALS — BP 135/75 | HR 91 | Wt 348.0 lb

## 2016-06-09 DIAGNOSIS — O0991 Supervision of high risk pregnancy, unspecified, first trimester: Secondary | ICD-10-CM

## 2016-06-09 DIAGNOSIS — O99211 Obesity complicating pregnancy, first trimester: Secondary | ICD-10-CM

## 2016-06-09 DIAGNOSIS — O10912 Unspecified pre-existing hypertension complicating pregnancy, second trimester: Secondary | ICD-10-CM

## 2016-06-09 DIAGNOSIS — O200 Threatened abortion: Secondary | ICD-10-CM

## 2016-06-09 DIAGNOSIS — B373 Candidiasis of vulva and vagina: Secondary | ICD-10-CM

## 2016-06-09 DIAGNOSIS — O10911 Unspecified pre-existing hypertension complicating pregnancy, first trimester: Secondary | ICD-10-CM

## 2016-06-09 DIAGNOSIS — Z8751 Personal history of pre-term labor: Secondary | ICD-10-CM

## 2016-06-09 DIAGNOSIS — O09212 Supervision of pregnancy with history of pre-term labor, second trimester: Secondary | ICD-10-CM | POA: Diagnosis not present

## 2016-06-09 DIAGNOSIS — O09511 Supervision of elderly primigravida, first trimester: Secondary | ICD-10-CM

## 2016-06-09 DIAGNOSIS — O99212 Obesity complicating pregnancy, second trimester: Secondary | ICD-10-CM

## 2016-06-09 DIAGNOSIS — O98812 Other maternal infectious and parasitic diseases complicating pregnancy, second trimester: Secondary | ICD-10-CM

## 2016-06-09 DIAGNOSIS — O09512 Supervision of elderly primigravida, second trimester: Secondary | ICD-10-CM

## 2016-06-09 DIAGNOSIS — B3731 Acute candidiasis of vulva and vagina: Secondary | ICD-10-CM

## 2016-06-09 MED ORDER — CLOTRIMAZOLE 2 % VA CREA: 1.0000 | TOPICAL_CREAM | Freq: Every day | VAGINAL | Status: AC

## 2016-06-09 NOTE — Progress Notes (Signed)
Subjective:     Patient ID: Diana Blair, female   DOB: [birth year], 36 y.o.   MRN: [id number]  HPI Diana Blair is a 36 year old G2P0100 presenting for routine prenatal visit.  She reports mild vaginal and vulvar irritation that began over 1 month ago.  She was diagnosed with subchorionic hemorrhage by U/S on 04/12/16 and had vaginal bleeding for a month that resolved about 2 weeks ago.  She also endorses some bilateral lower extremity edema throughout this pregnancy.  She currently denies vaginal bleeding, large fluid leakage, vaginal discharge, fever, n/v, abdominal pain, dysuria, increased urinary frequency or urgency.  Pt would prefer not to use OTC monostat for yeast infection given this medication was not effective in the past.   Review of Systems As above     Objective:   Physical Exam  CV : regular rate and rhythm, no murmurs, rubs or gallops Pulm: clear to auscultation bilaterally, no increased work of breathing, no wheezes or crackles GU: mild perineal and vulvar erythema Extrem: 1+ non-pitting lower extremity edema bilaterally    Assessment:     Diana Blair is a 36 year old [id number] at [redacted]w[redacted]d complicated by small subchorionic hemorrhage on U/S at 5 weeks presenting for routine prenatal care.  Pt's vaginal bleeding 2/2 subchorionic hemorrhage has resolved and repeat U/S at 12 weeks was reassuring.  Pt's vaginal irritation / itching consistent with yeast infection on GU exam.    Plan:     -Prescribed clotrimazole for vaginal yeast infection. -AFP test today; counseled pt on screening test and answered all questions. -Advised pt to keep next appointment for 17p administration

## 2016-06-09 NOTE — Progress Notes (Signed)
Prenatal Visit Note Date: 06/09/2016 Clinic: [facility]  Subjective:  Diana Blair is a 36 y.o. G2P0100 at [redacted]w[redacted]d being seen today for ongoing prenatal care.  She is currently monitored for the following issues for this high-risk pregnancy and has PCOS (polycystic ovarian syndrome); Health care maintenance; History of preterm delivery; History of cervical cerclage; BMI 60.0-69.9, adult ([place]); Obesity affecting pregnancy in first trimester; AMA (advanced maternal age) primigravida 11+, first trimester; Chronic hypertension in obstetric context in first trimester; Supervision of high risk pregnancy in first trimester; and Threatened abortion in first trimester on her problem list.  Patient reports vaginal irritation. No bleeding or discharge Contractions: Not present. Vag. Bleeding: None.  Movement: Present. Denies leaking of fluid.   The following portions of the patient's history were reviewed and updated as appropriate: allergies, current medications, past family history, past medical history, past social history, past surgical history and problem list. Problem list updated.  Objective:   Vitals:   06/09/16 1345  BP: 135/75  Pulse: 91  Weight: (!) 348 lb (157.9 kg)    Fetal Status:     Movement: Present     General:  Alert, oriented and cooperative. Patient is in no acute distress.  Skin: Skin is warm and dry. No rash noted.   Cardiovascular: Normal heart rate noted  Respiratory: Normal respiratory effort, no problems with respiration noted  Abdomen: Soft, gravid, appropriate for gestational age. Pain/Pressure: Present     Pelvic:  EGBUS with mild b/l erythema and wghite cottage cheese like d/c at the introitus  Extremities: Normal range of motion.  Edema: None  Mental Status: Normal mood and affect. Normal behavior. Normal judgment and thought content.   Urinalysis:      Assessment and Plan:  Pregnancy: G2P0100 at [redacted]w[redacted]d  1. Chronic hypertension in  obstetric context in first trimester Continue baby ASA. No change in plan of care  2. AMA (advanced maternal age) primigravida 48+, first trimester Low risk 1st trimester screen. Pt declined cffdna. F/u AFP and anatomy scan (already scheduled)  3. History of preterm delivery Getting 17p and serial CLs starting at 16wks  4. Obesity affecting pregnancy in first trimester Negative early 1hr and baseline pre-x labs  5. Supervision of high risk pregnancy in first trimester - Alpha fetoprotein, maternal  6. Threatened abortion in first trimester Hasn't bled since january  7. Vulvovaginal candidiasis Rx cream sent in.   Preterm labor symptoms and general obstetric precautions including but not limited to vaginal bleeding, contractions, leaking of fluid and fetal movement were reviewed in detail with the patient. Please refer to After Visit Summary for other counseling recommendations.  Return in about 1 week (around 06/16/2016) for 1wk 17p only visit. can be on same day as u/s if okay with pt. 3wk ROB  Aletha Halim, MD

## 2016-06-09 NOTE — Progress Notes (Signed)
Pt c/o irritation on outside of vaginal area and some itchiness

## 2016-06-10 DIAGNOSIS — B3731 Acute candidiasis of vulva and vagina: Secondary | ICD-10-CM | POA: Insufficient documentation

## 2016-06-10 DIAGNOSIS — B373 Candidiasis of vulva and vagina: Secondary | ICD-10-CM | POA: Insufficient documentation

## 2016-06-10 MED ORDER — CLOTRIMAZOLE 1 % VA CREA: 1.0000 | TOPICAL_CREAM | Freq: Every day | VAGINAL | 3 refills | Status: DC

## 2016-06-10 NOTE — Addendum Note (Signed)
Addended by: Verita Schneiders A on: 06/10/2016 05:06 PM   Modules accepted: Orders

## 2016-06-14 LAB — ALPHA FETOPROTEIN, MATERNAL
AFP: 16.9 ng/mL
Curr Gest Age: 15.1 weeks
MoM for AFP: 1.03
OPEN SPINA BIFIDA: NEGATIVE

## 2016-06-15 ENCOUNTER — Ambulatory Visit (HOSPITAL_COMMUNITY)
Admission: RE | Admit: 2016-06-15 | Discharge: 2016-06-15 | Disposition: A | Discharge status: Home / Self Care | Payer: Medicaid Other | Source: Ambulatory Visit | Attending: Obstetrics and Gynecology | Admitting: Obstetrics and Gynecology

## 2016-06-15 ENCOUNTER — Encounter (HOSPITAL_COMMUNITY): Payer: Self-pay

## 2016-06-15 ENCOUNTER — Other Ambulatory Visit (HOSPITAL_COMMUNITY): Payer: Self-pay | Admitting: Obstetrics and Gynecology

## 2016-06-15 ENCOUNTER — Ambulatory Visit (INDEPENDENT_AMBULATORY_CARE_PROVIDER_SITE_OTHER): Payer: Medicaid Other | Admitting: General Practice

## 2016-06-15 VITALS — BP 105/58 | HR 89 | Ht 63.0 in | Wt 340.0 lb

## 2016-06-15 DIAGNOSIS — Z8751 Personal history of pre-term labor: Secondary | ICD-10-CM

## 2016-06-15 DIAGNOSIS — O09292 Supervision of pregnancy with other poor reproductive or obstetric history, second trimester: Secondary | ICD-10-CM | POA: Insufficient documentation

## 2016-06-15 DIAGNOSIS — O09212 Supervision of pregnancy with history of pre-term labor, second trimester: Secondary | ICD-10-CM | POA: Insufficient documentation

## 2016-06-15 DIAGNOSIS — Z3682 Encounter for antenatal screening for nuchal translucency: Secondary | ICD-10-CM | POA: Diagnosis present

## 2016-06-15 DIAGNOSIS — Z3A16 16 weeks gestation of pregnancy: Secondary | ICD-10-CM

## 2016-06-15 DIAGNOSIS — O10012 Pre-existing essential hypertension complicating pregnancy, second trimester: Secondary | ICD-10-CM | POA: Insufficient documentation

## 2016-06-15 DIAGNOSIS — O99211 Obesity complicating pregnancy, first trimester: Secondary | ICD-10-CM

## 2016-06-15 DIAGNOSIS — O3430 Maternal care for cervical incompetence, unspecified trimester: Secondary | ICD-10-CM

## 2016-06-15 DIAGNOSIS — O99212 Obesity complicating pregnancy, second trimester: Secondary | ICD-10-CM | POA: Insufficient documentation

## 2016-06-15 DIAGNOSIS — O0991 Supervision of high risk pregnancy, unspecified, first trimester: Secondary | ICD-10-CM

## 2016-06-15 DIAGNOSIS — O09522 Supervision of elderly multigravida, second trimester: Secondary | ICD-10-CM | POA: Diagnosis not present

## 2016-06-15 MED ORDER — HYDROXYPROGESTERONE CAPROATE 250 MG/ML IM OIL
250.0000 mg | TOPICAL_OIL | INTRAMUSCULAR | Status: AC
  Administered 2016-06-15 – 2016-10-26 (×19): 250 mg via INTRAMUSCULAR

## 2016-06-15 NOTE — Progress Notes (Signed)
i

## 2016-06-22 ENCOUNTER — Ambulatory Visit (INDEPENDENT_AMBULATORY_CARE_PROVIDER_SITE_OTHER): Payer: Medicaid Other | Admitting: Clinical

## 2016-06-22 ENCOUNTER — Ambulatory Visit (INDEPENDENT_AMBULATORY_CARE_PROVIDER_SITE_OTHER): Payer: Medicaid Other | Admitting: *Deleted

## 2016-06-22 VITALS — BP 116/45 | HR 79 | Wt 343.8 lb

## 2016-06-22 DIAGNOSIS — Z8751 Personal history of pre-term labor: Secondary | ICD-10-CM

## 2016-06-22 DIAGNOSIS — O09212 Supervision of pregnancy with history of pre-term labor, second trimester: Secondary | ICD-10-CM | POA: Diagnosis not present

## 2016-06-22 DIAGNOSIS — F4322 Adjustment disorder with anxiety: Secondary | ICD-10-CM

## 2016-06-22 NOTE — Progress Notes (Signed)
Pt presented for scheduled 17p injection.  She was tearful and reported having multiple social problems regarding her home life. After injection, pt was taken to [facility] for [facility] visit.
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
## 2016-06-23 ENCOUNTER — Ambulatory Visit: Payer: Medicaid Other

## 2016-06-29 ENCOUNTER — Other Ambulatory Visit (HOSPITAL_COMMUNITY): Payer: Self-pay | Admitting: Obstetrics and Gynecology

## 2016-06-29 ENCOUNTER — Ambulatory Visit (HOSPITAL_COMMUNITY)
Admission: RE | Admit: 2016-06-29 | Discharge: 2016-06-29 | Disposition: A | Discharge status: Home / Self Care | Payer: Medicaid Other | Source: Ambulatory Visit | Attending: Obstetrics and Gynecology | Admitting: Obstetrics and Gynecology

## 2016-06-29 ENCOUNTER — Encounter (HOSPITAL_COMMUNITY): Payer: Self-pay

## 2016-06-29 ENCOUNTER — Ambulatory Visit (INDEPENDENT_AMBULATORY_CARE_PROVIDER_SITE_OTHER): Payer: Medicaid Other | Admitting: Obstetrics & Gynecology

## 2016-06-29 VITALS — BP 128/52 | HR 79 | Wt 340.0 lb

## 2016-06-29 DIAGNOSIS — O09529 Supervision of elderly multigravida, unspecified trimester: Secondary | ICD-10-CM

## 2016-06-29 DIAGNOSIS — O10012 Pre-existing essential hypertension complicating pregnancy, second trimester: Secondary | ICD-10-CM

## 2016-06-29 DIAGNOSIS — O09212 Supervision of pregnancy with history of pre-term labor, second trimester: Secondary | ICD-10-CM | POA: Insufficient documentation

## 2016-06-29 DIAGNOSIS — O09299 Supervision of pregnancy with other poor reproductive or obstetric history, unspecified trimester: Secondary | ICD-10-CM

## 2016-06-29 DIAGNOSIS — O99212 Obesity complicating pregnancy, second trimester: Secondary | ICD-10-CM | POA: Diagnosis not present

## 2016-06-29 DIAGNOSIS — Z3A18 18 weeks gestation of pregnancy: Secondary | ICD-10-CM

## 2016-06-29 DIAGNOSIS — O3430 Maternal care for cervical incompetence, unspecified trimester: Secondary | ICD-10-CM

## 2016-06-29 DIAGNOSIS — O09522 Supervision of elderly multigravida, second trimester: Secondary | ICD-10-CM | POA: Insufficient documentation

## 2016-06-29 DIAGNOSIS — O0991 Supervision of high risk pregnancy, unspecified, first trimester: Secondary | ICD-10-CM

## 2016-06-29 DIAGNOSIS — O99211 Obesity complicating pregnancy, first trimester: Secondary | ICD-10-CM

## 2016-06-29 DIAGNOSIS — O09292 Supervision of pregnancy with other poor reproductive or obstetric history, second trimester: Secondary | ICD-10-CM | POA: Insufficient documentation

## 2016-06-29 DIAGNOSIS — O10912 Unspecified pre-existing hypertension complicating pregnancy, second trimester: Secondary | ICD-10-CM | POA: Diagnosis not present

## 2016-06-29 DIAGNOSIS — Z363 Encounter for antenatal screening for malformations: Secondary | ICD-10-CM | POA: Insufficient documentation

## 2016-06-29 NOTE — Progress Notes (Signed)
Swelling/pain in left leg 17-p

## 2016-06-29 NOTE — Patient Instructions (Signed)
Second Trimester of Pregnancy The second trimester is from week 13 through week 28, month 4 through 6. This is often the time in pregnancy that you feel your best. Often times, morning sickness has lessened or quit. You may have more energy, and you may get hungry more often. Your unborn baby (fetus) is growing rapidly. At the end of the sixth month, he or she is about 9 inches long and weighs about 1 pounds. You will likely feel the baby move (quickening) between 18 and 20 weeks of pregnancy. Follow these instructions at home:  Avoid all smoking, herbs, and alcohol. Avoid drugs not approved by your doctor.  Do not use any tobacco products, including cigarettes, chewing tobacco, and electronic cigarettes. If you need help quitting, ask your doctor. You may get counseling or other support to help you quit.  Only take medicine as told by your doctor. Some medicines are safe and some are not during pregnancy.  Exercise only as told by your doctor. Stop exercising if you start having cramps.  Eat regular, healthy meals.  Wear a good support bra if your breasts are tender.  Do not use hot tubs, steam rooms, or saunas.  Wear your seat belt when driving.  Avoid raw meat, uncooked cheese, and liter boxes and soil used by cats.  Take your prenatal vitamins.  Take 1500-2000 milligrams of calcium daily starting at the 20th week of pregnancy until you deliver your baby.  Try taking medicine that helps you poop (stool softener) as needed, and if your doctor approves. Eat more fiber by eating fresh fruit, vegetables, and whole grains. Drink enough fluids to keep your pee (urine) clear or pale yellow.  Take warm water baths (sitz baths) to soothe pain or discomfort caused by hemorrhoids. Use hemorrhoid cream if your doctor approves.  If you have puffy, bulging veins (varicose veins), wear support hose. Raise (elevate) your feet for 15 minutes, 3-4 times a day. Limit salt in your diet.  Avoid heavy  lifting, wear low heals, and sit up straight.  Rest with your legs raised if you have leg cramps or low back pain.  Visit your dentist if you have not gone during your pregnancy. Use a soft toothbrush to brush your teeth. Be gentle when you floss.  You can have sex (intercourse) unless your doctor tells you not to.  Go to your doctor visits. Get help if:  You feel dizzy.  You have mild cramps or pressure in your lower belly (abdomen).  You have a nagging pain in your belly area.  You continue to feel sick to your stomach (nauseous), throw up (vomit), or have watery poop (diarrhea).  You have bad smelling fluid coming from your vagina.  You have pain with peeing (urination). Get help right away if:  You have a fever.  You are leaking fluid from your vagina.  You have spotting or bleeding from your vagina.  You have severe belly cramping or pain.  You lose or gain weight rapidly.  You have trouble catching your breath and have chest pain.  You notice sudden or extreme puffiness (swelling) of your face, hands, ankles, feet, or legs.  You have not felt the baby move in over an hour.  You have severe headaches that do not go away with medicine.  You have vision changes. This information is not intended to replace advice given to you by your health care provider. Make sure you discuss any questions you have with your health care   provider. Document Released: 07/07/2009 Document Revised: 09/18/2015 Document Reviewed: 06/13/2012 Elsevier Interactive Patient Education  2017 Elsevier Inc.  

## 2016-06-29 NOTE — Progress Notes (Signed)
   PRENATAL VISIT NOTE  Subjective:  Diana Blair is a 36 y.o. G2P0100 at [redacted]w[redacted]d being seen today for ongoing prenatal care.  She is currently monitored for the following issues for this high-risk pregnancy and has PCOS (polycystic ovarian syndrome); Health care maintenance; History of preterm delivery; History of cervical cerclage; BMI 60.0-69.9, adult ([place]); Obesity affecting pregnancy in first trimester; AMA (advanced maternal age) primigravida 50+, first trimester; Chronic hypertension in obstetric context in first trimester; Supervision of high risk pregnancy in first trimester; Threatened abortion in first trimester; and Vulvovaginal candidiasis on her problem list.  Patient reports left knee pain and requests referral for vision exam.  Contractions: Not present. Vag. Bleeding: None.  Movement: Absent. Denies leaking of fluid.   The following portions of the patient's history were reviewed and updated as appropriate: allergies, current medications, past family history, past medical history, past social history, past surgical history and problem list. Problem list updated.  Objective:   Vitals:   06/29/16 1440  BP: (!) 128/52  Pulse: 79  Weight: (!) 340 lb (154.2 kg)    Fetal Status:     Movement: Absent     General:  Alert, oriented and cooperative. Patient is in no acute distress.  Skin: Skin is warm and dry. No rash noted.   Cardiovascular: Normal heart rate noted  Respiratory: Normal respiratory effort, no problems with respiration noted  Abdomen: Soft, gravid, appropriate for gestational age. Pain/Pressure: Present     Pelvic:  Cervical exam deferred        Extremities: Normal range of motion.  Edema: None, mild left knee tenderness no deformity  Mental Status: Normal mood and affect. Normal behavior. Normal judgment and thought content.   Assessment and Plan:  Pregnancy: G2P0100 at [redacted]w[redacted]d  1. Obesity affecting pregnancy in first trimester Left knee pain, exam not c/w  DVT - Ambulatory referral to Orthopedic Surgery  2. Supervision of high risk pregnancy in first trimester  - Ambulatory referral to Orthopedic Surgery  Preterm labor symptoms and general obstetric precautions including but not limited to vaginal bleeding, contractions, leaking of fluid and fetal movement were reviewed in detail with the patient. Please refer to After Visit Summary for other counseling recommendations.  Return in about 4 weeks (around 07/27/2016).   Woodroe Mode, MD

## 2016-07-01 ENCOUNTER — Telehealth: Payer: Self-pay

## 2016-07-01 NOTE — Telephone Encounter (Signed)
Patient wanted to know if she can take norco in pregnancy. Per Dr.Pickens patient can take this medication for dental work.

## 2016-07-06 ENCOUNTER — Encounter (HOSPITAL_COMMUNITY): Payer: Self-pay

## 2016-07-06 ENCOUNTER — Other Ambulatory Visit (HOSPITAL_COMMUNITY): Payer: Self-pay | Admitting: Maternal and Fetal Medicine

## 2016-07-06 ENCOUNTER — Ambulatory Visit (INDEPENDENT_AMBULATORY_CARE_PROVIDER_SITE_OTHER): Payer: Medicaid Other | Admitting: *Deleted

## 2016-07-06 ENCOUNTER — Encounter: Payer: Self-pay | Admitting: *Deleted

## 2016-07-06 ENCOUNTER — Ambulatory Visit (HOSPITAL_COMMUNITY)
Admission: RE | Admit: 2016-07-06 | Discharge: 2016-07-06 | Disposition: A | Discharge status: Home / Self Care | Payer: Medicaid Other | Source: Ambulatory Visit | Attending: Obstetrics and Gynecology | Admitting: Obstetrics and Gynecology

## 2016-07-06 VITALS — BP 137/66 | HR 79

## 2016-07-06 DIAGNOSIS — Z3A19 19 weeks gestation of pregnancy: Secondary | ICD-10-CM | POA: Insufficient documentation

## 2016-07-06 DIAGNOSIS — O99210 Obesity complicating pregnancy, unspecified trimester: Secondary | ICD-10-CM

## 2016-07-06 DIAGNOSIS — O09522 Supervision of elderly multigravida, second trimester: Secondary | ICD-10-CM

## 2016-07-06 DIAGNOSIS — O09299 Supervision of pregnancy with other poor reproductive or obstetric history, unspecified trimester: Secondary | ICD-10-CM

## 2016-07-06 DIAGNOSIS — O10012 Pre-existing essential hypertension complicating pregnancy, second trimester: Secondary | ICD-10-CM | POA: Insufficient documentation

## 2016-07-06 DIAGNOSIS — O09212 Supervision of pregnancy with history of pre-term labor, second trimester: Secondary | ICD-10-CM

## 2016-07-06 DIAGNOSIS — O10019 Pre-existing essential hypertension complicating pregnancy, unspecified trimester: Secondary | ICD-10-CM

## 2016-07-06 DIAGNOSIS — Z8751 Personal history of pre-term labor: Secondary | ICD-10-CM

## 2016-07-06 DIAGNOSIS — O09292 Supervision of pregnancy with other poor reproductive or obstetric history, second trimester: Secondary | ICD-10-CM | POA: Insufficient documentation

## 2016-07-06 DIAGNOSIS — O99212 Obesity complicating pregnancy, second trimester: Secondary | ICD-10-CM | POA: Insufficient documentation

## 2016-07-09 ENCOUNTER — Other Ambulatory Visit (HOSPITAL_COMMUNITY): Payer: Self-pay | Admitting: *Deleted

## 2016-07-09 DIAGNOSIS — O3432 Maternal care for cervical incompetence, second trimester: Secondary | ICD-10-CM

## 2016-07-13 ENCOUNTER — Ambulatory Visit (HOSPITAL_COMMUNITY)
Admission: RE | Admit: 2016-07-13 | Discharge: 2016-07-13 | Disposition: A | Discharge status: Home / Self Care | Payer: Medicaid Other | Source: Ambulatory Visit | Attending: Obstetrics and Gynecology | Admitting: Obstetrics and Gynecology

## 2016-07-13 ENCOUNTER — Ambulatory Visit (HOSPITAL_COMMUNITY): Payer: Medicaid Other

## 2016-07-13 ENCOUNTER — Ambulatory Visit (INDEPENDENT_AMBULATORY_CARE_PROVIDER_SITE_OTHER): Payer: Medicaid Other | Admitting: *Deleted

## 2016-07-13 ENCOUNTER — Encounter (HOSPITAL_COMMUNITY): Payer: Self-pay

## 2016-07-13 ENCOUNTER — Other Ambulatory Visit (HOSPITAL_COMMUNITY): Payer: Self-pay | Admitting: *Deleted

## 2016-07-13 VITALS — BP 142/69 | HR 74 | Wt 346.8 lb

## 2016-07-13 DIAGNOSIS — Z8751 Personal history of pre-term labor: Secondary | ICD-10-CM

## 2016-07-13 DIAGNOSIS — O09212 Supervision of pregnancy with history of pre-term labor, second trimester: Secondary | ICD-10-CM | POA: Diagnosis not present

## 2016-07-13 DIAGNOSIS — O3430 Maternal care for cervical incompetence, unspecified trimester: Secondary | ICD-10-CM

## 2016-07-13 DIAGNOSIS — O09292 Supervision of pregnancy with other poor reproductive or obstetric history, second trimester: Secondary | ICD-10-CM | POA: Insufficient documentation

## 2016-07-13 DIAGNOSIS — O09522 Supervision of elderly multigravida, second trimester: Secondary | ICD-10-CM | POA: Insufficient documentation

## 2016-07-13 DIAGNOSIS — O99212 Obesity complicating pregnancy, second trimester: Secondary | ICD-10-CM | POA: Insufficient documentation

## 2016-07-13 DIAGNOSIS — Z3A20 20 weeks gestation of pregnancy: Secondary | ICD-10-CM | POA: Insufficient documentation

## 2016-07-13 DIAGNOSIS — O10012 Pre-existing essential hypertension complicating pregnancy, second trimester: Secondary | ICD-10-CM | POA: Insufficient documentation

## 2016-07-13 NOTE — Progress Notes (Signed)
Pt inquired about her referral to orthopedic surgeon and eye doctor. I reviewed pt's chart and advised her that I will check into the referral to orthopedic surgeon and see if the office has been called. Pt became upset and stated that she has asked several times and no one will help her. I assured pt that I will help however I need a little time to determine what has been done about the referral to date. I will call her tomorrow with information. Also I did not see a referral to eye doctor but will check on this as well and provide information when I call her tomorrow. Pt voiced understanding.

## 2016-07-14 ENCOUNTER — Ambulatory Visit (HOSPITAL_COMMUNITY): Payer: Medicaid Other

## 2016-07-14 ENCOUNTER — Telehealth: Payer: Self-pay | Admitting: *Deleted

## 2016-07-14 NOTE — Telephone Encounter (Addendum)
Called pt and left message stating that I am calling with information regarding the referral appointments she has requested. Please call back and ask to speak with me today.   75  Pt returned my call and I informed her of the following information: Appt has been made on 3/23 @ 0930 (arrive 0915) with Dr. Wandra Feinstein  (orthopedic) [address] [phone]. She will need to pay $250 at the time of her visit as pregnancy Medicaid will not cover the visit. She will be set up on a payment plan for any remaining balance. Pt stated she would like to do her own checking to see if she wants to see this doctor or not. She asked if she has any options such as finding the doctor she wants to see and then our office making the referral. I advised pt that we will be happy to schedule a visit for her at any office of her choosing. She stated that she will let me know later today. We then discussed the opthalmology referral. I advised that I can schedule a visit to [facility] [place] [phone] on 3/27 @ 0815 if that time works for her. The cost typically is $120-160 and since she would be considered self-pay, she would receive a 30% discount. Pt stated that she has talked with someone @ Medicaid who informed her that depending on the symptoms she is having, the visit might be covered by her pregnancy Medicaid and would need our office to obtain pre-approval. I informed pt that I can check into this process and call her back. Pt then stated that she will call back to Medicaid to get additional information regarding the referral process since she is off work today and has the time to gather the information.  She will call back later today. I confirmed understanding and will await her return call.   3/22  Pt did not call back as she had stated she would.   3/27  Pt came to office for scheduled injection appt. She discussed her situation with Lindie Spruce, RN and stated that she does not have $250 for  orthopedic appt and that she had cancelled the appt which was scheduled with Dr. Layne Benton. She provided Thayer Headings with additional information regarding the possibility of getting pre-auth from [facility] for specialist appts. Thayer Headings will follow up.

## 2016-07-20 ENCOUNTER — Ambulatory Visit (INDEPENDENT_AMBULATORY_CARE_PROVIDER_SITE_OTHER): Payer: Medicaid Other | Admitting: *Deleted

## 2016-07-20 ENCOUNTER — Ambulatory Visit (HOSPITAL_COMMUNITY)
Admission: RE | Admit: 2016-07-20 | Discharge: 2016-07-20 | Disposition: A | Discharge status: Home / Self Care | Payer: Medicaid Other | Source: Ambulatory Visit | Attending: Obstetrics and Gynecology | Admitting: Obstetrics and Gynecology

## 2016-07-20 ENCOUNTER — Encounter (HOSPITAL_COMMUNITY): Payer: Self-pay

## 2016-07-20 VITALS — BP 129/63 | HR 73

## 2016-07-20 DIAGNOSIS — Z3A21 21 weeks gestation of pregnancy: Secondary | ICD-10-CM | POA: Diagnosis not present

## 2016-07-20 DIAGNOSIS — O09522 Supervision of elderly multigravida, second trimester: Secondary | ICD-10-CM | POA: Insufficient documentation

## 2016-07-20 DIAGNOSIS — Z8751 Personal history of pre-term labor: Secondary | ICD-10-CM

## 2016-07-20 DIAGNOSIS — O09212 Supervision of pregnancy with history of pre-term labor, second trimester: Secondary | ICD-10-CM

## 2016-07-20 DIAGNOSIS — O09292 Supervision of pregnancy with other poor reproductive or obstetric history, second trimester: Secondary | ICD-10-CM | POA: Diagnosis present

## 2016-07-20 DIAGNOSIS — O99212 Obesity complicating pregnancy, second trimester: Secondary | ICD-10-CM | POA: Diagnosis not present

## 2016-07-20 DIAGNOSIS — O10012 Pre-existing essential hypertension complicating pregnancy, second trimester: Secondary | ICD-10-CM | POA: Diagnosis not present

## 2016-07-20 DIAGNOSIS — O3430 Maternal care for cervical incompetence, unspecified trimester: Secondary | ICD-10-CM

## 2016-07-20 NOTE — Progress Notes (Signed)
Patient presented to clinic for 17-P injection. Tolerated well. Patient spoke with me about her referral to orthopedics. She had an appointment on this past Fri but had to cancel due to being unable to afford the up front $250 cost. She had spoken with her Medicaid case manager who told her that a prior authorization request could be submitted by [place] and once approved medicaid would pay. She also is c/o needing glasses, having trouble seeing. She was unable to go to the mobile eye exam appointment because she already had a dental appointment. Still in need of an eye exam. I told her I would find out the process for getting prior authorization and see what I could do to get this started. Patient voiced understanding.

## 2016-07-27 ENCOUNTER — Ambulatory Visit (INDEPENDENT_AMBULATORY_CARE_PROVIDER_SITE_OTHER): Payer: Medicaid Other | Admitting: Medical

## 2016-07-27 ENCOUNTER — Other Ambulatory Visit (HOSPITAL_COMMUNITY)
Admission: RE | Admit: 2016-07-27 | Discharge: 2016-07-27 | Disposition: A | Discharge status: Home / Self Care | Payer: Medicaid Other | Source: Ambulatory Visit | Attending: Medical | Admitting: Medical

## 2016-07-27 ENCOUNTER — Ambulatory Visit (HOSPITAL_COMMUNITY)
Admission: RE | Admit: 2016-07-27 | Discharge: 2016-07-27 | Disposition: A | Discharge status: Home / Self Care | Payer: Medicaid Other | Source: Ambulatory Visit | Attending: Obstetrics and Gynecology | Admitting: Obstetrics and Gynecology

## 2016-07-27 ENCOUNTER — Ambulatory Visit (HOSPITAL_COMMUNITY): Payer: Medicaid Other

## 2016-07-27 ENCOUNTER — Encounter (HOSPITAL_COMMUNITY): Payer: Self-pay

## 2016-07-27 VITALS — BP 113/52 | HR 84 | Wt 349.8 lb

## 2016-07-27 DIAGNOSIS — Z3A22 22 weeks gestation of pregnancy: Secondary | ICD-10-CM | POA: Insufficient documentation

## 2016-07-27 DIAGNOSIS — O10912 Unspecified pre-existing hypertension complicating pregnancy, second trimester: Secondary | ICD-10-CM | POA: Insufficient documentation

## 2016-07-27 DIAGNOSIS — Z8751 Personal history of pre-term labor: Secondary | ICD-10-CM | POA: Insufficient documentation

## 2016-07-27 DIAGNOSIS — O0992 Supervision of high risk pregnancy, unspecified, second trimester: Secondary | ICD-10-CM | POA: Insufficient documentation

## 2016-07-27 DIAGNOSIS — O99212 Obesity complicating pregnancy, second trimester: Secondary | ICD-10-CM | POA: Diagnosis not present

## 2016-07-27 DIAGNOSIS — O09212 Supervision of pregnancy with history of pre-term labor, second trimester: Secondary | ICD-10-CM | POA: Insufficient documentation

## 2016-07-27 DIAGNOSIS — O09292 Supervision of pregnancy with other poor reproductive or obstetric history, second trimester: Secondary | ICD-10-CM | POA: Insufficient documentation

## 2016-07-27 DIAGNOSIS — O3430 Maternal care for cervical incompetence, unspecified trimester: Secondary | ICD-10-CM | POA: Insufficient documentation

## 2016-07-27 DIAGNOSIS — O09522 Supervision of elderly multigravida, second trimester: Secondary | ICD-10-CM | POA: Diagnosis not present

## 2016-07-27 DIAGNOSIS — O10012 Pre-existing essential hypertension complicating pregnancy, second trimester: Secondary | ICD-10-CM | POA: Diagnosis not present

## 2016-07-27 NOTE — Progress Notes (Signed)
Pt states that her medicaid requires prior auth for her to be able to seen by ortho and her eye exam. Pt states that Thayer Headings told her she needs to speak to [place] about this. I told patient that I would speak to [facility] tomorrow and followup with her. Pt most concerned with her leg pain and swelling.

## 2016-07-27 NOTE — Patient Instructions (Signed)
Second Trimester of Pregnancy The second trimester is from week 13 through week 28, month 4 through 6. This is often the time in pregnancy that you feel your best. Often times, morning sickness has lessened or quit. You may have more energy, and you may get hungry more often. Your unborn baby (fetus) is growing rapidly. At the end of the sixth month, he or she is about 9 inches long and weighs about 1 pounds. You will likely feel the baby move (quickening) between 18 and 20 weeks of pregnancy. Follow these instructions at home:  Avoid all smoking, herbs, and alcohol. Avoid drugs not approved by your doctor.  Do not use any tobacco products, including cigarettes, chewing tobacco, and electronic cigarettes. If you need help quitting, ask your doctor. You may get counseling or other support to help you quit.  Only take medicine as told by your doctor. Some medicines are safe and some are not during pregnancy.  Exercise only as told by your doctor. Stop exercising if you start having cramps.  Eat regular, healthy meals.  Wear a good support bra if your breasts are tender.  Do not use hot tubs, steam rooms, or saunas.  Wear your seat belt when driving.  Avoid raw meat, uncooked cheese, and liter boxes and soil used by cats.  Take your prenatal vitamins.  Take 1500-2000 milligrams of calcium daily starting at the 20th week of pregnancy until you deliver your baby.  Try taking medicine that helps you poop (stool softener) as needed, and if your doctor approves. Eat more fiber by eating fresh fruit, vegetables, and whole grains. Drink enough fluids to keep your pee (urine) clear or pale yellow.  Take warm water baths (sitz baths) to soothe pain or discomfort caused by hemorrhoids. Use hemorrhoid cream if your doctor approves.  If you have puffy, bulging veins (varicose veins), wear support hose. Raise (elevate) your feet for 15 minutes, 3-4 times a day. Limit salt in your diet.  Avoid heavy  lifting, wear low heals, and sit up straight.  Rest with your legs raised if you have leg cramps or low back pain.  Visit your dentist if you have not gone during your pregnancy. Use a soft toothbrush to brush your teeth. Be gentle when you floss.  You can have sex (intercourse) unless your doctor tells you not to.  Go to your doctor visits. Get help if:  You feel dizzy.  You have mild cramps or pressure in your lower belly (abdomen).  You have a nagging pain in your belly area.  You continue to feel sick to your stomach (nauseous), throw up (vomit), or have watery poop (diarrhea).  You have bad smelling fluid coming from your vagina.  You have pain with peeing (urination). Get help right away if:  You have a fever.  You are leaking fluid from your vagina.  You have spotting or bleeding from your vagina.  You have severe belly cramping or pain.  You lose or gain weight rapidly.  You have trouble catching your breath and have chest pain.  You notice sudden or extreme puffiness (swelling) of your face, hands, ankles, feet, or legs.  You have not felt the baby move in over an hour.  You have severe headaches that do not go away with medicine.  You have vision changes. This information is not intended to replace advice given to you by your health care provider. Make sure you discuss any questions you have with your health care   provider. Document Released: 07/07/2009 Document Revised: 09/18/2015 Document Reviewed: 06/13/2012 Elsevier Interactive Patient Education  2017 Elsevier Inc.  

## 2016-07-27 NOTE — Progress Notes (Signed)
   PRENATAL VISIT NOTE  Subjective:  Diana Blair is a 36 y.o. G2P0100 at [redacted]w[redacted]d being seen today for ongoing prenatal care.  She is currently monitored for the following issues for this high-risk pregnancy and has PCOS (polycystic ovarian syndrome); Health care maintenance; History of preterm delivery; History of cervical cerclage; BMI 60.0-69.9, adult ([place]); Obesity affecting pregnancy in first trimester; Chronic hypertension in obstetric context in first trimester; Supervision of high risk pregnancy in first trimester; and Vulvovaginal candidiasis on her problem list.  Patient reports vaginal discharge.  Contractions: Not present. Vag. Bleeding: None.  Movement: Present. Denies leaking of fluid.   The following portions of the patient's history were reviewed and updated as appropriate: allergies, current medications, past family history, past medical history, past social history, past surgical history and problem list. Problem list updated.  Objective:   Vitals:   07/27/16 1505  BP: (!) 113/52  Pulse: 84  Weight: (!) 349 lb 12.8 oz (158.7 kg)    Fetal Status: Fetal Heart Rate (bpm): 150   Movement: Present     General:  Alert, oriented and cooperative. Patient is in no acute distress.  Skin: Skin is warm and dry. No rash noted.   Cardiovascular: Normal heart rate noted  Respiratory: Normal respiratory effort, no problems with respiration noted  Abdomen: Soft, gravid, appropriate for gestational age. Pain/Pressure: Absent     Pelvic:  Cervical exam deferred        Extremities: Normal range of motion.  Edema: None  Mental Status: Normal mood and affect. Normal behavior. Normal judgment and thought content.   Assessment and Plan:  Pregnancy: G2P0100 at [redacted]w[redacted]d  1. History of preterm delivery - 17-P today  - MFM appointment today for cervical length, scheduled weekly  2. Supervision of high risk pregnancy in second trimester - Cervicovaginal ancillary only  3. Chronic  hypertension in obstetric context in second trimester - Normotensive today, no medications  Preterm labor symptoms and general obstetric precautions including but not limited to vaginal bleeding, contractions, leaking of fluid and fetal movement were reviewed in detail with the patient. Please refer to After Visit Summary for other counseling recommendations.  Return in about 1 week (around 08/03/2016) for 17-p.   Luvenia Redden, PA-C

## 2016-07-28 ENCOUNTER — Telehealth: Payer: Self-pay | Admitting: *Deleted

## 2016-07-28 ENCOUNTER — Telehealth: Payer: Self-pay | Admitting: General Practice

## 2016-07-28 LAB — CERVICOVAGINAL ANCILLARY ONLY
Bacterial vaginitis: NEGATIVE
CANDIDA VAGINITIS: NEGATIVE
Trichomonas: NEGATIVE

## 2016-07-28 NOTE — Telephone Encounter (Signed)
Spoke with patient regarding pre-authorization for visit to orthopedics for left knee pain.  Patient has Pregnancy Medicaid.  I tried to obtain pre-auth through [employer] and received a denial.  I also completed a paper pre-auth request and faxed to Medicaid.  Both of these are scanned in the patient's chart.  After speaking with the patient, I contacted Suzanna Obey to see if we could schedule her for an appointment with the DSS worker here at the hospital.  Cleotis Nipper called the DSS worker and explained the patient's situation.  Since the patient has no children currently, she doesn't qualify for regular Medicaid.  I have contacted patient and explained this as well as the fact that according to the DSS worker the pregnancy medicaid would only cover things related to the pregnancy.  I explained we would wait to hear back from the paper pre-auth request that I faxed today but that it would probably not be approved.  Patient states understanding.

## 2016-07-28 NOTE — Telephone Encounter (Signed)
Discussed patient referral with Cathlean Marseilles. She states that she will put in the referral in [place] to see if we can get the ortho appointment and eye exam covered by her Pregnancy Medicaid. Attempted to call patient and was unable to reach her. I left a voicemail that patient can return my call and ask to speak with me. Claiborne Billings also stated that someone will give patient and update on the referrals tomorrow before noon.

## 2016-07-28 NOTE — Telephone Encounter (Signed)
Patient returned my call. I informed her that Claiborne Billings is working on the referrals and getting them authorized. Patient is appreciative of this. Patient also requesting wet prep results. Advised patient that her results are not back yet, and I will check on them before I leave today. Patient agreeable.

## 2016-07-28 NOTE — Telephone Encounter (Signed)
Called patient, no answer- left message stating the results she has requested are still not resulted yet but should be back tomorrow if she wants to call us then or send [place] a mychart message. She may call us if she has questions

## 2016-07-29 NOTE — Telephone Encounter (Signed)
Called patient to inform her that her wet prep was negative. Patient did not answer,  left her a message to call back and ask for me.

## 2016-07-29 NOTE — Telephone Encounter (Signed)
Pt returned my call. I gave her the results of her wet prep. She has no further questions or concerns.

## 2016-08-03 ENCOUNTER — Telehealth: Payer: Self-pay | Admitting: General Practice

## 2016-08-03 ENCOUNTER — Encounter (HOSPITAL_COMMUNITY): Payer: Self-pay

## 2016-08-03 ENCOUNTER — Ambulatory Visit: Payer: Self-pay

## 2016-08-03 ENCOUNTER — Ambulatory Visit (HOSPITAL_COMMUNITY)
Admission: RE | Admit: 2016-08-03 | Discharge: 2016-08-03 | Disposition: A | Discharge status: Home / Self Care | Payer: Medicaid Other | Source: Ambulatory Visit | Attending: Obstetrics and Gynecology | Admitting: Obstetrics and Gynecology

## 2016-08-04 ENCOUNTER — Ambulatory Visit (INDEPENDENT_AMBULATORY_CARE_PROVIDER_SITE_OTHER): Payer: Medicaid Other | Admitting: General Practice

## 2016-08-04 VITALS — BP 138/66 | HR 91 | Ht 63.0 in | Wt 347.8 lb

## 2016-08-04 DIAGNOSIS — O09212 Supervision of pregnancy with history of pre-term labor, second trimester: Secondary | ICD-10-CM | POA: Diagnosis present

## 2016-08-04 DIAGNOSIS — Z8751 Personal history of pre-term labor: Secondary | ICD-10-CM

## 2016-08-06 ENCOUNTER — Encounter (HOSPITAL_COMMUNITY): Payer: Self-pay | Admitting: *Deleted

## 2016-08-06 ENCOUNTER — Inpatient Hospital Stay (HOSPITAL_COMMUNITY)
Admission: AD | Admit: 2016-08-06 | Discharge: 2016-08-06 | Disposition: A | Discharge status: Home / Self Care | Payer: Medicaid Other | Source: Ambulatory Visit | Attending: Obstetrics & Gynecology | Admitting: Obstetrics & Gynecology

## 2016-08-06 DIAGNOSIS — Z8249 Family history of ischemic heart disease and other diseases of the circulatory system: Secondary | ICD-10-CM | POA: Insufficient documentation

## 2016-08-06 DIAGNOSIS — Z79899 Other long term (current) drug therapy: Secondary | ICD-10-CM | POA: Diagnosis not present

## 2016-08-06 DIAGNOSIS — Z801 Family history of malignant neoplasm of trachea, bronchus and lung: Secondary | ICD-10-CM | POA: Diagnosis not present

## 2016-08-06 DIAGNOSIS — O99282 Endocrine, nutritional and metabolic diseases complicating pregnancy, second trimester: Secondary | ICD-10-CM | POA: Insufficient documentation

## 2016-08-06 DIAGNOSIS — R102 Pelvic and perineal pain: Secondary | ICD-10-CM | POA: Insufficient documentation

## 2016-08-06 DIAGNOSIS — Z3A23 23 weeks gestation of pregnancy: Secondary | ICD-10-CM | POA: Insufficient documentation

## 2016-08-06 DIAGNOSIS — Z87891 Personal history of nicotine dependence: Secondary | ICD-10-CM | POA: Insufficient documentation

## 2016-08-06 DIAGNOSIS — O99212 Obesity complicating pregnancy, second trimester: Secondary | ICD-10-CM | POA: Diagnosis not present

## 2016-08-06 DIAGNOSIS — Z833 Family history of diabetes mellitus: Secondary | ICD-10-CM | POA: Diagnosis not present

## 2016-08-06 DIAGNOSIS — O26892 Other specified pregnancy related conditions, second trimester: Secondary | ICD-10-CM | POA: Diagnosis not present

## 2016-08-06 DIAGNOSIS — E282 Polycystic ovarian syndrome: Secondary | ICD-10-CM | POA: Diagnosis not present

## 2016-08-06 DIAGNOSIS — Z9889 Other specified postprocedural states: Secondary | ICD-10-CM | POA: Diagnosis not present

## 2016-08-06 DIAGNOSIS — N949 Unspecified condition associated with female genital organs and menstrual cycle: Secondary | ICD-10-CM

## 2016-08-06 DIAGNOSIS — N898 Other specified noninflammatory disorders of vagina: Secondary | ICD-10-CM | POA: Diagnosis not present

## 2016-08-06 LAB — WET PREP, GENITAL
CLUE CELLS WET PREP: NONE SEEN
SPERM: NONE SEEN
TRICH WET PREP: NONE SEEN
YEAST WET PREP: NONE SEEN

## 2016-08-06 LAB — URINALYSIS, ROUTINE W REFLEX MICROSCOPIC
Bilirubin Urine: NEGATIVE
Glucose, UA: NEGATIVE mg/dL
Hgb urine dipstick: NEGATIVE
Ketones, ur: NEGATIVE mg/dL
LEUKOCYTES UA: NEGATIVE
NITRITE: NEGATIVE
Protein, ur: NEGATIVE mg/dL
SPECIFIC GRAVITY, URINE: 1.017 (ref 1.005–1.030)
pH: 6 (ref 5.0–8.0)

## 2016-08-06 NOTE — Progress Notes (Signed)
FHR located on maternal left mid quad. FHR - 150s; pt. In Right lateral position.

## 2016-08-06 NOTE — Discharge Instructions (Signed)

## 2016-08-06 NOTE — MAU Provider Note (Signed)
History     CSN: [id number]  Arrival date and time: 08/06/16 0107   None     Chief Complaint  Patient presents with  . Vaginal Pain   Next appointment 08/10/16    Vaginal Pain  The patient's pertinent negatives include no vaginal discharge. Primary symptoms comment: vaginal pain. "Sharp shooting pains in my vagina". This is a new problem. The current episode started yesterday. The problem occurs intermittently. The problem has been unchanged. Pain severity now: 4/10. She is pregnant. Associated symptoms include constipation ("but thinks this is getting better" Had a BM yesterday ). Pertinent negatives include no chills, dysuria, fever, frequency, nausea, urgency or vomiting. The vaginal discharge was normal. There has been no bleeding. Nothing aggravates the symptoms. She has tried nothing for the symptoms. Sexual activity: no intercourse in the last 24 hours, but she has been "checking down there to make sure things are ok" Menstrual history: LMP 02/24/16     Past Medical History:  Diagnosis Date  . 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 Diverticulitis   . Heart palpitations   . Morbid obesity with BMI of 50.0-59.9, adult ([facility])    BMI 58.8  . PCOS (polycystic ovarian syndrome)   . Seizure [facility])    as a child    Past Surgical History:  Procedure Laterality Date  . CERVICAL CERCLAGE  03/13/2012   Procedure: CERCLAGE CERVICAL;  Surgeon: Jonnie Kind, MD;  Location: [facility];  Service: Gynecology;  Laterality: N/A;  . CERVICAL CERCLAGE  03/17/2012   Procedure: CERCLAGE CERVICAL;  Surgeon: Osborne Oman, MD;  Location: [facility];  Service: Gynecology;  Laterality: N/A;  Removal    Family History  Problem Relation Age of Onset  . Cancer Mother     lung  . Hypertension Mother   . Diabetes Father   . Heart failure Father   . Diabetes Paternal Grandmother   . Hypertension Sister   . Hypertension Brother   . Rashes / Skin problems Sister    . Other Neg Hx     Social History  Substance Use Topics  . Smoking status: Former Smoker    Types: Cigarettes    Quit date: 02/08/2009  . Smokeless tobacco: Never Used  . Alcohol use No    Allergies: No Known Allergies  Facility-Administered Medications Prior to Admission  Medication Dose Route Frequency Provider Last Rate Last Dose  . hydroxyprogesterone caproate (MAKENA) 250 mg/mL injection 250 mg  250 mg Intramuscular Weekly Aletha Halim, MD   250 mg at 08/04/16 1445   Prescriptions Prior to Admission  Medication Sig Dispense Refill Last Dose  . clotrimazole (GYNE-LOTRIMIN) 1 % vaginal cream Place 1 Applicatorful vaginally at bedtime. (Patient not taking: Reported on 06/29/2016) 45 g 3 Not Taking  . Prenatal Multivit-Min-Fe-FA (PRENATAL VITAMINS) 0.8 MG tablet Take 1 tablet by mouth daily. 30 tablet 12 Taking    Review of Systems  Constitutional: Negative for chills and fever.  Gastrointestinal: Positive for constipation ("but thinks this is getting better" Had a BM yesterday ). Negative for nausea and vomiting.  Genitourinary: Positive for vaginal pain. Negative for dysuria, frequency, urgency, vaginal bleeding and vaginal discharge.   Physical Exam   Blood pressure 121/64, pulse 86, temperature 99.2 F (37.3 C), temperature source Oral, resp. rate 18, height 5\' 3"  (1.6 m), weight (!) 159.8 kg (352 lb 4 oz), last menstrual period 02/24/2016, SpO2 100 %.  Physical Exam  Nursing note and vitals reviewed. Constitutional:  She is oriented to person, place, and time. She appears well-developed and well-nourished. No distress.  HENT:  Head: Normocephalic.  Cardiovascular: Normal rate.   Respiratory: Effort normal.  GI: Soft. There is no tenderness. There is no rebound.  Genitourinary:  Genitourinary Comments:  External: no lesion Vagina: small amount of white discharge Cervix: pink, smooth, no CMT, closed/thick  Uterus: AGA   Neurological: She is alert and oriented to  person, place, and time.  Skin: Skin is warm and dry.  Psychiatric: She has a normal mood and affect.   FHT: 150, moderate with 10x10 accels, no decels Toco: no UCs  Results for orders placed or performed during the hospital encounter of 08/06/16 (from the past 24 hour(s))  Urinalysis, Routine w reflex microscopic     Status: None   Collection Time: 08/06/16  1:25 AM  Result Value Ref Range   Color, Urine YELLOW YELLOW   APPearance CLEAR CLEAR   Specific Gravity, Urine 1.017 1.005 - 1.030   pH 6.0 5.0 - 8.0   Glucose, UA NEGATIVE NEGATIVE mg/dL   Hgb urine dipstick NEGATIVE NEGATIVE   Bilirubin Urine NEGATIVE NEGATIVE   Ketones, ur NEGATIVE NEGATIVE mg/dL   Protein, ur NEGATIVE NEGATIVE mg/dL   Nitrite NEGATIVE NEGATIVE   Leukocytes, UA NEGATIVE NEGATIVE  Wet prep, genital     Status: Abnormal   Collection Time: 08/06/16  2:24 AM  Result Value Ref Range   Yeast Wet Prep HPF POC NONE SEEN NONE SEEN   Trich, Wet Prep NONE SEEN NONE SEEN   Clue Cells Wet Prep HPF POC NONE SEEN NONE SEEN   WBC, Wet Prep HPF POC MODERATE (A) NONE SEEN   Sperm NONE SEEN     MAU Course  Procedures  MDM  Assessment and Plan   1. Round ligament pain   2. [redacted] weeks gestation of pregnancy    DC home Comfort measures reviewed  2nd/3rd Trimester precautions  PTL precautions  Fetal kick counts RX: none  Return to MAU as needed FU with OB as planned  [place] [facility] [place] Follow up.   Specialty:  Obstetrics and Gynecology Contact information: [place], [place] 08/06/2016, 2:48 AM

## 2016-08-06 NOTE — MAU Note (Signed)
Pt. Present with c/o vaginal pain, sharp stabbing pain, denies vaginal bleeding, denies sudden gush of fluid, positive fetal movement.  RN able to locate fetal heart rate via external monitor with patient laying in right lateral position, FHR located in left mid quad. No contractions noted. Last sexual encounter was "conception". EFM applied, toco applied.

## 2016-08-06 NOTE — MAU Note (Signed)
PT  SAYS SHE  CALLED  NURSE  LINE  -  TOLD  TO COME  IN -  FEELS  CONSTIPATED-   .   HAS HAD GAS ALL DAY,    HAS PAIN  OVER ALL ABD.    VOIDS OK.   BABY  DOESN'T MOVE  THE SAME .    FEELS  STABBING PAIN IN VAG.      GETS  17-P  INJ   -  YESTERDAY.      LAST SEX-    WHEN GOT PREG- ON  PELVIC  REST.   HRC-  FOR  PRETERM   LABOR.

## 2016-08-09 LAB — GC/CHLAMYDIA PROBE AMP (~~LOC~~) NOT AT ARMC
CHLAMYDIA, DNA PROBE: NEGATIVE
Neisseria Gonorrhea: NEGATIVE

## 2016-08-10 ENCOUNTER — Ambulatory Visit (INDEPENDENT_AMBULATORY_CARE_PROVIDER_SITE_OTHER): Payer: Medicaid Other

## 2016-08-10 ENCOUNTER — Other Ambulatory Visit (HOSPITAL_COMMUNITY): Payer: Self-pay | Admitting: Obstetrics and Gynecology

## 2016-08-10 ENCOUNTER — Encounter (HOSPITAL_COMMUNITY): Payer: Self-pay

## 2016-08-10 ENCOUNTER — Ambulatory Visit (HOSPITAL_COMMUNITY)
Admission: RE | Admit: 2016-08-10 | Discharge: 2016-08-10 | Disposition: A | Discharge status: Home / Self Care | Payer: Medicaid Other | Source: Ambulatory Visit | Attending: Obstetrics and Gynecology | Admitting: Obstetrics and Gynecology

## 2016-08-10 ENCOUNTER — Ambulatory Visit (HOSPITAL_COMMUNITY): Payer: Medicaid Other

## 2016-08-10 ENCOUNTER — Other Ambulatory Visit (HOSPITAL_COMMUNITY): Payer: Self-pay | Admitting: Maternal and Fetal Medicine

## 2016-08-10 DIAGNOSIS — O09522 Supervision of elderly multigravida, second trimester: Secondary | ICD-10-CM | POA: Diagnosis not present

## 2016-08-10 DIAGNOSIS — O09219 Supervision of pregnancy with history of pre-term labor, unspecified trimester: Secondary | ICD-10-CM

## 2016-08-10 DIAGNOSIS — Z8751 Personal history of pre-term labor: Secondary | ICD-10-CM

## 2016-08-10 DIAGNOSIS — O99212 Obesity complicating pregnancy, second trimester: Secondary | ICD-10-CM

## 2016-08-10 DIAGNOSIS — O09292 Supervision of pregnancy with other poor reproductive or obstetric history, second trimester: Secondary | ICD-10-CM | POA: Diagnosis not present

## 2016-08-10 DIAGNOSIS — Z3A24 24 weeks gestation of pregnancy: Secondary | ICD-10-CM | POA: Diagnosis not present

## 2016-08-10 DIAGNOSIS — O09299 Supervision of pregnancy with other poor reproductive or obstetric history, unspecified trimester: Secondary | ICD-10-CM

## 2016-08-10 DIAGNOSIS — O10012 Pre-existing essential hypertension complicating pregnancy, second trimester: Secondary | ICD-10-CM | POA: Diagnosis not present

## 2016-08-10 DIAGNOSIS — O3432 Maternal care for cervical incompetence, second trimester: Secondary | ICD-10-CM

## 2016-08-10 DIAGNOSIS — O3430 Maternal care for cervical incompetence, unspecified trimester: Secondary | ICD-10-CM

## 2016-08-10 DIAGNOSIS — O09212 Supervision of pregnancy with history of pre-term labor, second trimester: Secondary | ICD-10-CM | POA: Insufficient documentation

## 2016-08-10 DIAGNOSIS — O09899 Supervision of other high risk pregnancies, unspecified trimester: Secondary | ICD-10-CM

## 2016-08-10 NOTE — Progress Notes (Signed)
Patient presented to office today for 17-p injection. Patient tolerated well and will follow up at next office visit.

## 2016-08-17 ENCOUNTER — Other Ambulatory Visit (HOSPITAL_COMMUNITY): Payer: Self-pay | Admitting: *Deleted

## 2016-08-17 ENCOUNTER — Ambulatory Visit (INDEPENDENT_AMBULATORY_CARE_PROVIDER_SITE_OTHER): Payer: Medicaid Other | Admitting: *Deleted

## 2016-08-17 ENCOUNTER — Other Ambulatory Visit (HOSPITAL_COMMUNITY): Payer: Self-pay | Admitting: Obstetrics and Gynecology

## 2016-08-17 ENCOUNTER — Ambulatory Visit (HOSPITAL_COMMUNITY)
Admission: RE | Admit: 2016-08-17 | Discharge: 2016-08-17 | Disposition: A | Discharge status: Home / Self Care | Payer: Medicaid Other | Source: Ambulatory Visit | Attending: Obstetrics and Gynecology | Admitting: Obstetrics and Gynecology

## 2016-08-17 ENCOUNTER — Encounter (HOSPITAL_COMMUNITY): Payer: Self-pay

## 2016-08-17 VITALS — BP 110/52 | HR 88 | Wt 351.4 lb

## 2016-08-17 DIAGNOSIS — O99210 Obesity complicating pregnancy, unspecified trimester: Secondary | ICD-10-CM

## 2016-08-17 DIAGNOSIS — O3430 Maternal care for cervical incompetence, unspecified trimester: Secondary | ICD-10-CM

## 2016-08-17 DIAGNOSIS — O10012 Pre-existing essential hypertension complicating pregnancy, second trimester: Secondary | ICD-10-CM | POA: Diagnosis not present

## 2016-08-17 DIAGNOSIS — O09522 Supervision of elderly multigravida, second trimester: Secondary | ICD-10-CM | POA: Insufficient documentation

## 2016-08-17 DIAGNOSIS — O09212 Supervision of pregnancy with history of pre-term labor, second trimester: Secondary | ICD-10-CM

## 2016-08-17 DIAGNOSIS — O10912 Unspecified pre-existing hypertension complicating pregnancy, second trimester: Secondary | ICD-10-CM

## 2016-08-17 DIAGNOSIS — O99212 Obesity complicating pregnancy, second trimester: Secondary | ICD-10-CM | POA: Diagnosis not present

## 2016-08-17 DIAGNOSIS — O09292 Supervision of pregnancy with other poor reproductive or obstetric history, second trimester: Secondary | ICD-10-CM | POA: Insufficient documentation

## 2016-08-17 DIAGNOSIS — O365990 Maternal care for other known or suspected poor fetal growth, unspecified trimester, not applicable or unspecified: Secondary | ICD-10-CM

## 2016-08-17 DIAGNOSIS — Z3A25 25 weeks gestation of pregnancy: Secondary | ICD-10-CM | POA: Insufficient documentation

## 2016-08-17 DIAGNOSIS — Z8751 Personal history of pre-term labor: Secondary | ICD-10-CM

## 2016-08-17 NOTE — Progress Notes (Signed)
17P injection given IM as scheduled. Patient tolerated well.

## 2016-08-24 ENCOUNTER — Ambulatory Visit: Payer: Self-pay

## 2016-08-24 ENCOUNTER — Ambulatory Visit (HOSPITAL_COMMUNITY)
Admission: RE | Admit: 2016-08-24 | Discharge: 2016-08-24 | Disposition: A | Discharge status: Home / Self Care | Payer: Medicaid Other | Source: Ambulatory Visit | Attending: Obstetrics and Gynecology | Admitting: Obstetrics and Gynecology

## 2016-08-24 ENCOUNTER — Ambulatory Visit (HOSPITAL_COMMUNITY): Payer: Medicaid Other

## 2016-08-24 ENCOUNTER — Encounter (HOSPITAL_COMMUNITY): Payer: Self-pay

## 2016-08-24 ENCOUNTER — Ambulatory Visit (INDEPENDENT_AMBULATORY_CARE_PROVIDER_SITE_OTHER): Payer: Medicaid Other | Admitting: Obstetrics and Gynecology

## 2016-08-24 VITALS — BP 127/74 | HR 85 | Wt 349.1 lb

## 2016-08-24 DIAGNOSIS — O0991 Supervision of high risk pregnancy, unspecified, first trimester: Secondary | ICD-10-CM | POA: Diagnosis not present

## 2016-08-24 DIAGNOSIS — O09292 Supervision of pregnancy with other poor reproductive or obstetric history, second trimester: Secondary | ICD-10-CM | POA: Diagnosis not present

## 2016-08-24 DIAGNOSIS — Z9889 Other specified postprocedural states: Secondary | ICD-10-CM

## 2016-08-24 DIAGNOSIS — O10012 Pre-existing essential hypertension complicating pregnancy, second trimester: Secondary | ICD-10-CM | POA: Insufficient documentation

## 2016-08-24 DIAGNOSIS — Z8751 Personal history of pre-term labor: Secondary | ICD-10-CM

## 2016-08-24 DIAGNOSIS — O99212 Obesity complicating pregnancy, second trimester: Secondary | ICD-10-CM | POA: Diagnosis not present

## 2016-08-24 DIAGNOSIS — Z3A26 26 weeks gestation of pregnancy: Secondary | ICD-10-CM | POA: Diagnosis not present

## 2016-08-24 DIAGNOSIS — O09212 Supervision of pregnancy with history of pre-term labor, second trimester: Secondary | ICD-10-CM | POA: Insufficient documentation

## 2016-08-24 DIAGNOSIS — K5900 Constipation, unspecified: Secondary | ICD-10-CM

## 2016-08-24 DIAGNOSIS — O09522 Supervision of elderly multigravida, second trimester: Secondary | ICD-10-CM | POA: Diagnosis not present

## 2016-08-24 DIAGNOSIS — O3432 Maternal care for cervical incompetence, second trimester: Secondary | ICD-10-CM | POA: Insufficient documentation

## 2016-08-24 DIAGNOSIS — O3430 Maternal care for cervical incompetence, unspecified trimester: Secondary | ICD-10-CM

## 2016-08-24 DIAGNOSIS — O10911 Unspecified pre-existing hypertension complicating pregnancy, first trimester: Secondary | ICD-10-CM

## 2016-08-24 LAB — POCT URINALYSIS DIP (DEVICE)
GLUCOSE, UA: NEGATIVE mg/dL
KETONES UR: NEGATIVE mg/dL
LEUKOCYTES UA: NEGATIVE
Nitrite: NEGATIVE
Protein, ur: 30 mg/dL — AB
SPECIFIC GRAVITY, URINE: 1.025 (ref 1.005–1.030)
Urobilinogen, UA: 0.2 mg/dL (ref 0.0–1.0)
pH: 6 (ref 5.0–8.0)

## 2016-08-24 MED ORDER — BETAMETHASONE SOD PHOS & ACET 6 (3-3) MG/ML IJ SUSP
12.0000 mg | Freq: Once | INTRAMUSCULAR | Status: DC
  Filled 2016-08-24: qty 2

## 2016-08-24 MED ORDER — DOCUSATE SODIUM 100 MG PO CAPS: 100.0000 mg | ORAL_CAPSULE | Freq: Two times a day (BID) | ORAL | 1 refills | Status: DC

## 2016-08-24 NOTE — ED Notes (Signed)
First BMZ injection given IM in rt upper glute.  Pt tolerated well and advised to go to MAU on 08-25-16 for second injection.

## 2016-08-24 NOTE — Patient Instructions (Signed)

## 2016-08-24 NOTE — Progress Notes (Signed)
c/o constipation sometimes-states has pain when she gets constipated but had 2 bm's today and feels better. Takes metamucial sometimes and increases fluids.

## 2016-08-25 ENCOUNTER — Inpatient Hospital Stay (HOSPITAL_COMMUNITY)
Admission: AD | Admit: 2016-08-25 | Discharge: 2016-08-25 | Disposition: A | Discharge status: Home / Self Care | Payer: Medicaid Other | Source: Ambulatory Visit | Attending: Obstetrics & Gynecology | Admitting: Obstetrics & Gynecology

## 2016-08-25 DIAGNOSIS — Z3A00 Weeks of gestation of pregnancy not specified: Secondary | ICD-10-CM | POA: Diagnosis not present

## 2016-08-25 DIAGNOSIS — Z3490 Encounter for supervision of normal pregnancy, unspecified, unspecified trimester: Secondary | ICD-10-CM | POA: Diagnosis present

## 2016-08-25 MED ORDER — BETAMETHASONE SOD PHOS & ACET 6 (3-3) MG/ML IJ SUSP
12.0000 mg | Freq: Once | INTRAMUSCULAR | Status: AC
  Administered 2016-08-25: 12 mg via INTRAMUSCULAR
  Filled 2016-08-25: qty 2

## 2016-08-25 NOTE — Progress Notes (Signed)
   PRENATAL VISIT NOTE  Subjective:  Diana Blair is a 36 y.o. G2P0100 at [redacted]w[redacted]d being seen today for ongoing prenatal care.  She is currently monitored for the following issues for this high-risk pregnancy and has PCOS (polycystic ovarian syndrome); Health care maintenance; History of preterm delivery; History of cervical cerclage; BMI 60.0-69.9, adult ([place]); Obesity affecting pregnancy in first trimester; Chronic hypertension in obstetric context in first trimester; and Supervision of high risk pregnancy in first trimester on her problem list.  Patient reports no complaints.  Contractions: Not present. Vag. Bleeding: None.  Movement: Present. Denies leaking of fluid.   The following portions of the patient's history were reviewed and updated as appropriate: allergies, current medications, past family history, past medical history, past social history, past surgical history and problem list. Problem list updated.  Objective:   Vitals:   08/24/16 1453  BP: 127/74  Pulse: 85  Weight: (!) 349 lb 1.6 oz (158.4 kg)    Fetal Status: Fetal Heart Rate (bpm): 136   Movement: Present     General:  Alert, oriented and cooperative. Patient is in no acute distress.  Skin: Skin is warm and dry. No rash noted.   Cardiovascular: Normal heart rate noted  Respiratory: Normal respiratory effort, no problems with respiration noted  Abdomen: Soft, gravid, appropriate for gestational age. Pain/Pressure: Present     Pelvic:  Cervical exam deferred        Extremities: Normal range of motion.  Edema: Trace  Mental Status: Normal mood and affect. Normal behavior. Normal judgment and thought content.   Assessment and Plan:  Pregnancy: G2P0100 at [redacted]w[redacted]d  1. History of preterm delivery -continue makena -serial cervical lengths ordered, next today -shortened to 2.3cm start vaginal progesterone and BMZ given.  - docusate sodium (COLACE) 100 MG capsule; Take 1 capsule (100 mg total) by mouth 2 (two) times  daily.  Dispense: 30 capsule; Refill: 1  2. History of cervical cerclage Not placed this pregnancy - not indicated now  3. Chronic hypertension in obstetric context in first trimester BP controlled at this time.  continue to monitor Antenatal testing - docusate sodium (COLACE) 100 MG capsule; Take 1 capsule (100 mg total) by mouth 2 (two) times daily.  Dispense: 30 capsule; Refill: 1  5. Constipation, unspecified constipation type - docusate sodium (COLACE) 100 MG capsule; Take 1 capsule (100 mg total) by mouth 2 (two) times daily.  Dispense: 30 capsule; Refill: 1  Preterm labor symptoms and general obstetric precautions including but not limited to vaginal bleeding, contractions, leaking of fluid and fetal movement were reviewed in detail with the patient. Please refer to After Visit Summary for other counseling recommendations.  Return in about 4 weeks (around 09/21/2016) for weekly 17P, needs early am appt for fasting 2hrgtt in the net 2 weeks.   Waldemar Dickens, MD

## 2016-08-25 NOTE — MAU Note (Signed)
No complaints, did ok with injection yesterday.  Denies bleeding or leaking, has a rare cramp, nothing regular, no changes.

## 2016-08-26 ENCOUNTER — Encounter: Payer: Self-pay | Admitting: Obstetrics & Gynecology

## 2016-08-31 ENCOUNTER — Other Ambulatory Visit (INDEPENDENT_AMBULATORY_CARE_PROVIDER_SITE_OTHER): Payer: Medicaid Other

## 2016-08-31 ENCOUNTER — Ambulatory Visit: Payer: Self-pay

## 2016-08-31 ENCOUNTER — Encounter (HOSPITAL_COMMUNITY): Payer: Self-pay

## 2016-08-31 ENCOUNTER — Ambulatory Visit (HOSPITAL_COMMUNITY)
Admission: RE | Admit: 2016-08-31 | Discharge: 2016-08-31 | Disposition: A | Discharge status: Home / Self Care | Payer: Medicaid Other | Source: Ambulatory Visit | Attending: Obstetrics and Gynecology | Admitting: Obstetrics and Gynecology

## 2016-08-31 ENCOUNTER — Ambulatory Visit (INDEPENDENT_AMBULATORY_CARE_PROVIDER_SITE_OTHER): Payer: Medicaid Other | Admitting: Clinical

## 2016-08-31 ENCOUNTER — Other Ambulatory Visit (HOSPITAL_COMMUNITY): Payer: Self-pay | Admitting: Obstetrics and Gynecology

## 2016-08-31 VITALS — BP 123/71 | HR 72

## 2016-08-31 DIAGNOSIS — O99212 Obesity complicating pregnancy, second trimester: Secondary | ICD-10-CM

## 2016-08-31 DIAGNOSIS — O09219 Supervision of pregnancy with history of pre-term labor, unspecified trimester: Secondary | ICD-10-CM

## 2016-08-31 DIAGNOSIS — O3430 Maternal care for cervical incompetence, unspecified trimester: Secondary | ICD-10-CM

## 2016-08-31 DIAGNOSIS — Z3A27 27 weeks gestation of pregnancy: Secondary | ICD-10-CM | POA: Diagnosis not present

## 2016-08-31 DIAGNOSIS — O09522 Supervision of elderly multigravida, second trimester: Secondary | ICD-10-CM | POA: Diagnosis present

## 2016-08-31 DIAGNOSIS — O09212 Supervision of pregnancy with history of pre-term labor, second trimester: Secondary | ICD-10-CM | POA: Diagnosis not present

## 2016-08-31 DIAGNOSIS — O09292 Supervision of pregnancy with other poor reproductive or obstetric history, second trimester: Secondary | ICD-10-CM | POA: Diagnosis not present

## 2016-08-31 DIAGNOSIS — F4322 Adjustment disorder with anxiety: Secondary | ICD-10-CM | POA: Diagnosis not present

## 2016-08-31 DIAGNOSIS — O10012 Pre-existing essential hypertension complicating pregnancy, second trimester: Secondary | ICD-10-CM

## 2016-08-31 DIAGNOSIS — O09899 Supervision of other high risk pregnancies, unspecified trimester: Secondary | ICD-10-CM

## 2016-08-31 DIAGNOSIS — Z3689 Encounter for other specified antenatal screening: Secondary | ICD-10-CM | POA: Diagnosis present

## 2016-08-31 DIAGNOSIS — O10919 Unspecified pre-existing hypertension complicating pregnancy, unspecified trimester: Secondary | ICD-10-CM

## 2016-08-31 NOTE — BH Specialist Note (Signed)
Integrated Behavioral Health Follow Up Visit  MRN: [id number] Name: Diana Blair   Session Start time: 9:10 Session End time: 10:10 Total time: 1 hour Number of Integrated Behavioral Health Clinician visits: 2/10  Type of Service: [place] Interpretor:No. Interpretor Name and Language: n/a   Warm Hand Off Completed.       SUBJECTIVE: Diana Blair is a 36 y.o. female accompanied by patient. Patient was referred by f/u Dr Ilda Basset for psychosocial stress. Patient reports the following symptoms/concerns: Pt states her primary concern today is housing insecurity and need to talk about impact of father's passing two weeks ago has on her evaluation of current and future life. Duration of problem: Two weeks; Severity of problem: mild  OBJECTIVE: Mood: Appropriate and Affect: Appropriate Risk of harm to self or others: No plan to harm self or others   LIFE CONTEXT: Family and Social: Lives with husband and mother-in-law; looking for new housing, father passed away(they were not close) School/Work: Working part-time at [employer]; planning to take online classes postpartum Self-Care: Mindful of food choices on her health; wants to put together a living will for herself postpartum, for greater control of end-of-life than her parents had Life Changes: Current pregnancy, recent passing of father  GOALS ADDRESSED: Patient will reduce symptoms of: anxiety and stress and increase knowledge and/or ability of: stress reduction and also: Increase healthy adjustment to current life circumstances  INTERVENTIONS: Solution-Focused Strategies Standardized Assessments completed: none completed today  ASSESSMENT: Patient currently experiencing Adjustment disorder with anxious mood. Patient may benefit from brief therapeutic interventions regarding continued coping with symptoms of anxiety.  PLAN: 1. Follow up with behavioral health clinician on : One  month 2. Behavioral recommendations:  -Go back to [employer] within one week, to obtain new housing list -Continue researching online school options, to prepare for future plans -Continue being mindful of healthy food choices, and consider small daily changes to implement at next visit 3. Referral(s): Integrated [employer] [facility] (In Clinic)  Caroleen Hamman [place], [place]  Depression screen [facility] 2/9 08/24/2016 07/27/2016 06/29/2016 05/19/2016 05/04/2016  Decreased Interest 0 0 0 0 0  Down, Depressed, Hopeless 1 0 0 0 1  PHQ - 2 Score 1 0 0 0 1  Altered sleeping 0 0 1 1 1   Tired, decreased energy 1 0 1 1 2   Change in appetite 1 0 1 1 1   Feeling bad or failure about yourself  0 0 0 0 0  Trouble concentrating 0 0 0 0 0  Moving slowly or fidgety/restless 0 0 0 0 0  Suicidal thoughts 0 0 0 0 0  PHQ-9 Score 3 0 3 3 5    GAD 7 : Generalized Anxiety Score 08/24/2016 07/27/2016 06/29/2016 05/19/2016  Nervous, Anxious, on Edge 1 1 1 1   Control/stop worrying 0 0 1 1  Worry too much - different things 0 0 0 0  Trouble relaxing 0 0 0 1  Restless 0 0 0 0  Easily annoyed or irritable 1 0 1 1  Afraid - awful might happen 0 1 0 1  Total GAD 7 Score 2 2 3  5

## 2016-08-31 NOTE — Progress Notes (Unsigned)
17-P GIVEN TODAY IN OUR OFFICE

## 2016-09-01 LAB — CBC
Hematocrit: 36.3 % (ref 34.0–46.6)
Hemoglobin: 11.8 g/dL (ref 11.1–15.9)
MCH: 27.1 pg (ref 26.6–33.0)
MCHC: 32.5 g/dL (ref 31.5–35.7)
MCV: 83 fL (ref 79–97)
PLATELETS: 185 10*3/uL (ref 150–379)
RBC: 4.36 x10E6/uL (ref 3.77–5.28)
RDW: 16.7 % — ABNORMAL HIGH (ref 12.3–15.4)
WBC: 9.5 10*3/uL (ref 3.4–10.8)

## 2016-09-01 LAB — GLUCOSE TOLERANCE, 2 HOURS W/ 1HR
GLUCOSE, 1 HOUR: 94 mg/dL (ref 65–179)
GLUCOSE, 2 HOUR: 119 mg/dL (ref 65–152)
Glucose, Fasting: 88 mg/dL (ref 65–91)

## 2016-09-01 LAB — RPR: RPR: NONREACTIVE

## 2016-09-01 LAB — HIV ANTIBODY (ROUTINE TESTING W REFLEX): HIV SCREEN 4TH GENERATION: NONREACTIVE

## 2016-09-07 ENCOUNTER — Encounter (HOSPITAL_COMMUNITY): Payer: Self-pay

## 2016-09-07 ENCOUNTER — Ambulatory Visit (HOSPITAL_COMMUNITY)
Admission: RE | Admit: 2016-09-07 | Discharge: 2016-09-07 | Disposition: A | Discharge status: Home / Self Care | Payer: Medicaid Other | Source: Ambulatory Visit | Attending: Obstetrics and Gynecology | Admitting: Obstetrics and Gynecology

## 2016-09-07 ENCOUNTER — Ambulatory Visit (INDEPENDENT_AMBULATORY_CARE_PROVIDER_SITE_OTHER): Payer: Medicaid Other

## 2016-09-07 DIAGNOSIS — Z3A28 28 weeks gestation of pregnancy: Secondary | ICD-10-CM | POA: Diagnosis not present

## 2016-09-07 DIAGNOSIS — O365990 Maternal care for other known or suspected poor fetal growth, unspecified trimester, not applicable or unspecified: Secondary | ICD-10-CM | POA: Insufficient documentation

## 2016-09-07 DIAGNOSIS — O09523 Supervision of elderly multigravida, third trimester: Secondary | ICD-10-CM | POA: Insufficient documentation

## 2016-09-07 DIAGNOSIS — O99213 Obesity complicating pregnancy, third trimester: Secondary | ICD-10-CM | POA: Diagnosis present

## 2016-09-07 DIAGNOSIS — Z8751 Personal history of pre-term labor: Secondary | ICD-10-CM

## 2016-09-07 DIAGNOSIS — O09293 Supervision of pregnancy with other poor reproductive or obstetric history, third trimester: Secondary | ICD-10-CM | POA: Insufficient documentation

## 2016-09-07 DIAGNOSIS — O10013 Pre-existing essential hypertension complicating pregnancy, third trimester: Secondary | ICD-10-CM | POA: Insufficient documentation

## 2016-09-07 DIAGNOSIS — O09213 Supervision of pregnancy with history of pre-term labor, third trimester: Secondary | ICD-10-CM | POA: Diagnosis present

## 2016-09-08 ENCOUNTER — Other Ambulatory Visit (HOSPITAL_COMMUNITY): Payer: Self-pay | Admitting: *Deleted

## 2016-09-08 DIAGNOSIS — O09529 Supervision of elderly multigravida, unspecified trimester: Secondary | ICD-10-CM

## 2016-09-14 ENCOUNTER — Ambulatory Visit (INDEPENDENT_AMBULATORY_CARE_PROVIDER_SITE_OTHER): Payer: Medicaid Other

## 2016-09-14 VITALS — BP 132/68 | HR 89 | Wt 349.8 lb

## 2016-09-14 DIAGNOSIS — O368190 Decreased fetal movements, unspecified trimester, not applicable or unspecified: Secondary | ICD-10-CM | POA: Diagnosis not present

## 2016-09-14 DIAGNOSIS — O09212 Supervision of pregnancy with history of pre-term labor, second trimester: Secondary | ICD-10-CM

## 2016-09-14 NOTE — Addendum Note (Signed)
Addended by: Samuel Germany on: 09/14/2016 05:30 PM   Modules accepted: Level of Service

## 2016-09-14 NOTE — Progress Notes (Signed)
NST reactive, reviewed by Dr Ihor Dow

## 2016-09-14 NOTE — Progress Notes (Signed)
Pt here today for 17p injection.  Pt reports that she has not felt the baby move as much as she has in past few days.  Pt states that she just has not been feeling the best; pt could not explain why she felt bad but "I just feel bad".  Notified Dr. Ihor Dow, recommendation to have a NST.  Pt agreed provider's recommendation.

## 2016-09-21 ENCOUNTER — Ambulatory Visit (INDEPENDENT_AMBULATORY_CARE_PROVIDER_SITE_OTHER): Payer: Medicaid Other | Admitting: Obstetrics and Gynecology

## 2016-09-21 VITALS — BP 124/83 | HR 104 | Wt 355.0 lb

## 2016-09-21 DIAGNOSIS — O10911 Unspecified pre-existing hypertension complicating pregnancy, first trimester: Secondary | ICD-10-CM

## 2016-09-21 DIAGNOSIS — O09213 Supervision of pregnancy with history of pre-term labor, third trimester: Secondary | ICD-10-CM | POA: Diagnosis not present

## 2016-09-21 DIAGNOSIS — O99211 Obesity complicating pregnancy, first trimester: Secondary | ICD-10-CM

## 2016-09-21 DIAGNOSIS — O0993 Supervision of high risk pregnancy, unspecified, third trimester: Secondary | ICD-10-CM | POA: Diagnosis present

## 2016-09-21 DIAGNOSIS — Z8751 Personal history of pre-term labor: Secondary | ICD-10-CM

## 2016-09-21 DIAGNOSIS — O99213 Obesity complicating pregnancy, third trimester: Secondary | ICD-10-CM

## 2016-09-21 DIAGNOSIS — O0991 Supervision of high risk pregnancy, unspecified, first trimester: Secondary | ICD-10-CM

## 2016-09-21 DIAGNOSIS — O10913 Unspecified pre-existing hypertension complicating pregnancy, third trimester: Secondary | ICD-10-CM | POA: Diagnosis not present

## 2016-09-21 NOTE — Progress Notes (Signed)
Patient reports headache since last night. Denies blurry vision or dizziness.

## 2016-09-21 NOTE — Patient Instructions (Signed)
AREA PEDIATRIC/FAMILY PRACTICE PHYSICIANS   [facility] [address], [address] [place], [place]  [address] Phone - [phone]   Fax - [phone]  [facility] [address] [place], [place] [address] Phone - [phone]   Fax - [phone]  [facility] [address] [place], [place]  [address] Phone - [phone]   Fax - [phone]  [facility] [address] [place], [place]  [address] Phone - [phone]   Fax - [phone]  [facility] [address] [place], [place]  [address] Phone - [phone]   Fax - [phone]  [facility] [address] [place], [place]  [address] Phone - [phone]   Fax - [phone]  [facility] [address] [place], [place]  [address] Phone - [phone]   Fax - [phone]  [facility] [address] [place], [place]  [address] Phone - [phone]   Fax - [phone]  [facility] [address] [place], [place]  [address] Phone - [phone]   Fax - [phone] [facility] [address] [place], [place]  [address] Phone - [phone]   Fax - [phone]  [facility] [address] [place], [place]  [address] Phone - [phone]   Fax - [phone]  [facility] [address] [place], [place]  [address] Phone - [phone]   Fax - [phone]  [facility] [address] [place], [place]  [address] Phone - [phone]   Fax - [phone]  [facility] [address] [place], [place]  [address] Phone - [phone]  [facility] [address] [place], [place]  [address] Phone - [phone]   Fax - [phone]  [facility] [address] [place], [place]  [address] Phone - [phone]   Fax - [phone]  [facility] [address] [place], [place]  [address] Phone - [phone]   Fax - [phone]  [facility] [address] [place], [place]  [address] Phone - [phone]   Fax - [phone]   [facility] [address] [place], [place]  [address] Phone - [phone]   Fax - [phone]  [facility] [address] [place], [place]  [address] Phone - [phone]   Fax - [phone]  [facility] [address] , [place]  [address] Phone - [phone]   Fax - [phone]  [facility] [address] , [place]  [address] Phone - [phone]   Fax - [phone]  [facility] [address] , [place]  [address] Phone - [phone]   Fax - [phone] [facility] [address] [place],   [address] Phone - [phone]   Fax - [phone]  [facility] [address] [place],   [address] Phone - [phone]   Fax - [phone]  [facility] [address] [place],   [address] Phone - [phone]   Fax - [phone]  [facility] Charlene Flemming MD [address]   [address] Phone [phone]  Fax [phone]   

## 2016-09-21 NOTE — Progress Notes (Signed)
   PRENATAL VISIT NOTE  Subjective:  Diana Blair is a 36 y.o. G2P0100 at [redacted]w[redacted]d being seen today for ongoing prenatal care.  She is currently monitored for the following issues for this high-risk pregnancy and has PCOS (polycystic ovarian syndrome); Health care maintenance; History of preterm delivery; History of cervical cerclage; BMI 60.0-69.9, adult ([place]); Obesity affecting pregnancy in first trimester; Chronic hypertension in obstetric context in first trimester; and Supervision of high risk pregnancy in first trimester on her problem list.  Patient reports no complaints.  Contractions: Not present. Vag. Bleeding: None.  Movement: Present. Denies leaking of fluid.   The following portions of the patient's history were reviewed and updated as appropriate: allergies, current medications, past family history, past medical history, past social history, past surgical history and problem list. Problem list updated.  Objective:   Vitals:   09/21/16 1518  BP: 124/83  Pulse: (!) 104  Weight: (!) 355 lb (161 kg)    Fetal Status: Fetal Heart Rate (bpm): 145 Fundal Height: 30 cm Movement: Present     General:  Alert, oriented and cooperative. Patient is in no acute distress.  Skin: Skin is warm and dry. No rash noted.   Cardiovascular: Normal heart rate noted  Respiratory: Normal respiratory effort, no problems with respiration noted  Abdomen: Soft, gravid, appropriate for gestational age. Pain/Pressure: Absent     Pelvic:  Cervical exam deferred        Extremities: Normal range of motion.  Edema: Trace  Mental Status: Normal mood and affect. Normal behavior. Normal judgment and thought content.   Assessment and Plan:  Pregnancy: G2P0100 at [redacted]w[redacted]d  1. Supervision of high risk pregnancy in first trimester Patient is doing well without complaints  2. Obesity affecting pregnancy in first trimester   3. Chronic hypertension in obstetric context in first trimester Chart reviewed because  patient questioned the origin of this diagnosis. Upon chart review, only elevated BP was noted on 05/02/2016 during an MAU visit with BP as high as 146/62. No further elevated BP since that visit. Patient desires to be treated as a CHTN with fetal monitoring and growth ultrasound. Normal growth and anatomy on 5/15 Follow up growth scheduled on 6/12 Will start fetal testing at next visit  4. History of preterm delivery Continue weekly 17-P  Preterm labor symptoms and general obstetric precautions including but not limited to vaginal bleeding, contractions, leaking of fluid and fetal movement were reviewed in detail with the patient. Please refer to After Visit Summary for other counseling recommendations.  Return in about 2 weeks (around 10/05/2016) for ROB, NST.   Mora Bellman, MD

## 2016-09-28 ENCOUNTER — Ambulatory Visit (INDEPENDENT_AMBULATORY_CARE_PROVIDER_SITE_OTHER): Payer: Medicaid Other | Admitting: Clinical

## 2016-09-28 ENCOUNTER — Ambulatory Visit (INDEPENDENT_AMBULATORY_CARE_PROVIDER_SITE_OTHER): Payer: Medicaid Other | Admitting: *Deleted

## 2016-09-28 VITALS — BP 139/56 | HR 82 | Wt 351.1 lb

## 2016-09-28 DIAGNOSIS — Z8751 Personal history of pre-term labor: Secondary | ICD-10-CM

## 2016-09-28 DIAGNOSIS — O09213 Supervision of pregnancy with history of pre-term labor, third trimester: Secondary | ICD-10-CM

## 2016-09-28 DIAGNOSIS — F4322 Adjustment disorder with anxiety: Secondary | ICD-10-CM

## 2016-09-28 NOTE — BH Specialist Note (Signed)
Integrated Behavioral Health Follow Up Visit  MRN: [id number] Name: Diana Blair   Session Start time: 4:00 Session End time: 5:00 Total time: 1 hour Number of Integrated Behavioral Health Clinician visits: 3/10  Type of Service: [place] Interpretor:No. Interpretor Name and Language: n/a   Warm Hand Off Completed.       SUBJECTIVE: Diana Blair is a 36 y.o. female accompanied by patient. Patient was referred by f/u Dr. Ilda Basset for psychosocial stress. Patient reports the following symptoms/concerns: Pt states her primary concern today is worry over feeling unprepared for baby, and the uncertainties of upcoming new motherhood, along with financial and relationship changes; feels that being able to talk aloud her worries will help to cope. 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 Patient may benefit from continued brief therapeutic interventions regarding coping with symptoms of anxiety and life stress.  PLAN: 1. Follow up with behavioral health clinician on : One month 2. Behavioral recommendations:  -Obtain Baby Box  from [facility], after watching required online video clips -Continue to set healthy boundaries in personal relationships -Continue with housing search via [place] into potential school programs with online options -Continue to be mindful of healthy eating habits, including to remind self of importance of eating more than once/day  3. Referral(s): Integrated [employer] [facility] (In Clinic)   Caroleen Hamman [place], [place]  Depression screen [facility] 2/9 09/21/2016 08/24/2016 07/27/2016 06/29/2016 05/19/2016  Decreased Interest 0 0 0 0 0  Down, Depressed, Hopeless 0 1 0 0 0  PHQ - 2 Score 0 1 0 0 0  Altered sleeping 1 0 0 1 1  Tired, decreased energy 1 1 0 1 1  Change in appetite 0 1 0 1 1  Feeling bad or failure about yourself  0 0 0 0 0  Trouble concentrating 0 0 0 0 0  Moving slowly or fidgety/restless 0 0 0 0 0  Suicidal thoughts 0 0 0 0 0  PHQ-9 Score 2 3 0 3 3   GAD 7 : Generalized Anxiety Score 09/21/2016 08/24/2016 07/27/2016 06/29/2016  Nervous, Anxious, on Edge 1 1 1 1   Control/stop worrying 0 0 0 1  Worry too much - different things 0 0 0 0  Trouble relaxing 1 0 0 0  Restless 0 0 0 0  Easily annoyed or irritable 1 1 0 1  Afraid - awful might happen 0 0 1 0  Total GAD 7 Score 3 2 2  3

## 2016-09-28 NOTE — Progress Notes (Signed)
Makena 250 mg IM administered as scheduled.  Pt tolerated well.  

## 2016-10-05 ENCOUNTER — Other Ambulatory Visit (HOSPITAL_COMMUNITY): Payer: Self-pay | Admitting: Maternal and Fetal Medicine

## 2016-10-05 ENCOUNTER — Other Ambulatory Visit: Payer: Self-pay | Admitting: Family

## 2016-10-05 ENCOUNTER — Ambulatory Visit (INDEPENDENT_AMBULATORY_CARE_PROVIDER_SITE_OTHER): Payer: Medicaid Other | Admitting: Medical

## 2016-10-05 ENCOUNTER — Ambulatory Visit: Payer: Self-pay

## 2016-10-05 ENCOUNTER — Encounter (HOSPITAL_COMMUNITY): Payer: Self-pay

## 2016-10-05 ENCOUNTER — Ambulatory Visit (HOSPITAL_COMMUNITY)
Admission: RE | Admit: 2016-10-05 | Discharge: 2016-10-05 | Disposition: A | Discharge status: Home / Self Care | Payer: Medicaid Other | Source: Ambulatory Visit | Attending: Obstetrics and Gynecology | Admitting: Obstetrics and Gynecology

## 2016-10-05 ENCOUNTER — Other Ambulatory Visit: Payer: Self-pay | Admitting: Medical

## 2016-10-05 VITALS — BP 112/60 | HR 93 | Wt 350.5 lb

## 2016-10-05 DIAGNOSIS — O09523 Supervision of elderly multigravida, third trimester: Secondary | ICD-10-CM | POA: Insufficient documentation

## 2016-10-05 DIAGNOSIS — O10913 Unspecified pre-existing hypertension complicating pregnancy, third trimester: Secondary | ICD-10-CM

## 2016-10-05 DIAGNOSIS — O09899 Supervision of other high risk pregnancies, unspecified trimester: Secondary | ICD-10-CM

## 2016-10-05 DIAGNOSIS — O10013 Pre-existing essential hypertension complicating pregnancy, third trimester: Secondary | ICD-10-CM

## 2016-10-05 DIAGNOSIS — O09529 Supervision of elderly multigravida, unspecified trimester: Secondary | ICD-10-CM

## 2016-10-05 DIAGNOSIS — O288 Other abnormal findings on antenatal screening of mother: Secondary | ICD-10-CM | POA: Diagnosis not present

## 2016-10-05 DIAGNOSIS — O09219 Supervision of pregnancy with history of pre-term labor, unspecified trimester: Secondary | ICD-10-CM

## 2016-10-05 DIAGNOSIS — O0993 Supervision of high risk pregnancy, unspecified, third trimester: Secondary | ICD-10-CM | POA: Diagnosis not present

## 2016-10-05 DIAGNOSIS — O99213 Obesity complicating pregnancy, third trimester: Secondary | ICD-10-CM

## 2016-10-05 DIAGNOSIS — O09213 Supervision of pregnancy with history of pre-term labor, third trimester: Secondary | ICD-10-CM

## 2016-10-05 DIAGNOSIS — Z3A32 32 weeks gestation of pregnancy: Secondary | ICD-10-CM

## 2016-10-05 DIAGNOSIS — O09293 Supervision of pregnancy with other poor reproductive or obstetric history, third trimester: Secondary | ICD-10-CM | POA: Diagnosis present

## 2016-10-05 DIAGNOSIS — Z8751 Personal history of pre-term labor: Secondary | ICD-10-CM

## 2016-10-05 NOTE — Progress Notes (Signed)
   PRENATAL VISIT NOTE  Subjective:  Diana Blair is a 36 y.o. G2P0100 at [redacted]w[redacted]d being seen today for ongoing prenatal care.  She is currently monitored for the following issues for this high-risk pregnancy and has PCOS (polycystic ovarian syndrome); Health care maintenance; History of preterm delivery; History of cervical cerclage; BMI 60.0-69.9, adult ([place]); Obesity affecting pregnancy in first trimester; Chronic hypertension in obstetric context in first trimester; and Supervision of high risk pregnancy in first trimester on her problem list.  Patient reports no complaints.  Contractions: Irregular. Vag. Bleeding: None.  Movement: Present. Denies leaking of fluid.   The following portions of the patient's history were reviewed and updated as appropriate: allergies, current medications, past family history, past medical history, past social history, past surgical history and problem list. Problem list updated.  Objective:   Vitals:   10/05/16 1510  BP: 112/60  Pulse: 93  Weight: (!) 350 lb 8 oz (159 kg)    Fetal Status: Fetal Heart Rate (bpm): NST   Movement: Present     General:  Alert, oriented and cooperative. Patient is in no acute distress.  Skin: Skin is warm and dry. No rash noted.   Cardiovascular: Normal heart rate noted  Respiratory: Normal respiratory effort, no problems with respiration noted  Abdomen: Soft, gravid, appropriate for gestational age. Pain/Pressure: Absent     Pelvic:  Cervical exam deferred        Extremities: Normal range of motion.  Edema: Trace  Mental Status: Normal mood and affect. Normal behavior. Normal judgment and thought content.   Assessment and Plan:  Pregnancy: G2P0100 at [redacted]w[redacted]d  1. Chronic hypertension in obstetric context, third trimester - Fetal nonstress test - reviewed, reactive, no contractions.  - [place] MFM FETAL BPP WO NON STRESS; scheduled - [place] MFM FETAL BPP WO NON STRESS; scheduled - [place] MFM FETAL BPP WO NON STRESS; scheduled  2.  History of preterm delivery - 17-P today  - patient denies contractions today, has only had a few some days recently   3. Supervision of high risk pregnancy in third trimester - Patient has growth [place] today following this visit   Preterm labor symptoms and general obstetric precautions including but not limited to vaginal bleeding, contractions, leaking of fluid and fetal movement were reviewed in detail with the patient. Please refer to After Visit Summary for other counseling recommendations.  Return in about 7 days (around 10/12/2016) for NST, 17P - has [place] @ 6160; 6/26 Ob fu and NSt, 17P - has [place] @ 1415.   Kerry Hough, PA-C

## 2016-10-05 NOTE — Progress Notes (Signed)
[place] for growth today - BPP added due to pt's work schedule and difficulty with scheduling twice weekly appts.  Pt will continue weekly BPP/NST visits.

## 2016-10-05 NOTE — Patient Instructions (Signed)
Fetal Movement Counts °Patient Name: ________________________________________________ Patient Due Date: ____________________ °What is a fetal movement count? °A fetal movement count is the number of times that you feel your baby move during a certain amount of time. This may also be called a fetal kick count. A fetal movement count is recommended for every pregnant woman. You may be asked to start counting fetal movements as early as week 28 of your pregnancy. °Pay attention to when your baby is most active. You may notice your baby's sleep and wake cycles. You may also notice things that make your baby move more. You should do a fetal movement count: °· When your baby is normally most active. °· At the same time each day. ° °A good time to count movements is while you are resting, after having something to eat and drink. °How do I count fetal movements? °1. Find a quiet, comfortable area. Sit, or lie down on your side. °2. Write down the date, the start time and stop time, and the number of movements that you felt between those two times. Take this information with you to your health care visits. °3. For 2 hours, count kicks, flutters, swishes, rolls, and jabs. You should feel at least 10 movements during 2 hours. °4. You may stop counting after you have felt 10 movements. °5. If you do not feel 10 movements in 2 hours, have something to eat and drink. Then, keep resting and counting for 1 hour. If you feel at least 4 movements during that hour, you may stop counting. °Contact a health care provider if: °· You feel fewer than 4 movements in 2 hours. °· Your baby is not moving like he or she usually does. °Date: ____________ Start time: ____________ Stop time: ____________ Movements: ____________ °Date: ____________ Start time: ____________ Stop time: ____________ Movements: ____________ °Date: ____________ Start time: ____________ Stop time: ____________ Movements: ____________ °Date: ____________ Start time:  ____________ Stop time: ____________ Movements: ____________ °Date: ____________ Start time: ____________ Stop time: ____________ Movements: ____________ °Date: ____________ Start time: ____________ Stop time: ____________ Movements: ____________ °Date: ____________ Start time: ____________ Stop time: ____________ Movements: ____________ °Date: ____________ Start time: ____________ Stop time: ____________ Movements: ____________ °Date: ____________ Start time: ____________ Stop time: ____________ Movements: ____________ °This information is not intended to replace advice given to you by your health care provider. Make sure you discuss any questions you have with your health care provider. °Document Released: 05/12/2006 Document Revised: 12/10/2015 Document Reviewed: 05/22/2015 °Elsevier Interactive Patient Education © 2018 [employer] °Braxton Hicks Contractions °Contractions of the uterus can occur throughout pregnancy, but they are not always a sign that you are in labor. You may have practice contractions called Braxton Hicks contractions. These false labor contractions are sometimes confused with true labor. °What are Braxton Hicks contractions? °Braxton Hicks contractions are tightening movements that occur in the muscles of the uterus before labor. Unlike true labor contractions, these contractions do not result in opening (dilation) and thinning of the cervix. Toward the end of pregnancy (32-34 weeks), Braxton Hicks contractions can happen more often and may become stronger. These contractions are sometimes difficult to tell apart from true labor because they can be very uncomfortable. You should not feel embarrassed if you go to the hospital with false labor. °Sometimes, the only way to tell if you are in true labor is for your health care provider to look for changes in the cervix. The health care provider will do a physical exam and may monitor your contractions. If   you are not in true labor, the exam  should show that your cervix is not dilating and your water has not broken. °If there are no prenatal problems or other health problems associated with your pregnancy, it is completely safe for you to be sent home with false labor. You may continue to have Braxton Hicks contractions until you go into true labor. °How can I tell the difference between true labor and false labor? °· Differences °? False labor °? Contractions last 30-70 seconds.: Contractions are usually shorter and not as strong as true labor contractions. °? Contractions become very regular.: Contractions are usually irregular. °? Discomfort is usually felt in the top of the uterus, and it spreads to the lower abdomen and low back.: Contractions are often felt in the front of the lower abdomen and in the groin. °? Contractions do not go away with walking.: Contractions may go away when you walk around or change positions while lying down. °? Contractions usually become more intense and increase in frequency.: Contractions get weaker and are shorter-lasting as time goes on. °? The cervix dilates and gets thinner.: The cervix usually does not dilate or become thin. °Follow these instructions at home: °· Take over-the-counter and prescription medicines only as told by your health care provider. °· Keep up with your usual exercises and follow other instructions from your health care provider. °· Eat and drink lightly if you think you are going into labor. °· If Braxton Hicks contractions are making you uncomfortable: °? Change your position from lying down or resting to walking, or change from walking to resting. °? Sit and rest in a tub of warm water. °? Drink enough fluid to keep your urine clear or pale yellow. Dehydration may cause these contractions. °? Do slow and deep breathing several times an hour. °· Keep all follow-up prenatal visits as told by your health care provider. This is important. °Contact a health care provider if: °· You have a  fever. °· You have continuous pain in your abdomen. °Get help right away if: °· Your contractions become stronger, more regular, and closer together. °· You have fluid leaking or gushing from your vagina. °· You pass blood-tinged mucus (bloody show). °· You have bleeding from your vagina. °· You have low back pain that you never had before. °· You feel your baby’s head pushing down and causing pelvic pressure. °· Your baby is not moving inside you as much as it used to. °Summary °· Contractions that occur before labor are called Braxton Hicks contractions, false labor, or practice contractions. °· Braxton Hicks contractions are usually shorter, weaker, farther apart, and less regular than true labor contractions. True labor contractions usually become progressively stronger and regular and they become more frequent. °· Manage discomfort from Braxton Hicks contractions by changing position, resting in a warm bath, drinking plenty of water, or practicing deep breathing. °This information is not intended to replace advice given to you by your health care provider. Make sure you discuss any questions you have with your health care provider. °Document Released: 04/12/2005 Document Revised: 03/01/2016 Document Reviewed: 03/01/2016 °Elsevier Interactive Patient Education © 2017 Elsevier Inc. ° °

## 2016-10-12 ENCOUNTER — Other Ambulatory Visit: Payer: Self-pay | Admitting: Medical

## 2016-10-12 ENCOUNTER — Encounter (HOSPITAL_COMMUNITY): Payer: Self-pay

## 2016-10-12 ENCOUNTER — Ambulatory Visit (HOSPITAL_COMMUNITY)
Admission: RE | Admit: 2016-10-12 | Discharge: 2016-10-12 | Disposition: A | Discharge status: Home / Self Care | Payer: Medicaid Other | Source: Ambulatory Visit | Attending: Medical | Admitting: Medical

## 2016-10-12 ENCOUNTER — Ambulatory Visit (INDEPENDENT_AMBULATORY_CARE_PROVIDER_SITE_OTHER): Payer: Medicaid Other | Admitting: Obstetrics and Gynecology

## 2016-10-12 VITALS — BP 112/66 | HR 96 | Wt 351.7 lb

## 2016-10-12 DIAGNOSIS — O09523 Supervision of elderly multigravida, third trimester: Secondary | ICD-10-CM

## 2016-10-12 DIAGNOSIS — O321XX0 Maternal care for breech presentation, not applicable or unspecified: Secondary | ICD-10-CM | POA: Diagnosis not present

## 2016-10-12 DIAGNOSIS — O10913 Unspecified pre-existing hypertension complicating pregnancy, third trimester: Secondary | ICD-10-CM | POA: Insufficient documentation

## 2016-10-12 DIAGNOSIS — O09293 Supervision of pregnancy with other poor reproductive or obstetric history, third trimester: Secondary | ICD-10-CM | POA: Insufficient documentation

## 2016-10-12 DIAGNOSIS — O09219 Supervision of pregnancy with history of pre-term labor, unspecified trimester: Secondary | ICD-10-CM | POA: Diagnosis not present

## 2016-10-12 DIAGNOSIS — O09899 Supervision of other high risk pregnancies, unspecified trimester: Secondary | ICD-10-CM

## 2016-10-12 DIAGNOSIS — O99213 Obesity complicating pregnancy, third trimester: Secondary | ICD-10-CM | POA: Diagnosis not present

## 2016-10-12 DIAGNOSIS — Z6841 Body Mass Index (BMI) 40.0 and over, adult: Secondary | ICD-10-CM | POA: Diagnosis not present

## 2016-10-12 DIAGNOSIS — Z8751 Personal history of pre-term labor: Secondary | ICD-10-CM

## 2016-10-12 DIAGNOSIS — O0993 Supervision of high risk pregnancy, unspecified, third trimester: Secondary | ICD-10-CM

## 2016-10-12 DIAGNOSIS — O09213 Supervision of pregnancy with history of pre-term labor, third trimester: Secondary | ICD-10-CM | POA: Diagnosis not present

## 2016-10-12 DIAGNOSIS — Z3A33 33 weeks gestation of pregnancy: Secondary | ICD-10-CM | POA: Diagnosis not present

## 2016-10-12 NOTE — Progress Notes (Signed)
BPP scheduled today and weekly @ [facility].  [place] for growth on 7/10.

## 2016-10-12 NOTE — Progress Notes (Signed)
   PRENATAL VISIT NOTE  Subjective:  Diana Blair is a 36 y.o. G2P0100 at [redacted]w[redacted]d being seen today for ongoing prenatal care.  She is currently monitored for the following issues for this high-risk pregnancy and has PCOS (polycystic ovarian syndrome); Health care maintenance; History of preterm delivery; History of cervical cerclage; BMI 60.0-69.9, adult ([place]); Obesity affecting pregnancy in first trimester; Chronic hypertension in obstetric context in first trimester; and Supervision of high risk pregnancy in first trimester on her problem list.  Patient reports no complaints.  Contractions: Irregular. Vag. Bleeding: None.  Movement: Present. Denies leaking of fluid.   The following portions of the patient's history were reviewed and updated as appropriate: allergies, current medications, past family history, past medical history, past social history, past surgical history and problem list. Problem list updated.  Objective:   Vitals:   10/12/16 1427  BP: 112/66  Pulse: 96  Weight: (!) 351 lb 11.2 oz (159.5 kg)    Fetal Status: Fetal Heart Rate (bpm): NST   Movement: Present     General:  Alert, oriented and cooperative. Patient is in no acute distress.  Skin: Skin is warm and dry. No rash noted.   Cardiovascular: Normal heart rate noted  Respiratory: Normal respiratory effort, no problems with respiration noted  Abdomen: Soft, gravid, appropriate for gestational age. Pain/Pressure: Absent     Pelvic:  Cervical exam deferred        Extremities: Normal range of motion.  Edema: Trace  Mental Status: Normal mood and affect. Normal behavior. Normal judgment and thought content.   Assessment and Plan:  Pregnancy: G2P0100 at [redacted]w[redacted]d  1. Chronic hypertension in obstetric context, third trimester  - Fetal nonstress test - [place] MFM FETAL BPP WO NON STRESS; Future - [place] MFM OB FOLLOW UP; Future - [place] MFM FETAL BPP WO NON STRESS; Future  Per Dr. Domenic Schwab note on 5/29: Chart reviewed because  patient questioned the origin of this diagnosis. Upon chart review, only elevated BP was noted on 05/02/2016 during an MAU visit with BP as high as 146/62. No further elevated BP since that visit. Patient desires to be treated as a CHTN with fetal monitoring and growth ultrasound.  Fetal Tracing: Baseline: 140 bpm Variability: Moderate  Accelerations: 15x15 Decelerations: none Toco: none   2. History of preterm delivery  - 17P today.   3. Supervision of high risk pregnancy in third trimester  - Discussed previous US - BPP today following NST.   Preterm labor symptoms and general obstetric precautions including but not limited to vaginal bleeding, contractions, leaking of fluid and fetal movement were reviewed in detail with the patient. Please refer to After Visit Summary for other counseling recommendations.  Return in about 2 weeks (around 10/26/2016) for Ob fu and NST - has [place] @ 1230; 7/10 Ob fu and NST @ 1340 please - has [place] @ 1515.   Loye Vento, Darrelyn Hillock, NP

## 2016-10-19 ENCOUNTER — Encounter (HOSPITAL_COMMUNITY): Payer: Self-pay

## 2016-10-19 ENCOUNTER — Ambulatory Visit (HOSPITAL_COMMUNITY)
Admission: RE | Admit: 2016-10-19 | Discharge: 2016-10-19 | Disposition: A | Discharge status: Home / Self Care | Payer: Medicaid Other | Source: Ambulatory Visit | Attending: Medical | Admitting: Medical

## 2016-10-19 ENCOUNTER — Other Ambulatory Visit: Payer: Self-pay | Admitting: Medical

## 2016-10-19 ENCOUNTER — Ambulatory Visit (INDEPENDENT_AMBULATORY_CARE_PROVIDER_SITE_OTHER): Payer: Medicaid Other | Admitting: Obstetrics and Gynecology

## 2016-10-19 VITALS — BP 130/54 | HR 84 | Wt 353.0 lb

## 2016-10-19 DIAGNOSIS — O99213 Obesity complicating pregnancy, third trimester: Secondary | ICD-10-CM | POA: Diagnosis not present

## 2016-10-19 DIAGNOSIS — Z8751 Personal history of pre-term labor: Secondary | ICD-10-CM

## 2016-10-19 DIAGNOSIS — O09219 Supervision of pregnancy with history of pre-term labor, unspecified trimester: Secondary | ICD-10-CM

## 2016-10-19 DIAGNOSIS — O09293 Supervision of pregnancy with other poor reproductive or obstetric history, third trimester: Secondary | ICD-10-CM | POA: Diagnosis not present

## 2016-10-19 DIAGNOSIS — O09523 Supervision of elderly multigravida, third trimester: Secondary | ICD-10-CM

## 2016-10-19 DIAGNOSIS — O09299 Supervision of pregnancy with other poor reproductive or obstetric history, unspecified trimester: Secondary | ICD-10-CM

## 2016-10-19 DIAGNOSIS — Z3A34 34 weeks gestation of pregnancy: Secondary | ICD-10-CM | POA: Insufficient documentation

## 2016-10-19 DIAGNOSIS — O10913 Unspecified pre-existing hypertension complicating pregnancy, third trimester: Secondary | ICD-10-CM | POA: Insufficient documentation

## 2016-10-19 DIAGNOSIS — O09211 Supervision of pregnancy with history of pre-term labor, first trimester: Secondary | ICD-10-CM

## 2016-10-19 DIAGNOSIS — O99211 Obesity complicating pregnancy, first trimester: Secondary | ICD-10-CM

## 2016-10-19 DIAGNOSIS — O10911 Unspecified pre-existing hypertension complicating pregnancy, first trimester: Secondary | ICD-10-CM | POA: Diagnosis not present

## 2016-10-19 DIAGNOSIS — O321XX0 Maternal care for breech presentation, not applicable or unspecified: Secondary | ICD-10-CM | POA: Insufficient documentation

## 2016-10-19 DIAGNOSIS — O0991 Supervision of high risk pregnancy, unspecified, first trimester: Secondary | ICD-10-CM | POA: Diagnosis not present

## 2016-10-19 DIAGNOSIS — E669 Obesity, unspecified: Secondary | ICD-10-CM | POA: Diagnosis not present

## 2016-10-19 LAB — POCT URINALYSIS DIP (DEVICE)
Bilirubin Urine: NEGATIVE
GLUCOSE, UA: NEGATIVE mg/dL
Ketones, ur: NEGATIVE mg/dL
LEUKOCYTES UA: NEGATIVE
NITRITE: NEGATIVE
PROTEIN: NEGATIVE mg/dL
Specific Gravity, Urine: 1.02 (ref 1.005–1.030)
UROBILINOGEN UA: 0.2 mg/dL (ref 0.0–1.0)
pH: 6 (ref 5.0–8.0)

## 2016-10-19 NOTE — Progress Notes (Signed)
   PRENATAL VISIT NOTE  Subjective:  Diana Blair is a 36 y.o. G2P0100 at [redacted]w[redacted]d being seen today for ongoing prenatal care.  She is currently monitored for the following issues for this high-risk pregnancy and has PCOS (polycystic ovarian syndrome); Health care maintenance; History of preterm delivery; History of cervical cerclage; BMI 60.0-69.9, adult ([place]); Obesity affecting pregnancy in first trimester; Chronic hypertension in obstetric context in first trimester; and Supervision of high risk pregnancy in first trimester on her problem list.  Patient reports no complaints.  Contractions: Irregular. Vag. Bleeding: None.  Movement: (!) Decreased. Denies leaking of fluid.   The following portions of the patient's history were reviewed and updated as appropriate: allergies, current medications, past family history, past medical history, past social history, past surgical history and problem list. Problem list updated.  Objective:   Vitals:   10/19/16 1346  BP: (!) 130/54  Pulse: 84  Weight: (!) 353 lb (160.1 kg)    Fetal Status: Fetal Heart Rate (bpm): NST   Movement: (!) Decreased    NST: 130/mod var/reactive no decels or contractions  General:  Alert, oriented and cooperative. Patient is in no acute distress.  Skin: Skin is warm and dry. No rash noted.   Cardiovascular: Normal heart rate noted  Respiratory: Normal respiratory effort, no problems with respiration noted  Abdomen: Soft, gravid, appropriate for gestational age. Pain/Pressure: Present     Pelvic:  Cervical exam deferred        Extremities: Normal range of motion.  Edema: Trace  Mental Status: Normal mood and affect. Normal behavior. Normal judgment and thought content.   Assessment and Plan:  Pregnancy: G2P0100 at [redacted]w[redacted]d  1. Obesity affecting pregnancy in first trimester Encouraged exercise - growth [place] scheduled. Normal 2 hr ftt  2. History of preterm delivery Continue 17-p - Fetal nonstress test  3. Chronic  hypertension in obstetric context in first trimester BP normal today = not on any meds -BPP today - continue twice weekly testing - Fetal nonstress test  4. Supervision of high risk pregnancy -uptodate GBS at 36 wks.  Preterm labor symptoms and general obstetric precautions including but not limited to vaginal bleeding, contractions, leaking of fluid and fetal movement were reviewed in detail with the patient. Please refer to After Visit Summary for other counseling recommendations.  Return in about 2 weeks (around 11/02/2016) for twice weekly NST, NST w/ provider in 2 wks.   Jacquiline Doe, MD

## 2016-10-19 NOTE — Patient Instructions (Signed)
Third Trimester of Pregnancy The third trimester is from week 28 through week 40 (months 7 through 9). The third trimester is a time when the unborn baby (fetus) is growing rapidly. At the end of the ninth month, the fetus is about 20 inches in length and weighs 6-10 pounds. Body changes during your third trimester Your body will continue to go through many changes during pregnancy. The changes vary from woman to woman. During the third trimester:  Your weight will continue to increase. You can expect to gain 25-35 pounds (11-16 kg) by the end of the pregnancy.  You may begin to get stretch marks on your hips, abdomen, and breasts.  You may urinate more often because the fetus is moving lower into your pelvis and pressing on your bladder.  You may develop or continue to have heartburn. This is caused by increased hormones that slow down muscles in the digestive tract.  You may develop or continue to have constipation because increased hormones slow digestion and cause the muscles that push waste through your intestines to relax.  You may develop hemorrhoids. These are swollen veins (varicose veins) in the rectum that can itch or be painful.  You may develop swollen, bulging veins (varicose veins) in your legs.  You may have increased body aches in the pelvis, back, or thighs. This is due to weight gain and increased hormones that are relaxing your joints.  You may have changes in your hair. These can include thickening of your hair, rapid growth, and changes in texture. Some women also have hair loss during or after pregnancy, or hair that feels dry or thin. Your hair will most likely return to normal after your baby is born.  Your breasts will continue to grow and they will continue to become tender. A yellow fluid (colostrum) may leak from your breasts. This is the first milk you are producing for your baby.  Your belly button may stick out.  You may notice more swelling in your hands,  face, or ankles.  You may have increased tingling or numbness in your hands, arms, and legs. The skin on your belly may also feel numb.  You may feel short of breath because of your expanding uterus.  You may have more problems sleeping. This can be caused by the size of your belly, increased need to urinate, and an increase in your body's metabolism.  You may notice the fetus "dropping," or moving lower in your abdomen (lightening).  You may have increased vaginal discharge.  You may notice your joints feel loose and you may have pain around your pelvic bone.  What to expect at prenatal visits You will have prenatal exams every 2 weeks until week 36. Then you will have weekly prenatal exams. During a routine prenatal visit:  You will be weighed to make sure you and the baby are growing normally.  Your blood pressure will be taken.  Your abdomen will be measured to track your baby's growth.  The fetal heartbeat will be listened to.  Any test results from the previous visit will be discussed.  You may have a cervical check near your due date to see if your cervix has softened or thinned (effaced).  You will be tested for Group B streptococcus. This happens between 35 and 37 weeks.  Your health care provider may ask you:  What your birth plan is.  How you are feeling.  If you are feeling the baby move.  If you have had   any abnormal symptoms, such as leaking fluid, bleeding, severe headaches, or abdominal cramping.  If you are using any tobacco products, including cigarettes, chewing tobacco, and electronic cigarettes.  If you have any questions.  Other tests or screenings that may be performed during your third trimester include:  Blood tests that check for low iron levels (anemia).  Fetal testing to check the health, activity level, and growth of the fetus. Testing is done if you have certain medical conditions or if there are problems during the  pregnancy.  Nonstress test (NST). This test checks the health of your baby to make sure there are no signs of problems, such as the baby not getting enough oxygen. During this test, a belt is placed around your belly. The baby is made to move, and its heart rate is monitored during movement.  What is false labor? False labor is a condition in which you feel small, irregular tightenings of the muscles in the womb (contractions) that usually go away with rest, changing position, or drinking water. These are called Braxton Hicks contractions. Contractions may last for hours, days, or even weeks before true labor sets in. If contractions come at regular intervals, become more frequent, increase in intensity, or become painful, you should see your health care provider. What are the signs of labor?  Abdominal cramps.  Regular contractions that start at 10 minutes apart and become stronger and more frequent with time.  Contractions that start on the top of the uterus and spread down to the lower abdomen and back.  Increased pelvic pressure and dull back pain.  A watery or bloody mucus discharge that comes from the vagina.  Leaking of amniotic fluid. This is also known as your "water breaking." It could be a slow trickle or a gush. Let your health care provider know if it has a color or strange odor. If you have any of these signs, call your health care provider right away, even if it is before your due date. Follow these instructions at home: Medicines  Follow your health care provider's instructions regarding medicine use. Specific medicines may be either safe or unsafe to take during pregnancy.  Take a prenatal vitamin that contains at least 600 micrograms (mcg) of folic acid.  If you develop constipation, try taking a stool softener if your health care provider approves. Eating and drinking  Eat a balanced diet that includes fresh fruits and vegetables, whole grains, good sources of protein  such as meat, eggs, or tofu, and low-fat dairy. Your health care provider will help you determine the amount of weight gain that is right for you.  Avoid raw meat and uncooked cheese. These carry germs that can cause birth defects in the baby.  If you have low calcium intake from food, talk to your health care provider about whether you should take a daily calcium supplement.  Eat four or five small meals rather than three large meals a day.  Limit foods that are high in fat and processed sugars, such as fried and sweet foods.  To prevent constipation: ? Drink enough fluid to keep your urine clear or pale yellow. ? Eat foods that are high in fiber, such as fresh fruits and vegetables, whole grains, and beans. Activity  Exercise only as directed by your health care provider. Most women can continue their usual exercise routine during pregnancy. Try to exercise for 30 minutes at least 5 days a week. Stop exercising if you experience uterine contractions.  Avoid heavy   lifting.  Do not exercise in extreme heat or humidity, or at high altitudes.  Wear low-heel, comfortable shoes.  Practice good posture.  You may continue to have sex unless your health care provider tells you otherwise. Relieving pain and discomfort  Take frequent breaks and rest with your legs elevated if you have leg cramps or low back pain.  Take warm sitz baths to soothe any pain or discomfort caused by hemorrhoids. Use hemorrhoid cream if your health care provider approves.  Wear a good support bra to prevent discomfort from breast tenderness.  If you develop varicose veins: ? Wear support pantyhose or compression stockings as told by your healthcare provider. ? Elevate your feet for 15 minutes, 3-4 times a day. Prenatal care  Write down your questions. Take them to your prenatal visits.  Keep all your prenatal visits as told by your health care provider. This is important. Safety  Wear your seat belt at  all times when driving.  Make a list of emergency phone numbers, including numbers for family, friends, the hospital, and police and fire departments. General instructions  Avoid cat litter boxes and soil used by cats. These carry germs that can cause birth defects in the baby. If you have a cat, ask someone to clean the litter box for you.  Do not travel far distances unless it is absolutely necessary and only with the approval of your health care provider.  Do not use hot tubs, steam rooms, or saunas.  Do not drink alcohol.  Do not use any products that contain nicotine or tobacco, such as cigarettes and e-cigarettes. If you need help quitting, ask your health care provider.  Do not use any medicinal herbs or unprescribed drugs. These chemicals affect the formation and growth of the baby.  Do not douche or use tampons or scented sanitary pads.  Do not cross your legs for long periods of time.  To prepare for the arrival of your baby: ? Take prenatal classes to understand, practice, and ask questions about labor and delivery. ? Make a trial run to the hospital. ? Visit the hospital and tour the maternity area. ? Arrange for maternity or paternity leave through employers. ? Arrange for family and friends to take care of pets while you are in the hospital. ? Purchase a rear-facing car seat and make sure you know how to install it in your car. ? Pack your hospital bag. ? Prepare the baby's nursery. Make sure to remove all pillows and stuffed animals from the baby's crib to prevent suffocation.  Visit your dentist if you have not gone during your pregnancy. Use a soft toothbrush to brush your teeth and be gentle when you floss. Contact a health care provider if:  You are unsure if you are in labor or if your water has broken.  You become dizzy.  You have mild pelvic cramps, pelvic pressure, or nagging pain in your abdominal area.  You have lower back pain.  You have persistent  nausea, vomiting, or diarrhea.  You have an unusual or bad smelling vaginal discharge.  You have pain when you urinate. Get help right away if:  Your water breaks before 37 weeks.  You have regular contractions less than 5 minutes apart before 37 weeks.  You have a fever.  You are leaking fluid from your vagina.  You have spotting or bleeding from your vagina.  You have severe abdominal pain or cramping.  You have rapid weight loss or weight gain.    You have shortness of breath with chest pain.  You notice sudden or extreme swelling of your face, hands, ankles, feet, or legs.  Your baby makes fewer than 10 movements in 2 hours.  You have severe headaches that do not go away when you take medicine.  You have vision changes. Summary  The third trimester is from week 28 through week 40, months 7 through 9. The third trimester is a time when the unborn baby (fetus) is growing rapidly.  During the third trimester, your discomfort may increase as you and your baby continue to gain weight. You may have abdominal, leg, and back pain, sleeping problems, and an increased need to urinate.  During the third trimester your breasts will keep growing and they will continue to become tender. A yellow fluid (colostrum) may leak from your breasts. This is the first milk you are producing for your baby.  False labor is a condition in which you feel small, irregular tightenings of the muscles in the womb (contractions) that eventually go away. These are called Braxton Hicks contractions. Contractions may last for hours, days, or even weeks before true labor sets in.  Signs of labor can include: abdominal cramps; regular contractions that start at 10 minutes apart and become stronger and more frequent with time; watery or bloody mucus discharge that comes from the vagina; increased pelvic pressure and dull back pain; and leaking of amniotic fluid. This information is not intended to replace advice  given to you by your health care provider. Make sure you discuss any questions you have with your health care provider. Document Released: 04/06/2001 Document Revised: 09/18/2015 Document Reviewed: 06/13/2012 Elsevier Interactive Patient Education  2017 [employer]  

## 2016-10-21 ENCOUNTER — Institutional Professional Consult (permissible substitution): Payer: Self-pay | Admitting: Pediatrics

## 2016-10-26 ENCOUNTER — Ambulatory Visit (HOSPITAL_COMMUNITY)
Admission: RE | Admit: 2016-10-26 | Discharge: 2016-10-26 | Disposition: A | Discharge status: Home / Self Care | Payer: Medicaid Other | Source: Ambulatory Visit | Attending: Obstetrics and Gynecology | Admitting: Obstetrics and Gynecology

## 2016-10-26 ENCOUNTER — Other Ambulatory Visit: Payer: Self-pay | Admitting: Advanced Practice Midwife

## 2016-10-26 ENCOUNTER — Ambulatory Visit (INDEPENDENT_AMBULATORY_CARE_PROVIDER_SITE_OTHER): Payer: Medicaid Other | Admitting: Obstetrics and Gynecology

## 2016-10-26 ENCOUNTER — Other Ambulatory Visit: Payer: Self-pay | Admitting: Obstetrics and Gynecology

## 2016-10-26 ENCOUNTER — Telehealth: Payer: Self-pay | Admitting: Advanced Practice Midwife

## 2016-10-26 VITALS — BP 138/67 | HR 90 | Wt 352.7 lb

## 2016-10-26 DIAGNOSIS — O09213 Supervision of pregnancy with history of pre-term labor, third trimester: Secondary | ICD-10-CM | POA: Diagnosis present

## 2016-10-26 DIAGNOSIS — O10913 Unspecified pre-existing hypertension complicating pregnancy, third trimester: Secondary | ICD-10-CM

## 2016-10-26 DIAGNOSIS — O321XX0 Maternal care for breech presentation, not applicable or unspecified: Secondary | ICD-10-CM | POA: Insufficient documentation

## 2016-10-26 DIAGNOSIS — O0991 Supervision of high risk pregnancy, unspecified, first trimester: Secondary | ICD-10-CM

## 2016-10-26 DIAGNOSIS — O0993 Supervision of high risk pregnancy, unspecified, third trimester: Secondary | ICD-10-CM

## 2016-10-26 DIAGNOSIS — O99213 Obesity complicating pregnancy, third trimester: Secondary | ICD-10-CM | POA: Insufficient documentation

## 2016-10-26 DIAGNOSIS — Z3A35 35 weeks gestation of pregnancy: Secondary | ICD-10-CM | POA: Insufficient documentation

## 2016-10-26 DIAGNOSIS — O10911 Unspecified pre-existing hypertension complicating pregnancy, first trimester: Secondary | ICD-10-CM

## 2016-10-26 DIAGNOSIS — Z8751 Personal history of pre-term labor: Secondary | ICD-10-CM

## 2016-10-26 NOTE — Telephone Encounter (Signed)
Patient was called on 07/02 about her appointment being changed to the morning. I left a message, and then called again this morning. I had to leave another voicemail.

## 2016-10-26 NOTE — Progress Notes (Addendum)
   PRENATAL VISIT NOTE  Subjective:  Diana Blair is a 36 y.o. G2P0100 at [redacted]w[redacted]d being seen today for ongoing prenatal care.  She is currently monitored for the following issues for this high-risk pregnancy and has PCOS (polycystic ovarian syndrome); Health care maintenance; History of preterm delivery; History of cervical cerclage; BMI 60.0-69.9, adult ([name]); Obesity affecting pregnancy in first trimester; Chronic hypertension in obstetric context in first trimester; and Supervision of high risk pregnancy in first trimester on her problem list.  Patient reports no complaints.  Contractions: Irregular. Vag. Bleeding: None.  Movement: Present. Denies leaking of fluid.   The following portions of the patient's history were reviewed and updated as appropriate: allergies, current medications, past family history, past medical history, past social history, past surgical history and problem list. Problem list updated.  Objective:   Vitals:   10/26/16 1406  BP: (!) 146/66  Pulse: 86  Weight: (!) 352 lb 11.2 oz (160 kg)    Fetal Status: Fetal Heart Rate (bpm): NST   Movement: Present     General:  Alert, oriented and cooperative. Patient is in no acute distress.  Skin: Skin is warm and dry. No rash noted.   Cardiovascular: Normal heart rate noted  Respiratory: Normal respiratory effort, no problems with respiration noted  Abdomen: Soft, gravid, appropriate for gestational age. Pain/Pressure: Present     Pelvic:  Cervical exam deferred        Extremities: Normal range of motion.  Edema: Trace  Mental Status: Normal mood and affect. Normal behavior. Normal judgment and thought content.   Assessment and Plan:  Pregnancy: G2P0100 at [redacted]w[redacted]d  1. Supervision of high risk pregnancy in first trimester Patient is doing well without complaints Cultures next visit  2. Chronic hypertension in obstetric context in first trimester Elevated bp today without meds or symptoms- continue close  monitoring BPP earlier this morning NST reviewed and reactive with baseline 130, mod variability, + accels, no decels  3. History of preterm delivery Continue weekly 17-P  Preterm labor symptoms and general obstetric precautions including but not limited to vaginal bleeding, contractions, leaking of fluid and fetal movement were reviewed in detail with the patient. Please refer to After Visit Summary for other counseling recommendations.  No Follow-up on file.   Mora Bellman, MD

## 2016-10-26 NOTE — Progress Notes (Signed)
Opened in error

## 2016-11-02 ENCOUNTER — Other Ambulatory Visit: Payer: Self-pay | Admitting: Obstetrics and Gynecology

## 2016-11-02 ENCOUNTER — Encounter (HOSPITAL_COMMUNITY): Payer: Self-pay

## 2016-11-02 ENCOUNTER — Ambulatory Visit (INDEPENDENT_AMBULATORY_CARE_PROVIDER_SITE_OTHER): Payer: Medicaid Other | Admitting: Obstetrics and Gynecology

## 2016-11-02 ENCOUNTER — Other Ambulatory Visit (HOSPITAL_COMMUNITY)
Admission: RE | Admit: 2016-11-02 | Discharge: 2016-11-02 | Disposition: A | Discharge status: Home / Self Care | Payer: Medicaid Other | Source: Ambulatory Visit | Attending: Obstetrics and Gynecology | Admitting: Obstetrics and Gynecology

## 2016-11-02 ENCOUNTER — Ambulatory Visit (HOSPITAL_COMMUNITY)
Admission: RE | Admit: 2016-11-02 | Discharge: 2016-11-02 | Disposition: A | Discharge status: Home / Self Care | Payer: Medicaid Other | Source: Ambulatory Visit | Attending: Obstetrics and Gynecology | Admitting: Obstetrics and Gynecology

## 2016-11-02 VITALS — BP 146/69 | HR 88 | Wt 356.0 lb

## 2016-11-02 DIAGNOSIS — O09522 Supervision of elderly multigravida, second trimester: Secondary | ICD-10-CM

## 2016-11-02 DIAGNOSIS — Z3A36 36 weeks gestation of pregnancy: Secondary | ICD-10-CM | POA: Insufficient documentation

## 2016-11-02 DIAGNOSIS — Z9889 Other specified postprocedural states: Secondary | ICD-10-CM

## 2016-11-02 DIAGNOSIS — O10913 Unspecified pre-existing hypertension complicating pregnancy, third trimester: Secondary | ICD-10-CM

## 2016-11-02 DIAGNOSIS — Z8751 Personal history of pre-term labor: Secondary | ICD-10-CM

## 2016-11-02 DIAGNOSIS — O09299 Supervision of pregnancy with other poor reproductive or obstetric history, unspecified trimester: Secondary | ICD-10-CM

## 2016-11-02 DIAGNOSIS — O99213 Obesity complicating pregnancy, third trimester: Secondary | ICD-10-CM | POA: Diagnosis not present

## 2016-11-02 DIAGNOSIS — Z6841 Body Mass Index (BMI) 40.0 and over, adult: Secondary | ICD-10-CM | POA: Diagnosis not present

## 2016-11-02 DIAGNOSIS — O0991 Supervision of high risk pregnancy, unspecified, first trimester: Secondary | ICD-10-CM

## 2016-11-02 DIAGNOSIS — Z113 Encounter for screening for infections with a predominantly sexual mode of transmission: Secondary | ICD-10-CM

## 2016-11-02 DIAGNOSIS — O09213 Supervision of pregnancy with history of pre-term labor, third trimester: Secondary | ICD-10-CM | POA: Insufficient documentation

## 2016-11-02 DIAGNOSIS — O09293 Supervision of pregnancy with other poor reproductive or obstetric history, third trimester: Secondary | ICD-10-CM | POA: Diagnosis not present

## 2016-11-02 DIAGNOSIS — O09523 Supervision of elderly multigravida, third trimester: Secondary | ICD-10-CM

## 2016-11-02 LAB — POCT URINALYSIS DIP (DEVICE)
Bilirubin Urine: NEGATIVE
GLUCOSE, UA: NEGATIVE mg/dL
Hgb urine dipstick: NEGATIVE
KETONES UR: NEGATIVE mg/dL
LEUKOCYTES UA: NEGATIVE
Nitrite: NEGATIVE
Protein, ur: 30 mg/dL — AB
SPECIFIC GRAVITY, URINE: 1.025 (ref 1.005–1.030)
Urobilinogen, UA: 0.2 mg/dL (ref 0.0–1.0)
pH: 6 (ref 5.0–8.0)

## 2016-11-02 MED ORDER — HYDROXYPROGESTERONE CAPROATE 250 MG/ML IM OIL
250.0000 mg | TOPICAL_OIL | Freq: Once | INTRAMUSCULAR | Status: AC
Start: 2016-11-02 — End: 2016-11-02
  Administered 2016-11-02: 250 mg via INTRAMUSCULAR

## 2016-11-02 NOTE — Addendum Note (Signed)
Addended by: Phillip Heal, Kilian Schwartz A on: 11/02/2016 04:51 PM   Modules accepted: Orders

## 2016-11-02 NOTE — Progress Notes (Signed)
US for growth and BPP today 

## 2016-11-02 NOTE — Progress Notes (Addendum)
   PRENATAL VISIT NOTE  Subjective:  Diana Blair is a 36 y.o. G2P0100 at [redacted]w[redacted]d being seen today for ongoing prenatal care.  She is currently monitored for the following issues for this high risk pregnancy and has PCOS (polycystic ovarian syndrome); Health care maintenance; History of preterm delivery; History of cervical cerclage; BMI 60.0-69.9, adult ([place]); Obesity affecting pregnancy in first trimester; Chronic hypertension in obstetric context, third trimester; and Supervision of high risk pregnancy in first trimester on her problem list.  Patient reports no complaints.  Contractions: Irregular. Vag. Bleeding: None.  Movement: Present. Denies leaking of fluid. Denies HA or changes in her vision   The following portions of the patient's history were reviewed and updated as appropriate: allergies, current medications, past family history, past medical history, past social history, past surgical history and problem list. Problem list updated.  Objective:   Vitals:   11/02/16 1411  BP: (!) 146/69  Pulse: 88  Weight: (!) 356 lb (161.5 kg)    Fetal Status: Fetal Heart Rate (bpm): NST   Movement: Present     General:  Alert, oriented and cooperative. Patient is in no acute distress.  Skin: Skin is warm and dry. No rash noted.   Cardiovascular: Normal heart rate noted  Respiratory: Normal respiratory effort, no problems with respiration noted  Abdomen: Soft, gravid, appropriate for gestational age. Pain/Pressure: Present     Pelvic:  Cervical exam deferred        Extremities: Normal range of motion.  Edema: None  Mental Status: Normal mood and affect. Normal behavior. Normal judgment and thought content.   Assessment and Plan:  Pregnancy: G2P0100 at [redacted]w[redacted]d  1. Chronic hypertension in obstetric context, third trimester  - [place] MFM FETAL BPP WO NON STRESS; Future - Fetal nonstress test; reactive NST today. + accels, no decels, 135 baseline.  - BP elevated today 146/69- patient without  symptoms, not on medications. Discussed with Dr. Nehemiah Settle. Labs ordered today CBC, CMP, PCR.  - Continue twice weekly testing per Dr. Nehemiah Settle. Will monitor closely for severe range blood pressures. Continue plan for induction at 39 weeks at this time unless signs of preeclampsia, severe range BP then consider induction at 37 weeks.   2. Supervision of high risk pregnancy in first trimester  - GC/Chlamydia probe amp ([place])not at [facility] - Strep Gp B NAA - 17P given today.   3. History of cervical cerclage  Previous pregnancy.   Preterm labor symptoms and general obstetric precautions including but not limited to vaginal bleeding, contractions, leaking of fluid and fetal movement were reviewed in detail with the patient. Please refer to After Visit Summary for other counseling recommendations.  Return in about 8 days (around 11/10/2016) for as scheduled.   Rasch, Artist Pais, NP'

## 2016-11-03 ENCOUNTER — Other Ambulatory Visit (HOSPITAL_COMMUNITY): Payer: Self-pay | Admitting: *Deleted

## 2016-11-03 DIAGNOSIS — O365930 Maternal care for other known or suspected poor fetal growth, third trimester, not applicable or unspecified: Secondary | ICD-10-CM

## 2016-11-03 LAB — COMPREHENSIVE METABOLIC PANEL
A/G RATIO: 1.2 (ref 1.2–2.2)
ALBUMIN: 3.3 g/dL — AB (ref 3.5–5.5)
ALK PHOS: 92 IU/L (ref 39–117)
ALT: 17 IU/L (ref 0–32)
AST: 17 IU/L (ref 0–40)
BUN / CREAT RATIO: 9 (ref 9–23)
BUN: 5 mg/dL — ABNORMAL LOW (ref 6–20)
CHLORIDE: 107 mmol/L — AB (ref 96–106)
CO2: 18 mmol/L — ABNORMAL LOW (ref 20–29)
Calcium: 8.8 mg/dL (ref 8.7–10.2)
Creatinine, Ser: 0.55 mg/dL — ABNORMAL LOW (ref 0.57–1.00)
GFR calc non Af Amer: 122 mL/min/{1.73_m2} (ref >59)
GFR, EST AFRICAN AMERICAN: 141 mL/min/{1.73_m2} (ref >59)
GLOBULIN, TOTAL: 2.7 g/dL (ref 1.5–4.5)
Glucose: 104 mg/dL — ABNORMAL HIGH (ref 65–99)
POTASSIUM: 4 mmol/L (ref 3.5–5.2)
SODIUM: 137 mmol/L (ref 134–144)
Total Protein: 6 g/dL (ref 6.0–8.5)

## 2016-11-03 LAB — PROTEIN / CREATININE RATIO, URINE
Creatinine, Urine: 168.9 mg/dL
Protein, Ur: 22.1 mg/dL
Protein/Creat Ratio: 131 mg/g creat (ref 0–200)

## 2016-11-03 LAB — CBC
HEMOGLOBIN: 12.5 g/dL (ref 11.1–15.9)
Hematocrit: 35.7 % (ref 34.0–46.6)
MCH: 28.1 pg (ref 26.6–33.0)
MCHC: 35 g/dL (ref 31.5–35.7)
MCV: 80 fL (ref 79–97)
Platelets: 191 10*3/uL (ref 150–379)
RBC: 4.45 x10E6/uL (ref 3.77–5.28)
RDW: 15.7 % — ABNORMAL HIGH (ref 12.3–15.4)
WBC: 7.3 10*3/uL (ref 3.4–10.8)

## 2016-11-04 LAB — OB RESULTS CONSOLE GBS: GBS: NEGATIVE

## 2016-11-04 LAB — STREP GP B NAA: STREP GROUP B AG: NEGATIVE

## 2016-11-04 LAB — CERVICOVAGINAL ANCILLARY ONLY
Chlamydia: NEGATIVE
Neisseria Gonorrhea: NEGATIVE

## 2016-11-05 ENCOUNTER — Ambulatory Visit (HOSPITAL_COMMUNITY)
Admission: RE | Admit: 2016-11-05 | Discharge: 2016-11-05 | Disposition: A | Discharge status: Home / Self Care | Payer: Medicaid Other | Source: Ambulatory Visit | Attending: Obstetrics and Gynecology | Admitting: Obstetrics and Gynecology

## 2016-11-05 ENCOUNTER — Encounter (HOSPITAL_COMMUNITY): Payer: Self-pay

## 2016-11-05 ENCOUNTER — Inpatient Hospital Stay (HOSPITAL_COMMUNITY)
Admission: AD | Admit: 2016-11-05 | Discharge: 2016-11-06 | DRG: 781 | Disposition: A | Discharge status: Home / Self Care | Payer: Medicaid Other | Source: Ambulatory Visit | Attending: Obstetrics & Gynecology | Admitting: Obstetrics & Gynecology

## 2016-11-05 ENCOUNTER — Encounter (HOSPITAL_COMMUNITY): Payer: Self-pay | Admitting: *Deleted

## 2016-11-05 DIAGNOSIS — O365939 Maternal care for other known or suspected poor fetal growth, third trimester, other fetus: Secondary | ICD-10-CM | POA: Diagnosis present

## 2016-11-05 DIAGNOSIS — O365930 Maternal care for other known or suspected poor fetal growth, third trimester, not applicable or unspecified: Secondary | ICD-10-CM | POA: Diagnosis present

## 2016-11-05 DIAGNOSIS — O09523 Supervision of elderly multigravida, third trimester: Secondary | ICD-10-CM | POA: Diagnosis not present

## 2016-11-05 DIAGNOSIS — Z3A36 36 weeks gestation of pregnancy: Secondary | ICD-10-CM

## 2016-11-05 DIAGNOSIS — O99213 Obesity complicating pregnancy, third trimester: Secondary | ICD-10-CM | POA: Diagnosis present

## 2016-11-05 DIAGNOSIS — O10013 Pre-existing essential hypertension complicating pregnancy, third trimester: Secondary | ICD-10-CM | POA: Diagnosis present

## 2016-11-05 DIAGNOSIS — O4103X0 Oligohydramnios, third trimester, not applicable or unspecified: Secondary | ICD-10-CM | POA: Diagnosis present

## 2016-11-05 DIAGNOSIS — Z87891 Personal history of nicotine dependence: Secondary | ICD-10-CM | POA: Diagnosis not present

## 2016-11-05 DIAGNOSIS — Z3689 Encounter for other specified antenatal screening: Secondary | ICD-10-CM

## 2016-11-05 DIAGNOSIS — Z6841 Body Mass Index (BMI) 40.0 and over, adult: Secondary | ICD-10-CM

## 2016-11-05 DIAGNOSIS — O10919 Unspecified pre-existing hypertension complicating pregnancy, unspecified trimester: Secondary | ICD-10-CM

## 2016-11-05 DIAGNOSIS — O368130 Decreased fetal movements, third trimester, not applicable or unspecified: Secondary | ICD-10-CM

## 2016-11-05 LAB — TYPE AND SCREEN
ABO/RH(D): O POS
ANTIBODY SCREEN: NEGATIVE

## 2016-11-05 LAB — CBC
HEMATOCRIT: 38.3 % (ref 36.0–46.0)
HEMOGLOBIN: 13 g/dL (ref 12.0–15.0)
MCH: 28.3 pg (ref 26.0–34.0)
MCHC: 33.9 g/dL (ref 30.0–36.0)
MCV: 83.3 fL (ref 78.0–100.0)
Platelets: 193 10*3/uL (ref 150–400)
RBC: 4.6 MIL/uL (ref 3.87–5.11)
RDW: 15.1 % (ref 11.5–15.5)
WBC: 6.2 10*3/uL (ref 4.0–10.5)

## 2016-11-05 LAB — CREATININE, SERUM: Creatinine, Ser: 0.51 mg/dL (ref 0.44–1.00)

## 2016-11-05 MED ORDER — ENOXAPARIN SODIUM 80 MG/0.8ML ~~LOC~~ SOLN
80.0000 mg | SUBCUTANEOUS | Status: DC
  Administered 2016-11-05: 80 mg via SUBCUTANEOUS
  Filled 2016-11-05 (×2): qty 0.8

## 2016-11-05 MED ORDER — ACETAMINOPHEN 325 MG PO TABS: 650.0000 mg | ORAL_TABLET | ORAL | Status: DC | PRN

## 2016-11-05 MED ORDER — OXYTOCIN BOLUS FROM INFUSION: 500.0000 mL | Freq: Once | INTRAVENOUS | Status: DC

## 2016-11-05 MED ORDER — ONDANSETRON HCL 4 MG/2ML IJ SOLN: 4.0000 mg | Freq: Four times a day (QID) | INTRAMUSCULAR | Status: DC | PRN

## 2016-11-05 MED ORDER — LACTATED RINGERS IV SOLN: 500.0000 mL | INTRAVENOUS | Status: DC | PRN

## 2016-11-05 MED ORDER — LACTATED RINGERS IV SOLN
INTRAVENOUS | Status: DC
  Administered 2016-11-05: 23:00:00 via INTRAVENOUS

## 2016-11-05 MED ORDER — OXYTOCIN 40 UNITS IN LACTATED RINGERS INFUSION - SIMPLE MED: 2.5000 [IU]/h | INTRAVENOUS | Status: DC

## 2016-11-05 MED ORDER — PRENATAL MULTIVITAMIN CH
1.0000 | ORAL_TABLET | Freq: Every day | ORAL | Status: DC
  Administered 2016-11-06: 1 via ORAL
  Filled 2016-11-05: qty 1

## 2016-11-05 MED ORDER — OXYCODONE-ACETAMINOPHEN 5-325 MG PO TABS: 1.0000 | ORAL_TABLET | ORAL | Status: DC | PRN

## 2016-11-05 MED ORDER — DOCUSATE SODIUM 100 MG PO CAPS
100.0000 mg | ORAL_CAPSULE | Freq: Every day | ORAL | Status: DC
  Filled 2016-11-05: qty 1

## 2016-11-05 MED ORDER — ENOXAPARIN SODIUM 40 MG/0.4ML ~~LOC~~ SOLN: 40.0000 mg | SUBCUTANEOUS | Status: DC

## 2016-11-05 MED ORDER — OXYCODONE-ACETAMINOPHEN 5-325 MG PO TABS: 2.0000 | ORAL_TABLET | ORAL | Status: DC | PRN

## 2016-11-05 MED ORDER — LIDOCAINE HCL (PF) 1 % IJ SOLN
30.0000 mL | INTRAMUSCULAR | Status: DC | PRN
Start: 2016-11-05 — End: 2016-11-06
  Filled 2016-11-05: qty 30

## 2016-11-05 MED ORDER — CALCIUM CARBONATE ANTACID 500 MG PO CHEW: 2.0000 | CHEWABLE_TABLET | ORAL | Status: DC | PRN

## 2016-11-05 MED ORDER — SOD CITRATE-CITRIC ACID 500-334 MG/5ML PO SOLN: 30.0000 mL | ORAL | Status: DC | PRN

## 2016-11-05 MED ORDER — ZOLPIDEM TARTRATE 5 MG PO TABS: 5.0000 mg | ORAL_TABLET | Freq: Every evening | ORAL | Status: DC | PRN

## 2016-11-05 NOTE — H&P (Signed)
ANTEPARTUM ADMISSION HISTORY AND PHYSICAL NOTE   History of Present Illness: AISHIA BARKEY is a 36 y.o. G2P0100 at [redacted]w[redacted]d admitted for [facility] 2/8.    Patient was seen in MFM for BPP and found to have 2/8 BPP (2 points only for MVP >2cm). Was sent initially to L&D for IOL. While on L&D, patient had a reactive NST and was feeling gross fetal movement. Patient states she had felt baby move just prior to BPP, but during [place] for BPP, she states baby was not moving. Immediately after BPP, patient felt movement and has since.   Patient reports the fetal movement as active. Patient reports uterine contraction  activity as none. Patient reports  vaginal bleeding as none. Patient describes fluid per vagina as None. Fetal presentation is cephalic.  Patient has known IUGR and oligohydramnios. She states she was diagnosed early in the pregnancy with chronic HTN, but was never diagnosed previously with HTN, and during the entire pregnancy (aside from initial visit), had normal BPs, never on meds.    Patient Active Problem List   Diagnosis Date Noted  . IUGR (intrauterine growth restriction) affecting care of mother, third trimester, other fetus 11/05/2016  . BMI 60.0-69.9, adult ([place]) 05/04/2016  . Obesity affecting pregnancy in first trimester 05/04/2016  . Chronic hypertension in obstetric context, third trimester 05/04/2016  . Supervision of high risk pregnancy in first trimester 05/04/2016  . History of preterm delivery 08/20/2015  . History of cervical cerclage 08/20/2015  . Health care maintenance 11/01/2014  . PCOS (polycystic ovarian syndrome) 02/08/2013    Past Medical History:  Diagnosis Date  . 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 Diverticulitis   . Heart palpitations   . Morbid obesity with BMI of 50.0-59.9, adult (HCC)    BMI 58.8  . PCOS (polycystic ovarian syndrome)   . Seizure [facility])    as a child    Past Surgical History:  Procedure  Laterality Date  . CERVICAL CERCLAGE  03/13/2012   Procedure: CERCLAGE CERVICAL;  Surgeon: Jonnie Kind, MD;  Location: [facility];  Service: Gynecology;  Laterality: N/A;  . CERVICAL CERCLAGE  03/17/2012   Procedure: CERCLAGE CERVICAL;  Surgeon: Osborne Oman, MD;  Location: [facility];  Service: Gynecology;  Laterality: N/A;  Removal    OB History  Gravida Para Term Preterm AB Living  2 1 0 1 0 0  SAB TAB Ectopic Multiple Live Births  0 0 0 0 1    # Outcome Date GA Lbr Len/2nd Weight Sex Delivery Anes PTL Lv  2 Current           1 Preterm 03/17/12 [redacted]w[redacted]d 00:41 / 00:07 11.6 oz (0.329 kg) M Vag-Spont Spinal, Other  ND      Social History   Social History  . Marital status: Married    Spouse name: N/A  . Number of children: N/A  . Years of education: N/A   Social History Main Topics  . Smoking status: Former Smoker    Types: Cigarettes    Quit date: 02/08/2009  . Smokeless tobacco: Never Used  . Alcohol use No  . Drug use: No  . Sexual activity: Yes    Birth control/ protection: None   Other Topics Concern  . None   Social History Narrative  . None    Family History  Problem Relation Age of Onset  . Cancer Mother        lung  . Hypertension Mother   .  Diabetes Father   . Heart failure Father   . Diabetes Paternal Grandmother   . Hypertension Sister   . Hypertension Brother   . Rashes / Skin problems Sister   . Other Neg Hx     No Known Allergies  Prescriptions Prior to Admission  Medication Sig Dispense Refill Last Dose  . hydroxyprogesterone caproate (MAKENA) 250 mg/mL OIL injection Inject 250 mg into the muscle once a week.   10/28/2016 at Unknown time  . Prenatal Multivit-Min-Fe-FA (PRENATAL VITAMINS) 0.8 MG tablet Take 1 tablet by mouth daily. 30 tablet 12 11/04/2016 at Unknown time    Review of Systems - Negative except per HPI  Vitals:  BP (!) 155/85   Pulse 83   Temp 98 F (36.7 C) (Oral)   Resp 18   Ht 5\' 3"  (1.6 m)   Wt (!) 357 lb (161.9 kg)    LMP 02/24/2016 (Approximate)   BMI 63.24 kg/m  Physical Examination: CONSTITUTIONAL: Well-developed, well-nourished, severely morbidly obese female in no acute distress.  HEENT:  Normocephalic, atraumatic. Conjunctivae and EOM are normal. No scleral icterus.  NECK: Normal range of motion, supple, no masses SKIN: Skin is warm and dry. No rash noted. Not diaphoretic. No erythema.  [place]: Alert and oriented to person, place, and time. No gross cranial nerve deficit noted. PSYCHIATRIC: Normal mood and affect. Normal behavior. Normal judgment and thought content. CARDIOVASCULAR: Normal heart rate noted, regular rhythm RESPIRATORY: Effort and breath sounds normal, no problems with respiration noted ABDOMEN: Soft, nontender, nondistended, gravid size limited by body habitus. Morbidly obese abdomen. MUSCULOSKELETAL: Normal range of motion. No edema and no tenderness. 2+ distal pulses.  Cervix: Not evaluated. Membranes:intact Fetal Monitoring:140 bpm, mod var, +accels, no decels Tocometer: Flat  Labs:  Results for orders placed or performed during the hospital encounter of 11/05/16 (from the past 24 hour(s))  CBC   Collection Time: 11/05/16  3:00 PM  Result Value Ref Range   WBC 6.2 4.0 - 10.5 K/uL   RBC 4.60 3.87 - 5.11 MIL/uL   Hemoglobin 13.0 12.0 - 15.0 g/dL   HCT 38.3 36.0 - 46.0 %   MCV 83.3 78.0 - 100.0 fL   MCH 28.3 26.0 - 34.0 pg   MCHC 33.9 30.0 - 36.0 g/dL   RDW 15.1 11.5 - 15.5 %   Platelets 193 150 - 400 K/uL  Type and screen [place]   Collection Time: 11/05/16  3:00 PM  Result Value Ref Range   ABO/RH(D) O POS    Antibody Screen NEG    Sample Expiration 11/08/2016     Imaging Studies: [place] Mfm Fetal Bpp Wo Non Stress  Result Date: 11/05/2016 ----------------------------------------------------------------------  OBSTETRICS REPORT                      (Signed Final 11/05/2016 02:23 pm)  ---------------------------------------------------------------------- Patient Info  ID #:       [id number]                          D.O.B.:  03-Aug-1980 (36 yrs)  Name:       [name]                 Visit Date: 11/05/2016 01:03 pm ---------------------------------------------------------------------- Performed By  Performed By:     Ulyses Jarred        Secondary Phy.:   [facility]  [place] for                                                             [place]  Attending:        [facility]        Address:          [facility]                    MD                                                             [facility]                                                             [address]                                                             [place], [place]  Referred By:      Woodroe Mode         Location:         [facility]                    MD  Ref. Address:  [place], [place] ---------------------------------------------------------------------- Orders   #  Description                                 Code   1  [place] MFM FETAL BPP WO NON STRESS              76819.01   2  [place] MFM UA CORD DOPPLER                      [phone]  ----------------------------------------------------------------------   #  Ordered By               Order #        Accession #    Episode #   1  [place]            [id number]      [id number]     [id number]   2  [place]            [id number]      [id number]     [id number]  ---------------------------------------------------------------------- Indications   [redacted] weeks gestation of  pregnancy                Z3A.36   Poor obstetric history: Previous neonatal      O09.299   death   Poor obstetric history: Previous preterm       O09.219   delivery (21-22 wks following rescue   cerclage), 17P   Hypertension - Chronic/Pre-existing-ASA        O66.019   Advanced maternal age multigravida 55+,        O78.523   third trimester (low risk FTS)   Maternal care for known or suspected poor      O36.5930   fetal growth, third trimester, not applicable or   unspecified  ---------------------------------------------------------------------- OB History  Blood Type:            Height:  5'4"   Weight (lb):  343       BMI:  58.87  Gravidity:    2         Term:   0        Prem:   1        SAB:   0  TOP:          0       Ectopic:  0        Living: 0 ---------------------------------------------------------------------- Fetal Evaluation  Num Of Fetuses:     1  Fetal Heart         132  Rate(bpm):  Cardiac Activity:   Observed  Presentation:       Cephalic  Placenta:           Right lateral, above cervical os  Amniotic Fluid  AFI FV:      Oligohydramnios  AFI Sum(cm)     %Tile       Largest Pocket(cm)  6.87            3  3.08  RUQ(cm)       RLQ(cm)       LUQ(cm)  3.08          1.63          2.16 ---------------------------------------------------------------------- Biophysical Evaluation  Amniotic F.V:   Pocket => 2 cm two         F. Tone:        Not Observed                  planes  F. Movement:    Not Observed               Score:          2/8  F. Breathing:   Not Observed ---------------------------------------------------------------------- Gestational Age  LMP:           36w 3d        Date:  02/24/16                 EDD:   11/30/16  Best:          Harolyn Rutherford 3d     Det. By:  LMP  (02/24/16)          EDD:   11/30/16 ---------------------------------------------------------------------- Doppler - Fetal Vessels  Umbilical Artery   S/D     %tile     RI              PI              PSV                                                    (cm/s)   3.8    > 97.5    0.7            1.13              58.1 ---------------------------------------------------------------------- Impression  Single IUP at 36w 3d  Suspected fetal growth restriction (EFW < 10th %tile on 7/10)  BPP 2/8 (-2 for breathing, tone and movement)  Decreased amniotic fluid volume noted with an AFI of 6.8 cm  UA Doppler studies: elevated S/D ratio without AEDF or  REDF ---------------------------------------------------------------------- Recommendations  Patient was transferred directly to L&D for further evaluation  and delivery.  Discussed case with Dr. Roselie Awkward. ----------------------------------------------------------------------                Kerry Kass, MD Electronically Signed Final Report   11/05/2016 02:23 pm ----------------------------------------------------------------------  [place] Mfm Fetal Bpp Wo Non Stress  Result Date: 11/02/2016 ----------------------------------------------------------------------  OBSTETRICS REPORT                      (Signed Final 11/02/2016 05:04 pm) ---------------------------------------------------------------------- Patient Info  ID #:       [id number]                         D.O.B.:   15-Apr-1981 (36 yrs)  Name:       [name]                Visit Date:  11/02/2016 04:11 pm ---------------------------------------------------------------------- Performed By  Performed By:     Rodrigo Ran BS      Secondary Phy.:   [facility]  [facility]                                                             [facility]                                                             [facility]  Attending:        Wende Mott MD     Address:          [facility]                                                             [address]                                                             [place], [place]  Referred By:      Woodroe Mode         Location:         [facility]                    MD  Ref. Address:     [address]  [place], [place] ---------------------------------------------------------------------- Orders   #  Description                                 Code   1  [place] MFM OB FOLLOW UP                         [phone]   2  [place] MFM FETAL BPP WO NON STRESS              76819.01   3  [place] MFM UA CORD DOPPLER                      76820.02  ----------------------------------------------------------------------   #  Ordered By               Order #        Accession #    Episode #   [place]           [id number]      [id number]     [id number]   2  [place]           [id number]      [id number]     [id number]   3  [place]           [id number]      [id number]     [id number]  ---------------------------------------------------------------------- Indications   [redacted] weeks gestation of pregnancy                Z3A.36   Poor obstetric history: Previous neonatal      O09.299   death   Poor obstetric history: Previous preterm       O09.219   delivery (21-22 wks following rescue   cerclage), 17P   Hypertension - Chronic/Pre-existing-ASA        O13.019   Advanced maternal age multigravida 31+,        O75.523   third trimester (low risk FTS)   Maternal morbid obesity                        O99.210 E66.01   Maternal care for known or suspected poor      O36.5930   fetal growth, third trimester, not applicable or   unspecified  ---------------------------------------------------------------------- OB History  Blood Type:            Height:  5'4"   Weight (lb):  343      BMI:   58.87  Gravidity:    2         Term:   0        Prem:   1        SAB:   0  TOP:          0        Ectopic:  0        Living: 0 ---------------------------------------------------------------------- Fetal Evaluation  Num Of Fetuses:     1  Fetal Heart         145  Rate(bpm):  Cardiac Activity:   Observed  Presentation:  Breech  Placenta:           Fundal, above cervical os  Amniotic Fluid  AFI FV:      Subjectively within normal limits  AFI Sum(cm)     %Tile       Largest Pocket(cm)  11.94           36          5.12  RUQ(cm)       RLQ(cm)       LUQ(cm)        LLQ(cm)  5.12          1.99          2.76           2.07 ---------------------------------------------------------------------- Biophysical Evaluation  Amniotic F.V:   Within normal limits       F. Tone:        Observed  F. Movement:    Observed                   Score:          8/8  F. Breathing:   Observed ---------------------------------------------------------------------- Biometry  BPD:      86.6  mm     G. Age:  35w 0d         28  %    CI:        69.96   %   70 - 86                                                          FL/HC:      18.2   %   20.1 - 22.1  HC:      330.3  mm     G. Age:  37w 4d         59  %    HC/AC:      1.12       0.93 - 1.11  AC:      295.3  mm     G. Age:  33w 4d          6  %    FL/BPD:     69.3   %   71 - 87  FL:         60  mm     G. Age:  31w 1d        < 3  %    FL/AC:      20.3   %   20 - 24  HUM:      52.5  mm     G. Age:  30w 4d        < 5  %  Est. FW:    2183  gm    4 lb 13 oz    < 10  % ---------------------------------------------------------------------- Gestational Age  LMP:           36w 0d       Date:   02/24/16                 EDD:   11/30/16  U/S Today:     34w 2d  EDD:   12/12/16  Best:          36w 0d    Det. By:   LMP  (02/24/16)          EDD:   11/30/16 ---------------------------------------------------------------------- Anatomy  Cranium:               Appears normal         Aortic Arch:            Previously seen  Cavum:                 Previously seen         Ductal Arch:            Previously seen  Ventricles:            Previously seen        Diaphragm:              Previously seen  Choroid Plexus:        Previously seen        Stomach:                Appears normal, left                                                                        sided  Cerebellum:            Previously seen        Abdomen:                Previously seen  Posterior Fossa:       Previously seen        Abdominal Wall:         Previously seen  Nuchal Fold:           Previously seen        Cord Vessels:           Previously seen  Face:                  Orbits and profile     Kidneys:                Appear normal                         previously seen  Lips:                  Previously seen        Bladder:                Appears normal  Thoracic:              Appears normal         Spine:                  Previously seen                         Previously seen  Heart:                 Previously seen        Upper Extremities:  Previously seen  RVOT:                  Previously seen        Lower Extremities:      Previously seen  LVOT:                  Previously seen  Other:  Fetus appears to be a female. Heels previously visualized. Open right          hands previously visualized. Technically difficult due to maternal          habitus and fetal position. ---------------------------------------------------------------------- Doppler - Fetal Vessels  Umbilical Artery   S/D     %tile                                            ADFV    RDFV   3.9    > 97.5                                               No      No ---------------------------------------------------------------------- Cervix Uterus Adnexa  Cervix  Not visualized (advanced GA >29wks)  Uterus  No abnormality visualized.  Left Ovary  Not visualized.  Right Ovary  Not visualized.  Cul De Sac:   No free fluid seen.  Adnexa:       No abnormality visualized. ---------------------------------------------------------------------- Impression   Indication: 36 yr old G2P0100 at [redacted]w[redacted]d with chronic  hypertension and morbid obesity for fetal growth and BPP.  Findings:  1. Single intrauterine pregnancy.  2. Estimated fetal weight is in the <10th%.  3. Fundal placenta without evidence of previa.  4. Normal amniotic fluid index.  5. The limited anatomy survey is normal.  6. Elevated systolic/diastolic ratio on umbilical artery Doppler  studies; there is persistent forward flow.  7. Normal biophysical profile of 8/8.  8. Fetus is in breech presentation. ---------------------------------------------------------------------- Recommendations  1. Fetal growth restriction:  - discussed concern for placental insufficiency given elevated  Doppler studies  - discussed association with increased risk of fetal demise  and need for preterm delivery  - measurements were difficult to obtain given maternal body  habitus and may not be fully representative of fetal weight;  however given entire clinical picture recommend manage  conservatively  - recommend fetal kick counts- discussed with patient  - recommend follow up in 2-3 days for BPP and repeat  Doppler studies  - if Doppler studies remain elevated recommend delivery by  37 weeks  2. Hypertension:  - previously counseled  - no medications  - recommend monitor for preeclampsia  - fetal surveillance as above  3. Previous 21 week PPROM:  - previously counseled  - was on makena  4. Advanced maternal age:  - low risk first trimester screen and MSAFP  Discussed recommendations with Dr. Nehemiah Settle ----------------------------------------------------------------------                Wende Mott, MD Electronically Signed Final Report   11/02/2016 05:04 pm ----------------------------------------------------------------------  [place] Mfm Fetal Bpp Wo Non Stress  Result Date: 10/26/2016 ----------------------------------------------------------------------  OBSTETRICS REPORT                      (Signed  Final 10/26/2016 02:09 pm)  ---------------------------------------------------------------------- Patient Info  ID #:       [id number]                          D.O.B.:  [birth year] (36 yrs)  Name:       MALIYA MARICH                 Visit Date: 10/26/2016 01:31 pm ---------------------------------------------------------------------- Performed By  Performed By:     Raquel James         Secondary Phy.:   [facility]                                                             [place]  Attending:        Griffin Dakin MD         Address:          [facility]                                                             [place], [place]  [address]  Referred By:      Woodroe Mode         Location:         [facility]                    MD  Ref. Address:     [place], [place] ---------------------------------------------------------------------- Orders   #  Description                                 Code   1  [place] MFM FETAL BPP WO NON STRESS              95638.75  ----------------------------------------------------------------------   #  Ordered By               Order #        Accession #    Episode #   [place]           [id number]      [id number]     [id number]  ---------------------------------------------------------------------- Indications   [redacted] weeks gestation of pregnancy                Z3A.35   Poor obstetric history: Previous neonatal      O09.299   death   Poor obstetric history: Previous preterm       O09.219    delivery (21-22 wks following rescue   cerclage), 17P   Hypertension - Chronic/Pre-existing-ASA        O58.019   Advanced maternal age multigravida 4+,        O4.523   third trimester (low risk FTS)   Maternal morbid obesity                        O99.210 E66.01  ---------------------------------------------------------------------- OB History  Blood Type:            Height:  5'4"   Weight (lb):  343       BMI:  58.87  Gravidity:    2         Term:   0        Prem:   1        SAB:   0  TOP:          0       Ectopic:  0        Living: 0 ---------------------------------------------------------------------- Fetal Evaluation  Num Of Fetuses:     1  Fetal Heart         140  Rate(bpm):  Cardiac Activity:   Observed  Presentation:       Breech  Amniotic Fluid  AFI FV:      Subjectively within normal limits  AFI Sum(cm)     %Tile       Largest Pocket(cm)  10.01           21          3.89  RUQ(cm)       RLQ(cm)  LUQ(cm)        LLQ(cm)  2.03          1.51          2.58           3.89 ---------------------------------------------------------------------- Biophysical Evaluation  Amniotic F.V:   Within normal limits       F. Tone:        Observed  F. Movement:    Observed                   Score:          8/8  F. Breathing:   Observed ---------------------------------------------------------------------- Gestational Age  LMP:           35w 0d        Date:  02/24/16                 EDD:   11/30/16  Best:          Barbie Haggis 0d     Det. By:  LMP  (02/24/16)          EDD:   11/30/16 ---------------------------------------------------------------------- Impression  Single IUP at 35+0 weeks with [facility]  Breech presentation  BPP 8/8  Normal amniotic fluid volume ---------------------------------------------------------------------- Recommendations  Continue antenatal testing and serial ultrasounds for growth  as scheduled ----------------------------------------------------------------------                 Griffin Dakin, MD  Electronically Signed Final Report   10/26/2016 02:09 pm ----------------------------------------------------------------------  [place] Mfm Fetal Bpp Wo Non Stress  Result Date: 10/19/2016 ----------------------------------------------------------------------  OBSTETRICS REPORT                      (Signed Final 10/19/2016 03:41 pm) ---------------------------------------------------------------------- Patient Info  ID #:       [id number]                         D.O.B.:   1980/07/12 (36 yrs)  Name:       [name]                Visit Date:  10/19/2016 02:57 pm ---------------------------------------------------------------------- Performed By  Performed By:     Novella Rob        Secondary Phy.:   [facility] for                                                             [facility]  [facility]care  Attending:        Griffin Dakin MD         Address:          [facility]                                                             [place]                                                             [place], [place]  Referred By:      Woodroe Mode         Location:         [facility]                    MD  Ref. Address:     [place], [place] ---------------------------------------------------------------------- Orders   #  Description                                 Code   1  [place] MFM FETAL BPP WO NON STRESS              98338.25  ----------------------------------------------------------------------   #  Ordered By               Order #        Accession #    Episode #    1  Kerry Hough             [id number]      [id number]     [id number]  ---------------------------------------------------------------------- Indications  [redacted] weeks gestation of pregnancy                Z3A.34   Poor obstetric history: Previous neonatal      O09.299   death   Poor obstetric history: Previous preterm       O09.219   delivery (21-22 wks following rescue   cerclage), 17P   Hypertension - Chronic/Pre-existing-ASA        O26.019   Advanced maternal age multigravida 89+,        O37.523   third trimester (low risk FTS)   Maternal morbid obesity                        O99.210 E66.01  ---------------------------------------------------------------------- OB History  Blood Type:            Height:  5'4"   Weight (lb):  343      BMI:   58.87  Gravidity:    2         Term:   0        Prem:   1        SAB:   0  TOP:          0       Ectopic:  0        Living: 0 ---------------------------------------------------------------------- Fetal Evaluation  Num Of Fetuses:     1  Fetal Heart         135  Rate(bpm):  Cardiac Activity:   Observed  Presentation:       Breech  Placenta:           Fundal, above cervical os  Amniotic Fluid  AFI FV:      Subjectively within normal limits  AFI Sum(cm)     %Tile       Largest Pocket(cm)  18.21           67          5.91  RUQ(cm)       RLQ(cm)       LUQ(cm)        LLQ(cm)  5.91          3.38          3.8            5.12 ---------------------------------------------------------------------- Biophysical Evaluation  Amniotic F.V:   Pocket => 2 cm two         F. Tone:        Observed                  planes  F. Movement:    Observed                   Score:          8/8  F. Breathing:   Observed ---------------------------------------------------------------------- Gestational Age  LMP:           34w 0d       Date:   02/24/16                 EDD:   11/30/16  Best:          34w 0d    Det. By:   LMP  (02/24/16)          EDD:   11/30/16  ---------------------------------------------------------------------- Impression  Single IUP at 34+0 weeks with [facility]  Breech presentation  BPP 8/8  Normal amniotic fluid volume ---------------------------------------------------------------------- Recommendations  Continue antenatal testing and serial ultrasounds for growth  as scheduled ----------------------------------------------------------------------                 Griffin Dakin, MD Electronically Signed Final Report   10/19/2016 03:41 pm ----------------------------------------------------------------------  [place] Mfm Fetal Bpp Wo Non Stress  Result Date: 10/12/2016 ----------------------------------------------------------------------  OBSTETRICS REPORT                      (Signed Final 10/12/2016 03:59 pm) ---------------------------------------------------------------------- Patient Info  ID #:       [id number]                         D.O.B.:   02/28/1981 (36 yrs)  Name:       [name]                Visit Date:  10/12/2016 03:20 pm ---------------------------------------------------------------------- Performed By  Performed By:     Rodrigo Ran BS      Secondary Phy.:   [place] for                                                             [place]  Attending:        Kerry Kass        Address:          [facility]                    MD                                                             [facility]                                                             [address]  [place], [place]  Referred By:      Wood[place] Mode         Location:         [facility]                     MD  Ref. Address:     [place], [place] ---------------------------------------------------------------------- Orders   #  Description                                 Code   1  [place] MFM FETAL BPP WO NON STRESS              [phone]  ----------------------------------------------------------------------   #  Ordered By               Order #        Accession #    Episode #   1  Kerry Hough             [id number]      [id number]     [id number]  ---------------------------------------------------------------------- Indications   [redacted] weeks gestation of pregnancy                Z3A.33   Poor obstetric history: Previous neonatal      O09.299   death   Poor obstetric history: Previous preterm       O09.219   delivery (21-22 wks following rescue   cerclage), 17P   Hypertension - Chronic/Pre-existing-ASA        O83.019   Advanced maternal age multigravida 50+,        O72.523   third trimester (low risk FTS)   Maternal morbid obesity                        O99.210 E66.01  ---------------------------------------------------------------------- OB History  Blood Type:            Height:  5'4"   Weight (lb):  343      BMI:   58.87  Gravidity:    2         Term:   0        Prem:   1        SAB:   0  TOP:          0  Ectopic:  0        Living: 0 ---------------------------------------------------------------------- Fetal Evaluation  Num Of Fetuses:     1  Fetal Heart         143  Rate(bpm):  Cardiac Activity:   Observed  Presentation:       Breech  Amniotic Fluid  AFI FV:      Subjectively within normal limits  AFI Sum(cm)     %Tile       Largest Pocket(cm)  11.16           26          3.42  RUQ(cm)       RLQ(cm)       LUQ(cm)        LLQ(cm)  3.17          2.15          2.42           3.42 ---------------------------------------------------------------------- Biophysical Evaluation  Amniotic F.V:   Within normal limits       F. Tone:         Observed  F. Movement:    Observed                   Score:          8/8  F. Breathing:   Observed ---------------------------------------------------------------------- Gestational Age  LMP:           33w 0d       Date:   02/24/16                 EDD:   11/30/16  Best:          33w 0d    Det. By:   LMP  (02/24/16)          EDD:   11/30/16 ---------------------------------------------------------------------- Impression  Single IUP at 33w 0d  Breech presentation  BPP 8/8  Normal amniotic fluid volume ---------------------------------------------------------------------- Recommendations  Continue antenatal testing and serial ultrasounds for growth  as scheduled ----------------------------------------------------------------------                Kerry Kass, MD Electronically Signed Final Report   10/12/2016 03:59 pm ----------------------------------------------------------------------  [place] Mfm Ob Follow Up  Result Date: 11/02/2016 ----------------------------------------------------------------------  OBSTETRICS REPORT                      (Signed Final 11/02/2016 05:04 pm) ---------------------------------------------------------------------- Patient Info  ID #:       [id number]                         D.O.B.:   [birth year] (36 yrs)  Name:       [name]                Visit Date:  11/02/2016 04:11 pm ---------------------------------------------------------------------- Performed By  Performed By:     Rodrigo Ran BS      Secondary Phy.:   [facility]                    [facility]  Attending:        Wende Mott MD     Address:          [facility]                                                              [place]                                                             [place], [place]  Referred By:      Woodroe Mode         Location:         [facility]                    MD  Ref. Address:     [place], [place] ---------------------------------------------------------------------- Orders   #  Description  Code   1  [place] MFM OB FOLLOW UP                         [id number]   2  [place] MFM FETAL BPP WO NON STRESS              76819.01   3  [place] MFM UA CORD DOPPLER                      76820.02  ----------------------------------------------------------------------   #  Ordered By               Order #        Accession #    Episode #   [place]           [id number]      [id number]     [id number]   2  [place]           [id number]      [id number]     [id number]   3  [place]           [id number]      [id number]     [id number]  ---------------------------------------------------------------------- Indications   [redacted] weeks gestation of pregnancy                Z3A.36   Poor obstetric history: Previous neonatal      O09.299   death   Poor obstetric history: Previous preterm       O09.219   delivery (21-22 wks following rescue   cerclage), 17P   Hypertension - Chronic/Pre-existing-ASA        O38.019   Advanced maternal age multigravida 4+,        O41.523   third trimester (low risk FTS)   Maternal morbid obesity                        O99.210 E66.01   Maternal care for known or suspected poor      O36.5930   fetal growth, third trimester, not applicable or   unspecified  ---------------------------------------------------------------------- OB History  Blood Type:            Height:  5'4"   Weight (lb):  343      BMI:   58.87  Gravidity:    2         Term:   0         Prem:   1        SAB:   0  TOP:          0       Ectopic:  0        Living: 0 ---------------------------------------------------------------------- Fetal Evaluation  Num Of Fetuses:     1  Fetal Heart         145  Rate(bpm):  Cardiac Activity:   Observed  Presentation:       Breech  Placenta:           Fundal, above cervical os  Amniotic Fluid  AFI FV:      Subjectively within normal limits  AFI Sum(cm)     %Tile       Largest Pocket(cm)  11.94           36          5.12  RUQ(cm)       RLQ(cm)  LUQ(cm)        LLQ(cm)  5.12          1.99          2.76           2.07 ---------------------------------------------------------------------- Biophysical Evaluation  Amniotic F.V:   Within normal limits       F. Tone:        Observed  F. Movement:    Observed                   Score:          8/8  F. Breathing:   Observed ---------------------------------------------------------------------- Biometry  BPD:      86.6  mm     G. Age:  35w 0d         28  %    CI:        69.96   %   70 - 86                                                          FL/HC:      18.2   %   20.1 - 22.1  HC:      330.3  mm     G. Age:  37w 4d         59  %    HC/AC:      1.12       0.93 - 1.11  AC:      295.3  mm     G. Age:  33w 4d          6  %    FL/BPD:     69.3   %   71 - 87  FL:         60  mm     G. Age:  31w 1d        < 3  %    FL/AC:      20.3   %   20 - 24  HUM:      52.5  mm     G. Age:  30w 4d        < 5  %  Est. FW:    2183  gm    4 lb 13 oz    < 10  % ---------------------------------------------------------------------- Gestational Age  LMP:           36w 0d       Date:   02/24/16                 EDD:   11/30/16  U/S Today:     34w 2d                                        EDD:   12/12/16  Best:          36w 0d    Det. By:   LMP  (02/24/16)          EDD:   11/30/16 ---------------------------------------------------------------------- Anatomy  Cranium:               Appears normal         Aortic  Arch:            Previously seen   Cavum:                 Previously seen        Ductal Arch:            Previously seen  Ventricles:            Previously seen        Diaphragm:              Previously seen  Choroid Plexus:        Previously seen        Stomach:                Appears normal, left                                                                        sided  Cerebellum:            Previously seen        Abdomen:                Previously seen  Posterior Fossa:       Previously seen        Abdominal Wall:         Previously seen  Nuchal Fold:           Previously seen        Cord Vessels:           Previously seen  Face:                  Orbits and profile     Kidneys:                Appear normal                         previously seen  Lips:                  Previously seen        Bladder:                Appears normal  Thoracic:              Appears normal         Spine:                  Previously seen                         Previously seen  Heart:                 Previously seen        Upper Extremities:      Previously seen  RVOT:                  Previously seen        Lower Extremities:      Previously seen  LVOT:                  Previously seen  Other:  Fetus appears to be a  female. Heels previously visualized. Open right          hands previously visualized. Technically difficult due to maternal          habitus and fetal position. ---------------------------------------------------------------------- Doppler - Fetal Vessels  Umbilical Artery   S/D     %tile                                            ADFV    RDFV   3.9    > 97.5                                               No      No ---------------------------------------------------------------------- Cervix Uterus Adnexa  Cervix  Not visualized (advanced GA >29wks)  Uterus  No abnormality visualized.  Left Ovary  Not visualized.  Right Ovary  Not visualized.  Cul De Sac:   No free fluid seen.  Adnexa:       No abnormality visualized.  ---------------------------------------------------------------------- Impression  Indication: 36 yr old G2P0100 at [redacted]w[redacted]d with chronic  hypertension and morbid obesity for fetal growth and BPP.  Findings:  1. Single intrauterine pregnancy.  2. Estimated fetal weight is in the <10th%.  3. Fundal placenta without evidence of previa.  4. Normal amniotic fluid index.  5. The limited anatomy survey is normal.  6. Elevated systolic/diastolic ratio on umbilical artery Doppler  studies; there is persistent forward flow.  7. Normal biophysical profile of 8/8.  8. Fetus is in breech presentation. ---------------------------------------------------------------------- Recommendations  1. Fetal growth restriction:  - discussed concern for placental insufficiency given elevated  Doppler studies  - discussed association with increased risk of fetal demise  and need for preterm delivery  - measurements were difficult to obtain given maternal body  habitus and may not be fully representative of fetal weight;  however given entire clinical picture recommend manage  conservatively  - recommend fetal kick counts- discussed with patient  - recommend follow up in 2-3 days for BPP and repeat  Doppler studies  - if Doppler studies remain elevated recommend delivery by  37 weeks  2. Hypertension:  - previously counseled  - no medications  - recommend monitor for preeclampsia  - fetal surveillance as above  3. Previous 21 week PPROM:  - previously counseled  - was on makena  4. Advanced maternal age:  - low risk first trimester screen and MSAFP  Discussed recommendations with Dr. Nehemiah Settle ----------------------------------------------------------------------                Wende Mott, MD Electronically Signed Final Report   11/02/2016 05:04 pm ----------------------------------------------------------------------  [place] Mfm Ua Cord Doppler  Result Date: 11/05/2016 ----------------------------------------------------------------------   OBSTETRICS REPORT                      (Signed Final 11/05/2016 02:23 pm) ---------------------------------------------------------------------- Patient Info  ID #:       [id number]                          D.O.B.:  1981-02-12 (36 yrs)  Name:       [name]  Visit Date: 11/05/2016 01:03 pm ---------------------------------------------------------------------- Performed By  Performed By:     Lady Gary Phy.:   [place] for                                                             [place]  Attending:        Kerry Kass        Address:          [facility]                    MD                                                             [facility]                                                             [address]                                                             [place], [place]  Referred By:  [place]         Location:         [facility]                    MD  Ref. Address:     [place], [place] ---------------------------------------------------------------------- Orders   #  Description                                 Code   1  [place] MFM FETAL BPP WO NON STRESS              76819.01   2  [place] MFM UA CORD DOPPLER                      [phone]  ----------------------------------------------------------------------   #  Ordered By               Order #        Accession #    Episode #   1  [place]            [id number]      [id number]     [id number]   2  [place]            [id number]      [id number]     [id number]   ---------------------------------------------------------------------- Indications   [redacted] weeks gestation of pregnancy                Z3A.36   Poor obstetric history: Previous neonatal      O09.299   death   Poor obstetric history: Previous preterm       O09.219   delivery (21-22 wks following rescue   cerclage), 17P   Hypertension - Chronic/Pre-existing-ASA        O93.019   Advanced maternal age multigravida 8+,        O38.523   third trimester (low risk FTS)   Maternal care for known or suspected poor      O36.5930   fetal growth, third trimester, not applicable or   unspecified  ---------------------------------------------------------------------- OB History  Blood Type:            Height:  5'4"   Weight (lb):  343       BMI:  58.87  Gravidity:    2         Term:   0        Prem:   1        SAB:   0  TOP:          0       Ectopic:  0        Living: 0 ---------------------------------------------------------------------- Fetal Evaluation  Num Of Fetuses:     1  Fetal Heart         132  Rate(bpm):  Cardiac Activity:   Observed  Presentation:       Cephalic  Placenta:           Right lateral, above cervical os  Amniotic Fluid  AFI FV:      Oligohydramnios  AFI Sum(cm)     %Tile       Largest Pocket(cm)  6.87            3           3.08  RUQ(cm)       RLQ(cm)       LUQ(cm)  3.08          1.63          2.16 ---------------------------------------------------------------------- Biophysical Evaluation  Amniotic F.V:   Pocket => 2 cm two         F. Tone:        Not Observed                  planes  F. Movement:    Not Observed               Score:          2/8  F. Breathing:   Not Observed ---------------------------------------------------------------------- Gestational Age  LMP:           36w 3d        Date:  02/24/16                 EDD:   11/30/16  Best:          Harolyn Rutherford 3d     Det. By:  LMP  (02/24/16)          EDD:   11/30/16 ---------------------------------------------------------------------- Doppler - Fetal Vessels   Umbilical Artery   S/D     %tile     RI              PI              PSV                                                   (cm/s)   3.8    > 97.5    0.7            1.13              58.1 ---------------------------------------------------------------------- Impression  Single IUP at 36w 3d  Suspected fetal growth restriction (EFW < 10th %tile on 7/10)  BPP 2/8 (-2 for breathing, tone and movement)  Decreased amniotic fluid volume noted with an AFI of 6.8 cm  UA Doppler studies: elevated S/D ratio without AEDF or  REDF ---------------------------------------------------------------------- Recommendations  Patient was transferred directly to L&D for further evaluation  and delivery.  Discussed case with Dr. Roselie Awkward. ----------------------------------------------------------------------                Kerry Kass, MD Electronically Signed Final Report   11/05/2016 02:23 pm ----------------------------------------------------------------------  [place] Mfm Ua Cord Doppler  Result Date: 11/02/2016 ----------------------------------------------------------------------  OBSTETRICS REPORT                      (Signed Final 11/02/2016 05:04 pm) ---------------------------------------------------------------------- Patient Info  ID #:       [id number]                         D.O.B.:   Oct 12, 1980 (36 yrs)  Name:       [name]  Visit Date:  11/02/2016 04:11 pm ---------------------------------------------------------------------- Performed By  Performed By:     Rodrigo Ran BS      Secondary Phy.:   [place] for                                                             [facility]                                                             Healthcare  Attending:        Wende Mott MD     Address:          [facility]                                                              [address]                                                             [place], [place]  Referred By:      Wood[place] Mode         Location:  [facility]                    MD  Ref. Address:     [place], [place] ---------------------------------------------------------------------- Orders   #  Description                                 Code   1  [place] MFM OB FOLLOW UP                         45809.98   2  [place] MFM FETAL BPP WO NON STRESS              76819.01   3  [place] MFM UA CORD DOPPLER                      76820.02  ----------------------------------------------------------------------   #  Ordered By               Order #        Accession #    Episode #   1  JENNIFER RASCH           [id number]      [id number]     [id number]   2  [place]           [id number]      [id number]     [id number]   3  [place]           [id number]      [id number]     [id number]  ---------------------------------------------------------------------- Indications   [redacted] weeks gestation of pregnancy                Z3A.36   Poor obstetric history: Previous neonatal      O09.299   death   Poor obstetric history: Previous preterm       O09.219   delivery (21-22 wks following rescue   cerclage), 17P   Hypertension - Chronic/Pre-existing-ASA        O51.019   Advanced maternal age multigravida 68+,        O78.523   third trimester (low risk FTS)   Maternal morbid obesity                        O99.210 E66.01   Maternal care for known or suspected poor      O36.5930   fetal growth, third trimester, not applicable or   unspecified  ---------------------------------------------------------------------- OB History  Blood Type:            Height:  5'4"   Weight (lb):  343       BMI:   58.87  Gravidity:    2         Term:   0        Prem:   1        SAB:   0  TOP:          0       Ectopic:  0        Living: 0 ---------------------------------------------------------------------- Fetal Evaluation  Num Of Fetuses:     1  Fetal Heart         145  Rate(bpm):  Cardiac Activity:   Observed  Presentation:       Breech  Placenta:           Fundal, above cervical os  Amniotic Fluid  AFI FV:      Subjectively within normal limits  AFI Sum(cm)     %Tile       Largest Pocket(cm)  11.94           36          5.12  RUQ(cm)       RLQ(cm)       LUQ(cm)        LLQ(cm)  5.12          1.99          2.76           2.07 ---------------------------------------------------------------------- Biophysical Evaluation  Amniotic F.V:   Within normal limits       F. Tone:        Observed  F. Movement:    Observed                   Score:          8/8  F. Breathing:   Observed ---------------------------------------------------------------------- Biometry  BPD:      86.6  mm     G. Age:  35w 0d         28  %    CI:        69.96   %   70 - 86                                                          FL/HC:      18.2   %   20.1 - 22.1  HC:      330.3  mm     G. Age:  37w 4d         59  %    HC/AC:      1.12       0.93 - 1.11  AC:      295.3  mm     G. Age:  33w 4d          6  %    FL/BPD:     69.3   %   71 - 87  FL:         60  mm     G. Age:  31w 1d        < 3  %    FL/AC:      20.3   %   20 - 24  HUM:      52.5  mm     G. Age:  30w 4d        < 5  %  Est. FW:    2183  gm    4 lb 13 oz    < 10  % ---------------------------------------------------------------------- Gestational Age  LMP:           36w 0d       Date:   02/24/16  EDD:   11/30/16  U/S Today:     34w 2d                                        EDD:   12/12/16  Best:          36w 0d    Det. By:   LMP  (02/24/16)          EDD:   11/30/16 ---------------------------------------------------------------------- Anatomy  Cranium:               Appears  normal         Aortic Arch:            Previously seen  Cavum:                 Previously seen        Ductal Arch:            Previously seen  Ventricles:            Previously seen        Diaphragm:              Previously seen  Choroid Plexus:        Previously seen        Stomach:                Appears normal, left                                                                        sided  Cerebellum:            Previously seen        Abdomen:                Previously seen  Posterior Fossa:       Previously seen        Abdominal Wall:         Previously seen  Nuchal Fold:           Previously seen        Cord Vessels:           Previously seen  Face:                  Orbits and profile     Kidneys:                Appear normal                         previously seen  Lips:                  Previously seen        Bladder:                Appears normal  Thoracic:              Appears normal         Spine:                  Previously seen  Previously seen  Heart:                 Previously seen        Upper Extremities:      Previously seen  RVOT:                  Previously seen        Lower Extremities:      Previously seen  LVOT:                  Previously seen  Other:  Fetus appears to be a female. Heels previously visualized. Open right          hands previously visualized. Technically difficult due to maternal          habitus and fetal position. ---------------------------------------------------------------------- Doppler - Fetal Vessels  Umbilical Artery   S/D     %tile                                            ADFV    RDFV   3.9    > 97.5                                               No      No ---------------------------------------------------------------------- Cervix Uterus Adnexa  Cervix  Not visualized (advanced GA >29wks)  Uterus  No abnormality visualized.  Left Ovary  Not visualized.  Right Ovary  Not visualized.  Cul De Sac:   No free fluid seen.  Adnexa:       No  abnormality visualized. ---------------------------------------------------------------------- Impression  Indication: 36 yr old G2P0100 at [redacted]w[redacted]d with chronic  hypertension and morbid obesity for fetal growth and BPP.  Findings:  1. Single intrauterine pregnancy.  2. Estimated fetal weight is in the <10th%.  3. Fundal placenta without evidence of previa.  4. Normal amniotic fluid index.  5. The limited anatomy survey is normal.  6. Elevated systolic/diastolic ratio on umbilical artery Doppler  studies; there is persistent forward flow.  7. Normal biophysical profile of 8/8.  8. Fetus is in breech presentation. ---------------------------------------------------------------------- Recommendations  1. Fetal growth restriction:  - discussed concern for placental insufficiency given elevated  Doppler studies  - discussed association with increased risk of fetal demise  and need for preterm delivery  - measurements were difficult to obtain given maternal body  habitus and may not be fully representative of fetal weight;  however given entire clinical picture recommend manage  conservatively  - recommend fetal kick counts- discussed with patient  - recommend follow up in 2-3 days for BPP and repeat  Doppler studies  - if Doppler studies remain elevated recommend delivery by  37 weeks  2. Hypertension:  - previously counseled  - no medications  - recommend monitor for preeclampsia  - fetal surveillance as above  3. Previous 21 week PPROM:  - previously counseled  - was on makena  4. Advanced maternal age:  - low risk first trimester screen and MSAFP  Discussed recommendations with Dr. Nehemiah Settle ----------------------------------------------------------------------                Wende Mott, MD Electronically Signed Final Report   11/02/2016 05:04 pm ----------------------------------------------------------------------  Assessment and Plan: Patient Active Problem List   Diagnosis Date Noted  . IUGR  (intrauterine growth restriction) affecting care of mother, third trimester, other fetus 11/05/2016  . BMI 60.0-69.9, adult ([place]) 05/04/2016  . Obesity affecting pregnancy in first trimester 05/04/2016  . Chronic hypertension in obstetric context, third trimester 05/04/2016  . Supervision of high risk pregnancy in first trimester 05/04/2016  . History of preterm delivery 08/20/2015  . History of cervical cerclage 08/20/2015  . Health care maintenance 11/01/2014  . PCOS (polycystic ovarian syndrome) 02/08/2013   Admit to Antenatal Routine antenatal care GBS Neg Continuous fetal monitoring Repeat BPP in AM  Katherine Basset, DO OB Fellow Faculty Practice, [facility]

## 2016-11-05 NOTE — ED Notes (Signed)
Pt to room 169.  Report given to [place], Therapist, sports.

## 2016-11-05 NOTE — ED Notes (Signed)
Discussed housing situation, she is currently trying to pack her things at her current home, due to possible eviction.  Pt plans to move to new home 7/20, concerned about if she has to deliver today, that she will not have things in place for her move.  Her sister has offered her a place to stay in between. Offered to get social work involved for possible housing needs, pt declined at this time.  Desires to wait for her ultrasound today.

## 2016-11-06 ENCOUNTER — Inpatient Hospital Stay (HOSPITAL_COMMUNITY): Payer: Medicaid Other

## 2016-11-06 DIAGNOSIS — Z3689 Encounter for other specified antenatal screening: Secondary | ICD-10-CM

## 2016-11-06 LAB — RPR: RPR Ser Ql: NONREACTIVE

## 2016-11-06 MED ORDER — LACTATED RINGERS IV SOLN: INTRAVENOUS | Status: DC

## 2016-11-06 NOTE — Progress Notes (Signed)
Discharge information given and fetal kick counts reviewed with patient. Patient verbalizes understanding and follow up appointments. Return to clinic for decreased FM.

## 2016-11-06 NOTE — Discharge Summary (Signed)
Obstetric Discharge Summary Reason for Admission: Initially for IOL due to non-reassuring fetal testing with BPP 4/10 , but with reactive NST entire visit, patient admitted for prolonged fetal monitoring Prenatal Procedures: NST and BPP/ultrasound Intrapartum Procedures: N/A Postpartum Procedures: N/A Complications-Operative and Postpartum: N/A Hemoglobin  Date Value Ref Range Status  11/05/2016 13.0 12.0 - 15.0 g/dL Final  11/02/2016 12.5 11.1 - 15.9 g/dL Final   HCT  Date Value Ref Range Status  11/05/2016 38.3 36.0 - 46.0 % Final   Hematocrit  Date Value Ref Range Status  11/02/2016 35.7 34.0 - 46.6 % Final    Physical Exam:  Constitutional:  She appears well-developed and well-nourished, severely morbidly obese.  HEENT: Non-icteric; EOMI; Normocephalic.  Cardiovascular: Normal rate   Pulmonary/Chest: Effort normal and breath sounds normal.  Abdominal: Soft. Non-tender. Unable to palpate fundal height 2/2 body habitus. Neurological: She is alert and oriented to person, place, and time.   Skin: Skin is warm and dry.  Psychiatric: She has a normal mood and affect. Her behavior is normal. Judgment and thought content normal.  NST prior to discharge: I personally reviewed the patient's NST today, found to be REACTIVE. 125 bpm, mod var, +accels, no decels. CTX: None.    Discharge Diagnoses: Reassuring fetal testing  Discharge Information: Date: 11/06/2016 Activity: unrestricted Diet: routine Medications: None Condition: stable Instructions: refer to practice specific booklet Discharge to: home [place] for [place]. Go on 11/10/2016.   Specialty:  Obstetrics and Gynecology Why:  Follow up for routine testing and appointments Contact information: [place] [id number] [place], DO [place] Fellow 11/06/2016, 3:25 PM

## 2016-11-06 NOTE — Clinical SW OB High Risk (Signed)
Clinical Social Work Antenatal   Clinical Social Worker:  Diana Bo Emerie Vanderkolk, LCSW Date/Time:  11/06/2016, 11:36 AM Gestational Age on Admission:  36 y.o. Admitting Diagnosis:  BPP   Expected Delivery Date:  11/30/16  Family/Home Environment  Home Address:  [place] [id number]  Household Member/Support Name:  Diana Blair Relationship:  Sister  Other Support:  Two un-identified supportive family members    Psychosocial Data  Information Source:  Patient Interview Resources:  Housing/homeless shelter resources    Employment:  Currently Employed    Medicaid [facility]):  [place]:  N/A   Current Grade:  High school graduate   Homebound Arranged: No  Other Resources:  Medicaid, WIC  Cultural/Environment Issues Impacting Care:  Housing    Strengths/Weaknesses/Factors to Consider  Concerns Related to Hospitalization:  N/a.    Previous Pregnancies/Feelings Towards Pregnancy?  Concerns related to being/becoming a mother?: No previous pregnancy hx. MOB has good current feelings regarding pregnancy   Social Support (FOB? Who is/will be helping with baby/other kids?): Diana Blair (sister) is her family support. Both MOB's parents are deceased. FOB reportedly not involved.   Couples Relationship (describe): Not together.    Recent Stressful Life Events (life changes in past year?): Recent loss of Housing.    Prenatal Care/Education/Home Preparations: She is currently received PNC. MOB is working towards preparing the home for baby's arrival.    Domestic Violence (of any type):  No If Yes to Domestic Violence, Describe/Action Plan:  N/a.   Substance Use During Pregnancy: No (If Yes, Complete SBIRT)  Complete PHQ-9 (Depresssion Screening) on all Antenatal Patients PHQ-9 Score (If Score => 15 complete TREAT):  n/a   Follow-up Recommendations:  Department of [place], [place]     Patient Advised/Response:   Patient verbalized understanding and was receptive to follow-up recommendations from [place].    Other:   N/a   Clinical Assessment/Plan:   CSW met with MOB at bedside to complete assessment for consult regarding MOB reportedly having housing issues. Upon this writers arrival, MOB was asleep in bed; however, easily aroused. This Probation officer explained role and reasoning for visit. MOB was warm and welcoming. CSW assessed housing needs. MOB informed this Probation officer that her current lease was up on October 23 2016 and she was given until July 15th to be out of the apartment before an eviction notice was filed. MOB noted she has been working to secure housing through [employer]; however, has been on the wait list for some time and was informed that she wont be at the top of the waiting list until 3 months from now. MOB notes she is currently housing herself and two other elderly family members (identified); thus does not want to go to the shelter. This Probation officer provided MOB with a list of local shelters as a last resort to utilize but also encouraged her to seek out Room at the [employer]. MOB noted Room [facility] the [facility] informed her that they have nothing available until well after she delivers. MOB notes she has spoken with a friend who was a prior resident at "[facility] " who is attempting to get her approved for an apartment there by July 20th. MOB notes she is confident this will work; however, she is also aware that things fall though. This Probation officer inquired about other family/friends. MOB notes she has no friends and her family is far and few between since her parents death in 2022-08-26. She  does identify her older sister Diana Blair as a support; however, Diana Blair is unable to accommodate her at this time. MOB notes she does have somewhere to stay this evening since she does not have to be out of her apartment until tomorrow; however, but she plans to plead with her land lord to give her some extra  days since she lost time by being admitted to the hospital.   This Probation officer informed MOB that although she is opposed to the shelter, her options are few and far between being that she as two elderly house hold residents with her that she does not want to be separated from. MOB notes she understands but emphasizes there are certain things she cannot allow to happen and one of this being going to a shelter. This Probation officer explored alternative options with MOB to include if by tomorrow her land lord does not allow her to stay an extra few days, she will utilize renting a hotel room for $200/week which she has done in the past and can afford, although not wanting to put out that much money weekly. This Probation officer encouraged MOB to reach out to [facility] and express to them that she is pregnant and due within the next couple of weeks to see if she can get priority status. Additionally, this writer encouraged MOB to reach out to Room at the [place] as room availability changes daily. MOB was in agreement with this plan and noted she will follow-up with all this writer recommendations. At this time, no other needs have been addressed or requested. CSW is available should any additional needs arise.   Diana Blair, MSW, LCSW-A Clinical Social Worker   [facility]  Office: [phone]

## 2016-11-06 NOTE — Discharge Instructions (Signed)

## 2016-11-06 NOTE — Progress Notes (Signed)
Patient ID: Diana Blair, female   DOB: Aug 03, 1980, 36 y.o.   MRN: [id number] [place]) NOTE  JAHARI WIGINTON is a 36 y.o. G2P0100 with Estimated Date of Delivery: 11/30/16   By  early ultrasound [redacted]w[redacted]d  who is admitted for prolonged monitoring due to BPP 4/10.    Fetal presentation is cephalic. Length of Stay:  1  Days  Date of admission:11/05/2016  Subjective: Pt is sleeping soundly has reported excellent fetal movement all night Patient reports the fetal movement as active. Patient reports uterine contraction  activity as none. Patient reports  vaginal bleeding as none. Patient describes fluid per vagina as None.  Vitals:  Blood pressure (!) 102/43, pulse 81, temperature 98.8 F (37.1 C), temperature source Oral, resp. rate 18, height 5\' 3"  (1.6 m), weight (!) 357 lb (161.9 kg), last menstrual period 02/24/2016, SpO2 99 %. Vitals:   11/05/16 1939 11/05/16 2359 11/06/16 0000 11/06/16 0334  BP: (!) 142/68 (!) 119/59  (!) 102/43  Pulse: 92 81  81  Resp: 18 18  18   Temp: 98.2 F (36.8 C) 98.5 F (36.9 C)  98.8 F (37.1 C)  TempSrc: Oral Oral  Oral  SpO2: 99% 100% 100% 99%  Weight:      Height:       Physical Examination:  General appearance - alert, well appearing, and in no distress Abdomen - gravid Fundal Height:  Difficult to assess due to obesity Pelvic Exam:  deferred  Membranes:intact  Fetal Monitoring:  Baseline: 140 bpm, Variability: Good {> 6 bpm) and Accelerations: Reactive   No decels  Labs:  Results for orders placed or performed during the hospital encounter of 11/05/16 (from the past 24 hour(s))  CBC   Collection Time: 11/05/16  3:00 PM  Result Value Ref Range   WBC 6.2 4.0 - 10.5 K/uL   RBC 4.60 3.87 - 5.11 MIL/uL   Hemoglobin 13.0 12.0 - 15.0 g/dL   HCT 38.3 36.0 - 46.0 %   MCV 83.3 78.0 - 100.0 fL   MCH 28.3 26.0 - 34.0 pg   MCHC 33.9 30.0 - 36.0 g/dL   RDW 15.1 11.5 - 15.5 %   Platelets 193 150 - 400 K/uL  RPR   Collection Time: 11/05/16  3:00 PM  Result Value Ref Range   RPR Ser Ql Non Reactive Non Reactive  Type and screen [place]   Collection Time: 11/05/16  3:00 PM  Result Value Ref Range   ABO/RH(D) O POS    Antibody Screen NEG    Sample Expiration 11/08/2016   Creatinine, serum   Collection Time: 11/05/16  3:16 PM  Result Value Ref Range   Creatinine, Ser 0.51 0.44 - 1.00 mg/dL   GFR calc non Af Amer >60 >60 mL/min   GFR calc Af Amer >60 >60 mL/min    Imaging Studies:    BPP pending  Medications:  Scheduled . docusate sodium  100 mg Oral Daily  . enoxaparin (LOVENOX) injection  80 mg Subcutaneous Q24H  . prenatal multivitamin  1 tablet Oral Q1200   I have reviewed the patient's current medications.  ASSESSMENT: G2P0100 [redacted]w[redacted]d Estimated Date of Delivery: 11/30/16  FGR with non reassuring BPP yesterday but excellent fetal monitoring since Patient Active Problem List   Diagnosis Date Noted  . IUGR (intrauterine growth restriction) affecting care of mother, third trimester, other fetus 11/05/2016  . BMI 60.0-69.9, adult ([place]) 05/04/2016  . Obesity affecting pregnancy in first trimester 05/04/2016  .  Chronic hypertension in obstetric context, third trimester 05/04/2016  . Supervision of high risk pregnancy in first trimester 05/04/2016  . History of preterm delivery 08/20/2015  . History of cervical cerclage 08/20/2015  . Health care maintenance 11/01/2014  . PCOS (polycystic ovarian syndrome) 02/08/2013    PLAN: Repeat BPP today and if total 8/10 then can be discharged to home  EURE,LUTHER H 11/06/2016,7:41 AM

## 2016-11-08 ENCOUNTER — Other Ambulatory Visit (HOSPITAL_COMMUNITY): Payer: Self-pay

## 2016-11-08 ENCOUNTER — Telehealth: Payer: Self-pay | Admitting: General Practice

## 2016-11-08 NOTE — Telephone Encounter (Signed)
Called patient and informed her that upon review of her MAU admission I do not see anything stating she cannot work. Patient verbalized understanding and states that she is at risk of getting fired from her job due to missing those days last week with the admission. Patient states they gave her FMLA paperwork for [place] to complete. Patient states she needs to be out of work now due to the intensity of her job. Patient states she is concerned about the baby after everything that happened last weekend and her history. Patient states the baby is less than 5 lb right now and she has that history of losing the baby and her high blood pressure. Patient states if the most important thing is taking care of her baby right now that cannot include work. Patient went into extensive detail about her job and job requirements. Patient states she cannot abide by our pregnancy guidelines because of how demanding that is on her body. Patient feels this intense work isn't helpful for her or her baby right now and puts her at risk. Explained to patient that she needs to bring the paperwork by and sign a ROI as soon as possible so we can begin working on it. Explained to patient that we cannot take her out before delivery without a medical indication. Suggested to patient that she discuss this with her provider at her appointment on Wednesday. Patient went into extensive detail how she felt it wasn't safe for her baby at this point to still be working her type of job. Patient states she knows other people that have gone out early before their due date because of preterm labor problems. Patient states she doesn't know what she is going to do because this paperwork has to be submitted by Friday or she is going to be without a job. Patient states at her previous job she got fired when she lost her baby and her mother died and she had to be out longer than the 2 weeks we gave her. Patient states she became homeless after that and might as well  figure out what she is going to do now because if that paperwork isn't filled out by Friday she is going to lose her job. Apologized to patient and explained that it isn't my decision to take her out of work or not and that she has to be here for an appt and speak to a provider about her concerns. Discussed that based off what her BPP says on Wednesday too that may determine course of IOL as well. Suggested she drop her papers off before Wednesday if she can and sign a release so we can at least begin some of the work on them. Patient verbalized understanding & had no other questions at this time

## 2016-11-08 NOTE — Telephone Encounter (Signed)
-----   Message from Theresa Mulligan sent at 11/08/2016 11:26 AM EDT ----- Contact: [phone] Patient has questions regarding her visit in [place]. Needs to know if she can continue to work? Patient okayed to leave message on voicemail if she is unable to answer.  Please call patient.  Thanks!

## 2016-11-10 ENCOUNTER — Encounter (HOSPITAL_COMMUNITY): Payer: Self-pay | Admitting: *Deleted

## 2016-11-10 ENCOUNTER — Inpatient Hospital Stay (HOSPITAL_COMMUNITY)
Admission: AD | Admit: 2016-11-10 | Discharge: 2016-11-14 | DRG: 774 | Disposition: A | Discharge status: Home / Self Care | Payer: Medicaid Other | Source: Ambulatory Visit | Attending: Obstetrics & Gynecology | Admitting: Obstetrics & Gynecology

## 2016-11-10 ENCOUNTER — Ambulatory Visit (INDEPENDENT_AMBULATORY_CARE_PROVIDER_SITE_OTHER): Payer: Medicaid Other | Admitting: Certified Nurse Midwife

## 2016-11-10 ENCOUNTER — Encounter (HOSPITAL_COMMUNITY): Payer: Self-pay

## 2016-11-10 ENCOUNTER — Ambulatory Visit (HOSPITAL_COMMUNITY)
Admission: RE | Admit: 2016-11-10 | Discharge: 2016-11-10 | Disposition: A | Discharge status: Home / Self Care | Payer: Medicaid Other | Source: Ambulatory Visit | Attending: Obstetrics and Gynecology | Admitting: Obstetrics and Gynecology

## 2016-11-10 VITALS — BP 149/61 | HR 69 | Wt 361.3 lb

## 2016-11-10 DIAGNOSIS — O09523 Supervision of elderly multigravida, third trimester: Secondary | ICD-10-CM

## 2016-11-10 DIAGNOSIS — Z6841 Body Mass Index (BMI) 40.0 and over, adult: Secondary | ICD-10-CM

## 2016-11-10 DIAGNOSIS — O99214 Obesity complicating childbirth: Secondary | ICD-10-CM | POA: Diagnosis present

## 2016-11-10 DIAGNOSIS — O09293 Supervision of pregnancy with other poor reproductive or obstetric history, third trimester: Secondary | ICD-10-CM

## 2016-11-10 DIAGNOSIS — Z3A37 37 weeks gestation of pregnancy: Secondary | ICD-10-CM

## 2016-11-10 DIAGNOSIS — Z87891 Personal history of nicotine dependence: Secondary | ICD-10-CM | POA: Diagnosis not present

## 2016-11-10 DIAGNOSIS — O10913 Unspecified pre-existing hypertension complicating pregnancy, third trimester: Secondary | ICD-10-CM

## 2016-11-10 DIAGNOSIS — O09213 Supervision of pregnancy with history of pre-term labor, third trimester: Secondary | ICD-10-CM | POA: Insufficient documentation

## 2016-11-10 DIAGNOSIS — O10919 Unspecified pre-existing hypertension complicating pregnancy, unspecified trimester: Secondary | ICD-10-CM | POA: Diagnosis present

## 2016-11-10 DIAGNOSIS — O365930 Maternal care for other known or suspected poor fetal growth, third trimester, not applicable or unspecified: Secondary | ICD-10-CM | POA: Diagnosis present

## 2016-11-10 DIAGNOSIS — O10013 Pre-existing essential hypertension complicating pregnancy, third trimester: Secondary | ICD-10-CM

## 2016-11-10 DIAGNOSIS — O1002 Pre-existing essential hypertension complicating childbirth: Principal | ICD-10-CM | POA: Diagnosis present

## 2016-11-10 DIAGNOSIS — O0993 Supervision of high risk pregnancy, unspecified, third trimester: Secondary | ICD-10-CM

## 2016-11-10 DIAGNOSIS — O99213 Obesity complicating pregnancy, third trimester: Secondary | ICD-10-CM | POA: Insufficient documentation

## 2016-11-10 DIAGNOSIS — O0991 Supervision of high risk pregnancy, unspecified, first trimester: Secondary | ICD-10-CM

## 2016-11-10 DIAGNOSIS — O09529 Supervision of elderly multigravida, unspecified trimester: Secondary | ICD-10-CM | POA: Insufficient documentation

## 2016-11-10 LAB — COMPREHENSIVE METABOLIC PANEL
ALT: 31 U/L (ref 14–54)
ANION GAP: 6 (ref 5–15)
AST: 22 U/L (ref 15–41)
Albumin: 2.8 g/dL — ABNORMAL LOW (ref 3.5–5.0)
Alkaline Phosphatase: 90 U/L (ref 38–126)
BILIRUBIN TOTAL: 0.4 mg/dL (ref 0.3–1.2)
BUN: 7 mg/dL (ref 6–20)
CO2: 22 mmol/L (ref 22–32)
Calcium: 9 mg/dL (ref 8.9–10.3)
Chloride: 108 mmol/L (ref 101–111)
Creatinine, Ser: 0.54 mg/dL (ref 0.44–1.00)
Glucose, Bld: 119 mg/dL — ABNORMAL HIGH (ref 65–99)
POTASSIUM: 3.7 mmol/L (ref 3.5–5.1)
Sodium: 136 mmol/L (ref 135–145)
TOTAL PROTEIN: 6 g/dL — AB (ref 6.5–8.1)

## 2016-11-10 LAB — POCT URINALYSIS DIP (DEVICE)
Bilirubin Urine: NEGATIVE
Glucose, UA: NEGATIVE mg/dL
Hgb urine dipstick: NEGATIVE
KETONES UR: NEGATIVE mg/dL
LEUKOCYTES UA: NEGATIVE
Nitrite: NEGATIVE
Protein, ur: NEGATIVE mg/dL
Specific Gravity, Urine: 1.015 (ref 1.005–1.030)
Urobilinogen, UA: 0.2 mg/dL (ref 0.0–1.0)
pH: 6.5 (ref 5.0–8.0)

## 2016-11-10 LAB — TYPE AND SCREEN
ABO/RH(D): O POS
ANTIBODY SCREEN: NEGATIVE

## 2016-11-10 LAB — CBC
HCT: 37.7 % (ref 36.0–46.0)
Hemoglobin: 12.5 g/dL (ref 12.0–15.0)
MCH: 27.8 pg (ref 26.0–34.0)
MCHC: 33.2 g/dL (ref 30.0–36.0)
MCV: 84 fL (ref 78.0–100.0)
PLATELETS: 200 10*3/uL (ref 150–400)
RBC: 4.49 MIL/uL (ref 3.87–5.11)
RDW: 15 % (ref 11.5–15.5)
WBC: 7.6 10*3/uL (ref 4.0–10.5)

## 2016-11-10 LAB — PROTEIN / CREATININE RATIO, URINE
CREATININE, URINE: 150 mg/dL
PROTEIN CREATININE RATIO: 0.16 mg/mg{creat} — AB (ref 0.00–0.15)
TOTAL PROTEIN, URINE: 24 mg/dL

## 2016-11-10 MED ORDER — LACTATED RINGERS IV SOLN
INTRAVENOUS | Status: DC
  Administered 2016-11-10 – 2016-11-12 (×6): via INTRAVENOUS

## 2016-11-10 MED ORDER — SOD CITRATE-CITRIC ACID 500-334 MG/5ML PO SOLN
30.0000 mL | ORAL | Status: DC | PRN
Start: 2016-11-10 — End: 2016-11-12

## 2016-11-10 MED ORDER — OXYCODONE-ACETAMINOPHEN 5-325 MG PO TABS: 1.0000 | ORAL_TABLET | ORAL | Status: DC | PRN

## 2016-11-10 MED ORDER — ACETAMINOPHEN 325 MG PO TABS: 650.0000 mg | ORAL_TABLET | ORAL | Status: DC | PRN

## 2016-11-10 MED ORDER — ONDANSETRON HCL 4 MG/2ML IJ SOLN: 4.0000 mg | Freq: Four times a day (QID) | INTRAMUSCULAR | Status: DC | PRN

## 2016-11-10 MED ORDER — MISOPROSTOL 200 MCG PO TABS
50.0000 ug | ORAL_TABLET | ORAL | Status: DC | PRN
  Administered 2016-11-10 – 2016-11-11 (×4): 50 ug via ORAL
  Filled 2016-11-10 (×4): qty 1

## 2016-11-10 MED ORDER — OXYTOCIN BOLUS FROM INFUSION
500.0000 mL | Freq: Once | INTRAVENOUS | Status: AC
  Administered 2016-11-12: 500 mL via INTRAVENOUS

## 2016-11-10 MED ORDER — LACTATED RINGERS IV SOLN: 500.0000 mL | INTRAVENOUS | Status: DC | PRN

## 2016-11-10 MED ORDER — OXYCODONE-ACETAMINOPHEN 5-325 MG PO TABS: 2.0000 | ORAL_TABLET | ORAL | Status: DC | PRN

## 2016-11-10 MED ORDER — OXYTOCIN 40 UNITS IN LACTATED RINGERS INFUSION - SIMPLE MED
2.5000 [IU]/h | INTRAVENOUS | Status: DC
  Filled 2016-11-10: qty 1000

## 2016-11-10 MED ORDER — LIDOCAINE HCL (PF) 1 % IJ SOLN
30.0000 mL | INTRAMUSCULAR | Status: DC | PRN
  Filled 2016-11-10: qty 30

## 2016-11-10 MED ORDER — TERBUTALINE SULFATE 1 MG/ML IJ SOLN
0.2500 mg | Freq: Once | INTRAMUSCULAR | Status: DC | PRN
  Filled 2016-11-10: qty 1

## 2016-11-10 NOTE — Progress Notes (Signed)
Subjective:  Diana Blair is a 36 y.o. G2P0100 at [redacted]w[redacted]d being seen today for ongoing prenatal care.  She is currently monitored for the following issues for this high-risk pregnancy and has PCOS (polycystic ovarian syndrome); Health care maintenance; History of preterm delivery; History of cervical cerclage; BMI 60.0-69.9, adult ([place]); Obesity affecting pregnancy in first trimester; Chronic hypertension in obstetric context, third trimester; Supervision of high risk pregnancy in first trimester; IUGR (intrauterine growth restriction) affecting care of mother, third trimester, other fetus; NST (non-stress test) reactive on fetal surveillance; and Advanced maternal age in multigravida on her problem list.  Patient reports no complaints.  Contractions: Irregular. Vag. Bleeding: None.  Movement: Present. Denies leaking of fluid.   The following portions of the patient's history were reviewed and updated as appropriate: allergies, current medications, past family history, past medical history, past social history, past surgical history and problem list. Problem list updated.  Objective:   Vitals:   11/10/16 1522 11/10/16 1529  BP: (!) 153/111 (!) 149/61  Pulse: 99 69  Weight: (!) 361 lb 4.8 oz (163.9 kg)     Fetal Status: Fetal Heart Rate (bpm): 140   Movement: Present     General:  Alert, oriented and cooperative. Patient is in no acute distress.  Skin: Skin is warm and dry. No rash noted.   Cardiovascular: Normal heart rate noted  Respiratory: Normal respiratory effort, no problems with respiration noted  Abdomen: Soft, gravid, appropriate for gestational age. Pain/Pressure: Present     Pelvic: Vag. Bleeding: None     Cervical exam deferred        Extremities: Normal range of motion.  Edema: None  Mental Status: Normal mood and affect. Normal behavior. Normal judgment and thought content.   Urinalysis: Urine Protein: Negative Urine Glucose: Negative  Assessment and Plan:  Pregnancy:  G2P0100 at [redacted]w[redacted]d  1. Chronic hypertension in obstetric context, third trimester - Fetal nonstress test reactive - Severe range BP today, repeat better but still elevated, pt asymptomatic - Considering hx of previously low AFV, abnormal dopplers, and IUGR I recommend IOL today. Discussed with Dr. Ernestina Patches (house attending) and agrees with plan - Pt refusing admit for IOL at this time, she doesn't believe clinic BP monitoring is accurate and she prefers to complete the BPP previously scheduled in MFM today and decide based on these results - I discussed with her the potential risk of stroke, seizure, and poor neonatal outcome given the severe range BP and previous identified complications - Will have pt complete BPP now and discuss results and recommendations immediately after, Dr. Ernestina Patches updated and will follow patient  2. Supervision of high risk pregnancy in first trimester   3. Elderly multigravida in third trimester   Term labor symptoms and general obstetric precautions including but not limited to vaginal bleeding, contractions, leaking of fluid and fetal movement were reviewed in detail with the patient. Please refer to After Visit Summary for other counseling recommendations.  No Follow-up on file.   Julianne Handler, CNM

## 2016-11-10 NOTE — H&P (Signed)
OBSTETRIC ADMISSION HISTORY AND PHYSICAL  Diana Blair is a 36 y.o. female G42P0100 with IUP at [redacted]w[redacted]d by LMP c/w early [place] presenting for IOL due to cHTN with severe range BP. Also with prenatal history this pregnancy of previously low AFV, abnormal dopplers, and IUGR I. She reports +FMs, No LOF, no VB, no blurry vision, headaches or peripheral edema, and RUQ pain.  She plans on breast feeding. She request condoms for birth control. She received her prenatal care at [facility]   Dating: By LMP --->  Estimated Date of Delivery: 11/30/16  Sono:    @[redacted]w[redacted]d , CWD, normal anatomy, cephalic presentation, FGR, Normal amniotic fluid volume  BPP 6/8 (no breathing movement),  UA dopplers were elevated for this GA (> 97th %tile)  Prenatal History/Complications: High-risk pregnancy  PCOS (polycystic ovarian syndrome)  History of preterm delivery @21wks   History of cervical cerclage Obesity affecting pregnancy Chronic hypertension  IUGR Advanced maternal age in multigravida   Past Medical History: Past Medical History:  Diagnosis Date  . 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 Diverticulitis   . Heart palpitations   . Morbid obesity with BMI of 50.0-59.9, adult (HCC)    BMI 58.8  . PCOS (polycystic ovarian syndrome)   . Seizure [facility])    as a child    Past Surgical History: Past Surgical History:  Procedure Laterality Date  . CERVICAL CERCLAGE  03/13/2012   Procedure: CERCLAGE CERVICAL;  Surgeon: Jonnie Kind, MD;  Location: [place] [facility];  Service: Gynecology;  Laterality: N/A;  . CERVICAL CERCLAGE  03/17/2012   Procedure: CERCLAGE CERVICAL;  Surgeon: Osborne Oman, MD;  Location: [facility] [facility];  Service: Gynecology;  Laterality: N/A;  Removal    Obstetrical History: OB History    Gravida Para Term Preterm AB Living   2 1 0 1 0 0   SAB TAB Ectopic Multiple Live Births   0 0 0 0 1      Social History: Social History   Social History  . Marital status: Married     Spouse name: N/A  . Number of children: N/A  . Years of education: N/A   Social History Main Topics  . Smoking status: Former Smoker    Types: Cigarettes    Quit date: 02/08/2009  . Smokeless tobacco: Never Used  . Alcohol use No  . Drug use: No  . Sexual activity: Yes    Birth control/ protection: None   Other Topics Concern  . Not on file   Social History Narrative  . No narrative on file    Family History: Family History  Problem Relation Age of Onset  . Cancer Mother        lung  . Hypertension Mother   . Diabetes Father   . Heart failure Father   . Diabetes Paternal Grandmother   . Hypertension Sister   . Hypertension Brother   . Rashes / Skin problems Sister   . Other Neg Hx     Allergies: No Known Allergies  Prescriptions Prior to Admission  Medication Sig Dispense Refill Last Dose  . [DISCONTINUED] Prenatal Multivit-Min-Fe-FA (PRENATAL VITAMINS) 0.8 MG tablet Take 1 tablet by mouth daily. 30 tablet 12 Taking     Review of Systems   All systems reviewed and negative except as stated in HPI  Last menstrual period 02/24/2016. General appearance: alert, cooperative, no distress and morbidly obese Lungs: normal work of breathing Heart: regular rate  Abdomen: soft, protuberant, non-tender Pelvic:  adequate Extremities: Homans sign is negative, no sign of DVT Presentation: cephalic Fetal monitoring unknown Uterine activity None  SVE: closed/thick/high  Prenatal labs: ABO, Rh: --/--/O POS (07/13 1500) Antibody: NEG (07/13 1500) Rubella: 4.55 (01/09 1457) RPR: Non Reactive (07/13 1500)  HBsAg: NEGATIVE (01/09 1457)  HIV: NONREACTIVE (01/09 1457)  GBS: Negative (07/10 1501)  2 hr Glucola normal Genetic screening  negative Anatomy US normal  Prenatal Transfer Tool  Maternal Diabetes: No Genetic Screening: Normal Maternal Ultrasounds/Referrals: Abnormal:  Findings:   IUGR, Other:elevated dopplers Fetal Ultrasounds or other Referrals:   Referred to Materal Fetal Medicine  Maternal Substance Abuse:  No Significant Maternal Medications:  None Significant Maternal Lab Results: None  Results for orders placed or performed in visit on 11/10/16 (from the past 24 hour(s))  POCT urinalysis dip (device)   Collection Time: 11/10/16  3:26 PM  Result Value Ref Range   Glucose, UA NEGATIVE NEGATIVE mg/dL   Bilirubin Urine NEGATIVE NEGATIVE   Ketones, ur NEGATIVE NEGATIVE mg/dL   Specific Gravity, Urine 1.015 1.005 - 1.030   Hgb urine dipstick NEGATIVE NEGATIVE   pH 6.5 5.0 - 8.0   Protein, ur NEGATIVE NEGATIVE mg/dL   Urobilinogen, UA 0.2 0.0 - 1.0 mg/dL   Nitrite NEGATIVE NEGATIVE   Leukocytes, UA NEGATIVE NEGATIVE    Patient Active Problem List   Diagnosis Date Noted  . Advanced maternal age in multigravida 11/10/2016  . Chronic hypertension in pregnancy 11/10/2016  . NST (non-stress test) reactive on fetal surveillance 11/06/2016  . IUGR (intrauterine growth restriction) affecting care of mother, third trimester, other fetus 11/05/2016  . BMI 60.0-69.9, adult ([place]) 05/04/2016  . Obesity affecting pregnancy in first trimester 05/04/2016  . Chronic hypertension in obstetric context, third trimester 05/04/2016  . Supervision of high risk pregnancy in first trimester 05/04/2016  . History of preterm delivery 08/20/2015  . History of cervical cerclage 08/20/2015  . Health care maintenance 11/01/2014  . PCOS (polycystic ovarian syndrome) 02/08/2013    Assessment/Plan:  Diana Blair is a 36 y.o. G2P0100 at [redacted]w[redacted]d here for IOL due to cHTN and abnormal dopplers  #Labor: IOL with cytotec #Pain: Labor support, plan for epidural #FWB: Unable to get a good tracing, Cat 1 previously on MFM #ID: GBS negative #MOF: breast #MOC: condoms #Circ:  N/A, girl  Diana Blare, DO  11/10/2016, 7:09 PM

## 2016-11-10 NOTE — Anesthesia Pain Management Evaluation Note (Signed)
  CRNA Pain Management Visit Note  Patient: Diana Blair, 36 y.o., female  "Hello I am a member of the anesthesia team at [facility]. We have an anesthesia team available at all times to provide care throughout the hospital, including epidural management and anesthesia for C-section. I don't know your plan for the delivery whether it a natural birth, water birth, IV sedation, nitrous supplementation, doula or epidural, but we want to meet your pain goals."   1.Was your pain managed to your expectations on prior hospitalizations?   No prior hospitalizations  2.What is your expectation for pain management during this hospitalization?     Epidural and IV pain meds  3.How can we help you reach that goal?   Record the patient's initial score and the patient's pain goal.   Pain: 0  Pain Goal: 8 [facility] wants you to be able to say your pain was always managed very well.  [name] 11/10/2016

## 2016-11-11 LAB — RPR: RPR: NONREACTIVE

## 2016-11-11 MED ORDER — PHENYLEPHRINE 40 MCG/ML (10ML) SYRINGE FOR IV PUSH (FOR BLOOD PRESSURE SUPPORT)
80.0000 ug | PREFILLED_SYRINGE | INTRAVENOUS | Status: DC | PRN
  Filled 2016-11-11: qty 10
  Filled 2016-11-11: qty 5

## 2016-11-11 MED ORDER — FENTANYL 2.5 MCG/ML BUPIVACAINE 1/10 % EPIDURAL INFUSION (WH - ANES)
14.0000 mL/h | INTRAMUSCULAR | Status: DC | PRN
  Administered 2016-11-12 (×2): 14 mL/h via EPIDURAL
  Filled 2016-11-11 (×2): qty 100

## 2016-11-11 MED ORDER — PHENYLEPHRINE 40 MCG/ML (10ML) SYRINGE FOR IV PUSH (FOR BLOOD PRESSURE SUPPORT)
80.0000 ug | PREFILLED_SYRINGE | INTRAVENOUS | Status: DC | PRN
  Filled 2016-11-11: qty 5

## 2016-11-11 MED ORDER — EPHEDRINE 5 MG/ML INJ
10.0000 mg | INTRAVENOUS | Status: DC | PRN
  Filled 2016-11-11: qty 2

## 2016-11-11 MED ORDER — DIPHENHYDRAMINE HCL 50 MG/ML IJ SOLN: 12.5000 mg | INTRAMUSCULAR | Status: DC | PRN

## 2016-11-11 MED ORDER — OXYTOCIN 40 UNITS IN LACTATED RINGERS INFUSION - SIMPLE MED
1.0000 m[IU]/min | INTRAVENOUS | Status: DC
Start: 2016-11-11 — End: 2016-11-12
  Administered 2016-11-11: 2 m[IU]/min via INTRAVENOUS

## 2016-11-11 MED ORDER — LACTATED RINGERS IV SOLN: 500.0000 mL | Freq: Once | INTRAVENOUS | Status: DC

## 2016-11-11 NOTE — Progress Notes (Signed)
Patient ID: Diana Blair, female   DOB: [birth year], 36 y.o.   MRN: [id number] Labor Progress Note  S: Patient seen & examined for progress of labor. Patient comfortable in chair.   O: BP (!) 145/75   Pulse 84   Temp 98.1 F (36.7 C) (Oral)   Resp 17   Ht 5\' 4"  (1.626 m)   Wt (!) 163.9 kg (361 lb 4.8 oz)   LMP 02/24/2016 (Approximate)   SpO2 100%   BMI 62.02 kg/m   FHT: 145bpm, mod var, +accels, no decels TOCO: q2-26min, patient looks comfortable during contractions  CVE: Dilation: 3 Effacement (%): Thick Cervical Position: Posterior Station: -3 Presentation: Vertex Exam by:: Dr. Lambert Keto  A&P: 36 y.o. G2P0100 [redacted]w[redacted]d IOL for IUGR with cHTN  Currently on 16 milli-units of pitocin- will check her in an hour, but she may decide on pit break later- wants enough time for her family to get here. Continue current management Anticipate SVD  [place] Carrie Schoonmaker, DO [facility] Resident PGY-1 11/11/2016 9:08 PM

## 2016-11-11 NOTE — Progress Notes (Signed)
LABOR PROGRESS NOTE  Diana Blair is a 36 y.o. G2P0100 at [redacted]w[redacted]d  admitted for IOL.  Subjective: Patient doing well, without concerns  Objective: BP (!) 153/83 (BP Location: Left Arm)   Pulse 78   Temp 99.7 F (37.6 C) (Oral)   Resp 18   Ht 5\' 4"  (1.626 m)   Wt (!) 361 lb 4.8 oz (163.9 kg)   LMP 02/24/2016 (Approximate)   SpO2 100%   BMI 62.02 kg/m  or  Vitals:   11/11/16 0737 11/11/16 0845 11/11/16 1104 11/11/16 1425  BP: 118/65 138/74 116/73 (!) 153/83  Pulse: 84 70 (!) 102 78  Resp: 20  16 18   Temp: 98 F (36.7 C)  98 F (36.7 C) 99.7 F (37.6 C)  TempSrc: Oral  Oral Oral  SpO2:      Weight:      Height:        NAD Dilation: 3 Effacement (%): Thick Cervical Position: Posterior Station: -3 Presentation: Vertex Exam by:: Dr. Lambert Keto FHT: baseline rate 135, moderate varibility, +acel, no decel Toco: irritability  Labs: Lab Results  Component Value Date   WBC 7.6 11/10/2016   HGB 12.5 11/10/2016   HCT 37.7 11/10/2016   MCV 84.0 11/10/2016   PLT 200 11/10/2016    Patient Active Problem List   Diagnosis Date Noted  . Advanced maternal age in multigravida 11/10/2016  . Chronic hypertension in pregnancy 11/10/2016  . NST (non-stress test) reactive on fetal surveillance 11/06/2016  . IUGR (intrauterine growth restriction) affecting care of mother, third trimester, other fetus 11/05/2016  . BMI 60.0-69.9, adult ([place]) 05/04/2016  . Obesity affecting pregnancy in first trimester 05/04/2016  . Chronic hypertension in obstetric context, third trimester 05/04/2016  . Supervision of high risk pregnancy in first trimester 05/04/2016  . History of preterm delivery 08/20/2015  . History of cervical cerclage 08/20/2015  . Health care maintenance 11/01/2014  . PCOS (polycystic ovarian syndrome) 02/08/2013    Assessment / Plan: 35 y.o. G2P0100 at [redacted]w[redacted]d here for IOL for cHTN, abnormal dopplers.  Labor: Start Pitocin Fetal Wellbeing:  Cat I Pain Control:  May  have epidural when in labor per patient's request Anticipated MOD:  SVD  Gailen Shelter, MD 11/11/2016, 2:46 PM

## 2016-11-11 NOTE — Progress Notes (Addendum)
LABOR PROGRESS NOTE  Diana Blair is a 36 y.o. G2P0100 at [redacted]w[redacted]d  admitted for IOL for IUGR  Subjective: Patient is doing well. Denies pain, vaginal bleeding or rush of fluids.   Objective: BP 118/65 (BP Location: Left Arm)   Pulse 84   Temp 98 F (36.7 C) (Oral)   Resp 20   Ht 5\' 4"  (1.626 m)   Wt (!) 163.9 kg (361 lb 4.8 oz)   LMP 02/24/2016 (Approximate)   SpO2 100%   BMI 62.02 kg/m  or  Vitals:   11/10/16 1940 11/11/16 0040 11/11/16 0530 11/11/16 0737  BP: 140/71 (!) 146/81 132/62 118/65  Pulse: 91 72 63 84  Resp: 16 16 16 20   Temp: 98.2 F (36.8 C) 98.3 F (36.8 C) 98.1 F (36.7 C) 98 F (36.7 C)  TempSrc: Oral Oral Oral Oral  SpO2: 100%     Weight: (!) 163.9 kg (361 lb 4.8 oz)     Height: 5\' 4"  (1.626 m)       FHT: 140/mod/+ac/-dc Toco: Irregular, q6+ minutes  Patient not rechecked, will recheck after 10am, last check 6am Dilation: 2 Effacement (%): Thick Cervical Position: Posterior Station: Ballotable Presentation: Vertex Exam by:: Remus Blake RN  Labs: Lab Results  Component Value Date   WBC 7.6 11/10/2016   HGB 12.5 11/10/2016   HCT 37.7 11/10/2016   MCV 84.0 11/10/2016   PLT 200 11/10/2016    Patient Active Problem List   Diagnosis Date Noted  . Advanced maternal age in multigravida 11/10/2016  . Chronic hypertension in pregnancy 11/10/2016  . NST (non-stress test) reactive on fetal surveillance 11/06/2016  . IUGR (intrauterine growth restriction) affecting care of mother, third trimester, other fetus 11/05/2016  . BMI 60.0-69.9, adult ([place]) 05/04/2016  . Obesity affecting pregnancy in first trimester 05/04/2016  . Chronic hypertension in obstetric context, third trimester 05/04/2016  . Supervision of high risk pregnancy in first trimester 05/04/2016  . History of preterm delivery 08/20/2015  . History of cervical cerclage 08/20/2015  . Health care maintenance 11/01/2014  . PCOS (polycystic ovarian syndrome) 02/08/2013    Assessment  / Plan: 36 y.o. G2P0100 at [redacted]w[redacted]d here for IOL 2/2 IUGR  cHTN: Stable, no severe range Labor: Expectant management, FB unable to hold, cytotecx3 Fetal Wellbeing:  Cat 1 Pain Control:  Nonpharmacologic for now Anticipated MOD:  Vaginal delivery  Aneta Mins, MD 11/11/2016, 9:41 AM

## 2016-11-11 NOTE — Progress Notes (Signed)
Patient ID: Diana Blair, female   DOB: [birth year], 36 y.o.   MRN: [id number] Labor Progress Note  S: Patient seen & examined for progress of labor. Patient comfortable.   O: BP (!) 146/81 (BP Location: Left Arm)   Pulse 72   Temp 98.3 F (36.8 C) (Oral)   Resp 16   Ht 5\' 4"  (1.626 m)   Wt (!) 163.9 kg (361 lb 4.8 oz)   LMP 02/24/2016 (Approximate)   SpO2 100%   BMI 62.02 kg/m   FHT: 140bpm, mod var, +accels, one variable decel TOCO: irregular, patient not feeling contractions  CVE: Dilation: 2 Effacement (%): Thick Cervical Position: Posterior Station: Ballotable Presentation: Vertex Exam by:: Remus Blake RN  A&P: 36 y.o. G2P0100 [redacted]w[redacted]d IOL for cHTN  S/p cytotec po x2. Attempted Foley bulb placement but uterus is too soft and posterior. Continue current management Anticipate SVD  [place] Lagretta Loseke, DO [facility] Resident PGY-1 11/11/2016 2:00 AM

## 2016-11-11 NOTE — Progress Notes (Signed)
Patient ID: Diana Blair, female   DOB: 1980/09/01, 36 y.o.   MRN: [id number]  S: Patient seen & examined for progress of labor induction. Patient comfortable sleeping in chair, reclined.    O:  Vitals:   11/10/16 1940 11/11/16 0040 11/11/16 0530 11/11/16 0737  BP: 140/71 (!) 146/81 132/62 118/65  Pulse: 91 72 63 84  Resp: 16 16 16 20   Temp: 98.2 F (36.8 C) 98.3 F (36.8 C) 98.1 F (36.7 C) 98 F (36.7 C)  TempSrc: Oral Oral Oral Oral  SpO2: 100%     Weight: (!) 361 lb 4.8 oz (163.9 kg)     Height: 5\' 4"  (1.626 m)       Dilation: 3 Effacement (%): Thick Cervical Position: Posterior Station: Ballotable Presentation: Vertex Exam by:: Alexina Niccoli   FHT: 135 bpm, mod var, +accels, no decels TOCO: Rare   A/P: One more dose of cytotec, then switch to pitocin Continue expectant management Anticipate SVD

## 2016-11-11 NOTE — Anesthesia Pain Management Evaluation Note (Signed)
  CRNA Pain Management Visit Note  Patient: Diana Blair, 36 y.o., female  "Hello I am a member of the anesthesia team at [facility]. We have an anesthesia team available at all times to provide care throughout the hospital, including epidural management and anesthesia for C-section. I don't know your plan for the delivery whether it a natural birth, water birth, IV sedation, nitrous supplementation, doula or epidural, but we want to meet your pain goals."   1.Was your pain managed to your expectations on prior hospitalizations?   Yes   2.What is your expectation for pain management during this hospitalization?     Epidural  3.How can we help you reach that goal? unsure  Record the patient's initial score and the patient's pain goal.   Pain: 0  Pain Goal: 8 [facility] wants you to be able to say your pain was always managed very well.  Casimer Lanius 11/11/2016

## 2016-11-11 NOTE — Progress Notes (Signed)
Vitals:   11/11/16 2131 11/11/16 2201  BP: (!) 142/95 (!) 143/77  Pulse: 82 83  Resp: 18   Temp: 97.9 F (36.6 C)    Cx 4-5/50/-3.  FHR Cat 1.  Pitocin at 20 mu/min.  Ctx q 1.5-3 minutes.  PT will probably get epidrual soon so she can sleep.

## 2016-11-12 ENCOUNTER — Inpatient Hospital Stay (HOSPITAL_COMMUNITY): Payer: Medicaid Other | Admitting: Anesthesiology

## 2016-11-12 ENCOUNTER — Encounter (HOSPITAL_COMMUNITY): Payer: Self-pay | Admitting: *Deleted

## 2016-11-12 DIAGNOSIS — O1002 Pre-existing essential hypertension complicating childbirth: Secondary | ICD-10-CM

## 2016-11-12 DIAGNOSIS — Z3A37 37 weeks gestation of pregnancy: Secondary | ICD-10-CM

## 2016-11-12 LAB — CBC
HEMATOCRIT: 38.7 % (ref 36.0–46.0)
HEMOGLOBIN: 13.1 g/dL (ref 12.0–15.0)
MCH: 28.2 pg (ref 26.0–34.0)
MCHC: 33.9 g/dL (ref 30.0–36.0)
MCV: 83.4 fL (ref 78.0–100.0)
Platelets: 192 10*3/uL (ref 150–400)
RBC: 4.64 MIL/uL (ref 3.87–5.11)
RDW: 15.1 % (ref 11.5–15.5)
WBC: 9.4 10*3/uL (ref 4.0–10.5)

## 2016-11-12 MED ORDER — SIMETHICONE 80 MG PO CHEW: 80.0000 mg | CHEWABLE_TABLET | ORAL | Status: DC | PRN

## 2016-11-12 MED ORDER — ONDANSETRON HCL 4 MG PO TABS: 4.0000 mg | ORAL_TABLET | ORAL | Status: DC | PRN

## 2016-11-12 MED ORDER — COCONUT OIL OIL: 1.0000 "application " | TOPICAL_OIL | Status: DC | PRN

## 2016-11-12 MED ORDER — SENNOSIDES-DOCUSATE SODIUM 8.6-50 MG PO TABS
2.0000 | ORAL_TABLET | ORAL | Status: DC
  Filled 2016-11-12: qty 2

## 2016-11-12 MED ORDER — IBUPROFEN 600 MG PO TABS
600.0000 mg | ORAL_TABLET | Freq: Four times a day (QID) | ORAL | Status: DC
  Administered 2016-11-13 – 2016-11-14 (×4): 600 mg via ORAL
  Filled 2016-11-12 (×6): qty 1

## 2016-11-12 MED ORDER — OXYTOCIN 40 UNITS IN LACTATED RINGERS INFUSION - SIMPLE MED: 2.5000 [IU]/h | INTRAVENOUS | Status: DC | PRN

## 2016-11-12 MED ORDER — SODIUM CHLORIDE 0.9% FLUSH: 3.0000 mL | Freq: Two times a day (BID) | INTRAVENOUS | Status: DC

## 2016-11-12 MED ORDER — TETANUS-DIPHTH-ACELL PERTUSSIS 5-2.5-18.5 LF-MCG/0.5 IM SUSP: 0.5000 mL | Freq: Once | INTRAMUSCULAR | Status: DC

## 2016-11-12 MED ORDER — SODIUM CHLORIDE 0.9 % IV SOLN: 250.0000 mL | INTRAVENOUS | Status: DC | PRN

## 2016-11-12 MED ORDER — PRENATAL MULTIVITAMIN CH
1.0000 | ORAL_TABLET | Freq: Every day | ORAL | Status: DC
  Administered 2016-11-13 – 2016-11-14 (×2): 1 via ORAL
  Filled 2016-11-12 (×2): qty 1

## 2016-11-12 MED ORDER — BENZOCAINE-MENTHOL 20-0.5 % EX AERO
1.0000 "application " | INHALATION_SPRAY | CUTANEOUS | Status: DC | PRN
  Administered 2016-11-14: 1 via TOPICAL
  Filled 2016-11-12: qty 56

## 2016-11-12 MED ORDER — ONDANSETRON HCL 4 MG/2ML IJ SOLN: 4.0000 mg | INTRAMUSCULAR | Status: DC | PRN

## 2016-11-12 MED ORDER — WITCH HAZEL-GLYCERIN EX PADS: 1.0000 "application " | MEDICATED_PAD | CUTANEOUS | Status: DC | PRN

## 2016-11-12 MED ORDER — ZOLPIDEM TARTRATE 5 MG PO TABS: 5.0000 mg | ORAL_TABLET | Freq: Every evening | ORAL | Status: DC | PRN

## 2016-11-12 MED ORDER — DIBUCAINE 1 % RE OINT: 1.0000 "application " | TOPICAL_OINTMENT | RECTAL | Status: DC | PRN

## 2016-11-12 MED ORDER — LIDOCAINE HCL (PF) 1 % IJ SOLN
INTRAMUSCULAR | Status: DC | PRN
  Administered 2016-11-12: 13 mL via EPIDURAL

## 2016-11-12 MED ORDER — DIPHENHYDRAMINE HCL 25 MG PO CAPS: 25.0000 mg | ORAL_CAPSULE | Freq: Four times a day (QID) | ORAL | Status: DC | PRN

## 2016-11-12 MED ORDER — SODIUM CHLORIDE 0.9% FLUSH: 3.0000 mL | INTRAVENOUS | Status: DC | PRN

## 2016-11-12 MED ORDER — ACETAMINOPHEN 325 MG PO TABS: 650.0000 mg | ORAL_TABLET | ORAL | Status: DC | PRN

## 2016-11-12 NOTE — Anesthesia Procedure Notes (Signed)
Epidural Patient location during procedure: OB Start time: 11/12/2016 12:44 AM End time: 11/12/2016 12:59 AM  Staffing Anesthesiologist: Candida Peeling RAY Performed: anesthesiologist   Preanesthetic Checklist Completed: patient identified, site marked, surgical consent, pre-op evaluation, timeout performed, IV checked, risks and benefits discussed and monitors and equipment checked  Epidural Patient position: sitting Prep: DuraPrep Patient monitoring: heart rate, cardiac monitor, continuous pulse ox and blood pressure Approach: midline Location: L2-L3 Injection technique: LOR saline  Needle:  Needle type: Tuohy  Needle gauge: 17 G Needle length: 15 cm Needle insertion depth: 10 cm Catheter type: closed end flexible Catheter size: 20 Guage Catheter at skin depth: 15 cm Test dose: negative  Assessment Events: blood not aspirated, injection not painful, no injection resistance, negative IV test and no paresthesia  Additional Notes Reason for block:procedure for pain

## 2016-11-12 NOTE — Anesthesia Postprocedure Evaluation (Signed)
Anesthesia Post Note  Patient: Diana Blair  Procedure(s) Performed: * No procedures listed *     Patient location during evaluation: Mother Baby Anesthesia Type: Epidural Level of consciousness: awake Pain management: pain level controlled Vital Signs Assessment: post-procedure vital signs reviewed and stable Respiratory status: spontaneous breathing Cardiovascular status: stable Postop Assessment: no headache, no backache, epidural receding, patient able to bend at knees, no signs of nausea or vomiting and adequate PO intake Anesthetic complications: no    Last Vitals:  Vitals:   11/12/16 1300 11/12/16 1400  BP: (!) 125/53 105/65  Pulse: 68 86  Resp: 18 18  Temp: 37.4 C 37.5 C    Last Pain:  Vitals:   11/12/16 1400  TempSrc: Oral  PainSc: 0-No pain   Pain Goal:                 [name]

## 2016-11-12 NOTE — Lactation Note (Signed)
This note was copied from a baby's chart. Lactation Consultation Note  Patient Name: Girl Marialy Urbanczyk [id number] Date: 11/12/2016 Reason for consult: Initial assessment (continued)  Mom reports + breast changes w/pregnancy. Mom reports having leaked since about 6 months gestation. Mom is large-breasted w/larger diameter nipples. An attempt was made to get "[name]" to latch, but infant only "gummed" the nipple. Hand expression was taught to Mom & infant was spoon-fed EBM w/ease.   Mom has requested that infant be taken to the nursery b/c she and FOB are exhausted & they are concerned they will not hear her spit-up (per Caryl Pina, RN, infant has spat up x 1 [small amount]).  Brochure left at bedside, but did not review in light of Mom's fatigue.  Matthias Hughs [facility] 11/12/2016, 6:36 PM

## 2016-11-12 NOTE — Anesthesia Preprocedure Evaluation (Signed)
Anesthesia Evaluation  Patient identified by MRN, date of birth, ID band Patient awake    Reviewed: Allergy & Precautions, H&P , Patient's Chart, lab work & pertinent test results  Airway Mallampati: II  TM Distance: >3 FB Neck ROM: full    Dental no notable dental hx. (+) Teeth Intact   Pulmonary sleep apnea , former smoker,  Probable OSA   Pulmonary exam normal        Cardiovascular hypertension, Normal cardiovascular exam Rhythm:regular Rate:Normal  Elevated BP's   Neuro/Psych Seizures -,  negative psych ROS   GI/Hepatic negative GI ROS, Neg liver ROS,   Endo/Other  Morbid obesity  Renal/GU negative Renal ROS  negative genitourinary   Musculoskeletal   Abdominal Normal abdominal exam  (+)   Peds  Hematology negative hematology ROS (+)   Anesthesia Other Findings For cerclage removal.  Reproductive/Obstetrics (+) Pregnancy + or - 20 weeks Incompetent Cervix Chorioamnionitis                             Anesthesia Physical  Anesthesia Plan  ASA: III  Anesthesia Plan: Epidural   Post-op Pain Management:    Induction:   PONV Risk Score and Plan:   Airway Management Planned:   Additional Equipment:   Intra-op Plan:   Post-operative Plan:   Informed Consent: I have reviewed the patients History and Physical, chart, labs and discussed the procedure including the risks, benefits and alternatives for the proposed anesthesia with the patient or authorized representative who has indicated his/her understanding and acceptance.   Dental Advisory Given  Plan Discussed with: Anesthesiologist  Anesthesia Plan Comments:         Anesthesia Quick Evaluation

## 2016-11-12 NOTE — Progress Notes (Signed)
Comfortable with epidural Vitals:   11/12/16 0131 11/12/16 0201  BP: (!) 148/79 121/60  Pulse: 72 74  Resp: 16 16  Temp: 97.8 F (36.6 C)    Cx 6/100/-2 IUPC placed after AROM, clear fluid Pitocin at 22 mu/min FHR Cat 1 Anticipate SVD

## 2016-11-13 NOTE — Lactation Note (Signed)
This note was copied from a baby's chart. Lactation Consultation Note  Patient Name: Diana Blair [id number] Date: 11/13/2016 Reason for consult: Follow-up assessment;Difficult latch;Infant < 6lbs;Other (Comment) (Early Term infant)   Follow up with mom at request of nurse. Mom reports she does not feel BF is going well. She has visitors currently so was not able to assess breasts. Infant was asleep in crib and fed a few hours ago per mom. Mom reports she has been pumping every few hours and is not getting enough to feed infant so formula was started. Mom reports she has been told several different things and is concerned infant has not been getting enough. Reviewed with mom that infant with good output and weight loss wnl. Discussed that each day the feeding plan may need to be reviewed and changed to meet the needs of the baby and mom. Praised mom for all her hard work in providing milk for her infant.   Reviewed pumping, hand expression, supplement amounts, and normal NB feeding behaviors (including early term infants). Reviewed not to allow feedings to go longer than 30 minutes at a time. Mom asking about a pump for d/c. She is active with [facility]. Explained pump loaner program with mom for day of d/c with follow up with [facility] for pump. Platea referral faxed to [facility] since infant having difficulty latching to mom's large nipples and mom desires to exclusively BF infant when milk comes in.   Mom reports she feels better after meeting with [facility] and understands plan better. Reviewed plan for pumping and feeding and supplement amounts per day of age. Mom without further questions/concerns at this time.   Plan written on board in room:  Breast feed infant 8-12 x in 24 hours at first feeding cues with no longer than 3 hours between feeds Supplement infant with EBM first, followed by formula to meet supplementation guidelines Pump for 15 minutes with DEBP on initiate setting Hand  express post pumping Call for assistance as needed Rest until next feeding    Maternal Data Formula Feeding for Exclusion: No Has patient been taught Hand Expression?: Yes Does the patient have breastfeeding experience prior to this delivery?: No  Feeding Feeding Type: Bottle Fed - Formula Nipple Type: Slow - flow  LATCH Score/Interventions                      Lactation Tools Discussed/Used WIC Program: Yes Pump Review: Setup, frequency, and cleaning;Milk Storage Initiated by:: Reviewed and encouraged at least every 3 hours post BF Date initiated:: 11/12/16   Consult Status Consult Status: Follow-up Date: 11/14/16 Follow-up type: In-patient    Debby Freiberg Eleana Tocco 11/13/2016, 10:51 PM

## 2016-11-13 NOTE — Clinical Social Work Maternal (Signed)
CLINICAL SOCIAL WORK MATERNAL/CHILD NOTE  Patient Details  Name: Diana Blair MRN: [id number] Date of Birth: 11/01/1980  Date:  11/13/2016  Clinical Social Worker Initiating Note:  Roczen Waymire, MSW, LCSW-A   Date/ Time Initiated:  11/13/16/1413              Child's Name:  Unknown at this time.    Legal Guardian:  Mother   Need for Interpreter:  None   Date of Referral:  11/12/16     Reason for Referral:  Homelessness    Referral Source:  RN   Address:  [place] this time. MOB is currently staying with her sister temporaily and did not want to provide that address at this time.   Phone number:  [phone]   Household Members: Self, Siblings   Natural Supports (not living in the home): Extended Family, Friends   Professional Supports:None   Employment:Part-time   Type of Work: Unknown to this writer    Education:  High school graduate   Financial Resources:Medicaid   Other Resources: WIC, Food Stamps    Cultural/Religious Considerations Which May Impact Care: None reported.   Strengths: Ability to meet basic needs , Compliance with medical plan , Home prepared for child    Risk Factors/Current Problems: Other (Comment) (Homelessness)   Cognitive State: Alert , Able to Concentrate , Goal Oriented , Insightful    Mood/Affect: Calm , Interested , Comfortable , Happy    CSW Assessment:CSW acknowledges consult for MOB re: homeless. CSW met with MOB at bedside to complete assessment. CSW is familiar with MOB as this writer completed assessment with her when she was admitted to the OB HR unit. This writer explained reasoning for visit. MOB was warm and welcoming. CSW inquired about housing outcome from last meeting. MOB noted she is currently staying with her sister, Monica (who was present at bedside) until she identifies permanent housing for she and the baby. MOB notes she has a car seat and stroller for the baby as  well as a basinet. This writer inquired where MOB plans to move. MOB notes she will be moving to her own place in [place], [place]. This writer praised MOB for her ability to be resourceful and secure housing for she and baby. MOB was thankful noting she utilized resources last provided by this writer to assist her. MOB noted MOB's sister at bedside to be very helpful and supportive of MOB and baby. At this time, no other needs were addressed or requested. Case closed to this CSW.   CSW Plan/Description: Information/Referral to Community Resources , Patient/Family Education , No Further Intervention Required/No Barriers to Discharge   Yandell Mcjunkins, MSW, LCSW-A Clinical Social Worker  [facility]  Office: [phone]    CLINICAL SOCIAL WORK MATERNAL/CHILD NOTE  Patient Details  Name: Diana Blair MRN: [id number] Date of Birth: 04-10-1981  Date:  11/13/2016  Clinical Social Worker Initiating Note:  Ferdinand Lango Celesta Funderburk, MSW, LCSW-A  Date/ Time Initiated:  11/13/16/1413     Child's Name:  Unknown at this time.    Legal Guardian:  Mother   Need for Interpreter:  None   Date of Referral:  11/12/16     Reason for Referral:  Homelessness    Referral Source:  RN   Address:  [place] this time. MOB is currently staying with her sister temporaily and did not want to provide that address at this time.   Phone number:  [phone]   Household Members:  Self, Siblings   Natural Supports (not living in the home):  Extended Family, Friends   Professional Supports: None   Employment: Part-time   Type of Work: Unknown to this Chief Financial Officer Resources:  [employer]   Other Resources:  [employer], Food Stamps    Cultural/Religious Considerations Which May Impact Care:  None reported.   Strengths:  Ability to meet basic needs , Compliance with medical plan , Home prepared for child    Risk Factors/Current Problems:  Other (Comment) (Homelessness)   Cognitive State:  Alert , Able to Concentrate , Goal Oriented , Insightful    Mood/Affect:  Calm , Interested , Comfortable , Happy    CSW Assessment: CSW acknowledges consult for MOB re: homeless. CSW met with MOB at bedside to complete assessment. CSW is familiar with MOB as this Probation officer completed assessment with her when she was admitted to the OB HR unit. This Probation officer explained reasoning for visit. MOB was warm and welcoming. CSW inquired about housing outcome from last meeting. MOB noted she is currently staying with her sister, Brayton Layman (who was present at bedside) until she identifies permanent housing for she and the baby. MOB notes she has a car seat and stroller for the baby as well as a basinet. This Probation officer  inquired where MOB plans to move. MOB notes she will be moving to her own place in [place], [place]. This Probation officer praised MOB for her ability to be resourceful and secure housing for she and baby. MOB was thankful noting she utilized resources last provided by this Probation officer to assist her. MOB noted MOB's sister at bedside to be very helpful and supportive of MOB and baby. At this time, no other needs were addressed or requested. Case closed to this CSW.   CSW Plan/Description:  Information/Referral to [employer] , [employer] , No Further Intervention Required/No Barriers to Discharge   [employer], MSW, [facility]  Office: [phone]

## 2016-11-13 NOTE — Lactation Note (Signed)
This note was copied from a baby's chart. Lactation Consultation Note New mom has "V" breast w/LARGE everted nipple. LC hasn't seen baby latch. ? Nipple to large in baby's mouth for transfer. Encouraged to call [facility] for latch.  Mom obese. Has D;x of PCOS. Hand expression of easy flow of colostrum. Mom stated her colostrum has been leaking since 6th months of pregnancy. Elevated breast w/rolled cloth for support under breast.  Hand expressed 3 ml colostrum. Baby had BF before came in rm for consult for 10 min.  Discussed supplementing after BF. Mom stated she wants to give colostrum.  Discussed not feeding baby over 30 min. Includes BF and supplementing.  Mom had lots of questions as well as FOB.   Noted mom has lisp and occasionally spits thin spray of salvia when talks. W/gloved finger assessed suck. Baby doesn't extend tongue under finger. Taught suck training. RN had taught mom. Mom done teach back.  Mom eager to learn care for baby so when she goes home she will know what to do.   RN set up DEBP. Mom pre-pump to get colostrum flowing before BF. Discussed pumping for supplementing and d/t baby less than 6lbs.  Mom's plan: BF-or pump first for Flow (per mom to make it easier for baby). Then give any colostrum pumped then hand express to give colostrum. ' Asked mom to ask MD if wants baby to have formula for supplement if not enough for amount mom is suppose to get.  Patient Name: Diana Blair Date: 11/13/2016 Reason for consult: Initial assessment   Maternal Data    Feeding Feeding Type: Breast Milk Length of feed:  (sucks falls off baby is unable to maintain a latch)  LATCH Score/Interventions Latch: Repeated attempts needed to sustain latch, nipple held in mouth throughout feeding, stimulation needed to elicit sucking reflex. (attempting to get a deep latch)  Audible Swallowing: None  Type of Nipple: Everted at rest and after stimulation  Comfort (Breast/Nipple):  Soft / non-tender     Hold (Positioning): Full assist, staff holds infant at breast Intervention(s): Breastfeeding basics reviewed;Support Pillows;Position options;Skin to skin  LATCH Score: 5  Lactation Tools Discussed/Used Tools: Pump Breast pump type: Double-Electric Breast Pump WIC Program: Yes Pump Review: Setup, frequency, and cleaning;Milk Storage Initiated by:: Jenna Luo RN Date initiated:: 11/12/16   Consult Status Consult Status: Follow-up Date: 11/13/16 Follow-up type: In-patient    Theodoro Kalata 11/13/2016, 6:57 AM

## 2016-11-13 NOTE — Progress Notes (Signed)
Post Partum Day 1 Subjective: no complaints, up ad lib, voiding, tolerating PO and + flatus  Objective: Blood pressure 135/62, pulse 79, temperature 98.6 F (37 C), temperature source Oral, resp. rate 18, height 5\' 4"  (1.626 m), weight (!) 163.9 kg (361 lb 4.8 oz), last menstrual period 02/24/2016, SpO2 100 %, unknown if currently breastfeeding.  Physical Exam:  General: alert and cooperative Lochia: appropriate Uterine Fundus: firm Incision: n/a DVT Evaluation: No evidence of DVT seen on physical exam. Negative Homan's sign. No cords or calf tenderness.   Recent Labs  11/10/16 2026 11/12/16 0004  HGB 12.5 13.1  HCT 37.7 38.7    Assessment/Plan: Plan for discharge tomorrow and Breastfeeding   LOS: 3 days   Martinique Shirley 11/13/2016, 6:31 AM   OB FELLOW POSTPARTUM PROGRESS NOTE ATTESTATION  I have seen and examined this patient and agree with above documentation in the resident's note.   Gailen Shelter, MD OB Fellow 9:12 AM

## 2016-11-14 MED ORDER — AMLODIPINE BESYLATE 5 MG PO TABS: 5.0000 mg | ORAL_TABLET | Freq: Every day | ORAL | 1 refills | Status: DC

## 2016-11-14 MED ORDER — AMLODIPINE BESYLATE 5 MG PO TABS
5.0000 mg | ORAL_TABLET | Freq: Every day | ORAL | Status: DC
  Filled 2016-11-14: qty 1

## 2016-11-14 MED ORDER — IBUPROFEN 600 MG PO TABS
600.0000 mg | ORAL_TABLET | Freq: Four times a day (QID) | ORAL | 0 refills | Status: DC
Start: 2016-11-14 — End: 2017-02-08

## 2016-11-14 MED ORDER — DOCUSATE SODIUM 100 MG PO CAPS: 100.0000 mg | ORAL_CAPSULE | Freq: Two times a day (BID) | ORAL | 2 refills | Status: DC | PRN

## 2016-11-14 NOTE — Progress Notes (Signed)
Diana Blair notified of pt refusing B/P med

## 2016-11-14 NOTE — Discharge Summary (Signed)
Obstetric Discharge Summary Reason for Admission: induction of labor Prenatal Procedures: none Intrapartum Procedures: spontaneous vaginal delivery Postpartum Procedures: none Complications-Operative and Postpartum: first degree perineal laceration Hemoglobin  Date Value Ref Range Status  11/12/2016 13.1 12.0 - 15.0 g/dL Final  11/02/2016 12.5 11.1 - 15.9 g/dL Final   HCT  Date Value Ref Range Status  11/12/2016 38.7 36.0 - 46.0 % Final   Hematocrit  Date Value Ref Range Status  11/02/2016 35.7 34.0 - 46.6 % Final    Physical Exam:  General: alert, cooperative and no distress Lochia: appropriate Uterine Fundus: firm Incision: n/a DVT Evaluation: No evidence of DVT seen on physical exam. Negative Homan's sign. No cords or calf tenderness. No significant calf/ankle edema.  Discharge Diagnoses: Term Pregnancy-delivered  Discharge Information: Date: 11/14/2016 Activity: pelvic rest Diet: routine Medications: Ibuprofen, Colace and Norvasc 5 mg Condition: stable Instructions: refer to practice specific booklet Discharge to: home [place] for Valentine. Schedule an appointment as soon as possible for a visit in 5 week(s).   Specialty:  Obstetrics and Gynecology Contact information: [place] [phone]          Newborn Data: Live born female  Birth Weight: 5 lb 11.9 oz (2605 g) APGAR: 9, 9  Home with mother.  Lattie Haw Leftwich-Kirby 11/14/2016, 8:00 AM

## 2016-11-14 NOTE — Progress Notes (Signed)
Patient previously declined norvasc as ordered by the MD this morning. Previous RN documented that patient refused. Patient was discharged around 1200. Baby was made a patient. Patient states that MD spoke with her and told her that she had a couple of hours to decide if she wanted to take medication or not. Patient requested medication at 1600; however patient has already been removed from system as a patient. Patient states that she has no way to pick up prescription until tomorrow. Spoke with Seth Bake in pharmacy, who provided dose for today. Patient responsible to obtaining further doses. Maxwell Caul, Leretha Dykes [place]

## 2016-11-15 ENCOUNTER — Ambulatory Visit: Payer: Self-pay

## 2016-11-15 NOTE — Lactation Note (Signed)
This note was copied from a baby's chart. Lactation Consultation Note  Patient Name: Diana Blair [id number] Date: 11/15/2016 Reason for consult: Follow-up assessment;Infant < 6lbs   Follow up with mom of 49 hour old early term infant. Infant with 3 BF for 10 minutes, EBM x 2 for 5-15 ml, formula x 5 of 30-52 ml, 7 voids and 0 stools in last 24 hours. Last stool 7/21 @ 2215, 5 stools in life.   Mom reports she tries to put infant to breast with each BF. She is concerned infant cannot latch deeply to transfer milk. Mom awakened infant and did very well with awakening techniques and positioning infant at the breast. Infant took several attempts to latch to breast. When infant did latch she was noted to have swallows. Milk was dripping out of breast as mom trying to get infant to latch. Mom tried for about 5 minutes and then she stopped and offered infant a bottle of formula.   Mom has DEBP set up in room, she reports she is not pumping after every feeding. Reviewed supply and demand and importance of frequent emptying of the breast to maintain milk supply. Mom reports she wants to be able to wean infant off the formula. Enc mom to offer infant breast with each feeding and to pump post feeding at least 8 x a day. Mom reports she is feeling fuller, she does not have s/s engorgement.   Mom spoke with [facility] while I was in the room, [facility] is short on pumps. Mom is willing to do 10 day [facility] loaner. She is planning to leave and go to the ATM to get money for [place] today. WIC is trying to get mom and appt for tomorrow. Paperwork left at bedside with phone # on board for mom to call when ready for pump rental.   Dad asked several questions in regards to storage of formula and warming of formula, questions answered.   Offered mom OP appt and she agreed, OP appt made for Friday 7/27 @ 11:30 am, appointment reminder given. Mom without further questions/concerns at this time.   Plan discussed with  mom:  Continue to offer breast 8-12 x in 24 hours at first feeding cues Supplement with EBM/ Alimentum after BF Pump for 15-20 minutes with DEBP Hand express post pumping Call for assistance as needed. OP Lactation appt Friday 7/27 @ 11:30    Maternal Data Has patient been taught Hand Expression?: Yes Does the patient have breastfeeding experience prior to this delivery?: No  Feeding Feeding Type: Breast Fed Nipple Type: Slow - flow Length of feed: 5 min  LATCH Score/Interventions Latch: Repeated attempts needed to sustain latch, nipple held in mouth throughout feeding, stimulation needed to elicit sucking reflex. Intervention(s): Skin to skin;Teach feeding cues;Waking techniques Intervention(s): Adjust position;Assist with latch;Breast massage;Breast compression  Audible Swallowing: A few with stimulation Intervention(s): Skin to skin;Hand expression;Alternate breast massage  Type of Nipple: Everted at rest and after stimulation  Comfort (Breast/Nipple): Soft / non-tender     Hold (Positioning): No assistance needed to correctly position infant at breast. Intervention(s): Breastfeeding basics reviewed;Support Pillows;Position options;Skin to skin  LATCH Score: 8  Lactation Tools Discussed/Used WIC Program: Yes Pump Review: Setup, frequency, and cleaning;Milk Storage Initiated by:: Reviewed and encouraged 8-12 x a day   Consult Status Consult Status: Follow-up Date: 11/15/16 Follow-up type: In-patient    Debby Freiberg Eilyn Polack 11/15/2016, 11:02 AM

## 2016-11-15 NOTE — Lactation Note (Signed)
This note was copied from a baby's chart. Lactation Consultation Note  Patient Name: Girl Torunn Chancellor [id number] Date: 11/15/2016 Reason for consult: Follow-up assessment;Other (Comment);Infant < 6lbs (for rental pump, )  Baby is 67 hours old, and for D/C.  2nd [place] visit today  [place] went to complete [facility] DEBP loaner.  Sheldon instructed mom on the use of the DEBP loaner and informed mom the loaner is only  For maintence mode. Reviewed supply and demand and the importance of being consistent with her  Pumping at least 8 x's day 15-20 mins, both breast to establish and protect milk supply.  Reminded mom if when her milk comes in the #24 [place] is to snug to switch to the #27.  Mom aware she has 10 days on the [facility] loaner and to return .  Mom also aware she has an LC O/P appt. Friday. Was instructed earlier today.  Mother informed of post-discharge support and given phone number to the lactation department, including services for phone call assistance; out-patient appointments; and breastfeeding support group. List of other breastfeeding resources in the community given in the handout. Encouraged mother to call for problems or concerns related to breastfeeding.   Maternal Data    Feeding    LATCH Score/Interventions                Intervention(s): Breastfeeding basics reviewed (mom had many breast feeding questions / [place] answered )     Lactation Tools Discussed/Used Tools: Pump Breast pump type: Double-Electric Breast Pump WIC Program: Yes (per mom - WIC called mom / no pumps ) Pump Review: Setup, frequency, and cleaning;Milk Storage Initiated by:: reviewed - MAI  Date initiated:: 11/15/16   Consult Status Consult Status: Follow-up Date: 11/19/16 Follow-up type: Out-patient [facility] LC O/P appt. )    Myer Haff 11/15/2016, 3:51 PM

## 2016-11-19 ENCOUNTER — Ambulatory Visit: Payer: Self-pay

## 2016-11-19 NOTE — Lactation Note (Signed)
This note was copied from a baby's chart. Lactation Consult  Mother's reason for visit:  Assistance with latch Visit Type:  Feeding assessment Appointment Notes:  See Below Consult:  Initial Lactation Consultant:  Donn Pierini  ________________________________________________________________________ Diana Blair Name:  Diana Blair Date of Birth:  11/12/2015 Pediatrician:  Laurena Spies, [facility] Gender:  female Gestational Age: [redacted]w[redacted]d (At Birth) Birth Weight:  5 lb 11.9 oz (2605 g) Weight at Discharge:                          Date of Discharge:   There were no vitals filed for this visit. Last weight taken from location [facility] of [facility]:  5 lb 12 oz      Location: [facility] (without diaper) Weight today: 5 lb 13.2 oz (2642 grams) with clean diaper   ________________________________________________________________________  Mother's Name: Diana Blair Type of delivery:  Vaginal, Spontaneous Delivery Breastfeeding Experience:   Infant has not been latching. Infant has been receiving breast milk and Alimentum via bottle Maternal Medical Conditions:  Gestational diabetes mellitis and Pregnancy induced hypertension Maternal Medications:  PNV and BP meds (unable to tell name)  ________________________________________________________________________  Breastfeeding History (Post Discharge)  Frequency of breastfeeding:  4-5 x a day before supplementation Duration of feeding:  0-20 minutes  Supplementation  Formula:  Volume 30-60 ml Frequency:  4-5 x a day        Brand: Similac Alimentum  Breastmilk:  Volume 45-78ml Frequency:  4-5 x a day l  Method:  Bottle,   Pumping  Type of pump:  Symphony Frequency:  5 x a day Volume:  45-60 ml each pumping  Infant Intake and Output Assessment  Voids:  8 in 24 hrs.  Color:  Clear yellow Stools:  3 in 24 hrs.  Color:  Yellow pasty to loose per mom depending on amount of EBM vs  Formula  ________________________________________________________________________  Maternal Breast Assessment  Breast:  Soft Nipple:  Erect Pain level:  0 Pain interventions:  Bra  _______________________________________________________________________ Feeding Assessment/Evaluation  Initial feeding assessment:  Infant's oral assessment:  WNL  Positioning:  Football Right breast  LATCH documentation:  Latch:  0 = Too sleepy or reluctant, no latch achieved, no sucking elicited.  Audible swallowing:  0 = None  Type of nipple:  2 = Everted at rest and after stimulation  Comfort (Breast/Nipple):  2 = Soft / non-tender  Hold (Positioning):  2 = No assistance needed to correctly position infant at breast  LATCH score:  6  Attached assessment:  none    Tools:  Pump   Pre-feed weight:  2642 g  (5 lb. 13.2 oz.) Post-feed weight:  2642 g (5 lb. 13.2 oz.) Amount transferred:  0 ml Amount supplemented:  45 ml  No  Total amount transferred:  0 ml Total supplement given:  45 ml  Initial OP appt with mom of 7 day old early term infant. Infant has not latched well since discharge from hospital. She will occasionally latch but mom does not feel infant gets latched deeply. Discussed with parents that infant is exhibiting typical BF behavior to a 19 week infant and infant may not BF well until closer to her due date. Enc mom to keep trying as she would like to exclusively BF infant.   Mom is independent in latching infant. Infant had fed 2 hours before coming in for visit. Mom worked very  hard to awaken her to feed and each time she would get up against mom she would go back to sleep. She did not latch to the breast. Infant bottle fed 45 cc EBM very easily from the bottle that mom brought with her. Mom is using the hospital nipples to feed infant. Mom is pumping about 5 x a day and supplementing with Alimentum. Reviewed supply and demand and enc mom to pump at least 8 x a day. Mom reports  she is busy and has places to go and cannot always pump, enc her to do the best she can.   Enc mom to keep infant at the breast as much as possible. Discussed pushing her to feedings to 3 hours to see if more rest between feeds will assist her with feeding more volume but to feed Diana Blair sooner is she awakens and asks to be fed. Infant is at birthweight at 12 days old. Infant has not gained weight in the last 3 days and has been weighed by Ped, WIC, and [facility] nurse, therefore enc family to try to increase feeds to 48-65 ml/feeding at least 8 x /day. Parents voiced understanding. Parents are aware to decrease stimulation to infant between feedings and use awakening techniques as needed.   Mom is planning to return for follow up OP appt next Friday. Parents with lots of questions that were answered.   Written plan given to parents: Breast feed infant at the breast with each feeding as infant desires Supplement infant with EBM/ Formula with 48-65 ml at least every 3 hours.  Awaken infant as needed before and during feeding Pump with DEBP every 2-3 hours for 15-20 minutes Keep up the great work Follow up OP 11/26/16 @ 2 pm (appt reminder given) Call with any questions/concerns as needed

## 2016-11-22 ENCOUNTER — Telehealth: Payer: Self-pay | Admitting: *Deleted

## 2016-11-22 NOTE — Telephone Encounter (Signed)
Patient had svd 11/12/16. Called and left message stating she was told that her swelling should go down in 4-5 days. It has not gone down at all, she cannot wear regular shoes, only shower shoes. What can she do or what can be prescribed to help? Please return call.

## 2016-11-23 NOTE — Telephone Encounter (Signed)
Returned patient's call. She stated that the swelling in her legs is starting to improve, though it is still painful to walk at times. Also that one leg appears bigger than the other. Stated that at an appointment earlier this week she was told that since there was no pain in the back of the leg that she does not have a DVT. She has been up moving a lot and has not been resting well d/t multiple appointments and demands of breastfeeding. Advised that she should try to rest as much as possible, put feet up. She has no c/o dizziness, blurred vision, headache or abd pain and stated she is taking her norvasc daily. At this point the call was cut off. I tried returning her call but had to leave a message on her vm. Asked her to call back to the office.

## 2016-11-25 ENCOUNTER — Ambulatory Visit: Payer: Medicaid Other | Admitting: General Practice

## 2016-11-25 VITALS — BP 132/68 | HR 59 | Ht 63.0 in | Wt 349.0 lb

## 2016-11-25 DIAGNOSIS — Z0130 Encounter for examination of blood pressure without abnormal findings: Secondary | ICD-10-CM

## 2016-11-25 NOTE — Progress Notes (Signed)
Patient here for BP check. Patient denies headaches, dizziness, or blurry vision. Patient reports taking norvasc daily. Edema +2 in feet & legs. BP reviewed with Dr Nehemiah Settle, who is fine with BP until postpartum visit. Informed patient of postpartum visit. Patient verbalized understanding & had no questions

## 2016-11-25 NOTE — Telephone Encounter (Signed)
Spoke with patient again, she stated that her swelling is slowly getting better. However she had a question as to how long to take her BP med. She is concerned that her BP is too low, it is lower than her normal before the baby. The BPs that she has gotten when out are wnl. I advised patient to keep taking her medication until her postpartum visit. She said she was told on discharge from the hospital that she should come in to get her BP checked but she did not have an appointment for this. I told her that I would ask the front office to call her and schedule this. Understanding voiced.

## 2016-11-26 ENCOUNTER — Ambulatory Visit: Payer: Self-pay

## 2016-11-26 NOTE — Lactation Note (Signed)
This note was copied from a baby's chart. Lactation Consult  Mother's reason for visit:  Follow up feeding assessment Visit Type: mom showed up 1 hour late for appointment and infant had been fed at 2 pm and was asleep Appointment Notes:  See below Consult:  Follow-Up Lactation Consultant:  Diana Blair  ________________________________________________________________________    ________________________________________________________________________  Mother's Name: Diana Blair Type of delivery:  Vaginal, Spontaneous Delivery Breastfeeding Experience:  It is hard to keep up with infants needs with pumping    Mom showed up to OP appt one hour late. Told mom I was unable to see her since I have another scheduled appt behind her. Did speak with her briefly before she left. She reports " I know she would not schedule and appt at 2 pm as I do all my stuff later in the afternoon". She had infant at doctor earlier today at 2 pm as infant has had vomiting and diarrhea the last few days. MD is working with mom to try other formulas since infant has been spitting with the powdered formula. Mom is sure infant with diarrhea although she strictly fed BM yesterday and reports she does not believe it was just BM stools.   Mom is not able to pump regularly because she feels she cannot fully follow the feeding plan, enc mom to do the best she can with pumping and breast feeding. She is aware of supply and demand and we discussed it takes the body a few days to respond to increased feeding and pumping. Mom reports sometimes infant feeds well at times and not at others. She is supplementing with EBM and formula. She reports her pumping volume is about half of what it has been. She reports infant will eat about 1.5-2 oz/ feeding. Enc mom to increase infant feeding as she will tolerate, mom reports she is afraid if she gives more that infant will spit up. Mom reports infant has not gained weight between the last  2 visits with doctor.   Mom has a [employer] pump for pumping. Infant with follow up Ped appt Next Friday for weight check. Mom says she will call back to schedule an OP appt at another time.

## 2016-12-23 ENCOUNTER — Ambulatory Visit (INDEPENDENT_AMBULATORY_CARE_PROVIDER_SITE_OTHER): Payer: Medicaid Other | Admitting: Student

## 2016-12-23 DIAGNOSIS — O0991 Supervision of high risk pregnancy, unspecified, first trimester: Secondary | ICD-10-CM

## 2016-12-23 NOTE — Patient Instructions (Signed)

## 2016-12-23 NOTE — Progress Notes (Signed)
Subjective:     Diana Blair is a 36 y.o. female who presents for a postpartum visit. She is 6 weeks postpartum following a delivery 11/12/2016. I have fully reviewed the prenatal and intrapartum course. The delivery was at 90 gestational weeks. Outcome: vaginal. Anesthesia: epidual. Postpartum course has been uncomplicated. Baby's course has been uncomplicated. Baby is feeding by breast/bottle. Bleeding not at this time. Bowel function is normal. Bladder function is normal. Patient is not  sexually active. Contraception method is nothing at time. Postpartum depression screening: negative   Review of Systems Pertinent items are noted in HPI.   Objective:    BP (!) 137/57   General:  alert, cooperative and no distress   Breasts:  inspection negative, no nipple discharge or bleeding, no masses or nodularity palpable  Lungs: clear to auscultation bilaterally  Heart:  regular rate and rhythm, S1, S2 normal, no murmur, click, rub or gallop  Abdomen: soft, non-tender; bowel sounds normal; no masses,  no organomegaly   Vulva:  not evaluated  Vagina: not evaluated  Cervix:  not evaluated  Corpus: not examined  Adnexa:  not evaluated  Rectal Exam: Not performed.        Assessment:    Normal postpartum exam. Pap smear not done at today's visit.  Blood pressure normal today; has not taken norvasc in 5 days.  Plan:    1. Contraception: none; desires to have another baby in the next year.  2. Patient bp normotensive today; recommended stopping norvasc for now.  3. Follow up in: 1 year or as needed.    Maye Hides CNM

## 2016-12-28 ENCOUNTER — Telehealth: Payer: Self-pay | Admitting: General Practice

## 2016-12-28 NOTE — Telephone Encounter (Signed)
Patient called and left message stating she had a postpartum appt on Thursday 8/30. Patient states she had just stopped bleeding that day and was told it was likely her period going off. Patient states she has started to bleed again today. Called patient, no answer- phone just rang then disconnected.

## 2016-12-30 NOTE — Telephone Encounter (Signed)
Called patient stating I am returning her phone call. Patient states she called days ago. Told patient I did call her that day but the phone just rang several times then disconnected without a voicemail. Asked patient how I could assist her today. Patient states she has been bleeding continuously since July delivery. Patient states it slowed down to spotting then recently became real heavy for a week and when she came to her postpartum appt last week it slowed to spotting so she didn't say anything. Patient states a week later she is still spotting. Per Dr Elly Modena, may schedule patient ultrasound to ensure no retained products as cause of bleeding but is likely patient coming off her period and body adjusting back especially with breastfeeding. Scheduled ultrasound for 9/13 @ 3pm. Informed patient of appt as a precaution to make sure everything is okay but the continued bleeding is likely her body adjusting/a prolonged period. Patient verbalized understanding to all & had no questions

## 2017-01-06 ENCOUNTER — Ambulatory Visit (HOSPITAL_COMMUNITY)
Admission: RE | Admit: 2017-01-06 | Discharge: 2017-01-06 | Disposition: A | Discharge status: Home / Self Care | Payer: Medicaid Other | Source: Ambulatory Visit | Attending: Obstetrics and Gynecology | Admitting: Obstetrics and Gynecology

## 2017-01-06 DIAGNOSIS — Z3A00 Weeks of gestation of pregnancy not specified: Secondary | ICD-10-CM | POA: Diagnosis not present

## 2017-01-07 ENCOUNTER — Telehealth: Payer: Self-pay | Admitting: *Deleted

## 2017-01-07 NOTE — Telephone Encounter (Signed)
-----   Message from Mora Bellman, MD sent at 01/07/2017  8:23 AM EDT ----- Please inform patient of normal ultrasound without evidence of retained products of conception. Patient should keep a menstrual calendar and schedule an appointment if abnormal bleeding persists   Thanks  Peggy

## 2017-01-07 NOTE — Telephone Encounter (Signed)
Attempted to call pt. No answer and no voice mail picked up. Will try later.

## 2017-01-10 NOTE — Telephone Encounter (Signed)
Attempted to contact pt unable to LM due to phone rings with no VM.  Letter sent.

## 2017-01-25 ENCOUNTER — Encounter: Payer: Self-pay | Admitting: Family Medicine

## 2017-02-08 ENCOUNTER — Encounter: Payer: Self-pay | Admitting: Obstetrics & Gynecology

## 2017-02-08 ENCOUNTER — Ambulatory Visit (INDEPENDENT_AMBULATORY_CARE_PROVIDER_SITE_OTHER): Payer: Medicaid Other | Admitting: Obstetrics & Gynecology

## 2017-02-08 DIAGNOSIS — N920 Excessive and frequent menstruation with regular cycle: Secondary | ICD-10-CM

## 2017-02-08 NOTE — Progress Notes (Signed)
History:  36 y.o. G2P1101 here today for continued bleeding PP. Pt delivered 11/12/2016.  Pt reports spotting since surgery that increased in Aug 23rd for 7 days. Pt reports only spotting now. Almost daily but, she misses days and it is mostly brown and very dark.  Has had a little heavier bleeding that  she thinks is assoc with menses. Pt is nursesing.       The following portions of the patient's history were reviewed and updated as appropriate: allergies, current medications, past family history, past medical history, past social history, past surgical history and problem list.  Review of Systems:  Pertinent items are noted in HPI.   Objective:  Physical Exam Blood pressure (!) 125/56, pulse 76, height 5\' 3"  (1.6 m), weight (!) 347 lb (157.4 kg), last menstrual period 01/21/2017, currently breastfeeding.  CONSTITUTIONAL: Well-developed, well-nourished female in no acute distress.  HENT:  Normocephalic, atraumatic EYES: Conjunctivae and EOM are normal. No scleral icterus.  NECK: Normal range of motion SKIN: Skin is warm and dry. No rash noted. Not diaphoretic.No pallor. [place]: Alert and oriented to person, place, and time. Normal coordination.    Labs and Imaging CLINICAL DATA:  Status post vaginal delivery on 11/12/2016. Patient is still having vaginal bleeding. Evaluate for retained products of conception. Morbid obesity.  EXAM: TRANSABDOMINAL AND TRANSVAGINAL ULTRASOUND OF PELVIS  TECHNIQUE: Both transabdominal and transvaginal ultrasound examinations of the pelvis were performed. Transabdominal technique was performed for global imaging of the pelvis including uterus, ovaries, adnexal regions, and pelvic cul-de-sac. It was necessary to proceed with endovaginal exam following the transabdominal exam to visualize the endometrium and adnexal regions.  COMPARISON:  11/02/2016  FINDINGS: Uterus  Measurements: 8.0 x 4.1 x 7.6 cm. No fibroids or other  mass visualized.  Endometrium  Thickness: 4.2 mm. Trace amount of fluid identified within the endometrium. No focal mass or increased vascularity.  Right ovary  Measurements: 4.1 x 2.2 x 2.9 cm. Normal appearance/no adnexal mass.  Left ovary  Measurements: 3.4 x 2.2 x 2.0 cm. Normal appearance/no adnexal mass.  Other findings  No abnormal free fluid.  IMPRESSION: 1. Normal appearance of the uterus and endometrium. 2. Normal appearance of the endometrial stripe. If bleeding remains unresponsive to hormonal or medical therapy, sonohysterogram should be considered for focal lesion work-up. (Ref: Radiological Reasoning: Algorithmic Workup of Abnormal Vaginal Bleeding with Endovaginal Sonography and Sonohysterography. AJR 2008; 400:Q67-61) 3. No adnexal mass. CLINICAL DATA:  Status post vaginal delivery on 11/12/2016. Patient is still having vaginal bleeding. Evaluate for retained products of conception. Morbid obesity.  EXAM: TRANSABDOMINAL AND TRANSVAGINAL ULTRASOUND OF PELVIS  TECHNIQUE: Both transabdominal and transvaginal ultrasound examinations of the pelvis were performed. Transabdominal technique was performed for global imaging of the pelvis including uterus, ovaries, adnexal regions, and pelvic cul-de-sac. It was necessary to proceed with endovaginal exam following the transabdominal exam to visualize the endometrium and adnexal regions.  COMPARISON:  11/02/2016  FINDINGS: Uterus  Measurements: 8.0 x 4.1 x 7.6 cm. No fibroids or other mass visualized.  Endometrium  Thickness: 4.2 mm. Trace amount of fluid identified within the endometrium. No focal mass or increased vascularity.  Right ovary  Measurements: 4.1 x 2.2 x 2.9 cm. Normal appearance/no adnexal mass.  Left ovary  Measurements: 3.4 x 2.2 x 2.0 cm. Normal appearance/no adnexal mass.  Other findings  No abnormal free fluid.  IMPRESSION: 1. Normal appearance of  the uterus and endometrium. 2. Normal appearance of the endometrial stripe. If bleeding remains unresponsive to hormonal  or medical therapy, sonohysterogram should be considered for focal lesion work-up. (Ref: Radiological Reasoning: Algorithmic Workup of Abnormal Vaginal Bleeding with Endovaginal Sonography and Sonohysterography. AJR 2008; 169:C78-93) 3. No adnexal mass.   Assessment & Plan:  Abnormal uterine bleeding Postpartum   qHCG and CBC F/u in 3 months or sooner prn  Ria Redcay L. Harraway-Smith, M.D., Cherlynn June

## 2017-02-09 LAB — CBC
Hematocrit: 38 % (ref 34.0–46.6)
Hemoglobin: 12.9 g/dL (ref 11.1–15.9)
MCH: 27.7 pg (ref 26.6–33.0)
MCHC: 33.9 g/dL (ref 31.5–35.7)
MCV: 82 fL (ref 79–97)
PLATELETS: 253 10*3/uL (ref 150–379)
RBC: 4.66 x10E6/uL (ref 3.77–5.28)
RDW: 16.3 % — AB (ref 12.3–15.4)
WBC: 6 10*3/uL (ref 3.4–10.8)

## 2017-02-09 LAB — BETA HCG QUANT (REF LAB)

## 2017-07-31 ENCOUNTER — Emergency Department (HOSPITAL_COMMUNITY): Payer: Medicaid Other

## 2017-07-31 ENCOUNTER — Other Ambulatory Visit: Payer: Self-pay

## 2017-07-31 ENCOUNTER — Encounter (HOSPITAL_COMMUNITY): Payer: Self-pay

## 2017-07-31 ENCOUNTER — Emergency Department (HOSPITAL_COMMUNITY)
Admission: EM | Admit: 2017-07-31 | Discharge: 2017-07-31 | Disposition: A | Discharge status: Home / Self Care | Payer: Medicaid Other | Attending: Emergency Medicine | Admitting: Emergency Medicine

## 2017-07-31 DIAGNOSIS — R05 Cough: Secondary | ICD-10-CM | POA: Diagnosis present

## 2017-07-31 DIAGNOSIS — Z87891 Personal history of nicotine dependence: Secondary | ICD-10-CM | POA: Insufficient documentation

## 2017-07-31 DIAGNOSIS — J189 Pneumonia, unspecified organism: Secondary | ICD-10-CM | POA: Diagnosis not present

## 2017-07-31 DIAGNOSIS — Z79899 Other long term (current) drug therapy: Secondary | ICD-10-CM | POA: Diagnosis not present

## 2017-07-31 MED ORDER — AZITHROMYCIN 250 MG PO TABS: 250.0000 mg | ORAL_TABLET | Freq: Every day | ORAL | 0 refills | Status: AC

## 2017-07-31 MED ORDER — PROMETHAZINE-DM 6.25-15 MG/5ML PO SYRP: 5.0000 mL | ORAL_SOLUTION | Freq: Four times a day (QID) | ORAL | 0 refills | Status: DC | PRN

## 2017-07-31 MED ORDER — AZITHROMYCIN 250 MG PO TABS
500.0000 mg | ORAL_TABLET | Freq: Once | ORAL | Status: AC
  Administered 2017-07-31: 500 mg via ORAL
  Filled 2017-07-31: qty 2

## 2017-07-31 NOTE — ED Provider Notes (Signed)
[facility] EMERGENCY DEPARTMENT Provider Note   CSN: [id number] Arrival date & time: 07/31/17  1641     History   Chief Complaint Chief Complaint  Patient presents with  . Cough    HPI Diana Blair is a 37 y.o. female no known past medical history presenting with 2 weeks of progressively worsening cough.  Known ill contact with young daughter. She reports associated subjective fevers and chills as well as posttussive emesis.  She has tried Mucinex and DayQuil without relief. She is not up-to-date on immunization with flu.  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  No recent antibiotic use.    HPI  Past Medical History:  Diagnosis Date  . 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 Diverticulitis   . Heart palpitations   . Morbid obesity with BMI of 50.0-59.9, adult (HCC)    BMI 58.8  . PCOS (polycystic ovarian syndrome)   . Seizure [facility])    as a child    Patient Active Problem List   Diagnosis Date Noted  . Advanced maternal age in multigravida 11/10/2016  . Chronic hypertension in pregnancy 11/10/2016  . IUGR (intrauterine growth restriction) affecting care of mother, third trimester, other fetus 11/05/2016  . BMI 60.0-69.9, adult ([place]) 05/04/2016  . History of preterm delivery 08/20/2015  . History of cervical cerclage 08/20/2015  . PCOS (polycystic ovarian syndrome) 02/08/2013    Past Surgical History:  Procedure Laterality Date  . CERVICAL CERCLAGE  03/13/2012   Procedure: CERCLAGE CERVICAL;  Surgeon: Jonnie Kind, MD;  Location: [facility];  Service: Gynecology;  Laterality: N/A;  . CERVICAL CERCLAGE  03/17/2012   Procedure: CERCLAGE CERVICAL;  Surgeon: Osborne Oman, MD;  Location: [facility];  Service: Gynecology;  Laterality: N/A;  Removal     OB History    Gravida  2   Para  2   Term  1   Preterm  1   AB  0   Living  1     SAB  0   TAB  0   Ectopic  0   Multiple  0   Live Births  2            Home Medications    Prior to Admission medications   Medication Sig Start Date End Date Taking? Authorizing Provider  azithromycin (ZITHROMAX Z-PAK) 250 MG tablet Take 1 tablet (250 mg total) by mouth daily for 4 days. 07/31/17 08/04/17  Emeline General, PA-C  Prenatal Vit-Fe Fumarate-FA (PRENATAL MULTIVITAMIN) TABS tablet Take 1 tablet by mouth at bedtime.    [provider]  promethazine-dextromethorphan (PROMETHAZINE-DM) 6.25-15 MG/5ML syrup Take 5 mLs by mouth 4 (four) times daily as needed for cough. 07/31/17   Emeline General, PA-C    Family History Family History  Problem Relation Age of Onset  . Cancer Mother        lung  . Hypertension Mother   . Diabetes Father   . Heart failure Father   . Diabetes Paternal Grandmother   . Hypertension Sister   . Hypertension Brother   . Rashes / Skin problems Sister   . Other Neg Hx     Social History Social History   Tobacco Use  . Smoking status: Former Smoker    Types: Cigarettes    Last attempt to quit: 02/08/2009    Years since quitting: 8.4  . Smokeless tobacco: Never Used  Substance Use Topics  .  Alcohol use: No  . Drug use: No     Allergies   Patient has no known allergies.   Review of Systems Review of Systems  Constitutional: Positive for chills and fever.  HENT: Positive for congestion. Negative for sinus pressure, sinus pain, sore throat, trouble swallowing and voice change.   Respiratory: Positive for cough. Negative for choking, chest tightness, shortness of breath, wheezing and stridor.   Cardiovascular: Negative for chest pain and palpitations.  Gastrointestinal: Positive for vomiting. Negative for abdominal distention, anal bleeding, blood in stool, diarrhea and nausea.       Posttussive emesis  Musculoskeletal: Negative for myalgias, neck pain and neck stiffness.  Skin: Negative for color change, pallor and rash.  Neurological: Negative for  dizziness and headaches.     Physical Exam Updated Vital Signs BP 135/73 (BP Location: Right Wrist)   Pulse 71   Temp 98.1 F (36.7 C) (Oral)   Resp 18   SpO2 99%   Physical Exam  Constitutional: She appears well-developed and well-nourished. No distress.  Afebrile, well-appearing, sitting comfortably in chair in no acute distress.  HENT:  Head: Normocephalic and atraumatic.  Right Ear: External ear normal.  Left Ear: External ear normal.  Mouth/Throat: Oropharynx is clear and moist. No oropharyngeal exudate.  Eyes: Conjunctivae and EOM are normal. Right eye exhibits no discharge. Left eye exhibits no discharge.  Neck: Normal range of motion. Neck supple.  Cardiovascular: Normal rate, regular rhythm, normal heart sounds and intact distal pulses.  No murmur heard. Pulmonary/Chest: Effort normal and breath sounds normal. No stridor. No respiratory distress. She has no wheezes. She has no rales.  Musculoskeletal: Normal range of motion.  Lymphadenopathy:    She has no cervical adenopathy.  Neurological: She is alert.  Skin: Skin is warm and dry. No rash noted. She is not diaphoretic. No erythema. No pallor.  Psychiatric: She has a normal mood and affect.  Nursing note and vitals reviewed.    ED Treatments / Results  Labs (all labs ordered are listed, but only abnormal results are displayed) Labs Reviewed - No data to display  EKG None  Radiology Dg Chest 2 View  Result Date: 07/31/2017 CLINICAL DATA:  Per patient reports cough X 2 weeks, dry cough, wheezing, occasional chest pain and SOB. HX former smoker EXAM: CHEST - 2 VIEW COMPARISON:  11/08/2015 FINDINGS: Normal heart, mediastinum and hila. a subtle interstitial and hazy airspace opacity is noted in the right upper lobe and central right middle lobe, consistent with multifocal pneumonia. There may be an additional small focus in the left upper lobe. No evidence of pulmonary edema. No pleural effusion or pneumothorax.  Heart, mediastinum and hila are unremarkable. Skeletal structures are intact. IMPRESSION: Subtle areas of lung opacity on the right consistent with multifocal pneumonia. Possible additional small infiltrate in the left upper lobe. Electronically Signed   By: Lajean Manes M.D.   On: 07/31/2017 17:41    Procedures Procedures (including critical care time)  Medications Ordered in ED Medications  azithromycin (ZITHROMAX) tablet 500 mg (500 mg Oral Given 07/31/17 1859)     Initial Impression / Assessment and Plan / ED Course  I have reviewed the triage vital signs and the nursing notes.  Pertinent labs & imaging results that were available during my care of the patient were reviewed by me and considered in my medical decision making (see chart for details).    Patient presents with progressively worsening productive cough over the last 2 weeks with  posttussive emesis.   Right sided multifocal pneumonia on plain films.  On exam she is afebrile, nontoxic, lungs cta, o2 sats 100% on room air. No recent healthcare exposure or antibiotic use.  Discharge home with antibiotics, antitussive and close follow-up with PCP.  Discussed strict return precautions and advised to return to the emergency department if experiencing any new or worsening symptoms. Instructions were understood and patient agreed with discharge plan. Final Clinical Impressions(s) / ED Diagnoses   Final diagnoses:  Community acquired pneumonia of right lung, unspecified part of lung    ED Discharge Orders        Ordered    azithromycin (ZITHROMAX Z-PAK) 250 MG tablet  Daily     07/31/17 1828    promethazine-dextromethorphan (PROMETHAZINE-DM) 6.25-15 MG/5ML syrup  4 times daily PRN     07/31/17 1828       Emeline General, PA-C 07/31/17 1907    Fatima Blank, MD 08/01/17 1600

## 2017-07-31 NOTE — ED Notes (Signed)
Pt stable, ambulatory, and verbalizes understanding of d/c instructions.  

## 2017-07-31 NOTE — Discharge Instructions (Addendum)
As discussed, take your entire course of antibiotics even if you feel better.  Stay well-hydrated and get some rest.  Use the cough medication as needed and do not drive while taking that medication as it may make you drowsy.  Follow up with the wellness center for primary care.  Return if symptoms worsen or new concerning symptoms in the meantime.

## 2017-07-31 NOTE — ED Triage Notes (Signed)
Pt reports cough X2 weeks. She states she coughs persistently but it is non-productive. She reports taking dayquil and mucinex with some improvement. Resp e/u.

## 2017-08-01 ENCOUNTER — Encounter (INDEPENDENT_AMBULATORY_CARE_PROVIDER_SITE_OTHER): Payer: Self-pay

## 2017-08-01 MED FILL — PROMETHAZINE-DM SYRUP: 6.25-15 | 5 days supply | Qty: 118 | Fill #0

## 2017-08-01 MED FILL — AZITHROMYCIN 250 MG TABLET: 250 | 4 days supply | Qty: 4 | Fill #0

## 2017-08-10 ENCOUNTER — Emergency Department (HOSPITAL_COMMUNITY): Payer: Medicaid Other

## 2017-08-10 ENCOUNTER — Encounter (HOSPITAL_COMMUNITY): Payer: Self-pay | Admitting: *Deleted

## 2017-08-10 ENCOUNTER — Emergency Department (HOSPITAL_COMMUNITY)
Admission: EM | Admit: 2017-08-10 | Discharge: 2017-08-10 | Disposition: A | Discharge status: Home / Self Care | Payer: Medicaid Other | Attending: Emergency Medicine | Admitting: Emergency Medicine

## 2017-08-10 ENCOUNTER — Other Ambulatory Visit: Payer: Self-pay

## 2017-08-10 DIAGNOSIS — Z87891 Personal history of nicotine dependence: Secondary | ICD-10-CM | POA: Diagnosis not present

## 2017-08-10 DIAGNOSIS — R05 Cough: Secondary | ICD-10-CM | POA: Diagnosis present

## 2017-08-10 DIAGNOSIS — J189 Pneumonia, unspecified organism: Secondary | ICD-10-CM | POA: Diagnosis not present

## 2017-08-10 DIAGNOSIS — I10 Essential (primary) hypertension: Secondary | ICD-10-CM | POA: Diagnosis not present

## 2017-08-10 MED ORDER — LEVOFLOXACIN 750 MG PO TABS: 750.0000 mg | ORAL_TABLET | Freq: Every day | ORAL | 0 refills | Status: DC

## 2017-08-10 MED ORDER — ALBUTEROL SULFATE HFA 108 (90 BASE) MCG/ACT IN AERS: 2.0000 | INHALATION_SPRAY | RESPIRATORY_TRACT | 3 refills | Status: DC | PRN

## 2017-08-10 MED ORDER — LEVOFLOXACIN 750 MG PO TABS
750.0000 mg | ORAL_TABLET | Freq: Once | ORAL | Status: AC
  Administered 2017-08-10: 750 mg via ORAL
  Filled 2017-08-10: qty 1

## 2017-08-10 MED ORDER — BENZONATATE 100 MG PO CAPS: 100.0000 mg | ORAL_CAPSULE | Freq: Three times a day (TID) | ORAL | 0 refills | Status: DC

## 2017-08-10 NOTE — ED Triage Notes (Signed)
Pt was seen last Sunday at [place] and diagnosed with pneumonia; pt was given an antibiotic and pt states she still has a cough and is having back and chest pain

## 2017-08-10 NOTE — ED Provider Notes (Signed)
[facility] EMERGENCY DEPARTMENT Provider Note   CSN: [id number] Arrival date & time: 08/10/17  1807     History   Chief Complaint Chief Complaint  Patient presents with  . Cough    HPI TEKISHA DARCEY is a 37 y.o. female.  HPI  The patient is a 37 year old female, she is morbidly obese with a history of diverticulitis and polycystic ovary syndrome but denies any history of diabetes, hypertension or heart disease or asthma.  She reports that approximately 10 days ago she started having some coughing, she was seen initially on 7 April, 10 days ago and treated with Zithromax for a cough which at the time she was also having fevers, she states that she got better but is continued to have a cough and is now having some increasing pain on the left side including the left anterior chest and left posterior chest intermittently especially with taking a deep breath or coughing.  She denies any more fevers, symptoms have been persistent, not any improvement with Promethazine DM.  She denies any significant shortness of breath or any worsening swelling of her lower extremities which is chronic.  She states that she is coughing but nothing seems to come up, she has not had any recent travel.  Her child was recently diagnosed with a viral process and ultimately was diagnosed with otitis media on the same visit where she was diagnosed with pneumonia.  The child is doing much much better.  Past Medical History:  Diagnosis Date  . 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 Diverticulitis   . Heart palpitations   . Morbid obesity with BMI of 50.0-59.9, adult (HCC)    BMI 58.8  . PCOS (polycystic ovarian syndrome)   . Seizure [facility])    as a child    Patient Active Problem List   Diagnosis Date Noted  . Advanced maternal age in multigravida 11/10/2016  . Chronic hypertension in pregnancy 11/10/2016  . IUGR (intrauterine growth restriction) affecting care of mother,  third trimester, other fetus 11/05/2016  . BMI 60.0-69.9, adult ([place]) 05/04/2016  . History of preterm delivery 08/20/2015  . History of cervical cerclage 08/20/2015  . PCOS (polycystic ovarian syndrome) 02/08/2013    Past Surgical History:  Procedure Laterality Date  . CERVICAL CERCLAGE  03/13/2012   Procedure: CERCLAGE CERVICAL;  Surgeon: Jonnie Kind, MD;  Location: [facility];  Service: Gynecology;  Laterality: N/A;  . CERVICAL CERCLAGE  03/17/2012   Procedure: CERCLAGE CERVICAL;  Surgeon: Osborne Oman, MD;  Location: [facility];  Service: Gynecology;  Laterality: N/A;  Removal     OB History    Gravida  2   Para  2   Term  1   Preterm  1   AB  0   Living  1     SAB  0   TAB  0   Ectopic  0   Multiple  0   Live Births  2            Home Medications    Prior to Admission medications   Medication Sig Start Date End Date Taking? Authorizing Provider  albuterol (PROVENTIL HFA;VENTOLIN HFA) 108 (90 Base) MCG/ACT inhaler Inhale 2 puffs into the lungs every 4 (four) hours as needed for wheezing or shortness of breath. 08/10/17   Noemi Chapel, MD  benzonatate (TESSALON) 100 MG capsule Take 1 capsule (100 mg total) by mouth every 8 (eight) hours. 08/10/17   Sabra Heck,  Aaron Edelman, MD  levofloxacin (LEVAQUIN) 750 MG tablet Take 1 tablet (750 mg total) by mouth daily. 08/10/17   Noemi Chapel, MD  Prenatal Vit-Fe Fumarate-FA (PRENATAL MULTIVITAMIN) TABS tablet Take 1 tablet by mouth at bedtime.    [provider]  promethazine-dextromethorphan (PROMETHAZINE-DM) 6.25-15 MG/5ML syrup Take 5 mLs by mouth 4 (four) times daily as needed for cough. 07/31/17   Emeline General, PA-C    Family History Family History  Problem Relation Age of Onset  . Cancer Mother        lung  . Hypertension Mother   . Diabetes Father   . Heart failure Father   . Diabetes Paternal Grandmother   . Hypertension Sister   . Hypertension Brother   . Rashes / Skin problems Sister   . Other  Neg Hx     Social History Social History   Tobacco Use  . Smoking status: Former Smoker    Types: Cigarettes    Last attempt to quit: 02/08/2009    Years since quitting: 8.5  . Smokeless tobacco: Never Used  Substance Use Topics  . Alcohol use: No  . Drug use: No     Allergies   Patient has no known allergies.   Review of Systems Review of Systems  All other systems reviewed and are negative.    Physical Exam Updated Vital Signs BP (!) 153/84 (BP Location: Right Arm)   Pulse 91   Temp 98.7 F (37.1 C) (Oral)   Resp 20   Ht 5\' 3"  (1.6 m)   Wt (!) 154.2 kg (340 lb)   LMP 07/03/2017   SpO2 100%   BMI 60.23 kg/m   Physical Exam  Constitutional: She appears well-developed and well-nourished. No distress.  HENT:  Head: Normocephalic and atraumatic.  Mouth/Throat: Oropharynx is clear and moist. No oropharyngeal exudate.  Eyes: Pupils are equal, round, and reactive to light. Conjunctivae and EOM are normal. Right eye exhibits no discharge. Left eye exhibits no discharge. No scleral icterus.  Neck: Normal range of motion. Neck supple. No JVD present. No thyromegaly present.  Cardiovascular: Normal rate, regular rhythm, normal heart sounds and intact distal pulses. Exam reveals no gallop and no friction rub.  No murmur heard. Pulmonary/Chest: Effort normal and breath sounds normal. No respiratory distress. She has no wheezes. She has no rales.  The patient is speaking in full sentences, there is no increased work of breathing, she appears very comfortable.  There is no abnormal lung sounds  Abdominal: Soft. Bowel sounds are normal. She exhibits no distension and no mass. There is no tenderness.  Musculoskeletal: Normal range of motion. She exhibits no edema or tenderness.  Lymphadenopathy:    She has no cervical adenopathy.  Neurological: She is alert. Coordination normal.  Skin: Skin is warm and dry. No rash noted. No erythema.  Psychiatric: She has a normal mood and  affect. Her behavior is normal.  Nursing note and vitals reviewed.    ED Treatments / Results  Labs (all labs ordered are listed, but only abnormal results are displayed) Labs Reviewed - No data to display  EKG None  Radiology Dg Chest 2 View  Result Date: 08/10/2017 CLINICAL DATA:  Cough and shortness of breath, diagnosed with pneumonia April 7th. EXAM: CHEST - 2 VIEW COMPARISON:  Chest radiograph July 31, 2017 FINDINGS: Multifocal consolidation lungs, increased from prior examination with large consolidation LEFT mid lung zone. No pleural effusion. Cardiomediastinal silhouette is normal. No pneumothorax. Large body habitus. Osseous structures  are nonsuspicious. IMPRESSION: Worsening multifocal pneumonia. Electronically Signed   By: Elon Alas M.D.   On: 08/10/2017 20:22    Procedures Procedures (including critical care time)  Medications Ordered in ED Medications - No data to display   Initial Impression / Assessment and Plan / ED Course  I have reviewed the triage vital signs and the nursing notes.  Pertinent labs & imaging results that were available during my care of the patient were reviewed by me and considered in my medical decision making (see chart for details).    I have personally viewed the patient's chest x-ray and I agree with the radiologist that there is what appears to be some worsening pneumonia.  That being said the patient appears extremely stable and has vital signs showing no tachycardia, no hypoxia, no hypotension, no fever.  She is not tachypneic, she has normal lung sounds and appears comfortable.  At this time I think it is better to broaden her antibiotic coverage and based on current recommendations she will need Levaquin 750 daily for 7 days  Tessalon Albuterol  The patient is in agreement and will have close follow-up.  Final Clinical Impressions(s) / ED Diagnoses   Final diagnoses:  Community acquired pneumonia, unspecified laterality      ED Discharge Orders        Ordered    levofloxacin (LEVAQUIN) 750 MG tablet  Daily     08/10/17 2111    benzonatate (TESSALON) 100 MG capsule  Every 8 hours     08/10/17 2111    albuterol (PROVENTIL HFA;VENTOLIN HFA) 108 (90 Base) MCG/ACT inhaler  Every 4 hours PRN     08/10/17 2111       Noemi Chapel, MD 08/10/17 2113

## 2017-08-10 NOTE — Discharge Instructions (Signed)
Your chest x-ray here shows that the pneumonia has gotten slightly worse.  You will need to have the medications listed below and you must follow-up within 72 hours or 3 days at your doctor's office.  If you do not have a doctor to see please see the list below or come back to the emergency department  #1 Tessalon 100 mg by mouth every 8 hours as needed  #2 albuterol, 2 puffs every 4 hours as needed to help with shortness of breath  #3 -Levofloxacin, 750 mg by mouth daily for 7 days  You may also take Mucinex to help loosen up the phlegm to get it out  If you have increasing pain shortness of breath fevers or coughing please come back to the emergency department immediately.

## 2017-08-11 MED FILL — levoFLOXacin 750 MG TABS: 750 | 7 days supply | Qty: 7 | Fill #0

## 2017-08-11 MED FILL — ALBUTEROL SULFATE HFA 108 (: 108 (90 BAS | 16 days supply | Qty: 18 | Fill #0

## 2017-08-11 MED FILL — BENZONATATE 100 MG CAP: 100 | 7 days supply | Qty: 21 | Fill #0

## 2017-08-17 ENCOUNTER — Inpatient Hospital Stay: Payer: Self-pay | Admitting: Critical Care Medicine

## 2017-08-30 ENCOUNTER — Encounter: Payer: Self-pay | Admitting: *Deleted

## 2017-12-20 ENCOUNTER — Ambulatory Visit (INDEPENDENT_AMBULATORY_CARE_PROVIDER_SITE_OTHER): Payer: Medicaid Other | Admitting: Obstetrics & Gynecology

## 2017-12-20 ENCOUNTER — Encounter: Payer: Self-pay | Admitting: Obstetrics & Gynecology

## 2017-12-20 ENCOUNTER — Other Ambulatory Visit (HOSPITAL_COMMUNITY)
Admission: RE | Admit: 2017-12-20 | Discharge: 2017-12-20 | Disposition: A | Discharge status: Home / Self Care | Payer: Medicaid Other | Source: Ambulatory Visit | Attending: Obstetrics & Gynecology | Admitting: Obstetrics & Gynecology

## 2017-12-20 VITALS — BP 127/73 | HR 73 | Wt 386.6 lb

## 2017-12-20 DIAGNOSIS — N898 Other specified noninflammatory disorders of vagina: Secondary | ICD-10-CM | POA: Insufficient documentation

## 2017-12-20 NOTE — Patient Instructions (Addendum)
Vaginitis Vaginitis is a condition in which the vaginal tissue swells and becomes red (inflamed). This condition is most often caused by a change in the normal balance of bacteria and yeast that live in the vagina. This change causes an overgrowth of certain bacteria or yeast, which causes the inflammation. There are different types of vaginitis, but the most common types are:  Bacterial vaginosis.  Yeast infection (candidiasis).  Trichomoniasis vaginitis. This is a sexually transmitted disease (STD).  Viral vaginitis.  Atrophic vaginitis.  Allergic vaginitis.  What are the causes? The cause of this condition depends on the type of vaginitis. It can be caused by:  Bacteria (bacterial vaginosis).  Yeast, which is a fungus (yeast infection).  A parasite (trichomoniasis vaginitis).  A virus (viral vaginitis).  Low hormone levels (atrophic vaginitis). Low hormone levels can occur during pregnancy, breastfeeding, or after menopause.  Irritants, such as bubble baths, scented tampons, and feminine sprays (allergic vaginitis).  Other factors can change the normal balance of the yeast and bacteria that live in the vagina. These include:  Antibiotic medicines.  Poor hygiene.  Diaphragms, vaginal sponges, spermicides, birth control pills, and intrauterine devices (IUD).  Sex.  Infection.  Uncontrolled diabetes.  A weakened defense (immune) system.  What increases the risk? This condition is more likely to develop in women who:  Smoke.  Use vaginal douches, scented tampons, or scented sanitary pads.  Wear tight-fitting pants.  Wear thong underwear.  Use oral birth control pills or an IUD.  Have sex without a condom.  Have multiple sex partners.  Have an STD.  Frequently use the spermicide nonoxynol-9.  Eat lots of foods high in sugar.  Have uncontrolled diabetes.  Have low estrogen levels.  Have a weakened immune system from an immune disorder or medical  treatment.  Are pregnant or breastfeeding.  What are the signs or symptoms? Symptoms vary depending on the cause of the vaginitis. Common symptoms include:  Abnormal vaginal discharge. ? The discharge is white, gray, or yellow with bacterial vaginosis. ? The discharge is thick, white, and cheesy with a yeast infection. ? The discharge is frothy and yellow or greenish with trichomoniasis.  A bad vaginal smell. The smell is fishy with bacterial vaginosis.  Vaginal itching, pain, or swelling.  Sex that is painful.  Pain or burning when urinating.  Sometimes there are no symptoms. How is this diagnosed? This condition is diagnosed based on your symptoms and medical history. A physical exam, including a pelvic exam, will also be done. You may also have other tests, including:  Tests to determine the pH level (acidity or alkalinity) of your vagina.  A whiff test, to assess the odor that results when a sample of your vaginal discharge is mixed with a potassium hydroxide solution.  Tests of vaginal fluid. A sample will be examined under a microscope.  How is this treated? Treatment varies depending on the type of vaginitis you have. Your treatment may include:  Antibiotic creams or pills to treat bacterial vaginosis and trichomoniasis.  Antifungal medicines, such as vaginal creams or suppositories, to treat a yeast infection.  Medicine to ease discomfort if you have viral vaginitis. Your sexual partner should also be treated.  Estrogen delivered in a cream, pill, suppository, or vaginal ring to treat atrophic vaginitis. If vaginal dryness occurs, lubricants and moisturizing creams may help. You may need to avoid scented soaps, sprays, or douches.  Stopping use of a product that is causing allergic vaginitis. Then using a vaginal  cream to treat the symptoms.  Follow these instructions at home: Lifestyle  Keep your genital area clean and dry. Avoid soap, and only rinse the area  with water.  Do not douche or use tampons until your health care provider says it is okay to do so. Use sanitary pads, if needed.  Do not have sex until your health care provider approves. When you can return to sex, practice safe sex and use condoms.  Wipe from front to back. This avoids the spread of bacteria from the rectum to the vagina. General instructions  Take over-the-counter and prescription medicines only as told by your health care provider.  If you were prescribed an antibiotic medicine, take or use it as told by your health care provider. Do not stop taking or using the antibiotic even if you start to feel better.  Keep all follow-up visits as told by your health care provider. This is important. How is this prevented?  Use mild, non-scented products. Do not use things that can irritate the vagina, such as fabric softeners. Avoid the following products if they are scented: ? Feminine sprays. ? Detergents. ? Tampons. ? Feminine hygiene products. ? Soaps or bubble baths.  Let air reach your genital area. ? Wear cotton underwear to reduce moisture buildup. ? Avoid wearing underwear while you sleep. ? Avoid wearing tight pants and underwear or nylons without a cotton panel. ? Avoid wearing thong underwear.  Take off any wet clothing, such as bathing suits, as soon as possible.  Practice safe sex and use condoms. Contact a health care provider if:  You have abdominal pain.  You have a fever.  You have symptoms that last for more than 2-3 days. Get help right away if:  You have a fever and your symptoms suddenly get worse. Summary  Vaginitis is a condition in which the vaginal tissue becomes inflamed.This condition is most often caused by a change in the normal balance of bacteria and yeast that live in the vagina.  Treatment varies depending on the type of vaginitis you have.  Do not douche, use tampons , or have sex until your health care provider approves.  When you can return to sex, practice safe sex and use condoms. This information is not intended to replace advice given to you by your health care provider. Make sure you discuss any questions you have with your health care provider. Document Released: 02/07/2007 Document Revised: 05/18/2016 Document Reviewed: 05/18/2016 Elsevier Interactive Patient Education  2018 [place] WHITE: Soap: UNSCENTED Dove (white box light green writing) Laundry detergent (underwear)- Dreft or [employer] unscented WHITE 100% cotton panties (NOT just cotton crouch) Sanitary napkin/panty liners: UNSCENTED.  If it doesn't SAY unscented it can have a scent/perfume    NO PERFUMES OR LOTIONS OR POTIONS in the vulvar area (may use regular [place]) Condoms: hypoallergenic only. Non dyed (no color) Toilet papers: white only Wash clothes: use a separate wash cloth. WHITE.  Washed in Dreft.

## 2017-12-20 NOTE — Progress Notes (Signed)
History:  37 y.o. G2P1101 here today for vaginal odor and 'horrible' smell. Sx have been present for 6 weeks. Pt has a h/o freq BV but, has not had sx recently until presently.  Pt is not currently sexually active.   The following portions of the patient's history were reviewed and updated as appropriate: allergies, current medications, past family history, past medical history, past social history, past surgical history and problem list.  Review of Systems:  Pertinent items are noted in HPI.    Objective:  Physical Exam Blood pressure 127/73, pulse 73, weight (!) 386 lb 9.6 oz (175.4 kg), last menstrual period 11/28/2017, currently breastfeeding.  CONSTITUTIONAL: Well-developed, well-nourished female in no acute distress.  HENT:  Normocephalic, atraumatic EYES: Conjunctivae and EOM are normal. No scleral icterus.  NECK: Normal range of motion SKIN: Skin is warm and dry. No rash noted. Not diaphoretic.No pallor. [place]: Alert and oriented to person, place, and time. Normal coordination.  Pelvic: Normal appearing external genitalia; normal appearing vaginal mucosa and cervix.  Normal discharge.    Assessment & Plan:  Vaginitis:  Wet mount obtained today  Will f/u and treat if indicated  Want gel if indicated   Bearl Talarico L. Harraway-Smith, M.D., Cherlynn June

## 2017-12-21 LAB — CERVICOVAGINAL ANCILLARY ONLY
Bacterial vaginitis: NEGATIVE
CHLAMYDIA, DNA PROBE: NEGATIVE
Candida vaginitis: NEGATIVE
NEISSERIA GONORRHEA: NEGATIVE
TRICH (WINDOWPATH): NEGATIVE

## 2018-02-11 ENCOUNTER — Encounter (HOSPITAL_COMMUNITY): Payer: Self-pay | Admitting: Emergency Medicine

## 2018-02-11 ENCOUNTER — Emergency Department (HOSPITAL_COMMUNITY)
Admission: EM | Admit: 2018-02-11 | Discharge: 2018-02-11 | Disposition: A | Discharge status: Home / Self Care | Payer: Medicaid Other | Attending: Emergency Medicine | Admitting: Emergency Medicine

## 2018-02-11 ENCOUNTER — Emergency Department (HOSPITAL_COMMUNITY): Payer: Medicaid Other

## 2018-02-11 ENCOUNTER — Other Ambulatory Visit: Payer: Self-pay

## 2018-02-11 DIAGNOSIS — Z87891 Personal history of nicotine dependence: Secondary | ICD-10-CM | POA: Insufficient documentation

## 2018-02-11 DIAGNOSIS — J069 Acute upper respiratory infection, unspecified: Secondary | ICD-10-CM | POA: Insufficient documentation

## 2018-02-11 DIAGNOSIS — B9789 Other viral agents as the cause of diseases classified elsewhere: Secondary | ICD-10-CM

## 2018-02-11 DIAGNOSIS — R05 Cough: Secondary | ICD-10-CM | POA: Diagnosis present

## 2018-02-11 MED ORDER — ALBUTEROL SULFATE HFA 108 (90 BASE) MCG/ACT IN AERS: 2.0000 | INHALATION_SPRAY | RESPIRATORY_TRACT | 0 refills | Status: DC | PRN

## 2018-02-11 MED ORDER — AEROCHAMBER PLUS FLO-VU SMALL MISC: 1.0000 | Freq: Once | 0 refills | Status: AC

## 2018-02-11 MED ORDER — GUAIFENESIN 400 MG PO TABS: 400.0000 mg | ORAL_TABLET | ORAL | 0 refills | Status: DC

## 2018-02-11 MED ORDER — PROMETHAZINE-DM 6.25-15 MG/5ML PO SYRP: 5.0000 mL | ORAL_SOLUTION | Freq: Four times a day (QID) | ORAL | 0 refills | Status: DC | PRN

## 2018-02-11 NOTE — ED Notes (Signed)
Pt in peds triage with peds pt.

## 2018-02-11 NOTE — ED Notes (Signed)
Per another pt that is with her- she is in peds with a child

## 2018-02-11 NOTE — Discharge Instructions (Addendum)
Thank you for allowing me to care for you today in the Emergency Department.   Take 2 puffs of the albuterol inhaler with a spacer every 4 hours as needed for wheezing or shortness of breath.  Take 1 tablet of guaifenesin every 4 hours as needed for cough.  You can also use 5 mL's of promethazine dextromethorphan every 6 hours as needed for cough or nasal congestion.  These 2 medications are safe to use together.  You can take 600 mg of ibuprofen with food or 650 mg of Tylenol once every 6 hours for headache, pain, or body aches.  If your symptoms do not improve in the next 1 to 2 weeks, you can follow-up with your primary care provider.  Return to the emergency department if you develop significantly worsening symptoms or symptoms such as severe shortness of breath, high fever, persistent vomiting, or other new, concerning symptoms.

## 2018-02-11 NOTE — ED Notes (Signed)
Patient transported to X-ray 

## 2018-02-11 NOTE — ED Notes (Signed)
No answer in waiting room 

## 2018-02-11 NOTE — ED Triage Notes (Signed)
C/o non-productive cough x 2 weeks.  Denies nasal congestion, chills, or fever.

## 2018-02-11 NOTE — ED Provider Notes (Signed)
[facility] EMERGENCY DEPARTMENT Provider Note   CSN: [id number] Arrival date & time: 02/11/18  0308     History   Chief Complaint Chief Complaint  Patient presents with  . Cough    HPI Diana Blair is a 37 y.o. female with a history of diverticulitis, heart palpitations, morbid obesity, and PCOS who presents to the emergency department with a chief complaint of cough.  The patient endorses 2 weeks of nonproductive cough.  She is concerned that she may have pneumonia as she previously had multifocal pneumonia earlier this year.  She reports that she was diagnosed with hand-foot-and-mouth disease that resolved 2 weeks ago after she contracted it from her 63-month-old daughter.  She denies fever, chills, dyspnea, but reports generalized malaise.  No abdominal pain, nausea, vomiting, diarrhea, congestion, myalgias, conjunctivitis, sinus pain or pressure, or otalgia.  No treatment prior to arrival.  Not received her flu shot.  Sick contacts include her husband and young daughter.  The history is provided by the patient. No language interpreter was used.    Past Medical History:  Diagnosis Date  . 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 Diverticulitis   . Heart palpitations   . Morbid obesity with BMI of 50.0-59.9, adult (HCC)    BMI 58.8  . PCOS (polycystic ovarian syndrome)   . Seizure [facility])    as a child    Patient Active Problem List   Diagnosis Date Noted  . Advanced maternal age in multigravida 11/10/2016  . Chronic hypertension in pregnancy 11/10/2016  . IUGR (intrauterine growth restriction) affecting care of mother, third trimester, other fetus 11/05/2016  . BMI 60.0-69.9, adult ([place]) 05/04/2016  . History of preterm delivery 08/20/2015  . History of cervical cerclage 08/20/2015  . PCOS (polycystic ovarian syndrome) 02/08/2013    Past Surgical History:  Procedure Laterality Date  . CERVICAL CERCLAGE   03/13/2012   Procedure: CERCLAGE CERVICAL;  Surgeon: Jonnie Kind, MD;  Location: [facility];  Service: Gynecology;  Laterality: N/A;  . CERVICAL CERCLAGE  03/17/2012   Procedure: CERCLAGE CERVICAL;  Surgeon: Osborne Oman, MD;  Location: [facility];  Service: Gynecology;  Laterality: N/A;  Removal     OB History    Gravida  2   Para  2   Term  1   Preterm  1   AB  0   Living  1     SAB  0   TAB  0   Ectopic  0   Multiple  0   Live Births  2            Home Medications    Prior to Admission medications   Medication Sig Start Date End Date Taking? Authorizing Provider  albuterol (PROVENTIL HFA;VENTOLIN HFA) 108 (90 Base) MCG/ACT inhaler Inhale 2 puffs into the lungs every 4 (four) hours as needed for wheezing or shortness of breath. 02/11/18   Malgorzata Albert A, PA-C  guaifenesin (HUMIBID E) 400 MG TABS tablet Take 1 tablet (400 mg total) by mouth every 4 (four) hours. 02/11/18   Kalven Ganim A, PA-C  promethazine-dextromethorphan (PROMETHAZINE-DM) 6.25-15 MG/5ML syrup Take 5 mLs by mouth 4 (four) times daily as needed for cough. 02/11/18   Yazmeen Woolf A, PA-C  Spacer/Aero-Holding Chambers (AEROCHAMBER PLUS FLO-VU SMALL) MISC 1 each by Other route once for 1 dose. 02/11/18 02/11/18  Donae Kueker, Laymond Purser, PA-C    Family History Family History  Problem Relation Age of  Onset  . Cancer Mother        lung  . Hypertension Mother   . Diabetes Father   . Heart failure Father   . Diabetes Paternal Grandmother   . Hypertension Sister   . Hypertension Brother   . Rashes / Skin problems Sister   . Other Neg Hx     Social History Social History   Tobacco Use  . Smoking status: Former Smoker    Types: Cigarettes    Last attempt to quit: 02/08/2009    Years since quitting: 9.0  . Smokeless tobacco: Never Used  Substance Use Topics  . Alcohol use: No  . Drug use: No     Allergies   Patient has no known allergies.   Review of Systems Review of Systems    Constitutional: Negative for activity change, chills and fever.       Malaise  HENT: Negative for congestion, ear pain, sinus pressure, sinus pain and sneezing.   Respiratory: Positive for cough. Negative for shortness of breath.   Cardiovascular: Negative for chest pain.  Gastrointestinal: Negative for abdominal pain, diarrhea, nausea and vomiting.  Musculoskeletal: Negative for back pain.  Skin: Negative for rash.     Physical Exam Updated Vital Signs BP (!) 151/75 (BP Location: Right Wrist)   Pulse (!) 105   Temp 98.9 F (37.2 C) (Oral)   Resp 20   LMP 01/24/2018   SpO2 100%   Physical Exam  Constitutional: No distress.  Morbid obesity.   HENT:  Head: Normocephalic.  Right Ear: External ear normal.  Left Ear: External ear normal.  Nose: Nose normal.  Mouth/Throat: Oropharynx is clear and moist. No oropharyngeal exudate.  Eyes: Pupils are equal, round, and reactive to light. Conjunctivae and EOM are normal. Right eye exhibits no discharge. Left eye exhibits no discharge. No scleral icterus.  Neck: Normal range of motion. Neck supple.  Cardiovascular: Normal rate, regular rhythm, normal heart sounds and intact distal pulses. Exam reveals no gallop and no friction rub.  No murmur heard. Pulmonary/Chest: Effort normal and breath sounds normal. No stridor. No respiratory distress. She has no wheezes. She has no rales. She exhibits no tenderness.  Abdominal: Soft. She exhibits no distension.  Lymphadenopathy:    She has no cervical adenopathy.  Neurological: She is alert.  Skin: Skin is warm. No rash noted. She is not diaphoretic.  Psychiatric: Her behavior is normal.  Nursing note and vitals reviewed.    ED Treatments / Results  Labs (all labs ordered are listed, but only abnormal results are displayed) Labs Reviewed - No data to display  EKG None  Radiology Dg Chest 2 View  Result Date: 02/11/2018 CLINICAL DATA:  37 y/o  F; 2 weeks of nonproductive cough.  EXAM: CHEST - 2 VIEW COMPARISON:  08/10/2017 chest radiograph. FINDINGS: Stable normal cardiac silhouette given projection and technique. Few faint streaky opacities are present in the areas of prior consolidation likely representing postinflammatory scarring. No new consolidation identified. No pleural effusion or pneumothorax. Bones are unremarkable. IMPRESSION: No acute pulmonary process identified. Electronically Signed   By: Kristine Garbe M.D.   On: 02/11/2018 03:50    Procedures Procedures (including critical care time)  Medications Ordered in ED Medications - No data to display   Initial Impression / Assessment and Plan / ED Course  I have reviewed the triage vital signs and the nursing notes.  Pertinent labs & imaging results that were available during my care of the patient were  reviewed by me and considered in my medical decision making (see chart for details).     37 year old female with a history of diverticulitis, heart palpitations, morbid obesity, and PCOS with nonproductive cough and generalized malaise for 2 weeks.  The patient reports that she previously had hand-foot-and-mouth disease that she contracted from her daughter that resolved about 2 weeks ago.  She is concerned that she may have pneumonia.  Chest x-ray is negative.  On exam, lungs are also clear to auscultation bilaterally and she is well-appearing.  No other focal abnormalities on exam.  She reports that she previously had an albuterol inhaler and would like a refill, which has been given in the ED.  Given that her husband and daughter are also ill with similar symptoms, suspect viral URI at this time.  Will discharge with symptomatic treatment.  She is hemodynamically stable and in no acute distress.  She is safe for discharge home with outpatient follow-up.  Final Clinical Impressions(s) / ED Diagnoses   Final diagnoses:  Viral URI with cough    ED Discharge Orders         Ordered     Spacer/Aero-Holding Chambers (AEROCHAMBER PLUS FLO-VU SMALL) MISC   Once     02/11/18 0449    albuterol (PROVENTIL HFA;VENTOLIN HFA) 108 (90 Base) MCG/ACT inhaler  Every 4 hours PRN     02/11/18 0449    guaifenesin (HUMIBID E) 400 MG TABS tablet  Every 4 hours     02/11/18 0449    promethazine-dextromethorphan (PROMETHAZINE-DM) 6.25-15 MG/5ML syrup  4 times daily PRN     02/11/18 0449           Joanne Gavel, PA-C 51/10/21 1173    Delora Fuel, MD 56/70/14 2236

## 2018-04-01 ENCOUNTER — Other Ambulatory Visit: Payer: Self-pay

## 2018-04-01 ENCOUNTER — Emergency Department (HOSPITAL_COMMUNITY): Payer: Medicaid Other

## 2018-04-01 ENCOUNTER — Emergency Department (HOSPITAL_COMMUNITY)
Admission: EM | Admit: 2018-04-01 | Discharge: 2018-04-01 | Disposition: A | Discharge status: Home / Self Care | Payer: Medicaid Other | Attending: Emergency Medicine | Admitting: Emergency Medicine

## 2018-04-01 ENCOUNTER — Encounter (HOSPITAL_COMMUNITY): Payer: Self-pay

## 2018-04-01 DIAGNOSIS — R059 Cough, unspecified: Secondary | ICD-10-CM

## 2018-04-01 DIAGNOSIS — M940 Chondrocostal junction syndrome [Tietze]: Secondary | ICD-10-CM | POA: Diagnosis not present

## 2018-04-01 DIAGNOSIS — Z87891 Personal history of nicotine dependence: Secondary | ICD-10-CM | POA: Diagnosis not present

## 2018-04-01 DIAGNOSIS — R05 Cough: Secondary | ICD-10-CM | POA: Diagnosis present

## 2018-04-01 DIAGNOSIS — Z79899 Other long term (current) drug therapy: Secondary | ICD-10-CM | POA: Insufficient documentation

## 2018-04-01 DIAGNOSIS — R0789 Other chest pain: Secondary | ICD-10-CM

## 2018-04-01 DIAGNOSIS — J40 Bronchitis, not specified as acute or chronic: Secondary | ICD-10-CM | POA: Insufficient documentation

## 2018-04-01 DIAGNOSIS — K219 Gastro-esophageal reflux disease without esophagitis: Secondary | ICD-10-CM | POA: Insufficient documentation

## 2018-04-01 DIAGNOSIS — R0602 Shortness of breath: Secondary | ICD-10-CM

## 2018-04-01 LAB — BASIC METABOLIC PANEL
ANION GAP: 8 (ref 5–15)
BUN: 10 mg/dL (ref 6–20)
CHLORIDE: 106 mmol/L (ref 98–111)
CO2: 25 mmol/L (ref 22–32)
Calcium: 8.8 mg/dL — ABNORMAL LOW (ref 8.9–10.3)
Creatinine, Ser: 0.79 mg/dL (ref 0.44–1.00)
GFR calc Af Amer: >60 mL/min (ref >60)
GLUCOSE: 117 mg/dL — AB (ref 70–99)
POTASSIUM: 3.7 mmol/L (ref 3.5–5.1)
Sodium: 139 mmol/L (ref 135–145)

## 2018-04-01 LAB — CBC WITH DIFFERENTIAL/PLATELET
ABS IMMATURE GRANULOCYTES: 0.02 10*3/uL (ref 0.00–0.07)
BASOS PCT: 0 %
Basophils Absolute: 0 10*3/uL (ref 0.0–0.1)
Eosinophils Absolute: 0.2 10*3/uL (ref 0.0–0.5)
Eosinophils Relative: 3 %
HCT: 39.3 % (ref 36.0–46.0)
HEMOGLOBIN: 12.1 g/dL (ref 12.0–15.0)
IMMATURE GRANULOCYTES: 0 %
LYMPHS ABS: 0.6 10*3/uL — AB (ref 0.7–4.0)
LYMPHS PCT: 11 %
MCH: 26.5 pg (ref 26.0–34.0)
MCHC: 30.8 g/dL (ref 30.0–36.0)
MCV: 86 fL (ref 80.0–100.0)
MONOS PCT: 8 %
Monocytes Absolute: 0.5 10*3/uL (ref 0.1–1.0)
NEUTROS ABS: 4.3 10*3/uL (ref 1.7–7.7)
NEUTROS PCT: 78 %
PLATELETS: 265 10*3/uL (ref 150–400)
RBC: 4.57 MIL/uL (ref 3.87–5.11)
RDW: 15.4 % (ref 11.5–15.5)
WBC: 5.6 10*3/uL (ref 4.0–10.5)
nRBC: 0 % (ref 0.0–0.2)

## 2018-04-01 LAB — I-STAT TROPONIN, ED: Troponin i, poc: 0 ng/mL (ref 0.00–0.08)

## 2018-04-01 MED ORDER — BENZONATATE 200 MG PO CAPS: 200.0000 mg | ORAL_CAPSULE | Freq: Three times a day (TID) | ORAL | 0 refills | Status: DC | PRN

## 2018-04-01 MED ORDER — ALBUTEROL SULFATE HFA 108 (90 BASE) MCG/ACT IN AERS: 1.0000 | INHALATION_SPRAY | RESPIRATORY_TRACT | 0 refills | Status: DC | PRN

## 2018-04-01 MED ORDER — IPRATROPIUM BROMIDE 0.02 % IN SOLN
0.5000 mg | Freq: Once | RESPIRATORY_TRACT | Status: AC
  Administered 2018-04-01: 0.5 mg via RESPIRATORY_TRACT
  Filled 2018-04-01: qty 2.5

## 2018-04-01 MED ORDER — AZITHROMYCIN 250 MG PO TABS: ORAL_TABLET | ORAL | 0 refills | Status: DC

## 2018-04-01 MED ORDER — ALBUTEROL SULFATE (2.5 MG/3ML) 0.083% IN NEBU
5.0000 mg | INHALATION_SOLUTION | Freq: Once | RESPIRATORY_TRACT | Status: AC
  Administered 2018-04-01: 5 mg via RESPIRATORY_TRACT
  Filled 2018-04-01: qty 6

## 2018-04-01 MED ORDER — FAMOTIDINE 20 MG PO TABS: 20.0000 mg | ORAL_TABLET | Freq: Two times a day (BID) | ORAL | 0 refills | Status: DC

## 2018-04-01 NOTE — ED Provider Notes (Signed)
[facility] DEPT Provider Note   CSN: [id number] Arrival date & time: 04/01/18  1703     History   Chief Complaint Chief Complaint  Patient presents with  . Cough  . Chest Pain    HPI Diana Blair is a 37 y.o. female with a PMHx of PCOS, obesity, diverticulitis, and other conditions listed below, who presents to the ED with complaints of ongoing cough x2 months worse x2 weeks.  Chart review reveals she was seen in the ED on 02/11/18 for several weeks of cough after having hand-foot-mouth disease, CXR was negative at that time, she was diagnosed with viral URI with cough, prescribed cough medications and albuterol and discharged home.  She states that after that her cough initially started to improve however was still present, but 2 weeks ago she started feeling "like she was getting a cold" and had a productive cough with clear sputum with occasional green specks.  After that the cough became more nonproductive again however has continued to persist.  She has started to have occasional wheezing and sometimes gets short of breath with exertion.  She states that from all the coughing she has had left rib cage pain from the front of her left chest to the left side of her back along the rib cage under her breast, which she describes as a 2/10 constant aching pain that worsens with coughing.  She has tried DayQuil, Promethazine DM, cough drops, as well as multiple other over-the-counter cough medicines without relief of her symptoms.  She is concerned because she recently had pneumonia earlier this year and wants to make sure that she does not have pneumonia, she states that after she had hand-foot-and-mouth disease all of her symptoms started and she is concerned that that could be related to why she continues to have a cough.  She denies any fevers, chills, other URI symptoms, LE swelling, hemoptysis, recent travel/surgery/immobilization, estrogen use, personal or  family history of DVT/PE, abdominal pain, nausea, vomiting, diarrhea, constipation, dysuria, hematuria, myalgias, arthralgias, numbness, tingling, focal weakness, claudication, orthopnea, or any other complaints at this time.  She is a non-smoker.  Her father had an MI but she is not sure how old he was, no other known family history of cardiac disease.  The history is provided by the patient and medical records. No language interpreter was used.  Cough  Associated symptoms include chest pain, shortness of breath (with exertion) and wheezing. Pertinent negatives include no chills and no myalgias.  Chest Pain   Associated symptoms include cough and shortness of breath (with exertion). Pertinent negatives include no abdominal pain, no fever, no nausea, no numbness, no vomiting and no weakness.    Past Medical History:  Diagnosis Date  . 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 Diverticulitis   . Heart palpitations   . Morbid obesity with BMI of 50.0-59.9, adult (HCC)    BMI 58.8  . PCOS (polycystic ovarian syndrome)   . Seizure [facility])    as a child    Patient Active Problem List   Diagnosis Date Noted  . Advanced maternal age in multigravida 11/10/2016  . Chronic hypertension in pregnancy 11/10/2016  . IUGR (intrauterine growth restriction) affecting care of mother, third trimester, other fetus 11/05/2016  . BMI 60.0-69.9, adult ([place]) 05/04/2016  . History of preterm delivery 08/20/2015  . History of cervical cerclage 08/20/2015  . PCOS (polycystic ovarian syndrome) 02/08/2013    Past Surgical History:  Procedure Laterality Date  . CERVICAL CERCLAGE  03/13/2012   Procedure: CERCLAGE CERVICAL;  Surgeon: Jonnie Kind, MD;  Location: [facility];  Service: Gynecology;  Laterality: N/A;  . CERVICAL CERCLAGE  03/17/2012   Procedure: CERCLAGE CERVICAL;  Surgeon: Osborne Oman, MD;  Location: [facility];  Service: Gynecology;  Laterality: N/A;  Removal      OB History    Gravida  2   Para  2   Term  1   Preterm  1   AB  0   Living  1     SAB  0   TAB  0   Ectopic  0   Multiple  0   Live Births  2            Home Medications    Prior to Admission medications   Medication Sig Start Date End Date Taking? Authorizing Provider  albuterol (PROVENTIL HFA;VENTOLIN HFA) 108 (90 Base) MCG/ACT inhaler Inhale 2 puffs into the lungs every 4 (four) hours as needed for wheezing or shortness of breath. 02/11/18   McDonald, Mia A, PA-C  guaifenesin (HUMIBID E) 400 MG TABS tablet Take 1 tablet (400 mg total) by mouth every 4 (four) hours. 02/11/18   McDonald, Mia A, PA-C  promethazine-dextromethorphan (PROMETHAZINE-DM) 6.25-15 MG/5ML syrup Take 5 mLs by mouth 4 (four) times daily as needed for cough. 02/11/18   McDonald, Laymond Purser, PA-C    Family History Family History  Problem Relation Age of Onset  . Cancer Mother        lung  . Hypertension Mother   . Diabetes Father   . Heart failure Father   . Diabetes Paternal Grandmother   . Hypertension Sister   . Hypertension Brother   . Rashes / Skin problems Sister   . Other Neg Hx     Social History Social History   Tobacco Use  . Smoking status: Former Smoker    Types: Cigarettes    Last attempt to quit: 02/08/2009    Years since quitting: 9.1  . Smokeless tobacco: Never Used  Substance Use Topics  . Alcohol use: No  . Drug use: No     Allergies   Patient has no known allergies.   Review of Systems Review of Systems  Constitutional: Negative for chills and fever.  Respiratory: Positive for cough, shortness of breath (with exertion) and wheezing.   Cardiovascular: Positive for chest pain. Negative for leg swelling.  Gastrointestinal: Negative for abdominal pain, constipation, diarrhea, nausea and vomiting.  Genitourinary: Negative for dysuria and hematuria.  Musculoskeletal: Negative for arthralgias and myalgias.  Skin: Negative for color change.   Allergic/Immunologic: Negative for immunocompromised state.  Neurological: Negative for weakness and numbness.  Psychiatric/Behavioral: Negative for confusion.   All other systems reviewed and are negative for acute change except as noted in the HPI.    Physical Exam Updated Vital Signs BP 100/80 (BP Location: Left Arm)   Pulse 96   Temp 99.2 F (37.3 C) (Oral)   Resp 18   Wt (!) 163.3 kg   SpO2 100%   BMI 63.77 kg/m   Physical Exam  Constitutional: She is oriented to person, place, and time. Vital signs are normal. She appears well-developed and well-nourished.  Non-toxic appearance. No distress.  Afebrile, nontoxic, NAD, morbidly obese  HENT:  Head: Normocephalic and atraumatic.  Mouth/Throat: Oropharynx is clear and moist and mucous membranes are normal.  Eyes: Conjunctivae and EOM are normal. Right eye exhibits no discharge.  Left eye exhibits no discharge.  Neck: Normal range of motion. Neck supple.  Cardiovascular: Normal rate, regular rhythm, normal heart sounds and intact distal pulses. Exam reveals no gallop and no friction rub.  No murmur heard. RRR, nl s1/s2, no m/r/g, distal pulses intact, no pedal edema appreciated although body habitus limits exam slightly  Pulmonary/Chest: Effort normal. No respiratory distress. She has no decreased breath sounds. She has wheezes. She has no rhonchi. She has no rales. She exhibits tenderness. She exhibits no crepitus, no deformity and no retraction.  Morbidly obese which limits exam slightly but some faint expiratory wheezing appreciated in the upper fields bilaterally, no rhonchi/rales appreciated, no hypoxia or increased WOB, speaking in full sentences, SpO2 100% on RA Chest wall with mild TTP along the L rib cage margin/subxyphoid region tracking along the lower L rib cage, without crepitus, deformities, or retractions     Abdominal: Soft. Normal appearance and bowel sounds are normal. She exhibits no distension. There is no  tenderness. There is no rigidity, no rebound, no guarding, no CVA tenderness, no tenderness at McBurney's point and negative Murphy's sign.  Musculoskeletal: Normal range of motion.  Neurological: She is alert and oriented to person, place, and time. She has normal strength. No sensory deficit.  Skin: Skin is warm, dry and intact. No rash noted.  Psychiatric: She has a normal mood and affect.  Nursing note and vitals reviewed.    ED Treatments / Results  Labs (all labs ordered are listed, but only abnormal results are displayed) Labs Reviewed  CBC WITH DIFFERENTIAL/PLATELET - Abnormal; Notable for the following components:      Result Value   Lymphs Abs 0.6 (*)    All other components within normal limits  BASIC METABOLIC PANEL - Abnormal; Notable for the following components:   Glucose, Bld 117 (*)    Calcium 8.8 (*)    All other components within normal limits  I-STAT TROPONIN, ED    EKG EKG Interpretation  Date/Time:  Saturday April 01 2018 18:04:24 EST Ventricular Rate:  83 PR Interval:    QRS Duration: 88 QT Interval:  370 QTC Calculation: 435 R Axis:   63 Text Interpretation:  Sinus rhythm Confirmed by Virgel Manifold [phone]) on 04/01/2018 7:21:07 PM   Radiology No results found.  Procedures Procedures (including critical care time)  Medications Ordered in ED Medications  albuterol (PROVENTIL) (2.5 MG/3ML) 0.083% nebulizer solution 5 mg (5 mg Nebulization Given 04/01/18 1815)  ipratropium (ATROVENT) nebulizer solution 0.5 mg (0.5 mg Nebulization Given 04/01/18 1815)     Initial Impression / Assessment and Plan / ED Course  I have reviewed the triage vital signs and the nursing notes.  Pertinent labs & imaging results that were available during my care of the patient were reviewed by me and considered in my medical decision making (see chart for details).     37 y.o. female here with cough x2 months that got worse x2 wks ago. States persistent cough is  aggravating and interfering with her life. Reports having chest and back pain from coughing, and occasional SOB with exertion as well as occasional wheezing. On exam, morbidly obese which limits exam slightly, but lungs with faint expiratory wheeze in upper fields, no rhonchi or rales, no hypoxia or increased WOB, afebrile and nontoxic appearing. Mild L rib cage margin TTP. No pedal edema noted. No tachycardia. Overall, could be PNA vs bronchitis vs silent reflux, etc. Will get basic labs, CXR, EKG, and give nebs; doubt PE,  doubt ACS, doubt need for other additional labs/imaging/meds at this time. Will reassess shortly.   7:40 PM Lung sounds improved after nebs, pt feeling better. CBC w/diff WNL. BMP essentially WNL. Trop neg. EKG unremarkable. CXR with chronic appearing bronchitic changes and patchy upper lobe scarring but without acute findings. Overall, could be viral cough vs silent reflux, however given recent worsening in symptoms and bronchitic findings on exam, will treat for bronchitis as well. Will send home with tesslon to help with cough suppression since this is her biggest complaint, and send home with pepcid in addition to azithromycin for atypical bronchitis. Advised GERD diet/lifestyle modifications. Will give albuterol inhaler. Discussed other OTC remedies for symptomatic relief. Advised f/up with PCP in 1wk for recheck. I explained the diagnosis and have given explicit precautions to return to the ER including for any other new or worsening symptoms. The patient understands and accepts the medical plan as it's been dictated and I have answered their questions. Discharge instructions concerning home care and prescriptions have been given. The patient is STABLE and is discharged to home in good condition.    Final Clinical Impressions(s) / ED Diagnoses   Final diagnoses:  Bronchitis  Cough  Costochondritis  Chest wall pain  SOB (shortness of breath)  Gastroesophageal reflux disease,  esophagitis presence not specified    ED Discharge Orders         Ordered    benzonatate (TESSALON) 200 MG capsule  3 times daily PRN     04/01/18 1939    albuterol (PROVENTIL HFA;VENTOLIN HFA) 108 (90 Base) MCG/ACT inhaler  Every 4 hours PRN     04/01/18 1939    famotidine (PEPCID) 20 MG tablet  2 times daily     04/01/18 1939    azithromycin (ZITHROMAX Z-PAK) 250 MG tablet     04/01/18 [address], [place], [place] 04/01/18 1940    Virgel Manifold, MD 04/01/18 2027

## 2018-04-01 NOTE — ED Triage Notes (Signed)
Pt reports that she has had a cough for 2 months but has worsened in the last 2 weeks. Pt was dx with a viral infection in oct. The virus progressed to bacterial infection. Pt reports back and chest pain due to the coughing. Pt reports the cough is mostly non productive.

## 2018-04-01 NOTE — Discharge Instructions (Addendum)
Your labs and EKG are all reassuring. Your chest xray shows that you mild have bronchitis, take the antibiotic as directed until completed. Your cough could also be from silent reflux, start taking pepcid as directed. You may use over the counter tums, maalox, pepto bismol, etc to help with symptoms; avoid spicy/fatty/fried/acidic foods. Avoid laying down 30 minutes after a meal. Avoid alcohol, tea, soda, etc. See the list of foods below to help with reflux.   Use tessalon as directed to help with cough. Use albuterol inhaler as directed as needed for cough, shortness of breath, etc. Use over the counter cough medications as needed for cough relief. Your chest pain is likely from the fact that you're coughing so much. Alternate between tylenol and motrin as directed as needed for pain, always taking these on a full stomach and NEVER on an empty stomach. Stay well hydrated. You may consider using heat to the areas of pain, no more than 20 minutes every hour.   Follow up with your regular doctor in 1 week for recheck of symptoms. Return to the ER for changes or worsening symptoms.  SEEK IMMEDIATE MEDICAL ATTENTION IF: You develop a fever.  Your chest pains become severe or intolerable.  You develop new, unexplained symptoms (problems).  You develop shortness of breath, nausea, vomiting, sweating or feel light headed.  You develop a new cough or you cough up blood. You develop new leg swelling

## 2018-04-01 NOTE — ED Notes (Signed)
Patient transported to X-ray 

## 2018-07-05 ENCOUNTER — Other Ambulatory Visit: Payer: Self-pay

## 2018-07-05 ENCOUNTER — Encounter (HOSPITAL_COMMUNITY): Payer: Self-pay | Admitting: Emergency Medicine

## 2018-07-05 ENCOUNTER — Emergency Department (HOSPITAL_COMMUNITY): Payer: Medicaid Other

## 2018-07-05 ENCOUNTER — Emergency Department (HOSPITAL_COMMUNITY)
Admission: EM | Admit: 2018-07-05 | Discharge: 2018-07-06 | Disposition: A | Discharge status: Home / Self Care | Payer: Medicaid Other | Attending: Emergency Medicine | Admitting: Emergency Medicine

## 2018-07-05 DIAGNOSIS — Z87891 Personal history of nicotine dependence: Secondary | ICD-10-CM | POA: Diagnosis not present

## 2018-07-05 DIAGNOSIS — Z79899 Other long term (current) drug therapy: Secondary | ICD-10-CM | POA: Insufficient documentation

## 2018-07-05 DIAGNOSIS — J069 Acute upper respiratory infection, unspecified: Secondary | ICD-10-CM | POA: Insufficient documentation

## 2018-07-05 DIAGNOSIS — J029 Acute pharyngitis, unspecified: Secondary | ICD-10-CM | POA: Diagnosis present

## 2018-07-05 LAB — CBC
HCT: 40.4 % (ref 36.0–46.0)
Hemoglobin: 12.5 g/dL (ref 12.0–15.0)
MCH: 26.4 pg (ref 26.0–34.0)
MCHC: 30.9 g/dL (ref 30.0–36.0)
MCV: 85.4 fL (ref 80.0–100.0)
Platelets: 265 10*3/uL (ref 150–400)
RBC: 4.73 MIL/uL (ref 3.87–5.11)
RDW: 15.7 % — ABNORMAL HIGH (ref 11.5–15.5)
WBC: 5.9 10*3/uL (ref 4.0–10.5)
nRBC: 0 % (ref 0.0–0.2)

## 2018-07-05 LAB — BASIC METABOLIC PANEL
Anion gap: 9 (ref 5–15)
BUN: 15 mg/dL (ref 6–20)
CALCIUM: 8.9 mg/dL (ref 8.9–10.3)
CO2: 23 mmol/L (ref 22–32)
Chloride: 106 mmol/L (ref 98–111)
Creatinine, Ser: 0.94 mg/dL (ref 0.44–1.00)
GFR calc Af Amer: >60 mL/min (ref >60)
GFR calc non Af Amer: >60 mL/min (ref >60)
Glucose, Bld: 120 mg/dL — ABNORMAL HIGH (ref 70–99)
Potassium: 3.7 mmol/L (ref 3.5–5.1)
Sodium: 138 mmol/L (ref 135–145)

## 2018-07-05 LAB — I-STAT BETA HCG BLOOD, ED (NOT ORDERABLE): I-stat hCG, quantitative: <5 m[IU]/mL (ref <5)

## 2018-07-05 LAB — POCT I-STAT TROPONIN I: Troponin i, poc: 0.02 ng/mL (ref 0.00–0.08)

## 2018-07-05 MED ORDER — ALBUTEROL SULFATE (2.5 MG/3ML) 0.083% IN NEBU
5.0000 mg | INHALATION_SOLUTION | Freq: Once | RESPIRATORY_TRACT | Status: AC
  Administered 2018-07-06: 5 mg via RESPIRATORY_TRACT
  Filled 2018-07-05: qty 6

## 2018-07-05 MED ORDER — ALUM & MAG HYDROXIDE-SIMETH 200-200-20 MG/5ML PO SUSP
30.0000 mL | Freq: Once | ORAL | Status: AC
  Administered 2018-07-06: 30 mL via ORAL
  Filled 2018-07-05: qty 30

## 2018-07-05 MED ORDER — LIDOCAINE VISCOUS HCL 2 % MT SOLN
15.0000 mL | Freq: Once | OROMUCOSAL | Status: AC
  Administered 2018-07-06: 15 mL via ORAL
  Filled 2018-07-05: qty 15

## 2018-07-05 MED ORDER — IPRATROPIUM BROMIDE 0.02 % IN SOLN
0.5000 mg | Freq: Once | RESPIRATORY_TRACT | Status: AC
  Administered 2018-07-06: 0.5 mg via RESPIRATORY_TRACT
  Filled 2018-07-05: qty 2.5

## 2018-07-05 NOTE — ED Provider Notes (Signed)
[place] DEPT Provider Note   CSN: [id number] Arrival date & time: 07/05/18  1803    History   Chief Complaint Chief Complaint  Patient presents with  . Cough  . Chest Pain  . Back Pain    HPI Diana Blair is a 38 y.o. female.    38 year old female with a history of hypertension, PCOS, seizures, obesity presents to the emergency department for upper respiratory complaints.  She has noted some congestion as well as sore throat over the past 2 weeks.  Feels that her symptoms have been getting worse.  She has had a cough productive of brown phlegm at times, though mostly nonproductive despite use of over-the-counter Mucinex.  She has also tried DayQuil.  States that her husband and daughter have been sick with similar symptoms.  No fevers, syncope, hemoptysis.  The history is provided by the patient. No language interpreter was used.  Cough  Associated symptoms: chest pain   Chest Pain  Associated symptoms: back pain and cough   Back Pain  Associated symptoms: chest pain     Past Medical History:  Diagnosis Date  . 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 Diverticulitis   . Heart palpitations   . Morbid obesity with BMI of 50.0-59.9, adult (HCC)    BMI 58.8  . PCOS (polycystic ovarian syndrome)   . Seizure [facility])    as a child    Patient Active Problem List   Diagnosis Date Noted  . Advanced maternal age in multigravida 11/10/2016  . Chronic hypertension in pregnancy 11/10/2016  . IUGR (intrauterine growth restriction) affecting care of mother, third trimester, other fetus 11/05/2016  . BMI 60.0-69.9, adult ([place]) 05/04/2016  . History of preterm delivery 08/20/2015  . History of cervical cerclage 08/20/2015  . PCOS (polycystic ovarian syndrome) 02/08/2013    Past Surgical History:  Procedure Laterality Date  . CERVICAL CERCLAGE  03/13/2012   Procedure: CERCLAGE CERVICAL;  Surgeon: Jonnie Kind, MD;  Location: [facility];  Service: Gynecology;  Laterality: N/A;  . CERVICAL CERCLAGE  03/17/2012   Procedure: CERCLAGE CERVICAL;  Surgeon: Osborne Oman, MD;  Location: [facility];  Service: Gynecology;  Laterality: N/A;  Removal     OB History    Gravida  2   Para  2   Term  1   Preterm  1   AB  0   Living  1     SAB  0   TAB  0   Ectopic  0   Multiple  0   Live Births  2            Home Medications    Prior to Admission medications   Medication Sig Start Date End Date Taking? Authorizing Provider  albuterol (PROVENTIL HFA;VENTOLIN HFA) 108 (90 Base) MCG/ACT inhaler Inhale 2 puffs into the lungs every 4 (four) hours as needed for wheezing or shortness of breath. 02/11/18   McDonald, Mia A, PA-C  albuterol (PROVENTIL HFA;VENTOLIN HFA) 108 (90 Base) MCG/ACT inhaler Inhale 1-2 puffs into the lungs every 4 (four) hours as needed for wheezing or shortness of breath (or cough). 04/01/18   Street, [place], PA-C  azithromycin (ZITHROMAX Z-PAK) 250 MG tablet 2 po day one, then 1 daily x 4 days 04/01/18   Street, [place], PA-C  benzonatate (TESSALON) 200 MG capsule Take 1 capsule (200 mg total) by mouth 3 (three) times daily as needed for cough. 04/01/18  Street, [place], PA-C  famotidine (PEPCID) 20 MG tablet Take 1 tablet (20 mg total) by mouth 2 (two) times daily. 04/01/18   Street, Mercedes, PA-C  guaifenesin (HUMIBID E) 400 MG TABS tablet Take 1 tablet (400 mg total) by mouth every 4 (four) hours. 02/11/18   McDonald, Mia A, PA-C  HYDROcodone-homatropine (HYCODAN) 5-1.5 MG/5ML syrup Take 5 mLs by mouth every 8 (eight) hours as needed for cough. 07/06/18   Antonietta Breach, PA-C  promethazine-dextromethorphan (PROMETHAZINE-DM) 6.25-15 MG/5ML syrup Take 5 mLs by mouth 4 (four) times daily as needed for cough. 02/11/18   McDonald, Laymond Purser, PA-C    Family History Family History  Problem Relation Age of Onset  . Cancer Mother        lung  . Hypertension Mother   .  Diabetes Father   . Heart failure Father   . Diabetes Paternal Grandmother   . Hypertension Sister   . Hypertension Brother   . Rashes / Skin problems Sister   . Other Neg Hx     Social History Social History   Tobacco Use  . Smoking status: Former Smoker    Types: Cigarettes    Last attempt to quit: 02/08/2009    Years since quitting: 9.4  . Smokeless tobacco: Never Used  Substance Use Topics  . Alcohol use: No  . Drug use: No     Allergies   Patient has no known allergies.   Review of Systems Review of Systems  Respiratory: Positive for cough.   Cardiovascular: Positive for chest pain.  Musculoskeletal: Positive for back pain.  Ten systems reviewed and are negative for acute change, except as noted in the HPI.    Physical Exam Updated Vital Signs BP 127/84   Pulse 98   Temp 98.3 F (36.8 C) (Oral)   Resp 17   LMP 07/01/2018   SpO2 100%   Physical Exam Vitals signs and nursing note reviewed.  Constitutional:      General: She is not in acute distress.    Appearance: She is well-developed. She is not diaphoretic.     Comments: Nontoxic appearing and in NAD. Morbidly obese.  HENT:     Head: Normocephalic and atraumatic.     Nose: Congestion present.  Eyes:     General: No scleral icterus.    Conjunctiva/sclera: Conjunctivae normal.  Neck:     Musculoskeletal: Normal range of motion.  Cardiovascular:     Rate and Rhythm: Normal rate and regular rhythm.     Pulses: Normal pulses.     Comments: Respirations even and unlabored Pulmonary:     Effort: Pulmonary effort is normal. No respiratory distress.     Comments: Intermittent dry, nonproductive cough. Faint expiratory wheeze, scattered. Respirations even and unlabored. Musculoskeletal: Normal range of motion.  Skin:    General: Skin is warm and dry.     Coloration: Skin is not pale.     Findings: No erythema or rash.  Neurological:     Mental Status: She is alert and oriented to person, place, and  time.  Psychiatric:        Behavior: Behavior normal.      ED Treatments / Results  Labs (all labs ordered are listed, but only abnormal results are displayed) Labs Reviewed  BASIC METABOLIC PANEL - Abnormal; Notable for the following components:      Result Value   Glucose, Bld 120 (*)    All other components within normal limits  CBC - Abnormal; Notable  for the following components:   RDW 15.7 (*)    All other components within normal limits  POCT I-STAT TROPONIN I  I-STAT BETA HCG BLOOD, ED (NOT ORDERABLE)    EKG EKG Interpretation  Date/Time:  Wednesday July 05 2018 18:26:11 EDT Ventricular Rate:  97 PR Interval:    QRS Duration: 84 QT Interval:  351 QTC Calculation: 446 R Axis:   73 Text Interpretation:  Sinus rhythm Baseline wander in lead(s) V2 Baseline wander Otherwise no significant change Confirmed by Duffy Bruce [phone]) on 07/05/2018 11:55:40 PM   Radiology Dg Chest 2 View  Result Date: 07/05/2018 CLINICAL DATA:  Chest pain EXAM: CHEST - 2 VIEW COMPARISON:  04/01/2018 FINDINGS: Midline trachea. Normal heart size and mediastinal contours. No pleural effusion or pneumothorax. Chronic pulmonary interstitial thickening. Somewhat more focal right greater than left upper lobe pulmonary opacities are similar over prior exams, including 04/01/2018. IMPRESSION: No significant change since 04/01/2018. Chronic interstitial thickening which may relate to chronic bronchitis/smoking. Right greater than left upper lobe opacities which are similar, favoring scarring. Electronically Signed   By: Abigail Miyamoto M.D.   On: 07/05/2018 19:11    Procedures Procedures (including critical care time)  Medications Ordered in ED Medications  fluticasone (FLONASE) 50 MCG/ACT nasal spray 2 spray (has no administration in time range)  albuterol (PROVENTIL) (2.5 MG/3ML) 0.083% nebulizer solution 5 mg (5 mg Nebulization Given 07/06/18 0018)  ipratropium (ATROVENT) nebulizer solution 0.5  mg (0.5 mg Nebulization Given 07/06/18 0018)  alum & mag hydroxide-simeth (MAALOX/MYLANTA) 200-200-20 MG/5ML suspension 30 mL (30 mLs Oral Given 07/06/18 0017)    And  lidocaine (XYLOCAINE) 2 % viscous mouth solution 15 mL (15 mLs Oral Given 07/06/18 0017)    1:09 AM Lungs grossly clear on repeat assessment. No tachypnea, dyspnea, hypoxia. Plan for continued outpatient management. Patient with some increased nasal congestion. Will order Flonase prior to discharge.   Initial Impression / Assessment and Plan / ED Course  I have reviewed the triage vital signs and the nursing notes.  Pertinent labs & imaging results that were available during my care of the patient were reviewed by me and considered in my medical decision making (see chart for details).        Pt CXR negative for acute infiltrate. Patient's symptoms are consistent with URI, likely viral etiology. Discussed that antibiotics are not indicated for viral infections. Patient will be discharged with symptomatic treatment.  She verbalizes understanding and is agreeable with plan. Patient is hemodynamically stable and in NAD prior to discharge.   Final Clinical Impressions(s) / ED Diagnoses   Final diagnoses:  URI with cough and congestion    ED Discharge Orders         Ordered    HYDROcodone-homatropine (HYCODAN) 5-1.5 MG/5ML syrup  Every 8 hours PRN     07/06/18 0108           Antonietta Breach, PA-C 07/06/18 Eloisa Northern, MD 07/07/18 [phone]

## 2018-07-05 NOTE — ED Notes (Signed)
ED Provider at bedside. 

## 2018-07-06 MED ORDER — FLUTICASONE PROPIONATE 50 MCG/ACT NA SUSP
2.0000 | Freq: Every day | NASAL | Status: DC
  Administered 2018-07-06: 2 via NASAL
  Filled 2018-07-06: qty 16

## 2018-07-06 MED ORDER — HYDROCODONE-HOMATROPINE 5-1.5 MG/5ML PO SYRP: 5.0000 mL | ORAL_SOLUTION | Freq: Three times a day (TID) | ORAL | 0 refills | Status: DC | PRN

## 2018-07-06 NOTE — Discharge Instructions (Signed)
Use 2 sprays of Flonase in each nostril daily for management of congestion.  You have been given a prescription for Hycodan cough syrup.  This medication may make you drowsy and impair your judgment.  Do not drive or drink alcohol after taking this medication.  You may continue use of other over-the-counter medication such as Mucinex, Robitussin, DayQuil or NyQuil.  Use 2 puffs of an albuterol inhaler every 4-6 hours as needed for persistent cough or shortness of breath.  Follow-up with your primary care doctor to ensure resolution of symptoms.

## 2018-07-09 ENCOUNTER — Emergency Department (HOSPITAL_COMMUNITY)
Admission: EM | Admit: 2018-07-09 | Discharge: 2018-07-09 | Disposition: A | Discharge status: Home / Self Care | Payer: Medicaid Other | Attending: Emergency Medicine | Admitting: Emergency Medicine

## 2018-07-09 ENCOUNTER — Encounter (HOSPITAL_COMMUNITY): Payer: Self-pay | Admitting: Emergency Medicine

## 2018-07-09 ENCOUNTER — Other Ambulatory Visit: Payer: Self-pay

## 2018-07-09 DIAGNOSIS — Z87891 Personal history of nicotine dependence: Secondary | ICD-10-CM | POA: Diagnosis not present

## 2018-07-09 DIAGNOSIS — K146 Glossodynia: Secondary | ICD-10-CM | POA: Insufficient documentation

## 2018-07-09 DIAGNOSIS — J029 Acute pharyngitis, unspecified: Secondary | ICD-10-CM

## 2018-07-09 DIAGNOSIS — I10 Essential (primary) hypertension: Secondary | ICD-10-CM | POA: Insufficient documentation

## 2018-07-09 MED ORDER — NYSTATIN 100000 UNIT/ML MT SUSP: 500000.0000 [IU] | Freq: Four times a day (QID) | OROMUCOSAL | 0 refills | Status: DC

## 2018-07-09 NOTE — ED Provider Notes (Signed)
[place] DEPT Provider Note   CSN: [id number] Arrival date & time: 07/09/18  0114    History   Chief Complaint Chief Complaint  Patient presents with  . Sore Throat    HPI Diana Blair is a 38 y.o. female.     The history is provided by the patient.  Sore Throat  This is a new problem. The current episode started more than 2 days ago. The problem occurs daily. The problem has been gradually worsening. The symptoms are aggravated by swallowing. Nothing relieves the symptoms.  Reports sore throat and tongue pain for a week.  She reports she noticed a white film on her tongue and when she scratched off it bled.  No fevers or vomiting.  She was recently treated for an upper respiratory infection.   Past Medical History:  Diagnosis Date  . 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 Diverticulitis   . Heart palpitations   . Morbid obesity with BMI of 50.0-59.9, adult (HCC)    BMI 58.8  . PCOS (polycystic ovarian syndrome)   . Seizure [facility])    as a child    Patient Active Problem List   Diagnosis Date Noted  . Advanced maternal age in multigravida 11/10/2016  . Chronic hypertension in pregnancy 11/10/2016  . IUGR (intrauterine growth restriction) affecting care of mother, third trimester, other fetus 11/05/2016  . BMI 60.0-69.9, adult ([place]) 05/04/2016  . History of preterm delivery 08/20/2015  . History of cervical cerclage 08/20/2015  . PCOS (polycystic ovarian syndrome) 02/08/2013    Past Surgical History:  Procedure Laterality Date  . CERVICAL CERCLAGE  03/13/2012   Procedure: CERCLAGE CERVICAL;  Surgeon: Jonnie Kind, MD;  Location: [facility];  Service: Gynecology;  Laterality: N/A;  . CERVICAL CERCLAGE  03/17/2012   Procedure: CERCLAGE CERVICAL;  Surgeon: Osborne Oman, MD;  Location: [facility];  Service: Gynecology;  Laterality: N/A;  Removal     OB History    Gravida  2   Para  2   Term  1    Preterm  1   AB  0   Living  1     SAB  0   TAB  0   Ectopic  0   Multiple  0   Live Births  2            Home Medications    Prior to Admission medications   Medication Sig Start Date End Date Taking? Authorizing Provider  albuterol (PROVENTIL HFA;VENTOLIN HFA) 108 (90 Base) MCG/ACT inhaler Inhale 1-2 puffs into the lungs every 4 (four) hours as needed for wheezing or shortness of breath (or cough). 04/01/18  Yes Street, [place], PA-C  HYDROcodone-homatropine (HYCODAN) 5-1.5 MG/5ML syrup Take 5 mLs by mouth every 8 (eight) hours as needed for cough. 07/06/18  Yes Antonietta Breach, PA-C  albuterol (PROVENTIL HFA;VENTOLIN HFA) 108 (90 Base) MCG/ACT inhaler Inhale 2 puffs into the lungs every 4 (four) hours as needed for wheezing or shortness of breath. Patient not taking: Reported on 07/09/2018 02/11/18   McDonald, [place] A, PA-C  nystatin (MYCOSTATIN) 100000 UNIT/ML suspension Take 5 mLs (500,000 Units total) by mouth 4 (four) times daily. Swish and spit 07/09/18   Ripley Fraise, MD    Family History Family History  Problem Relation Age of Onset  . Cancer Mother        lung  . Hypertension Mother   . Diabetes Father   . Heart  failure Father   . Diabetes Paternal Grandmother   . Hypertension Sister   . Hypertension Brother   . Rashes / Skin problems Sister   . Other Neg Hx     Social History Social History   Tobacco Use  . Smoking status: Former Smoker    Types: Cigarettes    Last attempt to quit: 02/08/2009    Years since quitting: 9.4  . Smokeless tobacco: Never Used  Substance Use Topics  . Alcohol use: No  . Drug use: No     Allergies   Patient has no known allergies.   Review of Systems Review of Systems  Constitutional: Negative for fever.  HENT: Negative for trouble swallowing.   Gastrointestinal: Negative for vomiting.     Physical Exam Updated Vital Signs BP 121/86 (BP Location: Left Arm)   Pulse 80   Temp 98.3 F (36.8 C) (Oral)    Resp 18   Wt (!) 163.3 kg   LMP 07/01/2018   SpO2 100%   BMI 63.77 kg/m   Physical Exam CONSTITUTIONAL: Well developed/well nourished HEAD: Normocephalic/atraumatic EYES: EOMI/PERRL ENMT: Mucous membranes moist, uvula midline, mild erythema, no exudates.  No stridor, no drooling.  No obvious evidence of thrush.  There is erythema noted to her tongue without any edema.  No angioedema. NECK: supple no meningeal signs SPINE/BACK:entire spine nontender CV: S1/S2 noted, no murmurs/rubs/gallops noted LUNGS: Lungs are clear to auscultation bilaterally, no apparent distress ABDOMEN: soft, nontender, obese GU:no cva tenderness NEURO: Pt is awake/alert/appropriate, moves all extremitiesx4.  No facial droop.   EXTREMITIES: pulses normal/equal, full ROM SKIN: warm, color normal PSYCH: no abnormalities of mood noted, alert and oriented to situation   ED Treatments / Results  Labs (all labs ordered are listed, but only abnormal results are displayed) Labs Reviewed - No data to display  EKG None  Radiology No results found.  Procedures Procedures   Medications Ordered in ED Medications - No data to display   Initial Impression / Assessment and Plan / ED Course  I have reviewed the triage vital signs and the nursing notes.      Patient well-appearing.  No acute distress.   Her main complaint tonight was tongue pain.  I do not see any obvious evidence of thrush at this time.  She does report recently having to scrape off a white film from her tongue.  After discussion, we will start her on nystatin just in case this was thrush.  She is not diabetic, she does not take oral/inhaled steroids.  Unlikely to be HIV given her history, she had negative HIV 2 years ago and denies any recent sexual activity.  She will follow-up with her PCP, will also give her follow-up with GI. I do not see obvious evidence of oral cancer at this time.  Final Clinical Impressions(s) / ED Diagnoses    Final diagnoses:  Sore throat  Tongue pain    ED Discharge Orders         Ordered    nystatin (MYCOSTATIN) 100000 UNIT/ML suspension  4 times daily     07/09/18 0354           Ripley Fraise, MD 07/09/18 0407

## 2018-07-09 NOTE — ED Triage Notes (Signed)
Pt reports tongue and throat pain onset x 1 week slowly worsening also reports white film on tongue

## 2018-08-24 ENCOUNTER — Encounter: Payer: Self-pay | Admitting: *Deleted

## 2019-01-04 ENCOUNTER — Other Ambulatory Visit: Payer: Self-pay

## 2019-01-04 DIAGNOSIS — Z20822 Contact with and (suspected) exposure to covid-19: Secondary | ICD-10-CM

## 2019-01-05 LAB — NOVEL CORONAVIRUS, NAA: SARS-CoV-2, NAA: NOT DETECTED

## 2019-01-08 ENCOUNTER — Emergency Department (HOSPITAL_COMMUNITY)
Admission: EM | Admit: 2019-01-08 | Discharge: 2019-01-09 | Disposition: A | Discharge status: Home / Self Care | Payer: Medicaid Other | Attending: Emergency Medicine | Admitting: Emergency Medicine

## 2019-01-08 ENCOUNTER — Emergency Department (HOSPITAL_COMMUNITY): Payer: Medicaid Other

## 2019-01-08 ENCOUNTER — Other Ambulatory Visit: Payer: Self-pay

## 2019-01-08 ENCOUNTER — Encounter (HOSPITAL_COMMUNITY): Payer: Self-pay

## 2019-01-08 DIAGNOSIS — Z20828 Contact with and (suspected) exposure to other viral communicable diseases: Secondary | ICD-10-CM | POA: Insufficient documentation

## 2019-01-08 DIAGNOSIS — R079 Chest pain, unspecified: Secondary | ICD-10-CM | POA: Diagnosis present

## 2019-01-08 DIAGNOSIS — J189 Pneumonia, unspecified organism: Secondary | ICD-10-CM | POA: Diagnosis not present

## 2019-01-08 DIAGNOSIS — Z87891 Personal history of nicotine dependence: Secondary | ICD-10-CM | POA: Insufficient documentation

## 2019-01-08 NOTE — ED Triage Notes (Signed)
Pt reports chest pain and palpitations. She reports that she feels like she may have fluid on her lungs and that it feels like the last time she has bronchitis. She states that she has been seen by a cardiologist who said that he thinks it is stress. Reports intermittent SOB, more so during exertion. No fever or cough. Speaking in clear, complete sentences.

## 2019-01-08 NOTE — ED Notes (Signed)
Attempted blood draw x2 unsuccessful 

## 2019-01-09 LAB — SARS CORONAVIRUS 2 (TAT 6-24 HRS): SARS Coronavirus 2: NEGATIVE

## 2019-01-09 MED ORDER — LEVOFLOXACIN 750 MG PO TABS: 750.0000 mg | ORAL_TABLET | Freq: Every day | ORAL | 0 refills | Status: DC

## 2019-01-09 MED ORDER — LEVOFLOXACIN 750 MG PO TABS
750.0000 mg | ORAL_TABLET | Freq: Once | ORAL | Status: AC
  Administered 2019-01-09: 750 mg via ORAL
  Filled 2019-01-09: qty 1

## 2019-01-09 NOTE — ED Provider Notes (Signed)
[facility] DEPT Provider Note   CSN: [id number] Arrival date & time: 01/08/19  1831     History   Chief Complaint Chief Complaint  Patient presents with  . Chest Pain    HPI Diana Blair is a 38 y.o. female.      Chest Pain Pain location:  Substernal area, L chest, R chest, L lateral chest and R lateral chest Pain quality: aching   Pain quality: not sharp   Pain radiates to:  Does not radiate Pain severity:  Mild Onset quality:  Gradual Timing:  Constant Progression:  Worsening Chronicity:  New Worsened by:  Nothing   Past Medical History:  Diagnosis Date  . 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 Diverticulitis   . Heart palpitations   . Morbid obesity with BMI of 50.0-59.9, adult (HCC)    BMI 58.8  . PCOS (polycystic ovarian syndrome)   . Seizure [facility])    as a child    Patient Active Problem List   Diagnosis Date Noted  . Advanced maternal age in multigravida 11/10/2016  . Chronic hypertension in pregnancy 11/10/2016  . IUGR (intrauterine growth restriction) affecting care of mother, third trimester, other fetus 11/05/2016  . BMI 60.0-69.9, adult ([place]) 05/04/2016  . History of preterm delivery 08/20/2015  . History of cervical cerclage 08/20/2015  . PCOS (polycystic ovarian syndrome) 02/08/2013    Past Surgical History:  Procedure Laterality Date  . CERVICAL CERCLAGE  03/13/2012   Procedure: CERCLAGE CERVICAL;  Surgeon: Jonnie Kind, MD;  Location: [facility];  Service: Gynecology;  Laterality: N/A;  . CERVICAL CERCLAGE  03/17/2012   Procedure: CERCLAGE CERVICAL;  Surgeon: Osborne Oman, MD;  Location: [facility];  Service: Gynecology;  Laterality: N/A;  Removal     OB History    Gravida  2   Para  2   Term  1   Preterm  1   AB  0   Living  1     SAB  0   TAB  0   Ectopic  0   Multiple  0   Live Births  2            Home Medications    Prior to Admission  medications   Medication Sig Start Date End Date Taking? Authorizing Provider  levofloxacin (LEVAQUIN) 750 MG tablet Take 1 tablet (750 mg total) by mouth daily. X 7 days 01/09/19   Nayellie Sanseverino, Corene Cornea, MD  albuterol (PROVENTIL HFA;VENTOLIN HFA) 108 (90 Base) MCG/ACT inhaler Inhale 1-2 puffs into the lungs every 4 (four) hours as needed for wheezing or shortness of breath (or cough). Patient not taking: Reported on 01/09/2019 04/01/18 01/09/19  Street, [place], PA-C    Family History Family History  Problem Relation Age of Onset  . Cancer Mother        lung  . Hypertension Mother   . Diabetes Father   . Heart failure Father   . Diabetes Paternal Grandmother   . Hypertension Sister   . Hypertension Brother   . Rashes / Skin problems Sister   . Other Neg Hx     Social History Social History   Tobacco Use  . Smoking status: Former Smoker    Types: Cigarettes    Quit date: 02/08/2009    Years since quitting: 9.9  . Smokeless tobacco: Never Used  Substance Use Topics  . Alcohol use: No  . Drug use: No  Allergies   Patient has no known allergies.   Review of Systems Review of Systems  Cardiovascular: Positive for chest pain.  All other systems reviewed and are negative.    Physical Exam Updated Vital Signs BP (!) 152/87 (BP Location: Left Wrist)   Pulse 78   Temp 98.8 F (37.1 C) (Oral)   Resp 16   SpO2 100%   Physical Exam Vitals signs and nursing note reviewed.  Constitutional:      Appearance: She is well-developed.  HENT:     Head: Normocephalic and atraumatic.     Mouth/Throat:     Mouth: Mucous membranes are dry.     Pharynx: Oropharynx is clear.  Eyes:     Conjunctiva/sclera: Conjunctivae normal.  Neck:     Musculoskeletal: Normal range of motion.  Cardiovascular:     Rate and Rhythm: Normal rate and regular rhythm.  Pulmonary:     Effort: No respiratory distress.     Breath sounds: No stridor.  Abdominal:     General: Bowel sounds are normal.  There is no distension.  Skin:    General: Skin is warm and dry.  Neurological:     Mental Status: She is alert.      ED Treatments / Results  Labs (all labs ordered are listed, but only abnormal results are displayed) Labs Reviewed  SARS CORONAVIRUS 2 (TAT 6-24 HRS)  TROPONIN I (HIGH SENSITIVITY)  TROPONIN I (HIGH SENSITIVITY)    EKG EKG Interpretation  Date/Time:  Monday January 08 2019 19:45:02 EDT Ventricular Rate:  81 PR Interval:    QRS Duration: 88 QT Interval:  362 QTC Calculation: 421 R Axis:   62 Text Interpretation:  Sinus rhythm Baseline wander in lead(s) V3 No significant change since last tracing Confirmed by Dorie Rank [phone] on 01/08/2019 7:48:55 PM   Radiology Dg Chest 2 View  Result Date: 01/08/2019 CLINICAL DATA:  Chest pain EXAM: CHEST - 2 VIEW COMPARISON:  Radiograph 07/05/2018, CT 09/07/2013 FINDINGS: Multifocal areas of airspace opacity are present in the lungs. Continued stability of the more bandlike opacities in the right and left upper lung (see annotated image). No pneumothorax or effusion. No acute osseous or soft tissue abnormality. Cardiomediastinal contours are stable from prior IMPRESSION: Multifocal areas of airspace opacity in the lungs, concerning for multifocal pneumonia. Bandlike areas of opacity in the upper lungs, stable from prior images are most compatible with scarring. Electronically Signed   By: Lovena Le M.D.   On: 01/08/2019 20:29    Procedures Procedures (including critical care time)  Medications Ordered in ED Medications  levofloxacin (LEVAQUIN) tablet 750 mg (750 mg Oral Given 01/09/19 0044)     Initial Impression / Assessment and Plan / ED Course  I have reviewed the triage vital signs and the nursing notes.  Pertinent labs & imaging results that were available during my care of the patient were reviewed by me and considered in my medical decision making (see chart for details).   hre with likely community  acquired pneumonia. Will start antibiotics while waiting covid. Many issues with bronchitis and pneumonia over the last few years, requests pulm referral. No resp distress, hypoxia or other concern/indication for admission.   Final Clinical Impressions(s) / ED Diagnoses   Final diagnoses:  Community acquired pneumonia, unspecified laterality    ED Discharge Orders         Ordered    levofloxacin (LEVAQUIN) 750 MG tablet  Daily     01/09/19 0031  Ambulatory referral to Pulmonology     01/09/19 0031           Simra Fiebig, Corene Cornea, MD 01/09/19 [phone]

## 2019-01-23 ENCOUNTER — Ambulatory Visit: Payer: Self-pay | Admitting: Adult Health Nurse Practitioner

## 2019-02-19 ENCOUNTER — Encounter (HOSPITAL_COMMUNITY): Payer: Self-pay

## 2019-02-19 ENCOUNTER — Ambulatory Visit (INDEPENDENT_AMBULATORY_CARE_PROVIDER_SITE_OTHER): Payer: Medicaid Other

## 2019-02-19 ENCOUNTER — Ambulatory Visit (HOSPITAL_COMMUNITY)
Admission: EM | Admit: 2019-02-19 | Discharge: 2019-02-19 | Disposition: A | Discharge status: Home / Self Care | Payer: Medicaid Other | Attending: Family Medicine | Admitting: Family Medicine

## 2019-02-19 ENCOUNTER — Other Ambulatory Visit: Payer: Self-pay

## 2019-02-19 DIAGNOSIS — R0602 Shortness of breath: Secondary | ICD-10-CM | POA: Diagnosis not present

## 2019-02-19 MED ORDER — AMOXICILLIN-POT CLAVULANATE 875-125 MG PO TABS: 1.0000 | ORAL_TABLET | Freq: Two times a day (BID) | ORAL | 0 refills | Status: DC

## 2019-02-19 MED ORDER — AZITHROMYCIN 250 MG PO TABS: 250.0000 mg | ORAL_TABLET | Freq: Every day | ORAL | 0 refills | Status: DC

## 2019-02-19 NOTE — ED Triage Notes (Signed)
Pt states she has been told she has pneumonia x 1 month now.

## 2019-02-19 NOTE — ED Provider Notes (Addendum)
[place]    CSN: [id number] Arrival date & time: 02/19/19  0900      History   Chief Complaint Chief Complaint  Patient presents with  . Shortness of Breath    HPI Diana Blair is a 38 y.o. female.   Patient is a 38 year old female with past medical history of hypertension, diverticulitis, morbid obesity, PCOS.  She presents today with continued cough, shortness of breath.  Symptoms have been going on for approximately 1 month.  She was seen previously in the ER and diagnosed with pneumonia.  Started on Levaquin which she has finished.  Feels like her symptoms have not really improved.  The shortness of breath is worse when walking.  Some left-sided chest discomfort.  No calf pain, tenderness or swelling.  No fever, chills, body aches or night sweats. Former smoker.   ROS per HPI      Past Medical History:  Diagnosis Date  . 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 Diverticulitis   . Heart palpitations   . Morbid obesity with BMI of 50.0-59.9, adult (HCC)    BMI 58.8  . PCOS (polycystic ovarian syndrome)   . Seizure [facility])    as a child    Patient Active Problem List   Diagnosis Date Noted  . Advanced maternal age in multigravida 11/10/2016  . Chronic hypertension in pregnancy 11/10/2016  . IUGR (intrauterine growth restriction) affecting care of mother, third trimester, other fetus 11/05/2016  . BMI 60.0-69.9, adult ([place]) 05/04/2016  . History of preterm delivery 08/20/2015  . History of cervical cerclage 08/20/2015  . PCOS (polycystic ovarian syndrome) 02/08/2013    Past Surgical History:  Procedure Laterality Date  . CERVICAL CERCLAGE  03/13/2012   Procedure: CERCLAGE CERVICAL;  Surgeon: Jonnie Kind, MD;  Location: [facility];  Service: Gynecology;  Laterality: N/A;  . CERVICAL CERCLAGE  03/17/2012   Procedure: CERCLAGE CERVICAL;  Surgeon: Osborne Oman, MD;  Location: [facility];  Service: Gynecology;   Laterality: N/A;  Removal    OB History    Gravida  2   Para  2   Term  1   Preterm  1   AB  0   Living  1     SAB  0   TAB  0   Ectopic  0   Multiple  0   Live Births  2            Home Medications    Prior to Admission medications   Medication Sig Start Date End Date Taking? Authorizing Provider  amoxicillin-clavulanate (AUGMENTIN) 875-125 MG tablet Take 1 tablet by mouth every 12 (twelve) hours. 02/19/19   Loura Halt A, NP  azithromycin (ZITHROMAX) 250 MG tablet Take 1 tablet (250 mg total) by mouth daily. Take first 2 tablets together, then 1 every day until finished. 02/19/19   Loura Halt A, NP  levofloxacin (LEVAQUIN) 750 MG tablet Take 1 tablet (750 mg total) by mouth daily. X 7 days 01/09/19   Mesner, Corene Cornea, MD  albuterol (PROVENTIL HFA;VENTOLIN HFA) 108 (90 Base) MCG/ACT inhaler Inhale 1-2 puffs into the lungs every 4 (four) hours as needed for wheezing or shortness of breath (or cough). Patient not taking: Reported on 01/09/2019 04/01/18 01/09/19  Street, [place], PA-C    Family History Family History  Problem Relation Age of Onset  . Cancer Mother        lung  . Hypertension Mother   . Diabetes  Father   . Heart failure Father   . Diabetes Paternal Grandmother   . Hypertension Sister   . Hypertension Brother   . Rashes / Skin problems Sister   . Other Neg Hx     Social History Social History   Tobacco Use  . Smoking status: Former Smoker    Types: Cigarettes    Quit date: 02/08/2009    Years since quitting: 10.0  . Smokeless tobacco: Never Used  Substance Use Topics  . Alcohol use: No  . Drug use: No     Allergies   Patient has no known allergies.   Review of Systems Review of Systems  Constitutional: Positive for activity change. Negative for appetite change, chills, diaphoresis, fatigue, fever and unexpected weight change.  HENT: Negative for congestion.   Respiratory: Positive for cough, chest tightness and shortness of  breath.   Cardiovascular: Negative for chest pain, palpitations and leg swelling.  Gastrointestinal: Negative for abdominal distention, abdominal pain and nausea.  Skin: Negative for color change.     Physical Exam Triage Vital Signs ED Triage Vitals  Enc Vitals Group     BP 02/19/19 0906 (!) 155/97     Pulse Rate 02/19/19 0906 93     Resp 02/19/19 0906 18     Temp 02/19/19 0906 98.9 F (37.2 C)     Temp Source 02/19/19 0906 Oral     SpO2 02/19/19 0906 100 %     Weight 02/19/19 0907 (!) 495 lb (224.5 kg)     Height --      Head Circumference --      Peak Flow --      Pain Score 02/19/19 0907 0     Pain Loc --      Pain Edu? --      Excl. in [place]? --    No data found.  Updated Vital Signs BP (!) 155/97 (BP Location: Right Arm)   Pulse 93   Temp 98.9 F (37.2 C) (Oral)   Resp 18   Wt (!) 495 lb (224.5 kg)   SpO2 100%   BMI 87.69 kg/m   Visual Acuity Right Eye Distance:   Left Eye Distance:   Bilateral Distance:    Right Eye Near:   Left Eye Near:    Bilateral Near:     Physical Exam Constitutional:      General: She is not in acute distress.    Appearance: She is obese. She is not ill-appearing, toxic-appearing or diaphoretic.  HENT:     Head: Normocephalic and atraumatic.  Neck:     Musculoskeletal: Normal range of motion.  Cardiovascular:     Rate and Rhythm: Normal rate and regular rhythm.  Pulmonary:     Effort: Pulmonary effort is normal.     Breath sounds: Decreased breath sounds present.     Comments: Mild inspiratory wheezing.  Chest:     Chest wall: No tenderness or crepitus.  Musculoskeletal: Normal range of motion.     Right lower leg: She exhibits no tenderness. No edema.     Left lower leg: She exhibits no tenderness. No edema.  Skin:    General: Skin is warm and dry.  Neurological:     Mental Status: She is alert.  Psychiatric:        Mood and Affect: Mood normal.      UC Treatments / Results  Labs (all labs ordered are listed,  but only abnormal results are displayed) Labs Reviewed -  No data to display  EKG   Radiology Dg Chest 2 View  Result Date: 02/19/2019 CLINICAL DATA:  Cough and shortness of breath. Recent pneumonia. EXAM: CHEST - 2 VIEW COMPARISON:  01/08/2019 02/11/2018. FINDINGS: There are persistent patchy ill-defined opacities in both upper lungs, not substantially changed in the 6 week interval since the prior study. This is superimposed on areas of more chronic irregular bilateral upper lung opacities, slightly progressive comparing back to 02/11/2018. No evidence for pleural effusion or pulmonary edema. The cardiopericardial silhouette is within normal limits for size. The visualized bony structures of the thorax are intact. IMPRESSION: No substantial change in patchy bilateral upper lobe airspace opacities since the prior study with some progression comparing back to 02/11/2018. Given the persistence of these upper lung abnormalities in this young patient with persistent respiratory symptoms, CT chest without contrast recommended to more thoroughly evaluate. Electronically Signed   By: Misty Stanley M.D.   On: 02/19/2019 09:58    Procedures Procedures (including critical care time)  Medications Ordered in UC Medications - No data to display  Initial Impression / Assessment and Plan / UC Course  I have reviewed the triage vital signs and the nursing notes.  Pertinent labs & imaging results that were available during my care of the patient were reviewed by me and considered in my medical decision making (see chart for details).     38 year old female with continued cough, shortness of breath and chest discomfort. Treated for community-acquired pneumonia on 01/08/19. Treated with Levaquin. No change with the medication Chest x-ray today  No substantial change in patchy bilateral upper lobe airspace opacities since the prior study with some progression comparing back to 02/11/2018. Given the  persistence of these upper lung abnormalities in this young patient with persistent respiratory symptoms, CT chest without contrast recommended to more thoroughly Evaluate. We will try dual therapy to see if this helps with her symptoms. If not recommended that she get the CT of the chest.  Patient is going to call the pulmonologist today to try to get an appointment. Strict ER precautions that if symptoms do not improve or worsen she will need to go to the ER for CT scan of the chest. Patient understanding and agree.   Final Clinical Impressions(s) / UC Diagnoses   Final diagnoses:  SOB (shortness of breath)     Discharge Instructions     You chest x ray did not show any improvement and possibly worse. The radiologist recommended a CT of the chest.  You need to call the pulmonologist today.  We will try dual therapy to see if this helps.  Take the antibiotic as prescribed.  You may want to take a probiotic to help with the gut and prevent diarrhea.  If you become more short of breath or worsening symptoms you need to go to the ER for CT of the chest. Otherwise follow-up with a specialist as planned     ED Prescriptions    Medication Sig Dispense Auth. Provider   amoxicillin-clavulanate (AUGMENTIN) 875-125 MG tablet Take 1 tablet by mouth every 12 (twelve) hours. 14 tablet Jhan Conery A, NP   azithromycin (ZITHROMAX) 250 MG tablet Take 1 tablet (250 mg total) by mouth daily. Take first 2 tablets together, then 1 every day until finished. 6 tablet Loura Halt A, NP     PDMP not reviewed this encounter.   Orvan July, NP 02/19/19 1042    Loura Halt A, NP 02/19/19 1044

## 2019-02-19 NOTE — Discharge Instructions (Addendum)
You chest x ray did not show any improvement and possibly worse. The radiologist recommended a CT of the chest.  You need to call the pulmonologist today.  We will try dual therapy to see if this helps.  Take the antibiotic as prescribed.  You may want to take a probiotic to help with the gut and prevent diarrhea.  If you become more short of breath or worsening symptoms you need to go to the ER for CT of the chest. Otherwise follow-up with a specialist as planned

## 2019-02-21 ENCOUNTER — Emergency Department (HOSPITAL_COMMUNITY)
Admission: EM | Admit: 2019-02-21 | Discharge: 2019-02-21 | Disposition: A | Discharge status: Home / Self Care | Payer: Medicaid Other | Attending: Emergency Medicine | Admitting: Emergency Medicine

## 2019-02-21 ENCOUNTER — Emergency Department (HOSPITAL_COMMUNITY): Payer: Medicaid Other

## 2019-02-21 ENCOUNTER — Other Ambulatory Visit: Payer: Self-pay

## 2019-02-21 DIAGNOSIS — Z20828 Contact with and (suspected) exposure to other viral communicable diseases: Secondary | ICD-10-CM | POA: Insufficient documentation

## 2019-02-21 DIAGNOSIS — R05 Cough: Secondary | ICD-10-CM | POA: Insufficient documentation

## 2019-02-21 DIAGNOSIS — I10 Essential (primary) hypertension: Secondary | ICD-10-CM | POA: Diagnosis not present

## 2019-02-21 DIAGNOSIS — Z9189 Other specified personal risk factors, not elsewhere classified: Secondary | ICD-10-CM

## 2019-02-21 DIAGNOSIS — J188 Other pneumonia, unspecified organism: Secondary | ICD-10-CM | POA: Insufficient documentation

## 2019-02-21 DIAGNOSIS — Z87891 Personal history of nicotine dependence: Secondary | ICD-10-CM | POA: Insufficient documentation

## 2019-02-21 DIAGNOSIS — R0602 Shortness of breath: Secondary | ICD-10-CM | POA: Diagnosis present

## 2019-02-21 DIAGNOSIS — J189 Pneumonia, unspecified organism: Secondary | ICD-10-CM

## 2019-02-21 LAB — CBC
HCT: 41 % (ref 36.0–46.0)
Hemoglobin: 12.6 g/dL (ref 12.0–15.0)
MCH: 25.7 pg — ABNORMAL LOW (ref 26.0–34.0)
MCHC: 30.7 g/dL (ref 30.0–36.0)
MCV: 83.7 fL (ref 80.0–100.0)
Platelets: 284 10*3/uL (ref 150–400)
RBC: 4.9 MIL/uL (ref 3.87–5.11)
RDW: 15.8 % — ABNORMAL HIGH (ref 11.5–15.5)
WBC: 5.4 10*3/uL (ref 4.0–10.5)
nRBC: 0 % (ref 0.0–0.2)

## 2019-02-21 LAB — BRAIN NATRIURETIC PEPTIDE: B Natriuretic Peptide: 13.6 pg/mL (ref 0.0–100.0)

## 2019-02-21 LAB — BASIC METABOLIC PANEL
Anion gap: 8 (ref 5–15)
BUN: 13 mg/dL (ref 6–20)
CO2: 26 mmol/L (ref 22–32)
Calcium: 9.1 mg/dL (ref 8.9–10.3)
Chloride: 104 mmol/L (ref 98–111)
Creatinine, Ser: 0.94 mg/dL (ref 0.44–1.00)
GFR calc Af Amer: >60 mL/min (ref >60)
GFR calc non Af Amer: >60 mL/min (ref >60)
Glucose, Bld: 106 mg/dL — ABNORMAL HIGH (ref 70–99)
Potassium: 4 mmol/L (ref 3.5–5.1)
Sodium: 138 mmol/L (ref 135–145)

## 2019-02-21 LAB — SARS CORONAVIRUS 2 (TAT 6-24 HRS): SARS Coronavirus 2: NEGATIVE

## 2019-02-21 LAB — TROPONIN I (HIGH SENSITIVITY)
Troponin I (High Sensitivity): 3 ng/L (ref <18)
Troponin I (High Sensitivity): 4 ng/L (ref <18)

## 2019-02-21 LAB — I-STAT BETA HCG BLOOD, ED (MC, WL, AP ONLY): I-stat hCG, quantitative: <5 m[IU]/mL (ref <5)

## 2019-02-21 NOTE — ED Provider Notes (Signed)
[facility] EMERGENCY DEPARTMENT Provider Note   CSN: [id number] Arrival date & time: 02/21/19  1113     History   Chief Complaint Chief Complaint  Patient presents with   Shortness of Breath    HPI Diana Blair is a 38 y.o. female with past medical history significant for hypertension, diverticulitis, morbid obesity, PCOS presents to emergency room today with chief complaint of shortness of breath and cough.  She states this has been going on x1 month. Patient was seen in the emergency department on 01/08/2019 and diagnosed with community-acquired pneumonia.  She was treated with Levaquin and reports compliance with medication and she finished it on 01/15/2019.  She had a Covid test at the time that was negative. She went to urgent care x2 days ago where she had a chest x-ray with a radiology report of no improvement and possibly worsening pneumonia with recommendation of a CT chest.  Patient states she had issues with childcare and was unable to get to the emergency department till today.  She was started on Augmentin and Z-Pak by urgent care provider.  Today she has been in continued shortness of breath and states she is overall does not feel better from her recent pneumonia.  She has dyspnea on exertion with left-sided chest discomfort.  She has no fever, chills, body aches or night sweats, calf pain, tenderness or swelling.  She did denies any sick contacts.  Has not been around anyone that tested positive for COVID-19.  Past Medical History:  Diagnosis Date   Chronic hypertension in obstetric context in first trimester 05/04/2016   Diverticulitis    Heart palpitations    Morbid obesity with BMI of 50.0-59.9, adult (HCC)    BMI 58.8   PCOS (polycystic ovarian syndrome)    Seizure ([place])    as a child    Patient Active Problem List   Diagnosis Date Noted   Advanced maternal age in multigravida 11/10/2016   Chronic hypertension in pregnancy 11/10/2016    IUGR (intrauterine growth restriction) affecting care of mother, third trimester, other fetus 11/05/2016   BMI 60.0-69.9, adult ([place]) 05/04/2016   History of preterm delivery 08/20/2015   History of cervical cerclage 08/20/2015   PCOS (polycystic ovarian syndrome) 02/08/2013    Past Surgical History:  Procedure Laterality Date   CERVICAL CERCLAGE  03/13/2012   Procedure: CERCLAGE CERVICAL;  Surgeon: Jonnie Kind, MD;  Location: [facility];  Service: Gynecology;  Laterality: N/A;   CERVICAL CERCLAGE  03/17/2012   Procedure: CERCLAGE CERVICAL;  Surgeon: Osborne Oman, MD;  Location: [facility];  Service: Gynecology;  Laterality: N/A;  Removal     OB History    Gravida  2   Para  2   Term  1   Preterm  1   AB  0   Living  1     SAB  0   TAB  0   Ectopic  0   Multiple  0   Live Births  2            Home Medications    Prior to Admission medications   Medication Sig Start Date End Date Taking? Authorizing Provider  amoxicillin-clavulanate (AUGMENTIN) 875-125 MG tablet Take 1 tablet by mouth every 12 (twelve) hours. 02/19/19   Loura Halt A, NP  azithromycin (ZITHROMAX) 250 MG tablet Take 1 tablet (250 mg total) by mouth daily. Take first 2 tablets together, then 1 every day until finished. 02/19/19  Bast, Traci A, NP  levofloxacin (LEVAQUIN) 750 MG tablet Take 1 tablet (750 mg total) by mouth daily. X 7 days 01/09/19   Mesner, Corene Cornea, MD  albuterol (PROVENTIL HFA;VENTOLIN HFA) 108 (90 Base) MCG/ACT inhaler Inhale 1-2 puffs into the lungs every 4 (four) hours as needed for wheezing or shortness of breath (or cough). Patient not taking: Reported on 01/09/2019 04/01/18 01/09/19  Street, [place], PA-C    Family History Family History  Problem Relation Age of Onset   Cancer Mother        lung   Hypertension Mother    Diabetes Father    Heart failure Father    Diabetes Paternal Grandmother    Hypertension Sister    Hypertension Brother    Rashes /  Skin problems Sister    Other Neg Hx     Social History Social History   Tobacco Use   Smoking status: Former Smoker    Types: Cigarettes    Quit date: 02/08/2009    Years since quitting: 10.0   Smokeless tobacco: Never Used  Substance Use Topics   Alcohol use: No   Drug use: No     Allergies   Patient has no known allergies.   Review of Systems Review of Systems  Constitutional: Negative for chills and fever.  HENT: Negative for congestion, ear discharge, ear pain, sinus pressure, sinus pain and sore throat.   Eyes: Negative for pain and redness.  Respiratory: Positive for cough and shortness of breath.   Cardiovascular: Positive for chest pain.  Gastrointestinal: Negative for abdominal pain, constipation, diarrhea, nausea and vomiting.  Genitourinary: Negative for dysuria and hematuria.  Musculoskeletal: Negative for back pain and neck pain.  Skin: Negative for wound.  Neurological: Negative for weakness, numbness and headaches.     Physical Exam Updated Vital Signs BP 127/78    Pulse 84    Temp 98.5 F (36.9 C) (Oral)    Resp (!) 22    LMP 02/21/2019 (Exact Date)    SpO2 100%    Breastfeeding No   Physical Exam Vitals signs and nursing note reviewed.  Constitutional:      General: She is not in acute distress.    Appearance: She is obese. She is not ill-appearing.  HENT:     Head: Normocephalic and atraumatic.     Right Ear: Tympanic membrane and external ear normal.     Left Ear: Tympanic membrane and external ear normal.     Nose: Nose normal.     Mouth/Throat:     Mouth: Mucous membranes are moist.     Pharynx: Oropharynx is clear.  Eyes:     General: No scleral icterus.       Right eye: No discharge.        Left eye: No discharge.     Extraocular Movements: Extraocular movements intact.     Conjunctiva/sclera: Conjunctivae normal.     Pupils: Pupils are equal, round, and reactive to light.  Neck:     Musculoskeletal: Normal range of motion.      Vascular: No JVD.  Cardiovascular:     Rate and Rhythm: Normal rate and regular rhythm.     Pulses: Normal pulses.          Radial pulses are 2+ on the right side and 2+ on the left side.     Heart sounds: Normal heart sounds.  Pulmonary:     Comments: Lung sounds diminished throughout. symmetric chest rise. No wheezing, rales, or  rhonchi heard.  Patient appears short of breath on my exam after ambulating from the wheelchair to the stretcher SPO2 was 98% on room air during exam Abdominal:     Comments: Abdomen is soft, non-distended, and non-tender in all quadrants. No rigidity, no guarding. No peritoneal signs.  Musculoskeletal: Normal range of motion.     Comments: Homans sign absent bilaterally, no lower extremity edema, no palpable cords, compartments are soft  Skin:    General: Skin is warm and dry.     Capillary Refill: Capillary refill takes less than 2 seconds.  Neurological:     Mental Status: She is oriented to person, place, and time.     GCS: GCS eye subscore is 4. GCS verbal subscore is 5. GCS motor subscore is 6.     Comments: Fluent speech, no facial droop.  Psychiatric:        Behavior: Behavior normal.      ED Treatments / Results  Labs (all labs ordered are listed, but only abnormal results are displayed) Labs Reviewed  BASIC METABOLIC PANEL - Abnormal; Notable for the following components:      Result Value   Glucose, Bld 106 (*)    All other components within normal limits  CBC - Abnormal; Notable for the following components:   MCH 25.7 (*)    RDW 15.8 (*)    All other components within normal limits  SARS CORONAVIRUS 2 (TAT 6-24 HRS)  BRAIN NATRIURETIC PEPTIDE  I-STAT BETA HCG BLOOD, ED ([facility], [facility], [facility] ONLY)  TROPONIN I (HIGH SENSITIVITY)  TROPONIN I (HIGH SENSITIVITY)    EKG EKG Interpretation  Date/Time:  Wednesday February 21 2019 11:29:42 EDT Ventricular Rate:  82 PR Interval:  146 QRS Duration: 84 QT Interval:  368 QTC Calculation: 429 R  Axis:   44 Text Interpretation: Normal sinus rhythm with sinus arrhythmia Normal ECG Confirmed by Isla Pence [id number]) on 02/21/2019 5:08:59 PM   Radiology Dg Chest 2 View  Result Date: 02/21/2019 CLINICAL DATA:  Shortness of breath. EXAM: CHEST - 2 VIEW COMPARISON:  Chest x-rays dated 02/19/2019, 01/08/2019 and 08/10/2017 FINDINGS: Faint patchy infiltrates in both upper lobes as well as in the right perihilar region are unchanged since 02/19/2019 and minimally progressed since 08/10/2017. No effusions. Heart size and vascularity are normal. No bone abnormality. IMPRESSION: Chronic pulmonary infiltrative changes since 08/10/2017. No acute abnormalities. CT scan of the chest without contrast may better characterize the abnormalities. Electronically Signed   By: Lorriane Shire M.D.   On: 02/21/2019 12:05   Ct Chest Wo Contrast  Result Date: 02/21/2019 CLINICAL DATA:  Shortness of breath is EXAM: CT CHEST WITHOUT CONTRAST TECHNIQUE: Multidetector CT imaging of the chest was performed following the standard protocol without IV contrast. COMPARISON:  Chest x-ray today.  Chest CT 09/07/2013 FINDINGS: Cardiovascular: Heart is normal size.  Aorta is normal caliber. Mediastinum/Nodes: Small scattered mediastinal lymph nodes, none pathologically enlarged. No mediastinal, hilar, or axillary adenopathy. Lungs/Pleura: There are bilateral patchy airspace opacities noted, some of which are masslike. This most likely reflects multifocal pneumonia. Atypical infection is possible. No effusions. Upper Abdomen: Imaging into the upper abdomen shows no acute findings. Diffuse fatty infiltration of the liver. Musculoskeletal: Chest wall soft tissues are unremarkable. No acute bony abnormality. IMPRESSION: Patchy bilateral airspace opacities, some of which appear masslike. These most likely reflect multifocal pneumonia. Atypical pneumonia is possible. Recommend follow-up after treatment to ensure resolution. Fatty  infiltration of the liver. Electronically Signed   By: Lennette Bihari  [place] M.D.   On: 02/21/2019 16:45    Procedures Procedures (including critical care time)  Medications Ordered in ED Medications - No data to display   Initial Impression / Assessment and Plan / ED Course  I have reviewed the triage vital signs and the nursing notes.  Pertinent labs & imaging results that were available during my care of the patient were reviewed by me and considered in my medical decision making (see chart for details).  Patient seen and examined.  Patient is nontoxic-appearing.  She is mild respiratory distress after ambulating from the wheelchair to the stretcher however no hypoxia is noted.  She is afebrile, no tachycardia, normotensive.  On exam her lung sounds are diminished but no wheezing, rales or rhonchi are heard.  Symmetric chest rise.  No accessory muscle use.  Abdomen is nontender, no peritoneal signs.  Negative Homans' sign bilaterally.  She is PERC negative.  Labs ordered in triage.  I reviewed these results which are significant for negative pregnancy test, BNP within normal range.  No leukocytosis, no anemia, no severe electrolyte derangement, no renal insufficiencies.  Delta troponin is negative.  Patient also had chest x-ray viewed by me which shows no acute abnormalities but comments on chronic pulmonary changes when compared to previous imaging.  CT chest without contrast recommended and ordered in triage.  EKG viewed by me shows normal sinus rhythm.  CT shows multifocal pneumonia.  I ambulated patient in the exam room and she did so without distress.  No hypoxia, SPO2 stayed above 95%.  Given her reassuring vital signs and exam today patient is stable to be discharged home. She was already prescribed Augmentin and a Z-Pak, recommend she continue these for pneumonia treatment.  Covid test is pending.  Patient aware she will need to self quarantine until she has the results of the Covid  test.   The patient appears reasonably screened and/or stabilized for discharge and I doubt any other medical condition or other [facility] requiring further screening, evaluation, or treatment in the ED at this time prior to discharge. The patient is safe for discharge with strict return precautions discussed. Recommend pcp follow up if needed.  Patient already has an appointment scheduled with pulmonology for further evaluation of her recurrent pneumonia. Findings and plan of care discussed with supervising physician Dr. Gilford Raid.    Portions of this note were generated with [employer]. Dictation errors may occur despite best attempts at proofreading.  YELONDA ASSEFA was evaluated in Emergency Department on 02/21/2019 for the symptoms described in the history of present illness. She was evaluated in the context of the global COVID-19 pandemic, which necessitated consideration that the patient might be at risk for infection with the SARS-CoV-2 virus that causes COVID-19. Institutional protocols and algorithms that pertain to the evaluation of patients at risk for COVID-19 are in a state of rapid change based on information released by regulatory bodies including the CDC and federal and state organizations. These policies and algorithms were followed during the patient's care in the ED.   Final Clinical Impressions(s) / ED Diagnoses   Final diagnoses:  Multifocal pneumonia  At increased risk of exposure to COVID-19 virus    ED Discharge Orders    None       Cherre Robins, PA-C 02/21/19 1726    Isla Pence, MD 02/21/19 [place]

## 2019-02-21 NOTE — ED Triage Notes (Signed)
Pt endorses having pneumonia for 2 months. Was at [facility] in September and finished antibiotics and is still not any better. Went to [facility] on Monday and they wanted her to come here for CT scan. Axox4. VSS.

## 2019-02-21 NOTE — ED Notes (Signed)
Patient verbalizes understanding of discharge instructions. Opportunity for questioning and answers were provided. Pt discharged from ED. 

## 2019-02-21 NOTE — Discharge Instructions (Signed)
You have been seen today for shortness of breath. Please read and follow all provided instructions. Return to the emergency room for worsening condition or new concerning symptoms.  It is very important that you watch for fever, worsening shortness of breath or low oxygen levels.  If you have any of the symptoms you need to return to the emergency department  Your CT scan today showed that you have multifocal pneumonia.  Please continue taking your antibiotics as prescribed by the urgent care doctor.  1. Medications:  -Continue usual home medications.  You can take Tylenol and ibuprofen for pain as needed.  Please take as prescribed.  2. Treatment: rest, drink plenty of fluids  3. Follow Up: Please follow up with your primary doctor in 2-5 days for discussion of your diagnoses and further evaluation after today's visit; Call today to arrange your follow up if needed. -Also recommend you keep your follow-up appointment with pulmonology as scheduled It is also a possibility that you have an allergic reaction to any of the medicines that you have been prescribed - Everybody reacts differently to medications and while MOST people have no trouble with most medicines, you may have a reaction such as nausea, vomiting, rash, swelling, shortness of breath. If this is the case, please stop taking the medicine immediately and contact your physician.  ?

## 2019-02-26 ENCOUNTER — Telehealth: Payer: Self-pay | Admitting: Obstetrics and Gynecology

## 2019-02-26 ENCOUNTER — Other Ambulatory Visit: Payer: Self-pay

## 2019-02-26 ENCOUNTER — Encounter: Payer: Self-pay | Admitting: Internal Medicine

## 2019-02-26 ENCOUNTER — Ambulatory Visit: Payer: Medicaid Other | Admitting: Internal Medicine

## 2019-02-26 DIAGNOSIS — R918 Other nonspecific abnormal finding of lung field: Secondary | ICD-10-CM

## 2019-02-26 DIAGNOSIS — Z6841 Body Mass Index (BMI) 40.0 and over, adult: Secondary | ICD-10-CM | POA: Diagnosis not present

## 2019-02-26 DIAGNOSIS — R05 Cough: Secondary | ICD-10-CM

## 2019-02-26 DIAGNOSIS — R058 Other specified cough: Secondary | ICD-10-CM | POA: Insufficient documentation

## 2019-02-26 DIAGNOSIS — N898 Other specified noninflammatory disorders of vagina: Secondary | ICD-10-CM

## 2019-02-26 LAB — CBC WITH DIFFERENTIAL/PLATELET
Basophils Absolute: 0 10*3/uL (ref 0.0–0.1)
Basophils Relative: 0.4 % (ref 0.0–3.0)
Eosinophils Absolute: 0.3 10*3/uL (ref 0.0–0.7)
Eosinophils Relative: 4.2 % (ref 0.0–5.0)
HCT: 37.6 % (ref 36.0–46.0)
Hemoglobin: 12 g/dL (ref 12.0–15.0)
Lymphocytes Relative: 11.6 % — ABNORMAL LOW (ref 12.0–46.0)
Lymphs Abs: 0.8 10*3/uL (ref 0.7–4.0)
MCHC: 31.9 g/dL (ref 30.0–36.0)
MCV: 80 fl (ref 78.0–100.0)
Monocytes Absolute: 0.8 10*3/uL (ref 0.1–1.0)
Monocytes Relative: 11.9 % (ref 3.0–12.0)
Neutro Abs: 4.7 10*3/uL (ref 1.4–7.7)
Neutrophils Relative %: 71.9 % (ref 43.0–77.0)
Platelets: 275 10*3/uL (ref 150.0–400.0)
RBC: 4.71 Mil/uL (ref 3.87–5.11)
RDW: 16.9 % — ABNORMAL HIGH (ref 11.5–15.5)
WBC: 6.5 10*3/uL (ref 4.0–10.5)

## 2019-02-26 LAB — SEDIMENTATION RATE: Sed Rate: 70 mm/hr — ABNORMAL HIGH (ref 0–20)

## 2019-02-26 LAB — SARS-COV-2 IGG: SARS-COV-2 IgG: 0.05

## 2019-02-26 MED ORDER — PREDNISONE 10 MG PO TABS: ORAL_TABLET | ORAL | 0 refills | Status: DC

## 2019-02-26 MED ORDER — PANTOPRAZOLE SODIUM 40 MG PO TBEC: 40.0000 mg | DELAYED_RELEASE_TABLET | Freq: Every day | ORAL | 2 refills | Status: DC

## 2019-02-26 MED ORDER — FAMOTIDINE 20 MG PO TABS: ORAL_TABLET | ORAL | 11 refills | Status: DC

## 2019-02-26 NOTE — Telephone Encounter (Signed)
The patient would like a prescription for yeast infection medication. She visits the [facility] on [address].

## 2019-02-26 NOTE — Patient Instructions (Addendum)
For cough or sensation throat / chest congestion  >  delsym 2 tsp every 12 hours as needed  GERD (REFLUX)  is an extremely common cause of respiratory symptoms just like yours , many times with no obvious heartburn at all.    It can be treated with medication, but also with lifestyle changes including elevation of the head of your bed (ideally with 6 -8inch blocks under the headboard of your bed),  Smoking cessation, avoidance of late meals, excessive alcohol, and avoid fatty foods, chocolate, peppermint, colas, red wine, and acidic juices such as orange juice.  NO MINT OR MENTHOL PRODUCTS SO NO COUGH DROPS  USE SUGARLESS CANDY INSTEAD (Jolley ranchers or Stover's or Life Savers) or even ice chips will also do - the key is to swallow to prevent all throat clearing. NO OIL BASED VITAMINS - use powdered substitutes.  Avoid fish oil when coughing.  Prednisone 10 mg take  4 each am x 2 days,   2 each am x 2 days,  1 each am x 2 days and stop    Please remember to go to the lab department   for your tests - we will call you with the results when they are available.  Please schedule a follow up office visit in 2 weeks, sooner if needed  with all medications /inhalers/ solutions in hand so we can verify exactly what you are taking. This includes all medications from all doctors and over the counters

## 2019-02-26 NOTE — Progress Notes (Signed)
Diana Blair, female    DOB: 1981-04-23,   MRN: [id number]   Brief patient profile:  9 yobf  Quit smoking 2010  Last IUP 2018  ? Allergy as  child and freq "bronchitis" and pattern continued as adult  up to as often as once  yearly and no change p quit smoking esp severe pattern since 2019  assoc with nasal congestion and similar onset flare   Aug 2020   failure to respond to 3 different abx for "pna/ bronchitis" so referred to pulmonary clinic 02/26/2019 by Dr   Dolly Rias.    History of Present Illness  02/26/2019  Pulmonary/ 1st office eval/[facility]  Chief Complaint  Patient presents with  . Pulmonary Consult    Referred by Dr Rivka Barbara for eval of PNA.  Pt states that she has had PNA since Aug 2020 and is on her 3rd round of abx. She c/o SOB. She has some cough at night- prod with clear sputum.   Dyspnea:  MMRC3 = can't walk 100 yards even at a slow pace at a flat grade s stopping due to sob Cough: evenings are the worst with sensaton of globus but no excess mucus for forced cough   Sleep: flat in bed / best she feels best first thing in am and denies noct cough  SABA use: not using  No fever, no localized or pleuritic cp. Some assoc migrating arthralgias  Sensation of mucus stuck in chest  > was told this was fluid so is convinced this is correct.    No obvious day to day or daytime variability or assoc excess/ purulent sputum or mucus plugs or hemoptysis or cp or chest tightness, subjective wheeze or overt   hb symptoms.   Sleeping  without nocturnal  or early am exacerbation  of respiratory  c/o's or need for noct saba. Also denies any obvious fluctuation of symptoms with weather or environmental changes or other aggravating or alleviating factors except as outlined above   No unusual exposure hx or h/o childhood pna or knowledge of premature birth.  Current Allergies, Complete Past Medical History, Past Surgical History, Family History, and Social History were reviewed in [employer] record.  ROS  The following are not active complaints unless bolded Hoarseness, sore throat, dysphagia, dental problems, itching, sneezing,  nasal congestion or discharge of excess mucus or purulent secretions, ear ache,   fever, chills, sweats, unintended wt loss or wt gain, classically pleuritic or exertional cp,  orthopnea pnd or arm/hand swelling  or leg swelling, presyncope, palpitations, abdominal pain, anorexia, nausea, vomiting, diarrhea  or change in bowel habits or change in bladder habits, change in stools or change in urine, dysuria, hematuria,  rash, arthralgias shoulders, wrists ankles , visual complaints, headache, numbness, weakness or ataxia or problems with walking or coordination,  change in mood or  memory.           Past Medical History:  Diagnosis Date  . 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 Diverticulitis   . Heart palpitations   . Morbid obesity with BMI of 50.0-59.9, adult (HCC)    BMI 58.8  . PCOS (polycystic ovarian syndrome)   . Seizure [facility])    as a child    Outpatient Medications Prior to Visit  Medication Sig Dispense Refill  . amoxicillin-clavulanate (AUGMENTIN) 875-125 MG tablet Take 1 tablet by mouth every 12 (twelve) hours. 14 tablet 0  . azithromycin (ZITHROMAX) 250 MG tablet  Take 1 tablet (250 mg total) by mouth daily. Take first 2 tablets together, then 1 every day until finished. 6 tablet 0  . levofloxacin (LEVAQUIN) 750 MG tablet Take 1 tablet (750 mg total) by mouth daily. X 7 days 7 tablet 0      Objective:     BP 134/82 (BP Location: Left Arm, Cuff Size: Large)   Pulse 87   Temp (!) 97.5 F (36.4 C) (Temporal)   Ht 5' 3" (1.6 m)   Wt (!) 390 lb (176.9 kg)   LMP 02/21/2019 (Exact Date)   SpO2 98% Comment: on RA  BMI 69.09 kg/m   SpO2: 98 %(on RA)    amb MO (by BMI)  bf    Who tends  answer questions with ambiguous medical terms or diagnoses (bronchitis, pna, fluid on the  lung) and seemed at times aggravated  when asked the same question more than once for clarification.    Cough is harsh and dry sounding    HEENT : pt wearing mask not removed for exam due to covid -19 concerns.    NECK :  without JVD/Nodes/TM/ nl carotid upstrokes bilaterally   LUNGS: no acc muscle use,  Nl contour chest which is clear to A and P bilaterally without cough on insp or exp maneuvers   CV:  RRR  no s3 or murmur or increase in P2, and no edema   ABD:  soft and nontender with nl inspiratory excursion in the supine position. No bruits or organomegaly appreciated, bowel sounds nl  MS:  Nl gait/ ext warm without deformities, calf tenderness, cyanosis or clubbing No obvious joint restrictions   SKIN: warm and dry without lesions    NEURO:  alert, approp, nl sensorium with  no motor or cerebellar deficits apparent.     Lab Results  Component Value Date   ESRSEDRATE 70 (H) 02/26/2019      Labs ordered 02/26/2019  :  Quant TB  / ACE/ IgE   Lab Results  Component Value Date   WBC 6.5 02/26/2019   HGB 12.0 02/26/2019   HCT 37.6 02/26/2019   MCV 80.0 02/26/2019   PLT 275.0 02/26/2019       EOS                                                              0.3                                     02/26/2019     Assessment   Pulmonary infiltrates on CXR Persistent esp RUL since 02/11/2018  - CT chest 02/21/2019 Patchy bilateral airspace opacities, some of which appear masslike. These most likely reflect multifocal pneumonia. Atypical pneumonia is possible. Recommend follow-up after treatment to ensure Resolution. My impression:  Greatest area is RUL apically  - ESR  02/26/2019 = 70   Miscellaneous:Alv microlithiasis, alv proteinosis, asp, bronchiectais, BOOP   ARDS/ AIP Occupational dz/ HSP Neoplasm Infection Drug  Pulmonary emboli, Protein disorders Edema/Eosinophilic dz Sarcoidosis Connective tissue dz Hist X / Hemorrhage Idiopathic   ddx highlighted as  above but this is clearly no "fluid on the lung" nor pneumonia so rec short course pred  while waiting for labs and f/u in 2 weeks with all meds in hand using a trust but verify approach to confirm accurate Medication  Reconciliation The principal here is that until we are certain that the  patients are doing what we've asked, it makes no sense to ask them to do more.      Upper airway cough syndrome Onset ? 2019 absent during sleep, feels best first thing in am and not reproduced with insp/exp  c/w cyclical cough - max rx for gerd 02/26/2019   Of the three most common causes of  Sub-acute / recurrent or chronic cough, only one (GERD)  can actually contribute to/ trigger  the other two (asthma and post nasal drip syndrome)  and perpetuate the cylce of cough.  While not intuitively obvious, many patients with chronic low grade reflux do not cough until there is a primary insult that disturbs the protective epithelial barrier and exposes sensitive nerve endings.   This is typically viral but can due to PNDS and  either may apply here.   The point is that once this occurs, it is difficult to eliminate the cycle  using anything but a maximally effective acid suppression regimen at least in the short run, accompanied by an appropriate diet to address non acid GERD and control / eliminate the cough is possible with delsym and regroup in 2 weeks      BMI 60.0-69.9, adult ([place]) Body mass index is 69.09 kg/m.   Lab Results  Component Value Date   TSH 0.91 05/04/2016     Contributing to gerd risk/ doe/reviewed the need and the process to achieve and maintain neg calorie balance > defer f/u primary care including intermittently monitoring thyroid status     Total time devoted to counseling  > 50 % of initial 60 min office visit:  review case with pt/ discussion of options/alternatives/ personally creating written customized instructions  in presence of pt  then going over those specific  Instructions  directly with the pt including how to use all of the meds but in particular covering each new medication in detail and the difference between the maintenance= "automatic" meds and the prns using an action plan format for the latter (If this problem/symptom => do that organization reading Left to right).  Please see AVS from this visit for a full list of these instructions which I personally wrote for this pt and  are unique to this visit.      Christinia Gully, MD 02/26/2019

## 2019-02-26 NOTE — Assessment & Plan Note (Signed)
Persistent esp RUL since 02/11/2018  - CT chest 02/21/2019 Patchy bilateral airspace opacities, some of which appear masslike. These most likely reflect multifocal pneumonia. Atypical pneumonia is possible. Recommend follow-up after treatment to ensure Resolution. My impression:  Greatest area is RUL apically  - ESR  02/26/2019 = 70   Miscellaneous:Alv microlithiasis, alv proteinosis, asp, bronchiectais, BOOP   ARDS/ AIP Occupational dz/ HSP Neoplasm Infection Drug  Pulmonary emboli, Protein disorders Edema/Eosinophilic dz Sarcoidosis Connective tissue dz Hist X / Hemorrhage Idiopathic   ddx highlighted as above but this is clearly no "fluid on the lung" nor pneumonia so rec short course pred while waiting for labs and f/u in 2 weeks with all meds in hand using a trust but verify approach to confirm accurate Medication  Reconciliation The principal here is that until we are certain that the  patients are doing what we've asked, it makes no sense to ask them to do more.

## 2019-02-26 NOTE — Assessment & Plan Note (Signed)
Onset ? 2019 absent during sleep, feels best first thing in am and not reproduced with insp/exp  c/w cyclical cough - max rx for gerd 02/26/2019   Of the three most common causes of  Sub-acute / recurrent or chronic cough, only one (GERD)  can actually contribute to/ trigger  the other two (asthma and post nasal drip syndrome)  and perpetuate the cylce of cough.  While not intuitively obvious, many patients with chronic low grade reflux do not cough until there is a primary insult that disturbs the protective epithelial barrier and exposes sensitive nerve endings.   This is typically viral but can due to PNDS and  either may apply here.   The point is that once this occurs, it is difficult to eliminate the cycle  using anything but a maximally effective acid suppression regimen at least in the short run, accompanied by an appropriate diet to address non acid GERD and control / eliminate the cough is possible with delsym and regroup in 2 weeks

## 2019-02-26 NOTE — Assessment & Plan Note (Signed)
Body mass index is 69.09 kg/m.   Lab Results  Component Value Date   TSH 0.91 05/04/2016     Contributing to gerd risk/ doe/reviewed the need and the process to achieve and maintain neg calorie balance > defer f/u primary care including intermittently monitoring thyroid status     Total time devoted to counseling  > 50 % of initial 60 min office visit:  review case with pt/ discussion of options/alternatives/ personally creating written customized instructions  in presence of pt  then going over those specific  Instructions directly with the pt including how to use all of the meds but in particular covering each new medication in detail and the difference between the maintenance= "automatic" meds and the prns using an action plan format for the latter (If this problem/symptom => do that organization reading Left to right).  Please see AVS from this visit for a full list of these instructions which I personally wrote for this pt and  are unique to this visit.

## 2019-02-27 ENCOUNTER — Encounter: Payer: Self-pay | Admitting: Internal Medicine

## 2019-02-27 MED ORDER — FLUCONAZOLE 150 MG PO TABS: 150.0000 mg | ORAL_TABLET | Freq: Once | ORAL | 0 refills | Status: AC

## 2019-02-27 NOTE — Telephone Encounter (Signed)
I called Vinnie back and left a message I was calling to discuss her request and that I am sending a detailed MyChart message. Please read and respond to the MyChart message or call us. Linda,RN

## 2019-02-27 NOTE — Telephone Encounter (Signed)
Diana Blair called back and confirmed she is having vaginal itching after taking antibiotics. I informed her I will send diflucan to her pharmacy. I also informed her it is time for annual exam/ pap and she confirmed she has already scheduled an appointment.  Kit Mollett,RN

## 2019-02-27 NOTE — Telephone Encounter (Signed)
Opened in error

## 2019-02-27 NOTE — Addendum Note (Signed)
Addended by: Samuel Germany on: 02/27/2019 10:46 AM   Modules accepted: Orders

## 2019-02-28 LAB — QUANTIFERON-TB GOLD PLUS
Mitogen-NIL: >10 IU/mL
NIL: 0.05 IU/mL
QuantiFERON-TB Gold Plus: NEGATIVE
TB1-NIL: 0 IU/mL
TB2-NIL: 0 IU/mL

## 2019-02-28 LAB — IGE: IgE (Immunoglobulin E), Serum: 20 kU/L (ref <=114)

## 2019-02-28 LAB — ANGIOTENSIN CONVERTING ENZYME: Angiotensin-Converting Enzyme: 98 U/L — ABNORMAL HIGH (ref 9–67)

## 2019-03-01 ENCOUNTER — Telehealth: Payer: Self-pay | Admitting: Internal Medicine

## 2019-03-01 ENCOUNTER — Other Ambulatory Visit: Payer: Self-pay

## 2019-03-01 ENCOUNTER — Encounter: Payer: Self-pay | Admitting: Internal Medicine

## 2019-03-01 ENCOUNTER — Ambulatory Visit: Payer: Medicaid Other | Admitting: Internal Medicine

## 2019-03-01 DIAGNOSIS — Z6841 Body Mass Index (BMI) 40.0 and over, adult: Secondary | ICD-10-CM

## 2019-03-01 DIAGNOSIS — R05 Cough: Secondary | ICD-10-CM | POA: Diagnosis not present

## 2019-03-01 DIAGNOSIS — R918 Other nonspecific abnormal finding of lung field: Secondary | ICD-10-CM

## 2019-03-01 DIAGNOSIS — R058 Other specified cough: Secondary | ICD-10-CM

## 2019-03-01 NOTE — Assessment & Plan Note (Addendum)
Persistent esp RUL since 02/11/2018 plain cxr  - CT chest 02/21/2019 Patchy bilateral airspace opacities, some of which appear masslike. These most likely reflect multifocal pneumonia. Atypical pneumonia is possible. Recommend follow-up after treatment to ensure Resolution. My impression:  Greatest area is RUL apically  - ESR  02/26/2019 = 70 - ACE 02/26/2019 = 98  - Quant Gold TB 02/26/2019 = neg   Best bet is that this is sarcoid as already symptomatically improved though unclear whether this is due to controlling gerd or to the effects of short course prednisone. Will have her complete 4 more days of pred and then see which if any symptoms recur on gerd rx/ diet and if they do the next step is FOB with transbronchial bx RUL apical segment/ advised  Discussed Sarcoidosis is a benign inflammatory condition caused by  The  immune system being too revved up like a thermostat on your furnace that's partially  stuck causing arthitis, rash, short of breath and cough and vision issues.  It typically burns itself out in 75% of patients by the end of 3 years with little to indicate that we really change the natural course of the disease by aggressive treatments intended to alter it.  Treatment is generally reserved for patients with major symptoms we can attribute to sarcoid or if a vital organ (like the eye or kidney or nervous system) becomes affected.   Her cxr is really not changed from a year ago and is not per se an indication for longterm prednisone so no need for immediate bx in this setting.   Discussed in detail all the  indications, usual  risks and alternatives  relative to the benefits with patient who agrees to proceed with rx/ w/u and f/u as outlined.

## 2019-03-01 NOTE — Assessment & Plan Note (Signed)
Onset ? 2019 absent during sleep, feels best first thing in am and not reproduced with insp/exp  c/w cyclical cough - max rx for gerd 02/26/2019 > much better 03/01/2019 though also received pred for possible sarcoid so will wean off pred and see if flares while still on gerd rx

## 2019-03-01 NOTE — Assessment & Plan Note (Signed)
Body mass index is 68.94 kg/m.  -  trending no change  Lab Results  Component Value Date   TSH 0.91 05/04/2016     Contributing to gerd risk/ doe/reviewed the need and the process to achieve and maintain neg calorie balance > defer f/u primary care including intermittently monitoring thyroid status     I had an extended discussion with the patient reviewing all relevant studies completed to date and  lasting 15 to 20 minutes of a 25 minute visit    Each maintenance medication was reviewed in detail including most importantly the difference between maintenance and prns and under what circumstances the prns are to be triggered using an action plan format that is not reflected in the computer generated alphabetically organized AVS.     Please see AVS for specific instructions unique to this visit that I personally wrote and verbalized to the the pt in detail and then reviewed with pt  by my nurse highlighting any  changes in therapy recommended at today's visit to their plan of care.

## 2019-03-01 NOTE — Telephone Encounter (Signed)
Called and spoke to patient, relayed results per Dr. Melvyn Novas as below.  Patient stated she would like to have OV to talk about what this means in person.  Patient wanted first available because she is anxious about meaning of everything and plans to keep her already scheduled OV for 03/12/2019. Scheduled patient with Dr. Melvyn Novas for this afternoon. Nothing further needed at this time.   Result note:  "Notes recorded by Tanda Rockers, MD on 02/28/2019 at 5:40 PM EST  Call patient : Studies are c/w sarcoid which is a benign condition and sometimes the way to tell she has it is how she responds to the treatment already given - that's why I rec a 2 week f/u after the first eval - Be sure patient has/keeps f/u ov so we can go over all the details of this study and get a plan together moving forward - ok to move up f/u if not feeling better and wants to be seen sooner"

## 2019-03-01 NOTE — Progress Notes (Signed)
LMTCB

## 2019-03-01 NOTE — Progress Notes (Signed)
Diana Blair, female    DOB: 11/07/1980,   MRN: [id number]   Brief patient profile:  65 yobf  Quit smoking 2010  Last IUP 2018  ? Allergy as  child and  And dx as teenage as freq "bronchitis" and pattern continued as adult  up to as often as once  yearly and no change p quit smoking esp severe pattern since 2019  assoc with nasal congestion and similar onset flare   Aug 2020   failure to respond to 3 different abx for "pna/ bronchitis" so referred to pulmonary clinic 02/26/2019 by Dr   Dolly Rias.    History of Present Illness  02/26/2019  Pulmonary/ 1st office eval/Wert  Chief Complaint  Patient presents with  . Pulmonary Consult    Referred by Dr Rivka Barbara for eval of PNA.  Pt states that she has had PNA since Aug 2020 and is on her 3rd round of abx. She c/o SOB. She has some cough at night- prod with clear sputum.   Dyspnea:  MMRC3 = can't walk 100 yards even at a slow pace at a flat grade s stopping due to sob Cough: evenings are the worst with sensaton of globus but no excess mucus for forced cough   Sleep: flat in bed / best she feels best first thing in am and denies noct cough  SABA use: not using  No fever, no localized or pleuritic cp. Some assoc migrating arthralgias  Sensation of mucus stuck in chest  > was told this was fluid so is convinced this is correct.  rec For cough or sensation throat / chest congestion  >  delsym 2 tsp every 12 hours as needed GERD  Diet  Prednisone 10 mg take  4 each am x 2 days,   2 each am x 2 days,  1 each am x 2 days and stop  Please remember to go to the lab department   for your tests - we will call you with the results when they are available. Please schedule a follow up office visit in 2 weeks, sooner if needed  with all medications /inhalers/ solutions in hand so we can verify exactly what you are taking. This includes all medications from all doctors and over the counters   03/01/2019  f/u ov/Wert re:  Cough/ sob/ pulmonary infiltrates   Chief Complaint  Patient presents with  . Follow-up    Pt is here today to discuss the results of her labwork from last visit. Pt says that she has been doing well since last visit.  Dyspnea:  bettera >> Playing with toddler / less sob car to door today vs prior ov and parked further out  Cough: less sense of globus, food no longer  Feels as  Stuck/ no mucus  Sleeping: still feels good in am c am cough or noct resp symtpoms  SABA use: none 02: none    No obvious day to day or daytime variability or assoc excess/ purulent sputum or mucus plugs or hemoptysis or cp or chest tightness, subjective wheeze or overt sinus or hb symptoms.   Sleeping  without nocturnal  or early am exacerbation  of respiratory  c/o's or need for noct saba. Also denies any obvious fluctuation of symptoms with weather or environmental changes or other aggravating or alleviating factors except as outlined above   No unusual exposure hx or h/o childhood pna/ asthma or knowledge of premature birth.  Current Allergies, Complete Past Medical History, Past  Surgical History, Family History, and Social History were reviewed in [employer] record.  ROS  The following are not active complaints unless bolded Hoarseness, sore throat, dysphagia, dental problems, itching, sneezing,  nasal congestion or discharge of excess mucus or purulent secretions, ear ache,   fever, chills, sweats, unintended wt loss or wt gain, classically pleuritic or exertional cp,  orthopnea pnd or arm/hand swelling  or leg swelling, presyncope, palpitations, abdominal pain, anorexia, nausea, vomiting, diarrhea  or change in bowel habits or change in bladder habits, change in stools or change in urine, dysuria, hematuria,  rash, arthralgias, visual complaints, headache, numbness, weakness or ataxia or problems with walking or coordination,  change in mood or  memory.        Current Meds  Medication Sig  . famotidine (PEPCID) 20 MG  tablet One after supper  . pantoprazole (PROTONIX) 40 MG tablet Take 1 tablet (40 mg total) by mouth daily. Take 30-60 min before first meal of the day  . predniSONE (DELTASONE) 10 MG tablet Take  4 each am x 2 days,   2 each am x 2 days,  1 each am x 2 days and stop                Past Medical History:  Diagnosis Date  . 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 Diverticulitis   . Heart palpitations   . Morbid obesity with BMI of 50.0-59.9, adult (HCC)    BMI 58.8  . PCOS (polycystic ovarian syndrome)   . Seizure [facility])    as a child      Objective:      obese amb bf nad  Wt Readings from Last 3 Encounters:  03/01/19 (!) 389 lb 3.2 oz (176.5 kg)  02/26/19 (!) 390 lb (176.9 kg)  02/19/19 (!) 495 lb (224.5 kg)    BP 122/80 (BP Location: Left Wrist, Patient Position: Sitting, Cuff Size: Normal)   Pulse 80   Ht 5\' 3"  (1.6 m)   Wt (!) 389 lb 3.2 oz (176.5 kg)   LMP 02/21/2019 (Exact Date)   SpO2 98% Comment: room air  BMI 68.94 kg/m       HEENT : pt wearing mask not removed for exam due to covid -19 concerns.    NECK :  without JVD/Nodes/TM/ nl carotid upstrokes bilaterally   LUNGS: no acc muscle use,  Nl contour chest which is clear to A and P bilaterally without cough on insp or exp maneuvers   CV:  RRR  no s3 or murmur or increase in P2, and no edema   ABD:  Quite obese but soft and nontender with nl inspiratory excursion in the supine position. No bruits or organomegaly appreciated, bowel sounds nl  MS:  Nl gait/ ext warm without deformities, calf tenderness, cyanosis or clubbing No obvious joint restrictions   SKIN: warm and dry without lesions    NEURO:  alert, approp, nl sensorium with  no motor or cerebellar deficits apparent.          Assessment

## 2019-03-01 NOTE — Patient Instructions (Signed)
Change prednisone to 10 mg one daily until done    Please schedule a follow up office visit in 4 weeks, sooner if needed for CXR return

## 2019-03-03 ENCOUNTER — Telehealth: Payer: Self-pay | Admitting: Internal Medicine

## 2019-03-03 NOTE — Telephone Encounter (Signed)
Call on weekend from [name] - new patient of Dr Melvyn Novas. Per chart and patient - started on prednisone, and gerd medicines. Now has cold from toddler -. Has post nasasl drip that is worse - choked patient. Sore throat +. APtient does not think this is covid  Recommend  = nasal saline spray  - nasal oxymetazoline prn (eg Afrin)  - OTC allegra or claritin prn - OTC lozenges prn  - at this time do not recommend prescription     SIGNATURE    Dr. Brand Males, M.D., F.C.C.P,  Pulmonary and Critical Care Medicine Staff Physician, [place] Director - Interstitial Lung Disease  Program  Pulmonary [place] [facility] [place], [place], [address]  Pager: [phone], If no answer or between  15:00h - 7:00h: call [phone] Telephone: [phone]  4:28 PM 03/03/2019

## 2019-03-04 ENCOUNTER — Encounter (HOSPITAL_COMMUNITY): Payer: Self-pay

## 2019-03-04 ENCOUNTER — Other Ambulatory Visit: Payer: Self-pay

## 2019-03-04 ENCOUNTER — Emergency Department (HOSPITAL_COMMUNITY)
Admission: EM | Admit: 2019-03-04 | Discharge: 2019-03-04 | Disposition: A | Discharge status: Home / Self Care | Payer: Medicaid Other | Attending: Emergency Medicine | Admitting: Emergency Medicine

## 2019-03-04 ENCOUNTER — Emergency Department (HOSPITAL_COMMUNITY): Payer: Medicaid Other

## 2019-03-04 DIAGNOSIS — I10 Essential (primary) hypertension: Secondary | ICD-10-CM | POA: Insufficient documentation

## 2019-03-04 DIAGNOSIS — R05 Cough: Secondary | ICD-10-CM | POA: Insufficient documentation

## 2019-03-04 DIAGNOSIS — R0602 Shortness of breath: Secondary | ICD-10-CM

## 2019-03-04 DIAGNOSIS — R0981 Nasal congestion: Secondary | ICD-10-CM | POA: Insufficient documentation

## 2019-03-04 DIAGNOSIS — Z79899 Other long term (current) drug therapy: Secondary | ICD-10-CM | POA: Insufficient documentation

## 2019-03-04 DIAGNOSIS — Z20828 Contact with and (suspected) exposure to other viral communicable diseases: Secondary | ICD-10-CM | POA: Diagnosis not present

## 2019-03-04 DIAGNOSIS — Z87891 Personal history of nicotine dependence: Secondary | ICD-10-CM | POA: Diagnosis not present

## 2019-03-04 DIAGNOSIS — R059 Cough, unspecified: Secondary | ICD-10-CM

## 2019-03-04 MED ORDER — AFRIN NASAL SPRAY 0.05 % NA SOLN: 1.0000 | Freq: Two times a day (BID) | NASAL | 0 refills | Status: AC

## 2019-03-04 MED ORDER — ALBUTEROL SULFATE HFA 108 (90 BASE) MCG/ACT IN AERS
1.0000 | INHALATION_SPRAY | Freq: Once | RESPIRATORY_TRACT | Status: AC
  Administered 2019-03-04: 1 via RESPIRATORY_TRACT
  Filled 2019-03-04: qty 6.7

## 2019-03-04 NOTE — ED Notes (Signed)
An After Visit Summary was printed and given to the patient. Discharge instructions given and no further questions at this time. Pt ambulatory with no difficulty at discharge.

## 2019-03-04 NOTE — Discharge Instructions (Addendum)
Your Covid test will result within 24 to 48 hours.  You will receive a phone call if the test is positive, no phone call if the test is negative.  Continue to quarantine at home per current CDC guidelines.  Start taking Afrin nasal spray twice daily for up to 3 days.  Do not take longer than 3 days.  Continue taking Zyrtec, Delsym.  You can use the albuterol inhaler 1 to 2 puffs every 4-6 hours as needed for shortness of breath.  Call your pulmonologist tomorrow for further recommendations and to schedule follow-up.  Return to the emergency department if any concerning signs or symptoms develop.

## 2019-03-04 NOTE — ED Notes (Signed)
Patient O2 reading 96%-98% RA while ambulating.

## 2019-03-04 NOTE — ED Provider Notes (Signed)
[facility] DEPT Provider Note   CSN: [id number] Arrival date & time: 03/04/19  1904     History   Chief Complaint Chief Complaint  Patient presents with  . Shortness of Breath  . multiple complaints    HPI Diana Blair is a 38 y.o. female with history of PCOS, hypertension, diverticulitis, morbid obesity presents today for evaluation of 3-day history of progressively worsening nasal congestion, postnasal drip, globus sensation, shortness of breath, nonproductive cough.  Reports that she recently established care with pulmonology last week for evaluation of shortness of breath, pulmonary infiltrates on x-ray, and what seemed to be recurrent bronchitis/pneumonia.  Pulmonology currently working patient up for sarcoidosis.  She reports that she had been having improvement with their treatment including antacids, Delsym for cough, prednisone taper.  Reports her 58-year-old daughter came home from daycare on Thursday 4 days ago with low-grade fever, rhinorrhea, cough.  Her symptoms have improved but the patient started exhibiting similar symptoms the day after.  She notes shortness of breath that will come on randomly, at times feels as though not that she is short of breath but that it is difficult to breathe.  Denies significant chest pain, nausea, vomiting, abdominal pain, fever.  She is a former smoker, quit several years ago.  She called on-call pulmonologist who per review of telephone encounter recommended starting Afrin, over-the-counter Allegra or Claritin and over-the-counter lozenges though she seems to think they recommended ED evaluation.  She denies any recent travel or surgeries, no hemoptysis, no prior history of DVT or PE.     The history is provided by the patient.    Past Medical History:  Diagnosis Date  . 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 Diverticulitis   . Heart palpitations   . Morbid obesity with  BMI of 50.0-59.9, adult (HCC)    BMI 58.8  . PCOS (polycystic ovarian syndrome)   . Seizure [facility])    as a child    Patient Active Problem List   Diagnosis Date Noted  . Pulmonary infiltrates on CXR 02/26/2019  . Upper airway cough syndrome 02/26/2019  . Advanced maternal age in multigravida 11/10/2016  . Chronic hypertension in pregnancy 11/10/2016  . IUGR (intrauterine growth restriction) affecting care of mother, third trimester, other fetus 11/05/2016  . BMI 60.0-69.9, adult ([facility]) 05/04/2016  . History of preterm delivery 08/20/2015  . History of cervical cerclage 08/20/2015  . PCOS (polycystic ovarian syndrome) 02/08/2013    Past Surgical History:  Procedure Laterality Date  . CERVICAL CERCLAGE  03/13/2012   Procedure: CERCLAGE CERVICAL;  Surgeon: Jonnie Kind, MD;  Location: [facility];  Service: Gynecology;  Laterality: N/A;  . CERVICAL CERCLAGE  03/17/2012   Procedure: CERCLAGE CERVICAL;  Surgeon: Osborne Oman, MD;  Location: [facility];  Service: Gynecology;  Laterality: N/A;  Removal     OB History    Gravida  2   Para  2   Term  1   Preterm  1   AB  0   Living  1     SAB  0   TAB  0   Ectopic  0   Multiple  0   Live Births  2            Home Medications    Prior to Admission medications   Medication Sig Start Date End Date Taking? Authorizing Provider  famotidine (PEPCID) 20 MG tablet One after supper 02/26/19  Tanda Rockers, MD  oxymetazoline (AFRIN NASAL SPRAY) 0.05 % nasal spray Place 1 spray into both nostrils 2 (two) times daily for 3 days. 03/04/19 03/07/19  Rodell Perna A, PA-C  pantoprazole (PROTONIX) 40 MG tablet Take 1 tablet (40 mg total) by mouth daily. Take 30-60 min before first meal of the day 02/26/19   Tanda Rockers, MD  predniSONE (DELTASONE) 10 MG tablet Take  4 each am x 2 days,   2 each am x 2 days,  1 each am x 2 days and stop 02/26/19   Tanda Rockers, MD  albuterol (PROVENTIL HFA;VENTOLIN HFA) 108 (90 Base) MCG/ACT  inhaler Inhale 1-2 puffs into the lungs every 4 (four) hours as needed for wheezing or shortness of breath (or cough). Patient not taking: Reported on 01/09/2019 04/01/18 01/09/19  Street, [place], PA-C    Family History Family History  Problem Relation Age of Onset  . Cancer Mother        lung  . Hypertension Mother   . Diabetes Father   . Heart failure Father   . Diabetes Paternal Grandmother   . Hypertension Sister   . Hypertension Brother   . Rashes / Skin problems Sister   . Other Neg Hx     Social History Social History   Tobacco Use  . Smoking status: Former Smoker    Packs/day: 1.50    Years: 6.00    Pack years: 9.00    Types: Cigarettes    Quit date: 02/08/2009    Years since quitting: 10.0  . Smokeless tobacco: Never Used  Substance Use Topics  . Alcohol use: No  . Drug use: No     Allergies   Patient has no known allergies.   Review of Systems Review of Systems  Constitutional: Negative for chills and fever.  HENT: Positive for congestion and postnasal drip.   Respiratory: Positive for cough, chest tightness and shortness of breath.   Cardiovascular: Negative for leg swelling.     Physical Exam Updated Vital Signs BP (!) 143/99   Pulse 88   Temp 99.3 F (37.4 C) (Oral)   Resp 18   Ht 5\' 3"  (1.6 m)   Wt (!) 176.4 kg   LMP 02/21/2019 (Exact Date)   SpO2 98%   BMI 68.91 kg/m   Physical Exam Vitals signs and nursing note reviewed.  Constitutional:      General: She is not in acute distress.    Appearance: She is well-developed.     Comments: Resting comfortably in chair  HENT:     Head: Normocephalic and atraumatic.     Comments: Sounds audibly congested.  No frontal or maxillary sinus tenderness.  Posterior pharynx without erythema, tonsillar hypertrophy, exudates, trismus, sublingual abnormalities, or uvular deviation.  Tolerating secretions without difficulty.  Airway appears patent.    Nose: Congestion present.  Eyes:     General:         Right eye: No discharge.        Left eye: No discharge.     Conjunctiva/sclera: Conjunctivae normal.  Neck:     Musculoskeletal: Normal range of motion and neck supple.     Vascular: No JVD.     Trachea: No tracheal deviation.  Cardiovascular:     Rate and Rhythm: Normal rate and regular rhythm.     Pulses: Normal pulses.  Pulmonary:     Effort: Pulmonary effort is normal.     Breath sounds: Normal breath sounds.  Comments: Speaking in full sentences without difficulty, SPO2 saturations 98% on room air Chest:     Chest wall: No tenderness.  Abdominal:     General: Bowel sounds are normal. There is no distension.     Palpations: Abdomen is soft.  Musculoskeletal:     Right lower leg: She exhibits no tenderness.     Left lower leg: She exhibits no tenderness. No edema.  Skin:    General: Skin is warm and dry.     Findings: No erythema.  Neurological:     Mental Status: She is alert.  Psychiatric:        Behavior: Behavior normal.      ED Treatments / Results  Labs (all labs ordered are listed, but only abnormal results are displayed) Labs Reviewed  SARS CORONAVIRUS 2 (TAT 6-24 HRS)    EKG None  Radiology Dg Chest Portable 1 View  Result Date: 03/04/2019 CLINICAL DATA:  Shortness of breath EXAM: PORTABLE CHEST 1 VIEW COMPARISON:  Chest radiographs, CT chest 02/21/2019 FINDINGS: The heart size and mediastinal contours are within normal limits. Redemonstrated irregular consolidations of the upper lobes, better assessed by prior CT. No new airspace opacity. The visualized skeletal structures are unremarkable. IMPRESSION: Redemonstrated irregular consolidations of the upper lobes, better assessed by prior CT and as seen on multiple prior chest radiographs. No new airspace opacity. Electronically Signed   By: Eddie Candle M.D.   On: 03/04/2019 21:23    Procedures Procedures (including critical care time)  Medications Ordered in ED Medications  albuterol (VENTOLIN  HFA) 108 (90 Base) MCG/ACT inhaler 1 puff (1 puff Inhalation Given 03/04/19 2218)     Initial Impression / Assessment and Plan / ED Course  I have reviewed the triage vital signs and the nursing notes.  Pertinent labs & imaging results that were available during my care of the patient were reviewed by me and considered in my medical decision making (see chart for details).        ALITZEL SIEVER was evaluated in Emergency Department on 03/05/2019 for the symptoms described in the history of present illness. She was evaluated in the context of the global COVID-19 pandemic, which necessitated consideration that the patient might be at risk for infection with the SARS-CoV-2 virus that causes COVID-19. Institutional protocols and algorithms that pertain to the evaluation of patients at risk for COVID-19 are in a state of rapid change based on information released by regulatory bodies including the CDC and federal and state organizations. These policies and algorithms were followed during the patient's care in the ED.  Patient presenting for evaluation of symptoms consistent with viral URI.  She is afebrile, vital signs are stable.  She is nontoxic in appearance.  She recently established care with pulmonology but was exposed to similar symptoms from her toddler.  Chest x-ray shows no acute abnormalities, no new consolidations, though she has persistent irregular consolidations of the upper lobes as seen on prior imaging.  She was ambulated in the ED with stable SPO2 saturations, no hypoxia.  No evidence of respiratory distress on my assessment.  Airway appears patent.  No evidence of peritonsillar abscess, strep pharyngitis, meningitis, or deep space neck infection.  Discussed symptomatic management.  Advised of appropriate use of albuterol and Afrin.  Will test for Covid, discussed recommendations for quarantining at home per current CDC guidelines.  Recommend follow-up with pulmonology or her PCP on an  outpatient basis.  Discussed strict ED return precautions. Patient verbalized understanding  of and agreement with plan and is safe for discharge home at this time.   Final Clinical Impressions(s) / ED Diagnoses   Final diagnoses:  SOB (shortness of breath)  Nasal congestion  Cough    ED Discharge Orders         Ordered    oxymetazoline (AFRIN NASAL SPRAY) 0.05 % nasal spray  2 times daily     03/04/19 2231           Renita Papa, PA-C 03/05/19 0004    Isla Pence, MD 03/07/19 [phone]

## 2019-03-04 NOTE — ED Triage Notes (Signed)
Arrived by POV from home with c/o SHOB, dry mouth, and difficulty swallowing because "mouth is so dry". Patient reports she has been taking Zyrtec, Delsym, and Prednisone and medication for acid reflux. NAD

## 2019-03-05 ENCOUNTER — Telehealth: Payer: Self-pay | Admitting: Internal Medicine

## 2019-03-05 DIAGNOSIS — R918 Other nonspecific abnormal finding of lung field: Secondary | ICD-10-CM

## 2019-03-05 LAB — SARS CORONAVIRUS 2 (TAT 6-24 HRS): SARS Coronavirus 2: NEGATIVE

## 2019-03-05 NOTE — Telephone Encounter (Signed)
No additional recs so should keep her prior appt - if worse in meantime ov with all active meds in hand to regroup

## 2019-03-05 NOTE — Telephone Encounter (Signed)
Attempted to call patient, no answer, left message to call back.  

## 2019-03-05 NOTE — Telephone Encounter (Signed)
Spoke with the pt  She states developed a cold from her 38 y/o right after last ov  She woke up with a lot of PND that she felt was choking her  Called after hours and spoke with MR and he rec antihistamine  She tried zyrtec and states her breathing got bad and "felt like suffocating" so went to ED  Covid neg  She was given albuterol and afrin  Feeling much better but now starting to produce some white to yellow sputum and asking for MW's recs  Please advise thanks

## 2019-03-06 NOTE — Telephone Encounter (Signed)
Dr. Melvyn Novas, the patient called back and stated she is feeling better than when she was when seen in office 03/01/19.   However, she has since noticed in the last couple says that she will cough more in the morning when she gets up, but not able to get the phlegm out, rather it will come up to her throat and goes back down. Also states when she lays down it feels like something is moving around her in chest. Not sure if that is normal or not.  Has been using the medications as directed during last visit. And also noticed she has been having excess gas and belching. Wanted to know if all of these things are normal based on the medication prescribed.  I did ask if she wanted to come in to be seen sooner, but she stated that she would wait for a response and also to see if this issue resolves. I told her if she does not improve or worsens by Thursday to call for an appointment.  Please advise if you agree and if there is anything in particular she needs to be looking out for. Thank you much.

## 2019-03-06 NOTE — Telephone Encounter (Signed)
Left message for patient to call back  

## 2019-03-06 NOTE — Telephone Encounter (Signed)
Pt returning call

## 2019-03-06 NOTE — Telephone Encounter (Signed)
ATC x 2- line rings and then turns to fast busy signal

## 2019-03-06 NOTE — Telephone Encounter (Signed)
Can try mucienx dm 1200 mg every 12 h prn cough   Also should stop pantoprazole and just take the pepcid 20 mg bid after meals and see if the gas resolves

## 2019-03-07 ENCOUNTER — Telehealth: Payer: Self-pay | Admitting: Internal Medicine

## 2019-03-07 MED ORDER — FAMOTIDINE 20 MG PO TABS: 20.0000 mg | ORAL_TABLET | Freq: Two times a day (BID) | ORAL | 11 refills | Status: DC

## 2019-03-07 NOTE — Telephone Encounter (Signed)
Spoke with pt. She is aware of MW's response. Rx has been sent in for Pepcid per the pt's request. Nothing further was needed.

## 2019-03-07 NOTE — Telephone Encounter (Signed)
LMTCB

## 2019-03-07 NOTE — Telephone Encounter (Signed)
Spoke with the pt  She needed clarification on pepcid 20 mg  I advised to take it 20 mg 1 after breakfast and 1 after supper  She verbalized understanding and nothing further needed

## 2019-03-07 NOTE — Telephone Encounter (Signed)
Returning call CB# [phone]

## 2019-03-09 ENCOUNTER — Encounter: Payer: Self-pay | Admitting: Gastroenterology

## 2019-03-12 ENCOUNTER — Ambulatory Visit: Payer: Medicaid Other | Admitting: Internal Medicine

## 2019-03-20 ENCOUNTER — Encounter: Payer: Self-pay | Admitting: Gastroenterology

## 2019-03-20 ENCOUNTER — Ambulatory Visit (INDEPENDENT_AMBULATORY_CARE_PROVIDER_SITE_OTHER): Payer: Medicaid Other | Admitting: Gastroenterology

## 2019-03-20 VITALS — BP 123/82 | HR 73 | Temp 97.0°F | Ht 63.0 in | Wt 393.0 lb

## 2019-03-20 DIAGNOSIS — K219 Gastro-esophageal reflux disease without esophagitis: Secondary | ICD-10-CM | POA: Diagnosis not present

## 2019-03-20 NOTE — Patient Instructions (Addendum)
Pantoprazole 40mg  twice daily - take 1 tablet 30-4mins before breakfast and dinner.   Pepcid 40mg  - take at bedtime.  If you are age 37 or older, your body mass index should be between 23-30. Your Body mass index is 69.62 kg/m. If this is out of the aforementioned range listed, please consider follow up with your Primary Care Provider.  Please keep follow-up appt in 4 weeks.   Thank you for choosing me and [facility].  Janett Billow Zehr-PA

## 2019-03-20 NOTE — Progress Notes (Addendum)
03/20/2019 Diana Blair [id number] 1980-05-16   HISTORY OF PRESENT ILLNESS: This is a very pleasant 38 year old female who was previously patient of Dr. Kelby Fam.  She says that she actually had an endoscopy and colonoscopy with Dr. Deatra Ina probably in about 2005 or so.  Those records must be in our old system so we are going to try to find those for my review.  Anyways, she is here today with complaints of acid reflux.  She has been seeing pulmonary recently this month for possible diagnosis of sarcoidosis.  There is also been mention of possibly some aspiration type of issue.  She does admit to heartburn and reflux sensation with bitter taste in her mouth, sensation of needing to clear her throat, somewhat of a globus sensation, etc.  She was on pantoprazole that was just prescribed this month by pulmonary, but then it was changed to Pepcid instead.  She tells me that she is aware that if she lost weight that this would help acid reflux issues.  She says she is trying to work on it.  She says she has been heavy all of her life and does not eat unhealthily, but really just eats one larger meal in the evening.  She says she has had this bad eating habits with just eating 1 meal daily like that since she was a child.  No dysphagia.  Referred by her PCP, Percell Belt, PA-C.  Past Medical History:  Diagnosis Date  . 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 Diverticulitis   . Heart palpitations   . Morbid obesity with BMI of 50.0-59.9, adult (HCC)    BMI 58.8  . PCOS (polycystic ovarian syndrome)   . Seizure [facility])    as a child   Past Surgical History:  Procedure Laterality Date  . CERVICAL CERCLAGE  03/13/2012   Procedure: CERCLAGE CERVICAL;  Surgeon: Jonnie Kind, MD;  Location: [place] [facility];  Service: Gynecology;  Laterality: N/A;  . CERVICAL CERCLAGE  03/17/2012   Procedure: CERCLAGE CERVICAL;  Surgeon: Osborne Oman, MD;  Location: [place] [facility];  Service:  Gynecology;  Laterality: N/A;  Removal    reports that she quit smoking about 10 years ago. Her smoking use included cigarettes. She has a 9.00 pack-year smoking history. She has never used smokeless tobacco. She reports that she does not drink alcohol or use drugs. family history includes Cancer in her mother; Diabetes in her father and paternal grandmother; Heart failure in her father; Hypertension in her brother, mother, and sister; Rashes / Skin problems in her sister. No Known Allergies    Outpatient Encounter Medications as of 03/20/2019  Medication Sig  . pantoprazole (PROTONIX) 40 MG tablet Take 1 tablet (40 mg total) by mouth daily. Take 30-60 min before first meal of the day  . [DISCONTINUED] albuterol (PROVENTIL HFA;VENTOLIN HFA) 108 (90 Base) MCG/ACT inhaler Inhale 1-2 puffs into the lungs every 4 (four) hours as needed for wheezing or shortness of breath (or cough).  . [DISCONTINUED] famotidine (PEPCID) 20 MG tablet Take 1 tablet (20 mg total) by mouth 2 (two) times daily. (Patient not taking: Reported on 03/20/2019)  . [DISCONTINUED] predniSONE (DELTASONE) 10 MG tablet Take  4 each am x 2 days,   2 each am x 2 days,  1 each am x 2 days and stop (Patient not taking: Reported on 03/20/2019)   No facility-administered encounter medications on file as of 03/20/2019.      REVIEW  OF SYSTEMS  : All other systems reviewed and negative except where noted in the History of Present Illness.   PHYSICAL EXAM: Temp (!) 97 F (36.1 C)   Ht 5\' 3"  (1.6 m)   Wt (!) 393 lb (178.3 kg)   LMP 02/21/2019 (Exact Date)   BMI 69.62 kg/m  General: Well developed black female in no acute distress Head: Normocephalic and atraumatic Eyes:  Sclerae anicteric, conjunctiva pink. Ears: Normal auditory acuity Lungs: Clear throughout to auscultation; no increased WOB. Heart: Regular rate and rhythm; no M/R/G. Abdomen: Soft, obese, non-distended.  BS present.  Non-tender. Musculoskeletal: Symmetrical  with no gross deformities  Skin: No lesions on visible extremities Extremities: No edema  Neurological: Alert oriented x 4, grossly non-focal Psychological:  Alert and cooperative. Normal mood and affect  ASSESSMENT AND PLAN: *GERD: We will maximize acid reflux regimen for now.  I am going to place her on pantoprazole 40 mg twice daily and Pepcid 40 mg at bedtime.  We discussed that weight loss would help the situation as well, which she was aware of and says that she is working on.  Also advised to try to eat small frequent meals throughout the day instead of one bigger meal in the evening, which she tends to do.  She says that she does not eat unhealthily, she is just had bad eating habits as far as just eating 1 large meal in the evenings for her entire life.  She has had endoscopy and colonoscopy in the past by Dr. Deatra Ina.  Those records are in our old system so I am going to try to obtain those for review.  I am to see her back in 4 weeks.  **Addendum: Colonoscopy February 2006 showed diverticulosis.  EGD from December 2005 showed a normal exam.  Results are being sent for scanning.   CC:  Church, Audrie Gallus, [place]

## 2019-03-21 NOTE — Progress Notes (Signed)
I agree with the above note, plan.  BMI near 70 probably contributes to her GI symptoms.

## 2019-03-26 ENCOUNTER — Ambulatory Visit: Payer: Medicaid Other | Admitting: Obstetrics & Gynecology

## 2019-03-30 ENCOUNTER — Other Ambulatory Visit: Payer: Self-pay | Admitting: Internal Medicine

## 2019-03-30 DIAGNOSIS — R918 Other nonspecific abnormal finding of lung field: Secondary | ICD-10-CM

## 2019-04-02 ENCOUNTER — Ambulatory Visit: Payer: Medicaid Other | Admitting: Internal Medicine

## 2019-04-04 ENCOUNTER — Other Ambulatory Visit: Payer: Self-pay

## 2019-04-04 ENCOUNTER — Encounter (HOSPITAL_COMMUNITY): Payer: Self-pay | Admitting: Emergency Medicine

## 2019-04-04 ENCOUNTER — Ambulatory Visit (HOSPITAL_COMMUNITY)
Admission: EM | Admit: 2019-04-04 | Discharge: 2019-04-04 | Disposition: A | Discharge status: Home / Self Care | Payer: Medicaid Other | Attending: Family Medicine | Admitting: Family Medicine

## 2019-04-04 DIAGNOSIS — Z20822 Contact with and (suspected) exposure to covid-19: Secondary | ICD-10-CM

## 2019-04-04 DIAGNOSIS — Z20828 Contact with and (suspected) exposure to other viral communicable diseases: Secondary | ICD-10-CM | POA: Diagnosis not present

## 2019-04-04 NOTE — ED Provider Notes (Signed)
[place]    CSN: [id number] Arrival date & time: 04/04/19  1638      History   Chief Complaint Chief Complaint  Patient presents with  . URI    HPI Diana Blair is a 38 y.o. female.   Diana Blair presents with requests for covid testing. Her daughter had a teacher at day care who tested positive therefore she is concerned about exposure. She and her daughter feel well and are without symptoms. Her daughter was last around the teacher 1 week ago. States she is currently getting worked up for sarcoidosis. History  Of pcos, obesity, reflux.    ROS per HPI, negative if not otherwise mentioned.      Past Medical History:  Diagnosis Date  . 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 Diverticulitis   . Heart palpitations   . Morbid obesity with BMI of 50.0-59.9, adult (HCC)    BMI 58.8  . PCOS (polycystic ovarian syndrome)   . Seizure [facility])    as a child    Patient Active Problem List   Diagnosis Date Noted  . Gastroesophageal reflux disease 03/20/2019  . Pulmonary infiltrates on CXR 02/26/2019  . Upper airway cough syndrome 02/26/2019  . Advanced maternal age in multigravida 11/10/2016  . Chronic hypertension in pregnancy 11/10/2016  . IUGR (intrauterine growth restriction) affecting care of mother, third trimester, other fetus 11/05/2016  . BMI 60.0-69.9, adult ([place]) 05/04/2016  . History of preterm delivery 08/20/2015  . History of cervical cerclage 08/20/2015  . PCOS (polycystic ovarian syndrome) 02/08/2013    Past Surgical History:  Procedure Laterality Date  . CERVICAL CERCLAGE  03/13/2012   Procedure: CERCLAGE CERVICAL;  Surgeon: Jonnie Kind, MD;  Location: [place] [facility];  Service: Gynecology;  Laterality: N/A;  . CERVICAL CERCLAGE  03/17/2012   Procedure: CERCLAGE CERVICAL;  Surgeon: Osborne Oman, MD;  Location: [place] [facility];  Service: Gynecology;  Laterality: N/A;  Removal    OB History    Gravida  2   Para  2   Term  1   Preterm  1   AB  0   Living  1     SAB  0   TAB  0   Ectopic  0   Multiple  0   Live Births  2            Home Medications    Prior to Admission medications   Medication Sig Start Date End Date Taking? Authorizing Provider  pantoprazole (PROTONIX) 40 MG tablet Take 1 tablet (40 mg total) by mouth daily. Take 30-60 min before first meal of the day 02/26/19   Tanda Rockers, MD  albuterol (PROVENTIL HFA;VENTOLIN HFA) 108 (90 Base) MCG/ACT inhaler Inhale 1-2 puffs into the lungs every 4 (four) hours as needed for wheezing or shortness of breath (or cough). 04/01/18 03/20/19  Street, Lane, PA-C    Family History Family History  Problem Relation Age of Onset  . Cancer Mother        lung  . Hypertension Mother   . Diabetes Father   . Heart failure Father   . Diabetes Paternal Grandmother   . Hypertension Sister   . Hypertension Brother   . Rashes / Skin problems Sister   . Other Neg Hx     Social History Social History   Tobacco Use  . Smoking status: Former Smoker    Packs/day: 1.50    Years: 6.00  Pack years: 9.00    Types: Cigarettes    Quit date: 02/08/2009    Years since quitting: 10.1  . Smokeless tobacco: Never Used  Substance Use Topics  . Alcohol use: No  . Drug use: No     Allergies   Patient has no known allergies.   Review of Systems Review of Systems   Physical Exam Triage Vital Signs ED Triage Vitals  Enc Vitals Group     BP 04/04/19 1713 127/90     Pulse Rate 04/04/19 1713 81     Resp 04/04/19 1713 16     Temp 04/04/19 1713 98.6 F (37 C)     Temp Source 04/04/19 1713 Oral     SpO2 04/04/19 1713 96 %     Weight --      Height --      Head Circumference --      Peak Flow --      Pain Score 04/04/19 1714 0     Pain Loc --      Pain Edu? --      Excl. in [place]? --    No data found.  Updated Vital Signs BP 127/90 (BP Location: Left Arm)   Pulse 81   Temp 98.6 F (37 C) (Oral)   Resp 16    LMP 03/28/2019   SpO2 96%   Visual Acuity Right Eye Distance:   Left Eye Distance:   Bilateral Distance:    Right Eye Near:   Left Eye Near:    Bilateral Near:     Physical Exam Constitutional:      General: She is not in acute distress.    Appearance: She is well-developed.  Cardiovascular:     Rate and Rhythm: Normal rate.  Pulmonary:     Effort: Pulmonary effort is normal.  Skin:    General: Skin is warm and dry.  Neurological:     Mental Status: She is alert and oriented to person, place, and time.      UC Treatments / Results  Labs (all labs ordered are listed, but only abnormal results are displayed) Labs Reviewed  NOVEL CORONAVIRUS, NAA (HOSP ORDER, SEND-OUT TO REF LAB; TAT 18-24 HRS)    EKG   Radiology No results found.  Procedures Procedures (including critical care time)  Medications Ordered in UC Medications - No data to display  Initial Impression / Assessment and Plan / UC Course  I have reviewed the triage vital signs and the nursing notes.  Pertinent labs & imaging results that were available during my care of the patient were reviewed by me and considered in my medical decision making (see chart for details).     Non toxic. Afebrile. No acute complaints today. Concern for covid-19 exposure. Testing collected and pending. Isolation discussed. Return precautions provided. Patient verbalized understanding and agreeable to plan.  Ambulatory out of clinic without difficulty.    Final Clinical Impressions(s) / UC Diagnoses   Final diagnoses:  Exposure to COVID-19 virus  Encounter for laboratory testing for COVID-19 virus     Discharge Instructions     Self isolate until covid results are back and negative.  Will notify you by phone of any positive findings. Your negative results will be sent through your MyChart.     It is possible that this testing is too early and false negative, please isolate if you develop any symptoms of Covid-19    ED Prescriptions    None     PDMP  not reviewed this encounter.   Zigmund Gottron, NP 04/04/19 1756

## 2019-04-04 NOTE — ED Triage Notes (Signed)
Pt here to get tested for COVID.Marland Kitchen. reports daughter's teacher tested positive for COVID  Pt is asymptomatic.... A&O x4... no acute distress.

## 2019-04-04 NOTE — Discharge Instructions (Addendum)
Self isolate until covid results are back and negative.  Will notify you by phone of any positive findings. Your negative results will be sent through your MyChart.     It is possible that this testing is too early and false negative, please isolate if you develop any symptoms of Covid-19

## 2019-04-06 LAB — NOVEL CORONAVIRUS, NAA (HOSP ORDER, SEND-OUT TO REF LAB; TAT 18-24 HRS): SARS-CoV-2, NAA: NOT DETECTED

## 2019-04-18 ENCOUNTER — Ambulatory Visit: Payer: Medicaid Other | Admitting: Gastroenterology

## 2019-05-02 ENCOUNTER — Ambulatory Visit: Payer: Medicaid Other | Admitting: Obstetrics and Gynecology

## 2019-05-02 ENCOUNTER — Ambulatory Visit: Payer: Medicaid Other | Admitting: Medical

## 2019-08-01 ENCOUNTER — Ambulatory Visit: Payer: Medicaid Other | Attending: Internal Medicine

## 2019-08-02 ENCOUNTER — Telehealth: Payer: Self-pay | Admitting: Primary Care

## 2019-08-02 ENCOUNTER — Encounter: Payer: Self-pay | Admitting: Primary Care

## 2019-08-02 NOTE — Telephone Encounter (Signed)
No she has not been around or traveled.  Called patient let her know the appt would be in office tomorrow with BW 08/02/19

## 2019-08-02 NOTE — Telephone Encounter (Signed)
If she has had no know sick contact and no fever. Has anyone tested positive for covid or is current being tested for covid?  Has she travelled outside the state in the last 2 weeks? If no to these questions she can come into office.

## 2019-08-02 NOTE — Telephone Encounter (Signed)
Pt states cold 2 weeks ago.  Residual congestion and cough (but cough is also sarcoid related).  Denies fever.  [place] for [employer]?

## 2019-08-02 NOTE — Telephone Encounter (Signed)
Spoke with patient She states he is being seen for a cough.  She was having cough, sneezing, runny nose 2 weeks ago. She states she has been taking generic dayquil and was feeling better.  Now she is starting to feel like when she came to see Dr. Melvyn Novas.  Patient still having cough, and congestion.  Beth please advise if this patient can come into office or needs to reschedule, patient does not want a telephone or video visit.

## 2019-08-03 ENCOUNTER — Ambulatory Visit (INDEPENDENT_AMBULATORY_CARE_PROVIDER_SITE_OTHER)
Admission: RE | Admit: 2019-08-03 | Discharge: 2019-08-03 | Disposition: A | Discharge status: Home / Self Care | Payer: Medicaid Other | Source: Ambulatory Visit | Attending: Primary Care | Admitting: Primary Care

## 2019-08-03 ENCOUNTER — Ambulatory Visit: Payer: Medicaid Other | Admitting: Primary Care

## 2019-08-03 ENCOUNTER — Encounter: Payer: Self-pay | Admitting: Primary Care

## 2019-08-03 ENCOUNTER — Other Ambulatory Visit: Payer: Self-pay

## 2019-08-03 VITALS — BP 132/68 | HR 100 | Temp 98.7°F | Ht 63.0 in | Wt 386.0 lb

## 2019-08-03 DIAGNOSIS — R05 Cough: Secondary | ICD-10-CM

## 2019-08-03 DIAGNOSIS — R918 Other nonspecific abnormal finding of lung field: Secondary | ICD-10-CM | POA: Diagnosis not present

## 2019-08-03 DIAGNOSIS — R0602 Shortness of breath: Secondary | ICD-10-CM

## 2019-08-03 DIAGNOSIS — K219 Gastro-esophageal reflux disease without esophagitis: Secondary | ICD-10-CM | POA: Diagnosis not present

## 2019-08-03 DIAGNOSIS — R059 Cough, unspecified: Secondary | ICD-10-CM

## 2019-08-03 DIAGNOSIS — R1310 Dysphagia, unspecified: Secondary | ICD-10-CM

## 2019-08-03 NOTE — Progress Notes (Signed)
 @Patient ID: Diana Blair, female    DOB: 10/12/1980, 39 y.o.   MRN: [id number]  Chief Complaint  Patient presents with  . Follow-up    Referring provider: No ref. provider found   Brief patient profile:  39 yobf  Quit smoking 2010  Last IUP 2018  ? Allergy as  child and  And dx as teenage as freq "bronchitis" and pattern continued as adult  up to as often as once  yearly and no change p quit smoking esp severe pattern since 2019  assoc with nasal congestion and similar onset flare   Aug 2020   failure to respond to 3 different abx for "pna/ bronchitis" so referred to pulmonary clinic 02/26/2019 by Dr   Messner.  HPI: 39-year-old female, former smoker.  Past medical history significant for GERD, pulmonary infiltrates on chest x-ray, PCOS, obesity, upper airway cough syndrome.  Patient of Dr. Wert, last seen on 03/01/19.   08/03/2019 Patient presents today for follow-up visit for cough. Reports recurrence of upper airway cough. Reports that her cough was 75-80% better after prednisone course . Her cough returned three weeks ago, feels her cough is from catching a cold from her daughter. States that she is prescribed Protonix twice daily and famotidine at bedtime. She is about 75% compliant with this medication.  States that at times when she is eating food will get stuck in her throat and she will need to drink something. She was experiencing some chocking symptoms at night but this has improved. Denies fever, chills, N/V/D, chest pain, shortness of breath.   Persistent esp RUL since 02/11/2018  - CT chest 02/21/2019- Patchy bilateral airspace opacities, some of which appear masslike. These most likely reflect multifocal pneumonia. Atypical pneumonia is possible. Recommend follow-up after treatment to ensure resolution.  - My impression:  Greatest area is RUL apically. Rec short course pred while waiting for labs and f/u in 2 weeks.  - Labs:  02/26/2019 = ESR 70, ACE 98,Quant Gold TB  neg  -  Studies most consistent with sarcoid, treated with prednisone  - If symptoms recur on GERD RX/diet next step FOB with transbronchial bx RUL apical segment    No Known Allergies  Immunization History  Administered Date(s) Administered  . Influenza Split 05/24/2016  . Tdap 05/24/2016    Past Medical History:  Diagnosis Date  . 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 Diverticulitis   . Heart palpitations   . Morbid obesity with BMI of 50.0-59.9, adult (HCC)    BMI 58.8  . PCOS (polycystic ovarian syndrome)   . Seizure (HCC)    as a child    Tobacco History: Social History   Tobacco Use  Smoking Status Former Smoker  . Packs/day: 1.50  . Years: 6.00  . Pack years: 9.00  . Types: Cigarettes  . Quit date: 02/08/2009  . Years since quitting: 10.4  Smokeless Tobacco Never Used   Counseling given: Not Answered   Outpatient Medications Prior to Visit  Medication Sig Dispense Refill  . famotidine (PEPCID) 10 MG tablet Take 20 mg by mouth daily.    . Meloxicam 15 MG TBDP Take 15 mg by mouth daily as needed.    . pantoprazole (PROTONIX) 40 MG tablet Take 1 tablet (40 mg total) by mouth daily. Take 30-60 min before first meal of the day 30 tablet 2   No facility-administered medications prior to visit.      Review of Systems    Review of Systems  Constitutional: Negative.   Respiratory: Positive for cough and choking. Negative for chest tightness, shortness of breath and wheezing.   Cardiovascular: Negative.   Gastrointestinal:       GERD    Physical Exam  BP 132/68 (BP Location: Right Arm, Cuff Size: Large)   Pulse 100   Temp 98.7 F (37.1 C) (Temporal)   Ht 5' 3" (1.6 m)   Wt (!) 386 lb (175.1 kg)   SpO2 97% Comment: RA  BMI 68.38 kg/m  Physical Exam Constitutional:      Appearance: Normal appearance. She is obese.  HENT:     Head: Normocephalic and atraumatic.     Mouth/Throat:     Mouth: Mucous membranes are moist.      Pharynx: Oropharynx is clear.  Cardiovascular:     Rate and Rhythm: Normal rate and regular rhythm.  Pulmonary:     Effort: Pulmonary effort is normal.     Breath sounds: Normal breath sounds.  Musculoskeletal:        General: Normal range of motion.  Skin:    General: Skin is warm.  Neurological:     General: No focal deficit present.     Mental Status: She is alert. Mental status is at baseline.  Psychiatric:        Mood and Affect: Mood normal.        Thought Content: Thought content normal.        Judgment: Judgment normal.      Lab Results:  CBC    Component Value Date/Time   WBC 6.5 02/26/2019 1440   RBC 4.71 02/26/2019 1440   HGB 12.0 02/26/2019 1440   HGB 12.9 02/08/2017 1524   HCT 37.6 02/26/2019 1440   HCT 38.0 02/08/2017 1524   PLT 275.0 02/26/2019 1440   PLT 253 02/08/2017 1524   MCV 80.0 02/26/2019 1440   MCV 82 02/08/2017 1524   MCH 25.7 (L) 02/21/2019 1145   MCHC 31.9 02/26/2019 1440   RDW 16.9 (H) 02/26/2019 1440   RDW 16.3 (H) 02/08/2017 1524   LYMPHSABS 0.8 02/26/2019 1440   MONOABS 0.8 02/26/2019 1440   EOSABS 0.3 02/26/2019 1440   BASOSABS 0.0 02/26/2019 1440    BMET    Component Value Date/Time   NA 138 02/21/2019 1145   NA 137 11/02/2016 1533   K 4.0 02/21/2019 1145   CL 104 02/21/2019 1145   CO2 26 02/21/2019 1145   GLUCOSE 106 (H) 02/21/2019 1145   BUN 13 02/21/2019 1145   BUN 5 (L) 11/02/2016 1533   CREATININE 0.94 02/21/2019 1145   CREATININE 0.57 03/08/2012 1203   CALCIUM 9.1 02/21/2019 1145   GFRNONAA >60 02/21/2019 1145   GFRAA >60 02/21/2019 1145    BNP    Component Value Date/Time   BNP 13.6 02/21/2019 1145    ProBNP No results found for: PROBNP  Imaging: DG Chest 2 View  Result Date: 08/04/2019 CLINICAL DATA:  Shortness of breath. EXAM: CHEST - 2 VIEW COMPARISON:  Multiple chest x-rays since 2017. CT scan February 21, 2019 FINDINGS: Patchy pulmonary infiltrates have increased since first seen in 2019. The  findings do not appear to represent typical scarring on a CT scan from October 2020. The heart, hila, mediastinum, lungs, and pleura are otherwise unchanged. IMPRESSION: Slowly worsening infiltrates since 2019, not definitely scarring on the CT scan from February 21, 2019. Recommend pulmonary consultation with consideration of bronchoscopy for diagnosis. These results will be called to the  ordering clinician or representative by the Radiologist Assistant, and communication documented in the PACS or [employer]. Electronically Signed   By: Dorise Bullion III M.D   On: 08/04/2019 20:13     Assessment & Plan:   Pulmonary infiltrates on CXR - Increased cough x 3 weeks  - CXR 08/03/2019 - patchy pulmonary infiltrates have increased since first seen in 2019 - Treated for sarcoid with oral prednisone in November 2020 with improvement in cough  - Awaiting ACE level - Likely consider flexible bronchoscopy with transbronchial bx RUL, will discuss with Dr. Melvyn Novas   Gastroesophageal reflux disease - Patient 75% compliant with Protonix twice daily and famotidine at bedtime - Encourage patient to take as prescribed. Educated on GERD diet  Dysphagia - Ordered for modified barium swallow    >25 mins spent face to face with patient  Martyn Ehrich, NP 08/05/2019

## 2019-08-03 NOTE — Patient Instructions (Addendum)
Recommendation: - Make sure to take Protonix twice daily 1 hour prior or after meals - Take famotidine at bedtime - Follow GERD diet   Orders: - Modified barium swallow re: globus sensation /dysphagia - CXR today re: cough - Labs- Ace  Follow-up: - 4-6 weeks with Dr. Melvyn Novas    Food Choices for Gastroesophageal Reflux Disease, Adult When you have gastroesophageal reflux disease (GERD), the foods you eat and your eating habits are very important. Choosing the right foods can help ease your discomfort. Think about working with a nutrition specialist (dietitian) to help you make good choices. What are tips for following this plan?  Meals  Choose healthy foods that are low in fat, such as fruits, vegetables, whole grains, low-fat dairy products, and lean meat, fish, and poultry.  Eat small meals often instead of 3 large meals a day. Eat your meals slowly, and in a place where you are relaxed. Avoid bending over or lying down until 2-3 hours after eating.  Avoid eating meals 2-3 hours before bed.  Avoid drinking a lot of liquid with meals.  Cook foods using methods other than frying. Bake, grill, or broil food instead.  Avoid or limit: ? Chocolate. ? Peppermint or spearmint. ? Alcohol. ? Pepper. ? Black and decaffeinated coffee. ? Black and decaffeinated tea. ? Bubbly (carbonated) soft drinks. ? Caffeinated energy drinks and soft drinks.  Limit high-fat foods such as: ? Fatty meat or fried foods. ? Whole milk, cream, butter, or ice cream. ? Nuts and nut butters. ? Pastries, donuts, and sweets made with butter or shortening.  Avoid foods that cause symptoms. These foods may be different for everyone. Common foods that cause symptoms include: ? Tomatoes. ? Oranges, lemons, and limes. ? Peppers. ? Spicy food. ? Onions and garlic. ? Vinegar. Lifestyle  Maintain a healthy weight. Ask your doctor what weight is healthy for you. If you need to lose weight, work with your  doctor to do so safely.  Exercise for at least 30 minutes for 5 or more days each week, or as told by your doctor.  Wear loose-fitting clothes.  Do not smoke. If you need help quitting, ask your doctor.  Sleep with the head of your bed higher than your feet. Use a wedge under the mattress or blocks under the bed frame to raise the head of the bed. Summary  When you have gastroesophageal reflux disease (GERD), food and lifestyle choices are very important in easing your symptoms.  Eat small meals often instead of 3 large meals a day. Eat your meals slowly, and in a place where you are relaxed.  Limit high-fat foods such as fatty meat or fried foods.  Avoid bending over or lying down until 2-3 hours after eating.  Avoid peppermint and spearmint, caffeine, alcohol, and chocolate. This information is not intended to replace advice given to you by your health care provider. Make sure you discuss any questions you have with your health care provider. Document Revised: 08/03/2018 Document Reviewed: 05/18/2016 Elsevier Patient Education  [place].

## 2019-08-05 DIAGNOSIS — R1310 Dysphagia, unspecified: Secondary | ICD-10-CM | POA: Insufficient documentation

## 2019-08-05 LAB — ANGIOTENSIN CONVERTING ENZYME: Angiotensin-Converting Enzyme: 145 U/L — ABNORMAL HIGH (ref 9–67)

## 2019-08-05 NOTE — Assessment & Plan Note (Signed)
-   Ordered for modified barium swallow

## 2019-08-05 NOTE — Assessment & Plan Note (Signed)
-   Increased cough x 3 weeks  - CXR 08/03/2019 - patchy pulmonary infiltrates have increased since first seen in 2019 - Treated for sarcoid with oral prednisone in November 2020 with improvement in cough  - Awaiting ACE level - Likely consider flexible bronchoscopy with transbronchial bx RUL, will discuss with Dr. Melvyn Novas

## 2019-08-05 NOTE — Assessment & Plan Note (Signed)
-   Patient 75% compliant with Protonix twice daily and famotidine at bedtime - Encourage patient to take as prescribed. Educated on GERD diet

## 2019-08-06 NOTE — Progress Notes (Signed)
Please have patient follow-up with Dr. Melvyn Novas next available. CXR showed increase infiltrate and ACE level was elevated. He would like to discuss bronchoscopy with her in person.

## 2019-08-08 ENCOUNTER — Telehealth: Payer: Self-pay | Admitting: Internal Medicine

## 2019-08-08 NOTE — Telephone Encounter (Signed)
Called pt and advised message from the provider. Pt understood and verbalized understanding. Nothing further is needed.   Made an appt with MW tomorrow 08/09/2019 at 1:45pm to discuss per pt request.

## 2019-08-08 NOTE — Telephone Encounter (Signed)
Called and spoke with pt who stated at last visit she had with [facility], it was mentioned to possibly do a pred rx for 2 weeks.   Pt is not scheduled to see [facility] again until 5/14 and due to this, she wants to know tx plan based on the results of labwork and xray.  Beth, please advise if you want [place] to send rx to pharmacy for prednisone or if you might want [place] to move up pt's appt sooner.

## 2019-08-08 NOTE — Telephone Encounter (Signed)
Want to hold off on prednisone until I see her so move her up to next available  - other option is televisit add on at end of day 4/15 or 4/16

## 2019-08-08 NOTE — Telephone Encounter (Signed)
Can you send this to Dr. Melvyn Novas. He did not recommend resuming prednisone when I reviewed her CT results with him, he wanted to see her first. Does he have an earlier visit?

## 2019-08-09 ENCOUNTER — Encounter: Payer: Self-pay | Admitting: *Deleted

## 2019-08-09 ENCOUNTER — Ambulatory Visit (INDEPENDENT_AMBULATORY_CARE_PROVIDER_SITE_OTHER): Payer: Medicaid Other | Admitting: Internal Medicine

## 2019-08-09 ENCOUNTER — Other Ambulatory Visit: Payer: Self-pay

## 2019-08-09 DIAGNOSIS — R05 Cough: Secondary | ICD-10-CM | POA: Diagnosis not present

## 2019-08-09 DIAGNOSIS — R058 Other specified cough: Secondary | ICD-10-CM

## 2019-08-09 DIAGNOSIS — R918 Other nonspecific abnormal finding of lung field: Secondary | ICD-10-CM

## 2019-08-09 NOTE — Patient Instructions (Addendum)
Best way to take protonix is Take 30- 60 min before your first and last meals of the day and pepcid at bedtime   GERD (REFLUX)  is an extremely common cause of respiratory symptoms just like yours , many times with no obvious heartburn at all.    It can be treated with medication, but also with lifestyle changes including elevation of the head of your bed (ideally with 6 -8inch blocks under the headboard of your bed),  Smoking cessation, avoidance of late meals, excessive alcohol, and avoid fatty foods, chocolate, peppermint, colas, red wine, and acidic juices such as orange juice.  NO MINT OR MENTHOL PRODUCTS SO NO COUGH DROPS  USE SUGARLESS CANDY INSTEAD (Jolley ranchers or Stover's or Life Savers) or even ice chips will also do - the key is to swallow to prevent all throat clearing. NO OIL BASED VITAMINS - use powdered substitutes.  Avoid fish oil when coughing.     Please schedule a follow up office visit in 2 weeks, sooner if needed  with all medications /inhalers/ solutions in hand so we can verify exactly what you are taking. This includes all medications from all doctors and over the counters.

## 2019-08-09 NOTE — Progress Notes (Signed)
Diana Blair, female    DOB: 04/06/1981,   MRN: [id number]   Brief patient profile:  13 yobf  Quit smoking 2010  Last IUP 2018  ? Allergy as  child and  And dx as teenage as freq "bronchitis" and pattern continued as adult  up to as often as once  yearly and no change p quit smoking esp severe pattern since 2019  assoc with nasal congestion and similar onset flare   Aug 2020   failure to respond to 3 different abx for "pna/ bronchitis" so referred to pulmonary clinic 02/26/2019 by Dr   Dolly Rias.    History of Present Illness  02/26/2019  Pulmonary/ 1st office eval/Leslieann Whisman  Chief Complaint  Patient presents with  . Pulmonary Consult    Referred by Dr Rivka Barbara for eval of PNA.  Pt states that she has had PNA since Aug 2020 and is on her 3rd round of abx. She c/o SOB. She has some cough at night- prod with clear sputum.   Dyspnea:  MMRC3 = can't walk 100 yards even at a slow pace at a flat grade s stopping due to sob Cough: evenings are the worst with sensaton of globus but no excess mucus for forced cough   Sleep: flat in bed / best she feels best first thing in am and denies noct cough  SABA use: not using  No fever, no localized or pleuritic cp. Some assoc migrating arthralgias  Sensation of mucus stuck in chest  > was told this was fluid so is convinced this is correct.  rec For cough or sensation throat / chest congestion  >  delsym 2 tsp every 12 hours as needed GERD  Diet  Prednisone 10 mg take  4 each am x 2 days,   2 each am x 2 days,  1 each am x 2 days and stop  Please remember to go to the lab department   for your tests - we will call you with the results when they are available. Please schedule a follow up office visit in 2 weeks     03/01/2019  f/u ov/Tiajah Oyster re:  Cough/ sob/ pulmonary infiltrates  Chief Complaint  Patient presents with  . Follow-up    Pt is here today to discuss the results of her labwork from last visit. Pt says that she has been doing well since last  visit.  Dyspnea:  better >> Playing with toddler / less sob car to door today vs prior ov and parked further out  Cough: less sense of globus, food no longer  Feels as  Stuck/ no mucus  Sleeping: still feels good in am c am cough or noct resp symtpoms  SABA use: none 02: none  rec Change prednisone to 10 mg one daily until done  Please schedule a follow up office visit in 4 weeks, sooner if needed for CXR return    Virtual Visit via Telephone Note 08/09/2019   I connected with Diana Blair on 08/09/19 at  1:45 PM EDT by telephone and verified that I am speaking with the correct person using two identifiers.   I discussed the limitations, risks, security and privacy concerns of performing an evaluation and management service by telephone and the availability of in person appointments. I also discussed with the patient that there may be a patient responsible charge related to this service. The patient expressed understanding and agreed to proceed.   History of Present Illness: Dyspnea:  No longer  sob  Cough: sense of globus persists  Sleeping: choking in her sleep but has only happened   a few times / has programmable bed  SABA use: none  02: none  Arthritis rx meloxicam    No obvious day to day or daytime variability or assoc excess/ purulent sputum or mucus plugs or hemoptysis or cp or chest tightness, subjective wheeze or overt sinus or hb symptoms.    Also denies any obvious fluctuation of symptoms with weather or environmental changes or other aggravating or alleviating factors except as outlined above.   Meds reviewed/ med reconciliation completed        Observations/Objective: Talking in full sentences, good voice texture    I personally reviewed images and agree with radiology impression as follows:  CXR:   08/03/19 Slowly worsening infiltrates since 2019, not definitely scarring on the CT scan from February 21, 2019 My review:  The air space dz though slt more  prominent is still mostly upper lobe and not that impressive for sarcoid involvement though this is the most likely dx, may not explain any of her symptoms     Assessment and Plan: See problem list for active a/p's   Follow Up Instructions: See avs for instructions unique to this ov which includes revised/ updated med list     I discussed the assessment and treatment plan with the patient. The patient was provided an opportunity to ask questions and all were answered. The patient agreed with the plan and demonstrated an understanding of the instructions.   The patient was advised to call back or seek an in-person evaluation if the symptoms worsen or if the condition fails to improve as anticipated.  I provided 15 minutes of non-face-to-face time during this encounter.   Christinia Gully, MD           Past Medical History:  Diagnosis Date  . 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 Diverticulitis   . Heart palpitations   . Morbid obesity with BMI of 50.0-59.9, adult (HCC)    BMI 58.8  . PCOS (polycystic ovarian syndrome)   . Seizure [facility])    as a child

## 2019-08-12 ENCOUNTER — Encounter: Payer: Self-pay | Admitting: Internal Medicine

## 2019-08-12 NOTE — Assessment & Plan Note (Addendum)
Persistent esp RUL since 02/11/2018 plain cxr  - CT chest 02/21/2019 Patchy bilateral airspace opacities, some of which appear masslike. These most likely reflect multifocal pneumonia. Atypical pneumonia is possible. Recommend follow-up after treatment to ensure Resolution. My impression:  Greatest area is RUL apically  - ESR  02/26/2019 = 70 - ACE 02/26/2019 = 98  - Quant Gold TB 02/26/2019 = neg  - ACE  08/03/19   = 145  This is almost certainly chronic pulmonary sarcoid but causing minimal symptoms at present see my cxr review in today's televist)  and poor candidate for steroids at at any dose due to wt so will need to set up ov for discussion of rba and proceed with fob when feasible but hold off further emprical rx  for now.   Each maintenance medication was reviewed in detail including most importantly the difference between maintenance and as needed and under what circumstances the prns are to be used.  Please see AVS for specific  Instructions which are unique to this visit and I personally typed out  which were reviewed in detail over the phone with the patient and a copy provided by MyChart.      

## 2019-08-12 NOTE — Assessment & Plan Note (Signed)
Onset ? 2019 absent during sleep, feels best first thing in am and not reproduced with insp/exp  c/w cyclical cough - max rx for gerd 02/26/2019 > much better 03/01/2019   Some better on rx   F/u with all meds in hand using a trust but verify approach to confirm accurate Medication  Reconciliation The principal here is that until we are certain that the  patients are doing what we've asked, it makes no sense to ask them to do more.

## 2019-08-13 ENCOUNTER — Ambulatory Visit: Payer: Medicaid Other | Admitting: Internal Medicine

## 2019-08-20 ENCOUNTER — Telehealth: Payer: Self-pay | Admitting: Gastroenterology

## 2019-08-21 NOTE — Telephone Encounter (Signed)
The pt has ben experiencing continued GERD and was advised by Janett Billow at last appt 11/20 to have imaging.  The pt did not follow thru and now states she has sarcoidosis related problems and needs a follow up with Dr Ardis Hughs.  appt made and pt aware.

## 2019-08-21 NOTE — Telephone Encounter (Signed)
Left message on machine to call back  

## 2019-09-03 ENCOUNTER — Other Ambulatory Visit: Payer: Self-pay

## 2019-09-03 ENCOUNTER — Encounter: Payer: Self-pay | Admitting: Gastroenterology

## 2019-09-03 ENCOUNTER — Encounter: Payer: Self-pay | Admitting: Internal Medicine

## 2019-09-03 ENCOUNTER — Ambulatory Visit: Payer: Medicaid Other | Admitting: Gastroenterology

## 2019-09-03 ENCOUNTER — Ambulatory Visit (INDEPENDENT_AMBULATORY_CARE_PROVIDER_SITE_OTHER): Payer: Medicaid Other | Admitting: Internal Medicine

## 2019-09-03 VITALS — BP 124/86 | HR 101 | Temp 98.2°F | Ht 63.0 in | Wt 390.0 lb

## 2019-09-03 DIAGNOSIS — R058 Other specified cough: Secondary | ICD-10-CM

## 2019-09-03 DIAGNOSIS — R1310 Dysphagia, unspecified: Secondary | ICD-10-CM | POA: Diagnosis not present

## 2019-09-03 DIAGNOSIS — R05 Cough: Secondary | ICD-10-CM | POA: Diagnosis not present

## 2019-09-03 DIAGNOSIS — K219 Gastro-esophageal reflux disease without esophagitis: Secondary | ICD-10-CM

## 2019-09-03 DIAGNOSIS — R918 Other nonspecific abnormal finding of lung field: Secondary | ICD-10-CM

## 2019-09-03 DIAGNOSIS — R059 Cough, unspecified: Secondary | ICD-10-CM

## 2019-09-03 MED ORDER — PANTOPRAZOLE SODIUM 40 MG PO TBEC: DELAYED_RELEASE_TABLET | ORAL | Status: DC

## 2019-09-03 MED ORDER — PREDNISONE 10 MG PO TABS: ORAL_TABLET | ORAL | 0 refills | Status: DC

## 2019-09-03 MED ORDER — FAMOTIDINE 20 MG PO TABS: 20.0000 mg | ORAL_TABLET | Freq: Every day | ORAL | Status: DC

## 2019-09-03 NOTE — Progress Notes (Addendum)
Diana Blair, female    DOB: [birth year],   MRN: [id number]   Brief patient profile:  21 yobf  Quit smoking 2010  Last IUP 2018  ? Allergy as  child and  And dx as teenage as freq "bronchitis" and pattern continued as adult  up to as often as once  yearly and no change p quit smoking esp severe pattern since 2019  assoc with nasal congestion and similar onset flare   Aug 2020   failure to respond to 3 different abx for "pna/ bronchitis" so referred to pulmonary clinic 02/26/2019 by Dr   Dolly Rias.  Globus sensation comes and goes since 2010   November 12 2016  IUP  And w/in 6-8  Months dx " pna didn't get better"  History of Present Illness  02/26/2019  Pulmonary/ 1st office eval/Diana Blair  Chief Complaint  Patient presents with  . Pulmonary Consult    Referred by Dr Rivka Barbara for eval of PNA.  Pt states that she has had PNA since Aug 2020 and is on her 3rd round of abx. She c/o SOB. She has some cough at night- prod with clear sputum.   Dyspnea:  MMRC3 = can't walk 100 yards even at a slow pace at a flat grade s stopping due to sob Cough: evenings are the worst with sensaton of globus but no excess mucus for forced cough   Sleep: flat in bed / best she feels best first thing in am and denies noct cough  SABA use: not using  No fever, no localized or pleuritic cp. Some assoc migrating arthralgias  Sensation of mucus stuck in chest  > was told this was fluid so is convinced this is correct.  rec For cough or sensation throat / chest congestion  >  delsym 2 tsp every 12 hours as needed GERD  Diet  Prednisone 10 mg take  4 each am x 2 days,   2 each am x 2 days,  1 each am x 2 days and stop  Please remember to go to the lab department   for your tests - we will call you with the results when they are available. Please schedule a follow up office visit in 2 weeks, sooner if needed  with all medications /inhalers/ solutions in hand so we can verify exactly what you are taking. This includes all  medications from all doctors and over the counters   03/01/2019  f/u ov/Diana Blair re:  Cough/ sob/ pulmonary infiltrates  Chief Complaint  Patient presents with  . Follow-up    Pt is here today to discuss the results of her labwork from last visit. Pt says that she has been doing well since last visit.  Dyspnea:  Better  >> Playing with toddler / less sob car to door today vs prior ov and parked further out  Cough: less sense of globus, food no longer  Feels as  Stuck/ no mucus  Sleeping: still feels good in am c am cough or noct resp symtpoms  SABA use: none 02: none  Change prednisone to 10 mg one daily until done  Please schedule a follow up office visit in 4 weeks, sooner if needed for CXR return > did not do as improved from November to March    08/03/19 NP   Recommendation: - Make sure to take Protonix twice daily 1 hour prior or after meals - Take famotidine at bedtime - Follow GERD diet  Orders: - Modified barium swallow re:  globus sensation /dysphagia - CXR Slowly worsening infiltrates since 2019, not definitely scarring on the CT scan from February 21, 2019. Recommend pulmonary consultation with consideration of bronchoscopy for diagnosis. - Labs- Ace 145   08/09/19  televist rec Best way to take protonix is Take 30- 60 min before your first and last meals of the day and pepcid at bedtime  GERD diet  Please schedule a follow up office visit in 2 weeks, sooner if needed  with all medications /inhalers/ solutions in hand so we can verify exactly what you are taking. This includes all medications from all doctors and over the counters       09/03/2019  f/u ov/Diana Blair re:   Has not had covid shots or covid 19 / was much better p prednisone rx  - benefit lasted sev months including arthritis symptoms Chief Complaint  Patient presents with  . Follow-up    1 month f/u. Increased fatigue, all day.   Dyspnea: mostly with ex Cough: assoc with globus, esp with activity Sleeping:  elevating at 30 egrees SABA use: none 02: none    No obvious day to day or daytime variability or assoc excess/ purulent sputum or mucus plugs or hemoptysis or cp or chest tightness, subjective wheeze or overt   hb symptoms.   Sleeping  without nocturnal  or early am exacerbation  of respiratory  c/o's or need for noct saba. Also denies any obvious fluctuation of symptoms with weather or environmental changes or other aggravating or alleviating factors except as outlined above   No unusual exposure hx or h/o childhood pna/ asthma or knowledge of premature birth.  Current Allergies, Complete Past Medical History, Past Surgical History, Family History, and Social History were reviewed in [employer] record.  ROS  The following are not active complaints unless bolded Hoarseness, sore throat, dysphagia, dental problems, itching, sneezing,  nasal congestion or discharge of excess mucus or purulent secretions, ear ache,   fever, chills, sweats, unintended wt loss or wt gain, classically pleuritic or exertional cp,  orthopnea pnd or arm/hand swelling  or leg swelling, presyncope, palpitations, abdominal pain, anorexia, nausea, vomiting, diarrhea  or change in bowel habits or change in bladder habits, change in stools or change in urine, dysuria, hematuria,  rash, arthralgias, visual complaints, headache, numbness, weakness or ataxia or problems with walking or coordination,  change in mood or  memory.        Current Meds  Medication Sig  . famotidine (PEPCID) 20 MG tablet Take 1 tablet (20 mg total) by mouth at bedtime.  . pantoprazole (PROTONIX) 40 MG tablet Take 1 tablet shortly before first meal of the day                      Past Medical History:  Diagnosis Date  . 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 Diverticulitis   . Heart palpitations   . Morbid obesity with BMI of 50.0-59.9, adult (HCC)    BMI 58.8  . PCOS  (polycystic ovarian syndrome)   . Seizure [facility])    as a child      Objective:     Obese amb bf nad    09/03/2019            390     03/01/19 (!) 389 lb 3.2 oz (176.5 kg)  02/26/19 (!) 390 lb (176.9 kg)  02/19/19 (!) 495 lb (224.5 kg)     Vital  signs reviewed  09/03/2019  - Note at rest 02 sats  98% on RA      HEENT : pt wearing mask not removed for exam due to covid -19 concerns.    NECK :  without JVD/Nodes/TM/ nl carotid upstrokes bilaterally   LUNGS: no acc muscle use,  Nl contour chest which is clear to A and P bilaterally without cough on insp or exp maneuvers   CV:  RRR  no s3 or murmur or increase in P2, and no edema   ABD:  soft and nontender with nl inspiratory excursion in the supine position. No bruits or organomegaly appreciated, bowel sounds nl  MS:  Nl gait/ ext warm without deformities, calf tenderness, cyanosis or clubbing No obvious joint restrictions   SKIN: warm and dry without lesions    NEURO:  alert, approp, nl sensorium with  no motor or cerebellar deficits apparent.                 Assessment

## 2019-09-03 NOTE — Patient Instructions (Signed)
If you are age 38 or older, your body mass index should be between 23-30. Your Body mass index is 69.09 kg/m. If this is out of the aforementioned range listed, please consider follow up with your Primary Care Provider.  If you are age 73 or younger, your body mass index should be between 19-25. Your Body mass index is 69.09 kg/m. If this is out of the aformentioned range listed, please consider follow up with your Primary Care Provider.   You have been scheduled for a Barium Esophogram at [facility] (1st floor of the hospital) on 10-04-19 at 9:30am. Please arrive 15 minutes prior to your appointment for registration. Make certain not to have anything to eat or drink 3 hours prior to your test. If you need to reschedule for any reason, please contact radiology at [phone] to do so. ______________________________________________________ A barium swallow is an examination that concentrates on views of the esophagus. This tends to be a double contrast exam (barium and two liquids which, when combined, create a gas to distend the wall of the oesophagus) or single contrast (non-ionic iodine based). The study is usually tailored to your symptoms so a good history is essential. Attention is paid during the study to the form, structure and configuration of the esophagus, looking for functional disorders (such as aspiration, dysphagia, achalasia, motility and reflux) EXAMINATION You may be asked to change into a gown, depending on the type of swallow being performed. A radiologist and radiographer will perform the procedure. The radiologist will advise you of the type of contrast selected for your procedure and direct you during the exam. You will be asked to stand, sit or lie in several different positions and to hold a small amount of fluid in your mouth before being asked to swallow while the imaging is performed .In some instances you may be asked to swallow barium coated marshmallows to assess  the motility of a solid food bolus. The exam can be recorded as a digital or video fluoroscopy procedure. POST PROCEDURE It will take 1-2 days for the barium to pass through your system. To facilitate this, it is important, unless otherwise directed, to increase your fluids for the next 24-48hrs and to resume your normal diet.  This test typically takes about 30 minutes to perform. ______________________________________________________  Take pantoprazole 40mg  1 tablet shortly before first meal of the day  Continue pepcid 20mg  at bedtime.  You should monitor and limit caffeine use in the afternoon hours.  Due to recent changes in healthcare laws, you may see the results of your imaging and laboratory studies on MyChart before your provider has had a chance to review them.  We understand that in some cases there may be results that are confusing or concerning to you. Not all laboratory results come back in the same time frame and the provider may be waiting for multiple results in order to interpret others.  Please give [place] 48 hours in order for your provider to thoroughly review all the results before contacting the office for clarification of your results.   Thank you for entrusting me with your care and choosing [facility].  Dr Ardis Hughs

## 2019-09-03 NOTE — Patient Instructions (Addendum)
We will call with ENT referral  In meantime if getting worse Prednisone 10 mg x 2 daily until better then 1 daily x one week and stop  Please schedule a follow up office visit in 6 weeks, call sooner if needed with cxr on return

## 2019-09-03 NOTE — Progress Notes (Signed)
HPI: This is a very pleasant 39 year old woman whom I am meeting for the first time today  She had a colonoscopy February 2006 with Dr. Deatra Ina and showed diverticulosis.  She had an upper endoscopy December 2005 with Dr. Deatra Ina and it was normal.  She was last here in our office and she saw Janett Billow about 6 months ago.  Morbid obesity with a BMI in the upper 60s, GERD symptoms.  She was put on Protonix twice daily and Pepcid at bedtime.  They discussed weight loss as also pitting likely very helpful to control her GERD.  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  She takes pantoprazole 40 mg usually once a day, sometimes twice a day.  She takes it in the morning first thing after waking.  She almost never eats until the evening at which time she eats a very large meal.  She takes Pepcid at bedtime 3 to 4 days a week.  She drinks caffeine, often in the afternoon.  Today she tells me she is bothered by acid taste in her mouth, clearing her throat very often, belching, choking sometimes on saliva, positive intermittent nonprogressive solid food dysphagia as well.  Her weight has been overall stable   ROS: complete GI ROS as described in HPI, all other review negative.  Constitutional:  No unintentional weight loss   Past Medical History:  Diagnosis Date  . 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 Diverticulitis   . Heart palpitations   . Morbid obesity with BMI of 50.0-59.9, adult (HCC)    BMI 58.8  . PCOS (polycystic ovarian syndrome)   . Seizure [facility])    as a child    Past Surgical History:  Procedure Laterality Date  . CERVICAL CERCLAGE  03/13/2012   Procedure: CERCLAGE CERVICAL;  Surgeon: Jonnie Kind, MD;  Location: [place] [facility];  Service: Gynecology;  Laterality: N/A;  . CERVICAL CERCLAGE  03/17/2012   Procedure: CERCLAGE CERVICAL;  Surgeon: Osborne Oman, MD;  Location: [place] [facility];  Service: Gynecology;   Laterality: N/A;  Removal    Current Outpatient Medications  Medication Sig Dispense Refill  . famotidine (PEPCID) 20 MG tablet Take 20 mg by mouth daily.     . pantoprazole (PROTONIX) 40 MG tablet Take 40 mg by mouth 2 (two) times daily.     No current facility-administered medications for this visit.    Allergies as of 09/03/2019  . (No Known Allergies)    Family History  Problem Relation Age of Onset  . Cancer Mother        lung  . Hypertension Mother   . Diabetes Father   . Heart failure Father   . Diabetes Paternal Grandmother   . Hypertension Sister   . Hypertension Brother   . Rashes / Skin problems Sister   . Other Neg Hx     Social History   Socioeconomic History  . Marital status: Married    Spouse name: Not on file  . Number of children: Not on file  . Years of education: Not on file  . Highest education level: Not on file  Occupational History  . Not on file  Tobacco Use  . Smoking status: Former Smoker    Packs/day: 1.50    Years: 6.00    Pack years: 9.00    Types: Cigarettes    Quit date: 02/08/2009    Years since quitting: 10.5  . Smokeless tobacco: Never Used  Substance and Sexual Activity  . Alcohol use: No  . Drug use: No  . Sexual activity: Yes    Birth control/protection: None  Other Topics Concern  . Not on file  Social History Narrative  . Not on file   Social Determinants of Health   Financial Resource Strain:   . Difficulty of Paying Living Expenses:   Food Insecurity:   . Worried About Charity fundraiser in the Last Year:   . Arboriculturist in the Last Year:   Transportation Needs:   . Film/video editor (Medical):   Marland Kitchen Lack of Transportation (Non-Medical):   Physical Activity:   . Days of Exercise per Week:   . Minutes of Exercise per Session:   Stress:   . Feeling of Stress :   Social Connections:   . Frequency of Communication with Friends and Family:   . Frequency of Social Gatherings with Friends and Family:    . Attends Religious Services:   . Active Member of Clubs or Organizations:   . Attends Archivist Meetings:   Marland Kitchen Marital Status:   Intimate Partner Violence:   . Fear of Current or Ex-Partner:   . Emotionally Abused:   Marland Kitchen Physically Abused:   . Sexually Abused:      Physical Exam: Temp (!) 97.5 F (36.4 C)   Ht 5\' 3"  (1.6 m)   Wt (!) 390 lb (176.9 kg)   BMI 69.09 kg/m  Constitutional: generally well-appearing except for morbid obesity Psychiatric: alert and oriented x3 Abdomen: soft, nontender, nondistended, no obvious ascites, no peritoneal signs, normal bowel sounds No peripheral edema noted in lower extremities  Assessment and plan: 39 y.o. female with morbid obesity, GERD, intermittent dysphagia  She is not taking her proton pump inhibitor at the correct time in relation to meals and so I am having her change that.  She only eats 1 meal daily, very large, usually in the afternoon hours.  I told her she should be taking her proton pump inhibitor 20 to 30 minutes prior to that meal.  She should continue taking her Pepcid at bedtime I would like her to take it every single night instead of just 3 or 4 nights weekly.  In about 1 month time, to allow her new better antiacid regimen to start taking effect, she will have a barium esophagram given her intermittent dysphagia.  She describes a lot of throat clearing, globus type sensation, postnasal drip type sensation.  I explained to her these are very unlikely related to her GI tract.  She has a BMI of 69, losing weight will probably help a lot of her GERD symptoms.  Please see the "Patient Instructions" section for addition details about the plan.  Owens Loffler, MD [facility] 09/03/2019, 10:05 AM   Total time on date of encounter was 30 minutes (this included time spent preparing to see the patient reviewing records; obtaining and/or reviewing separately obtained history; performing a medically  appropriate exam and/or evaluation; counseling and educating the patient and family if present; ordering medications, tests or procedures if applicable; and documenting clinical information in the health record).

## 2019-09-04 ENCOUNTER — Encounter: Payer: Self-pay | Admitting: Internal Medicine

## 2019-09-04 NOTE — Assessment & Plan Note (Signed)
Onset ? 2019 absent during sleep, feels best first thing in am and not reproduced with insp/exp  c/w cyclical cough - max rx for gerd 02/26/2019 > much better 03/01/2019  - Referred to ENT 09/03/2019   Has globus and sinus complaints - latter  ? Sarcoid related > ent eval next step for both

## 2019-09-04 NOTE — Assessment & Plan Note (Signed)
Persistent esp RUL since 02/11/2018 plain cxr  - CT chest 02/21/2019 Patchy bilateral airspace opacities, some of which appear masslike. These most likely reflect multifocal pneumonia. Atypical pneumonia is possible. Recommend follow-up after treatment to ensure Resolution. My impression:  Greatest area is RUL apically  - ESR  02/26/2019 = 70 - ACE 02/26/2019 = 98  - Quant Gold TB 02/26/2019 = neg  - ACE  08/03/19   = 145   Prior response to prednisone and ACE strongly suggestive this is sarcoid and yet most of her complaints are upper airway at this point so rec ent eval of sinus /throat complaints then consider EBUS under anesthesia as high risk to try tbbx with conscious sedation given her body habitus  Discussed in detail all the  indications, usual  risks and alternatives  relative to the benefits with patient who agrees to proceed with w/u as outlined.            Each maintenance medication was reviewed in detail including emphasizing most importantly the difference between maintenance and prns and under what circumstances the prns are to be triggered using an action plan format where appropriate.  Total time for H and P, chart review, counseling,   and generating customized AVS unique to this office visit / charting = 20 min

## 2019-09-07 ENCOUNTER — Ambulatory Visit: Payer: Medicaid Other | Admitting: Internal Medicine

## 2019-09-07 DIAGNOSIS — D869 Sarcoidosis, unspecified: Secondary | ICD-10-CM | POA: Insufficient documentation

## 2019-09-13 ENCOUNTER — Telehealth: Payer: Self-pay | Admitting: Internal Medicine

## 2019-09-13 NOTE — Telephone Encounter (Signed)
Spoke with pt .She states that she had a f/u with her PCP and her Vit D level was low ( results in care everywhere) and they want her to take Vit D 50,000 iu once a week for 4 weeks then change over to Vit D 1000mg  daily. Pt has read that there could be sarcoid complications with Vit D and wanted to make sure that Dr Melvyn Novas was on board with this before she started it. They have also recommended taking Vit B12 daily. Dr Melvyn Novas please advise.

## 2019-09-14 NOTE — Telephone Encounter (Signed)
It's probably ok but what I would recommend is to check a calcium level prior to the 2nd and 4th dose of the 50 K of Vit D as some sarcoid patients can be very sensitive to it and drive the calcium too high, and don't take vitamins with calcium / calcium supplements while on the high doses of vit D  Copy to PCP

## 2019-09-14 NOTE — Telephone Encounter (Signed)
Pt returning call.  [phone].

## 2019-09-14 NOTE — Telephone Encounter (Signed)
LMTCB x1 for pt.  

## 2019-09-14 NOTE — Telephone Encounter (Signed)
Spoke with the pt and notified of recs per Dr Melvyn Novas and she verbalized understanding. Copy of this faxed to Eldridge Abrahams.

## 2019-10-03 ENCOUNTER — Other Ambulatory Visit: Payer: Self-pay

## 2019-10-03 ENCOUNTER — Ambulatory Visit (INDEPENDENT_AMBULATORY_CARE_PROVIDER_SITE_OTHER): Payer: Medicaid Other | Admitting: Obstetrics and Gynecology

## 2019-10-03 ENCOUNTER — Other Ambulatory Visit (HOSPITAL_COMMUNITY)
Admission: RE | Admit: 2019-10-03 | Discharge: 2019-10-03 | Disposition: A | Discharge status: Home / Self Care | Payer: Medicaid Other | Source: Ambulatory Visit | Attending: Obstetrics and Gynecology | Admitting: Obstetrics and Gynecology

## 2019-10-03 VITALS — BP 126/80 | HR 92

## 2019-10-03 DIAGNOSIS — N816 Rectocele: Secondary | ICD-10-CM

## 2019-10-03 DIAGNOSIS — Z124 Encounter for screening for malignant neoplasm of cervix: Secondary | ICD-10-CM | POA: Diagnosis not present

## 2019-10-03 DIAGNOSIS — N949 Unspecified condition associated with female genital organs and menstrual cycle: Secondary | ICD-10-CM

## 2019-10-03 DIAGNOSIS — Z113 Encounter for screening for infections with a predominantly sexual mode of transmission: Secondary | ICD-10-CM | POA: Insufficient documentation

## 2019-10-03 DIAGNOSIS — N3941 Urge incontinence: Secondary | ICD-10-CM | POA: Diagnosis not present

## 2019-10-03 DIAGNOSIS — Z0000 Encounter for general adult medical examination without abnormal findings: Secondary | ICD-10-CM | POA: Diagnosis not present

## 2019-10-03 DIAGNOSIS — Z01419 Encounter for gynecological examination (general) (routine) without abnormal findings: Secondary | ICD-10-CM

## 2019-10-03 DIAGNOSIS — N8110 Cystocele, unspecified: Secondary | ICD-10-CM

## 2019-10-03 MED ORDER — METRONIDAZOLE 0.75 % VA GEL: 1.0000 | Freq: Every day | VAGINAL | 1 refills | Status: DC

## 2019-10-03 NOTE — Progress Notes (Signed)
GYNECOLOGY ANNUAL PREVENTATIVE CARE ENCOUNTER NOTE  Subjective:   Diana Blair is a 39 y.o. G46P1101 female here for a annual gynecologic exam. Current complaints: urinary incontinence. Reports that as soon as she feels the urge to urinate, it starts coming out. Has a lot of anxiety about this issue. This happens almost every time she has to urinate. Currently uses depends at night and will use incontinence pads during the day. Occasionally is able to stop the urge once it starts but not always.   Also reports recurrent BV. States she has had multiple tests and sometimes positive, sometimes negative. Her symptoms remained similar whether she tested positive or negative. Had vaginal odor and itching, occasionally discharge. Started using vaginal boric acid with some improvement. Improved her symptoms about 90% but "did not go back to her original odor."  Also reports two bulges in the vagina. Has not come out of vagina, but feels the opening is much more open than before. Has had to push vaginal tissue back in to have bowel movement before.   Denies abnormal vaginal bleeding, discharge, pelvic pain, problems with intercourse or other gynecologic concerns.    Gynecologic History No LMP recorded. (Menstrual status: Irregular Periods). Contraception: abstinence Last Pap: 2017. Results were: normal Last mammogram: n/a  Obstetric History OB History  Gravida Para Term Preterm AB Living  2 2 1 1  0 1  SAB TAB Ectopic Multiple Live Births  0 0 0 0 2    # Outcome Date GA Lbr Len/2nd Weight Sex Delivery Anes PTL Lv  2 Term 11/12/16 [redacted]w[redacted]d 17:18 / 00:26 5 lb 11.9 oz (2.605 kg) F Vag-Spont EPI  LIV  1 Preterm 03/17/12 [redacted]w[redacted]d 00:41 / 00:07 11.6 oz (0.329 kg) M Vag-Spont Spinal, Other  ND    Past Medical History:  Diagnosis Date   Chronic hypertension in obstetric context in first trimester 05/04/2016   Diverticulitis    Heart palpitations    Morbid obesity with BMI of 50.0-59.9, adult  (HCC)    BMI 58.8   PCOS (polycystic ovarian syndrome)    Seizure ([place])    as a child   Past Surgical History:  Procedure Laterality Date   CERVICAL CERCLAGE  03/13/2012   Procedure: CERCLAGE CERVICAL;  Surgeon: Jonnie Kind, MD;  Location: [facility];  Service: Gynecology;  Laterality: N/A;   CERVICAL CERCLAGE  03/17/2012   Procedure: CERCLAGE CERVICAL;  Surgeon: Osborne Oman, MD;  Location: [place] [facility];  Service: Gynecology;  Laterality: N/A;  Removal    Current Outpatient Medications on File Prior to Visit  Medication Sig Dispense Refill   Cyanocobalamin (VITAMIN B-12 PO) Take by mouth.     ergocalciferol (VITAMIN D2) 1.25 MG (50000 UT) capsule 50,000 Units once a week.     famotidine (PEPCID) 20 MG tablet Take 1 tablet (20 mg total) by mouth at bedtime.     pantoprazole (PROTONIX) 40 MG tablet Take 1 tablet shortly before first meal of the day     predniSONE (DELTASONE) 10 MG tablet Take  2 each am until better then 1 daily x 7 days and stop (Patient not taking: Reported on 10/03/2019) 30 tablet 0   [DISCONTINUED] albuterol (PROVENTIL HFA;VENTOLIN HFA) 108 (90 Base) MCG/ACT inhaler Inhale 1-2 puffs into the lungs every 4 (four) hours as needed for wheezing or shortness of breath (or cough). 1 Inhaler 0   No current facility-administered medications on file prior to visit.    No Known Allergies  Social History  Socioeconomic History   Marital status: Married    Spouse name: Not on file   Number of children: Not on file   Years of education: Not on file   Highest education level: Not on file  Occupational History   Not on file  Tobacco Use   Smoking status: Former Smoker    Packs/day: 1.50    Years: 6.00    Pack years: 9.00    Types: Cigarettes    Quit date: 02/08/2009    Years since quitting: 10.6   Smokeless tobacco: Never Used  Vaping Use   Vaping Use: Never used  Substance and Sexual Activity   Alcohol use: No   Drug use: No   Sexual  activity: Yes    Birth control/protection: None  Other Topics Concern   Not on file  Social History Narrative   Not on file   Social Determinants of Health   Financial Resource Strain:    Difficulty of Paying Living Expenses:   Food Insecurity:    Worried About Charity fundraiser in the Last Year:    Arboriculturist in the Last Year:   Transportation Needs:    Film/video editor (Medical):    Lack of Transportation (Non-Medical):   Physical Activity:    Days of Exercise per Week:    Minutes of Exercise per Session:   Stress:    Feeling of Stress :   Social Connections:    Frequency of Communication with Friends and Family:    Frequency of Social Gatherings with Friends and Family:    Attends Religious Services:    Active Member of Clubs or Organizations:    Attends Music therapist:    Marital Status:   Intimate Partner Violence:    Fear of Current or Ex-Partner:    Emotionally Abused:    Physically Abused:    Sexually Abused:     Family History  Problem Relation Age of Onset   Cancer Mother        lung   Hypertension Mother    Diabetes Father    Heart failure Father    Diabetes Paternal Grandmother    Hypertension Sister    Hypertension Brother    Rashes / Skin problems Sister    Other Neg Hx    The following portions of the patient's history were reviewed and updated as appropriate: allergies, current medications, past family history, past medical history, past social history, past surgical history and problem list.  Review of Systems Pertinent items are noted in HPI.   Objective:  BP 126/80    Pulse 92  CONSTITUTIONAL: Well-developed, well-nourished female in no acute distress.  HENT:  Normocephalic, atraumatic, External right and left ear normal. Oropharynx is clear and moist EYES: Conjunctivae and EOM are normal. Pupils are equal, round, and reactive to light. No scleral icterus.  NECK: Normal range of  motion, supple, no masses.  Normal thyroid.  SKIN: Skin is warm and dry. No rash noted. Not diaphoretic. No erythema. No pallor. NEUROLOGIC: Alert and oriented to person, place, and time. Normal reflexes, muscle tone coordination. No cranial nerve deficit noted. PSYCHIATRIC: Normal mood and affect. Normal behavior. Normal judgment and thought content. CARDIOVASCULAR: Normal heart rate noted, regular rhythm RESPIRATORY: Effort and breath sounds normal, no problems with respiration noted. BREASTS: deferred ABDOMEN: Soft,no distention noted.  No tenderness, rebound or guarding.  PELVIC: Normal appearing external genitalia; normal appearing vaginal mucosa and cervix.  No abnormal discharge noted.  Pap smear obtained. Anterior and posterior vaginal wall prolapse with valsalva. Normal uterine size, no other palpable masses, no uterine or adnexal tenderness. MUSCULOSKELETAL: Normal range of motion. No tenderness.  No cyanosis, clubbing, or edema. 2+ distal pulses.  Exam done with chaperone present.  Assessment and Plan:   1. Well woman exam - bPatient has not had COVID vaccine. Reviewed risks/benefits and gave info. She will consider and call with questions. - HgB A1c  2. Routine screening for STI (sexually transmitted infection) - Cervicovaginal ancillary only  3. Cervical cancer screening - Cytology - PAP( [place])  4. Urge incontinence of urine - start with decreasing food/drink known to cause irritation to bladder - Ambulatory referral to Urogynecology  5. Vaginal discomfort - feels like she has recurrent BV, to start metrogel and come in for self swab whenever she has symptoms  6. Posterior vaginal wall prolapse Referral to Urogyn  7. Prolapse of anterior vaginal wall Referral to Urogyn   Will follow up results of pap smear/STI screen and manage accordingly. Encouraged improvement in diet and exercise.    Routine preventative health maintenance measures  emphasized. Please refer to After Visit Summary for other counseling recommendations.   Total face-to-face time with patient: 30 minutes. Over 50% of encounter was spent on counseling and coordination of care.   Feliz Beam, M.D. Attending Center for [employer] [facility] farm manager)

## 2019-10-03 NOTE — Progress Notes (Signed)
Pt reports recurrent BV vaginal infections. States she experiences a vaginal odor and discomfort, but when tested all results are negative. Currently has vaginal discomfort. Began boric acid suppositories last year. Reports starting probiotic last month. Reports ongoing urinary incontinence for a few years. Pt states she feels "like something has prolapsed, when I had a bowel movement I had to push something back in." Pt states "I always have a bulge at the top."  [employer] 10/03/19

## 2019-10-03 NOTE — Patient Instructions (Signed)
Take the Metrogel for 2 weeks. Call and make appt for self-swab next time you have BV symptoms.  Cut out bladder irritants. Referral to Urogynecology for urinary issues and vaginal bulge.    Certain foods can irritate your bladder. This can give you the sensation of not emptying well or having sudden, significant urges to go to the bathroom. You can eliminate these foods and see if this improves your symptoms. Try to avoid alcohol, caffeine, citrus and spicy foods.

## 2019-10-04 ENCOUNTER — Ambulatory Visit (HOSPITAL_COMMUNITY)
Admission: RE | Admit: 2019-10-04 | Discharge: 2019-10-04 | Disposition: A | Discharge status: Home / Self Care | Payer: Medicaid Other | Source: Ambulatory Visit | Attending: Gastroenterology | Admitting: Gastroenterology

## 2019-10-04 ENCOUNTER — Ambulatory Visit (HOSPITAL_COMMUNITY): Payer: Medicaid Other

## 2019-10-04 ENCOUNTER — Telehealth: Payer: Self-pay

## 2019-10-04 DIAGNOSIS — K219 Gastro-esophageal reflux disease without esophagitis: Secondary | ICD-10-CM | POA: Insufficient documentation

## 2019-10-04 DIAGNOSIS — R1310 Dysphagia, unspecified: Secondary | ICD-10-CM

## 2019-10-04 LAB — HEMOGLOBIN A1C
Est. average glucose Bld gHb Est-mCnc: 123 mg/dL
Hgb A1c MFr Bld: 5.9 % — ABNORMAL HIGH (ref 4.8–5.6)

## 2019-10-04 NOTE — Telephone Encounter (Signed)
Referral to Dr. [place] Pink scheduled for 12/19/19 @ 1115.  Pt notified of appt.  Pt stated that she received information about appt via MyChart.  Pt verbalized understanding with no further questions.   Mel Almond, RN  10/04/19

## 2019-10-05 LAB — CERVICOVAGINAL ANCILLARY ONLY
Bacterial Vaginitis (gardnerella): NEGATIVE
Candida Glabrata: NEGATIVE
Candida Vaginitis: NEGATIVE
Chlamydia: NEGATIVE
Comment: NEGATIVE
Comment: NEGATIVE
Comment: NEGATIVE
Comment: NEGATIVE
Comment: NEGATIVE
Comment: NORMAL
Neisseria Gonorrhea: NEGATIVE
Trichomonas: NEGATIVE

## 2019-10-08 LAB — CYTOLOGY - PAP
Comment: NEGATIVE
Diagnosis: NEGATIVE
High risk HPV: NEGATIVE

## 2019-10-16 ENCOUNTER — Telehealth: Payer: Self-pay | Admitting: Internal Medicine

## 2019-10-16 MED ORDER — PANTOPRAZOLE SODIUM 40 MG PO TBEC: 40.0000 mg | DELAYED_RELEASE_TABLET | Freq: Every day | ORAL | 5 refills | Status: DC

## 2019-10-16 MED ORDER — PANTOPRAZOLE SODIUM 40 MG PO TBEC: DELAYED_RELEASE_TABLET | ORAL | 5 refills | Status: DC

## 2019-10-16 NOTE — Telephone Encounter (Signed)
Patient contacted, refill for pantoprazole sent to preferred pharmacy.

## 2019-11-26 ENCOUNTER — Ambulatory Visit (INDEPENDENT_AMBULATORY_CARE_PROVIDER_SITE_OTHER): Payer: Medicaid Other

## 2019-11-26 ENCOUNTER — Telehealth: Payer: Self-pay | Admitting: Internal Medicine

## 2019-11-26 ENCOUNTER — Other Ambulatory Visit: Payer: Self-pay

## 2019-11-26 ENCOUNTER — Ambulatory Visit (INDEPENDENT_AMBULATORY_CARE_PROVIDER_SITE_OTHER): Payer: Medicaid Other | Admitting: Internal Medicine

## 2019-11-26 ENCOUNTER — Encounter: Payer: Self-pay | Admitting: Internal Medicine

## 2019-11-26 VITALS — BP 130/70 | HR 85 | Temp 99.5°F | Ht 63.0 in | Wt 399.8 lb

## 2019-11-26 DIAGNOSIS — Z6841 Body Mass Index (BMI) 40.0 and over, adult: Secondary | ICD-10-CM

## 2019-11-26 DIAGNOSIS — R918 Other nonspecific abnormal finding of lung field: Secondary | ICD-10-CM | POA: Diagnosis not present

## 2019-11-26 DIAGNOSIS — R058 Other specified cough: Secondary | ICD-10-CM

## 2019-11-26 DIAGNOSIS — R05 Cough: Secondary | ICD-10-CM | POA: Diagnosis not present

## 2019-11-26 NOTE — Telephone Encounter (Signed)
Called and had to leave a VM.

## 2019-11-26 NOTE — Patient Instructions (Addendum)
Please remember to go to the lab and x-ray department   for your tests - we will call you with the results when they are available.   Go ahead and take the prednisone you have if you have a flare of respiratory symptoms

## 2019-11-26 NOTE — Telephone Encounter (Signed)
Will decide p review records,labs and cxr

## 2019-11-26 NOTE — Telephone Encounter (Signed)
Patient Instructions by Tanda Rockers, MD at 11/26/2019 3:30 PM Author: Tanda Rockers, MD Author Type: Physician Filed: 11/26/2019 4:23 PM  Note Status: Addendum Cosign: Cosign Not Required Encounter Date: 11/26/2019  Editor: Tanda Rockers, MD (Physician)      Prior Versions: 1. Tanda Rockers, MD (Physician) at 11/26/2019 4:21 PM - Addendum   2. Tanda Rockers, MD (Physician) at 11/26/2019 4:17 PM - Signed      Please remember to go to the lab and x-ray department   for your tests - we will call you with the results when they are available.   Go ahead and take the prednisone you have if you have a flare of respiratory symptoms      Dr. Melvyn Novas, please advise when you were wanting pt to follow back up.

## 2019-11-26 NOTE — Progress Notes (Signed)
Diana Blair, female    DOB: Jan 25, 1981    MRN: [id number]   Brief patient profile:  63   yobf  Quit smoking 2010  Last IUP 2018  ? Allergy as  Child (does not recall details or response to clariton but "eventually stopped taking it s flare and never took shots)  and  And dx as teenage as freq "bronchitis" and pattern continued as adult  up to as often as once  yearly and no change p quit smoking. Has esp severe pattern since 2019  assoc with nasal congestion and similar onset flare   Aug 2020   failure to respond to 3 different abx for "pna/ bronchitis" so referred to pulmonary clinic 02/26/2019 by Dr   Dolly Rias.  Globus sensation comes and goes since 2010 worse with exp to smells   November 12 2016  IUP  And w/in 6-8  Months dx " pna didn't get better"  History of Present Illness  02/26/2019  Pulmonary/ 1st office eval/Evelisse Szalkowski  Chief Complaint  Patient presents with  . Pulmonary Consult    Referred by Dr Rivka Barbara for eval of PNA.  Pt states that she has had PNA since Aug 2020 and is on her 3rd round of abx. She c/o SOB. She has some cough at night- prod with clear sputum.   Dyspnea:  MMRC3 = can't walk 100 yards even at a slow pace at a flat grade s stopping due to sob Cough: evenings are the worst with sensaton of globus but no excess mucus   Sleep: flat in bed / best she feels best first thing in am and denies noct cough  SABA use: not using  No fever, no localized or pleuritic cp. Some assoc migrating arthralgias  Sensation of mucus stuck in chest  > was told this was fluid so is convinced this is correct.  rec For cough or sensation throat / chest congestion  >  delsym 2 tsp every 12 hours as needed GERD  Diet  Prednisone 10 mg take  4 each am x 2 days,   2 each am x 2 days,  1 each am x 2 days and stop  Please remember to go to the lab department   for your tests - we will call you with the results when they are available. Please schedule a follow up office visit in 2 weeks, sooner if  needed  with all medications /inhalers/ solutions in hand so we can verify exactly what you are taking. This includes all medications from all doctors and over the counters   03/01/2019  f/u ov/Alison Kubicki re:  Cough/ sob/ pulmonary infiltrates  Chief Complaint  Patient presents with  . Follow-up    Pt is here today to discuss the results of her labwork from last visit. Pt says that she has been doing well since last visit.  Dyspnea:  Better  >> Playing with toddler / less sob car to door today vs prior ov and parked further out  Cough: less sense of globus, food no longer  Feels as  Stuck/ no mucus  Sleeping: still feels good in am s am cough or noct resp symtpoms  SABA use: none 02: none  Change prednisone to 10 mg one daily until done  Please schedule a follow up office visit in 4 weeks, sooner if needed for CXR return > did not do as improved from November to March    08/03/19 NP   Recommendation: - Make sure to  take Protonix twice daily 1 hour prior or after meals - Take famotidine at bedtime - Follow GERD diet  Orders: - Modified barium swallow re: globus sensation /dysphagia> done 10/04/19 nl  - CXR Slowly worsening infiltrates since 2019, not definitely scarring on the CT scan from February 21, 2019. Recommend pulmonary consultation with consideration of bronchoscopy for diagnosis. - Labs- Ace 145   08/09/19  televist rec Best way to take protonix is Take 30- 60 min before your first and last meals of the day and pepcid at bedtime  GERD diet  Please schedule a follow up office visit in 2 weeks, sooner if needed  with all medications /inhalers/ solutions in hand so we can verify exactly what you are taking. This includes all medications from all doctors and over the counters      09/03/2019  f/u ov/Makynna Manocchio re:   Has not had covid shots or covid 19 / was much better p prednisone rx  - benefit lasted sev months including arthritis symptoms Chief Complaint  Patient presents with  .  Follow-up    1 month f/u. Increased fatigue, all day.   Dyspnea: mostly with ex Cough: assoc with globus, esp with activity Sleeping: elevating at 30 egrees SABA use: none 02: none  rec We will call with ENT referral In meantime if getting worse Prednisone 10 mg x 2 daily until better then 1 daily x one week and stop Please schedule a follow up office visit in 6 weeks, call sooner if needed with cxr on return    11/26/2019  f/u ov/Levonia Wolfley re:  ? Sarcoid/ has not had covid vaccinations Chief Complaint  Patient presents with  . Follow-up    Dyspena on exertion. Airway is irriated by allergies.  Dyspnea:  No change doe - all other symptoms come and go s pattern, lasting hours to days then resolve for weeks s specific rx  Cough: none  Sleeping: mostly on L side down  SABA use: none  02: none  Cp= L posterior below scapula = when it comes on is constant x sev x per times month and last all day and no change with deep breathing or walking    No obvious day to day or daytime variability or assoc excess/ purulent sputum or mucus plugs or hemoptysis or cp or chest tightness, subjective wheeze or overt sinus or hb symptoms.   Sleeping without nocturnal  or early am exacerbation  of respiratory  c/o's or need for noct saba. Also denies any obvious fluctuation of symptoms with weather or environmental changes or other aggravating or alleviating factors except as outlined above   No unusual exposure hx or h/o childhood pna/ asthma or knowledge of premature birth.  Current Allergies, Complete Past Medical History, Past Surgical History, Family History, and Social History were reviewed in [employer] record.  ROS  The following are not active complaints unless bolded Hoarseness, sore throat, dysphagia, dental problems, itching, sneezing,  nasal congestion or discharge of excess mucus or purulent secretions, ear ache,   fever, chills, sweats, unintended wt loss or wt gain,  classically pleuritic or exertional cp,  orthopnea pnd or arm/hand swelling  or leg swelling, presyncope, palpitations, abdominal pain, anorexia, nausea, vomiting, diarrhea  or change in bowel habits or change in bladder habits, change in stools or change in urine, dysuria, hematuria,  rash, arthralgias, visual complaints, headache, numbness, weakness comes and goes  or ataxia or problems with walking or coordination,  change in mood  or  memory.        Current Meds  Medication Sig  . Cyanocobalamin (VITAMIN B-12 PO) Take by mouth.  . ergocalciferol (VITAMIN D2) 1.25 MG (50000 UT) capsule 50,000 Units once a week.  . famotidine (PEPCID) 20 MG tablet Take 1 tablet (20 mg total) by mouth at bedtime.  . metroNIDAZOLE (METROGEL) 0.75 % vaginal gel Place 1 Applicatorful vaginally at bedtime. Apply one applicatorful to vagina at bedtime for 14 days  . pantoprazole (PROTONIX) 40 MG tablet Take 1 tablet (40 mg total) by mouth daily.  . predniSONE (DELTASONE) 10 MG tablet Take  2 each am until better then 1 daily x 7 days and stop                            Past Medical History:  Diagnosis Date  . 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 Diverticulitis   . Heart palpitations   . Morbid obesity with BMI of 50.0-59.9, adult (HCC)    BMI 58.8  . PCOS (polycystic ovarian syndrome)   . Seizure [facility])    as a child      Objective:       11/26/2019             399  09/03/2019            390     03/01/19 (!) 389 lb 3.2 oz (176.5 kg)  02/26/19 (!) 390 lb (176.9 kg)  02/19/19 (!) 495 lb (224.5 kg)      amb obese bf nad   Vital signs reviewed  11/26/2019  - Note at rest 02 sats  99% on RA      HEENT : pt wearing mask not removed for exam due to covid -19 concerns.    NECK :  without JVD/Nodes/TM/ nl carotid upstrokes bilaterally   LUNGS: no acc muscle use,  Nl contour chest which is clear to A and P bilaterally without cough on insp or exp  maneuvers   CV:  RRR  no s3 or murmur or increase in P2, and no edema   ABD: massively obese/ soft and nontender with nl inspiratory excursion in the supine position. No bruits or organomegaly appreciated, bowel sounds nl  MS:  Nl gait/ ext warm without deformities, calf tenderness, cyanosis or clubbing No obvious joint restrictions   SKIN: warm and dry without lesions    NEURO:  alert, approp, nl sensorium with  no motor or cerebellar deficits apparent.      CXR PA and Lateral:   11/26/2019 :    I personally reviewed images and agree with radiology impression as follows:    No significant interval change. Patchy consolidations and ground-glass opacities in the upper lobes bilaterally, similar pattern on chest CT from October 2020   Labs ordered/ reviewed:      Chemistry      Component Value Date/Time   NA 138 02/21/2019 1145   NA 137 11/02/2016 1533   K 4.0 02/21/2019 1145   CL 104 02/21/2019 1145   CO2 26 02/21/2019 1145   BUN 13 02/21/2019 1145   BUN 5 (L) 11/02/2016 1533   CREATININE 0.94 02/21/2019 1145   CREATININE 0.57 03/08/2012 1203      Component Value Date/Time   CALCIUM 9.1 02/21/2019 1145   ALKPHOS 114 11/26/2019 1622   AST 32 11/26/2019 1622   ALT 29 11/26/2019 1622   BILITOT 0.2  11/26/2019 1622   BILITOT <0.2 11/02/2016 1533        Lab Results  Component Value Date   WBC 5.7 11/26/2019   HGB 12.4 11/26/2019   HCT 38.3 11/26/2019   MCV 80.0 11/26/2019   PLT 257.0 11/26/2019              Lab Results  Component Value Date   ESRSEDRATE 70 (H) 11/26/2019   ESRSEDRATE 70 (H) 02/26/2019            Assessment

## 2019-11-27 LAB — HEPATIC FUNCTION PANEL
ALT: 29 U/L (ref 0–35)
AST: 32 U/L (ref 0–37)
Albumin: 4 g/dL (ref 3.5–5.2)
Alkaline Phosphatase: 114 U/L (ref 39–117)
Bilirubin, Direct: 0 mg/dL (ref 0.0–0.3)
Total Bilirubin: 0.2 mg/dL (ref 0.2–1.2)
Total Protein: 8 g/dL (ref 6.0–8.3)

## 2019-11-27 LAB — CBC WITH DIFFERENTIAL/PLATELET
Basophils Absolute: 0 10*3/uL (ref 0.0–0.1)
Basophils Relative: 0.7 % (ref 0.0–3.0)
Eosinophils Absolute: 0.2 10*3/uL (ref 0.0–0.7)
Eosinophils Relative: 3.9 % (ref 0.0–5.0)
HCT: 38.3 % (ref 36.0–46.0)
Hemoglobin: 12.4 g/dL (ref 12.0–15.0)
Lymphocytes Relative: 16.8 % (ref 12.0–46.0)
Lymphs Abs: 1 10*3/uL (ref 0.7–4.0)
MCHC: 32.3 g/dL (ref 30.0–36.0)
MCV: 80 fl (ref 78.0–100.0)
Monocytes Absolute: 0.7 10*3/uL (ref 0.1–1.0)
Monocytes Relative: 13 % — ABNORMAL HIGH (ref 3.0–12.0)
Neutro Abs: 3.7 10*3/uL (ref 1.4–7.7)
Neutrophils Relative %: 65.6 % (ref 43.0–77.0)
Platelets: 257 10*3/uL (ref 150.0–400.0)
RBC: 4.78 Mil/uL (ref 3.87–5.11)
RDW: 17.8 % — ABNORMAL HIGH (ref 11.5–15.5)
WBC: 5.7 10*3/uL (ref 4.0–10.5)

## 2019-11-27 LAB — SEDIMENTATION RATE: Sed Rate: 70 mm/hr — ABNORMAL HIGH (ref 0–20)

## 2019-11-27 NOTE — Progress Notes (Signed)
Called and spoke with patient about xray results per Dr Melvyn Novas. All questions answered. Patient expressed understanding and was wondering if follow up appointment should be scheduled. Will check with Dr Melvyn Novas as to what he would like to do.

## 2019-11-27 NOTE — Assessment & Plan Note (Addendum)
Onset ? 2019 absent during sleep, feels best first thing in am and not reproduced with insp/exp  c/w cyclical cough - max rx for gerd 02/26/2019 > much better 03/01/2019  - Referred to ENT 09/03/2019  > Dr Redmond Baseman > seen 09/07/19 nl exam (no scope) - Sinus CT 09/08/19 1. No evidence of significant inflammatory sinus disease. Patent  sinus drainage pathways. 2. Mild right middle and inferior nasal turbinate hypertrophy. 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    Encouraged to return to ent and continue ppi ac/h2hs / diet in meantime

## 2019-11-27 NOTE — Telephone Encounter (Signed)
LMTCB x2 for pt 

## 2019-11-28 LAB — ANGIOTENSIN CONVERTING ENZYME: Angiotensin-Converting Enzyme: 112 U/L — ABNORMAL HIGH (ref 9–67)

## 2019-11-28 NOTE — Telephone Encounter (Signed)
LMTCB x3 for pt. We have attempted to contact pt several times with no success or call back from pt. Per triage protocol, message will be closed.   

## 2019-11-29 ENCOUNTER — Encounter: Payer: Self-pay | Admitting: Internal Medicine

## 2019-11-29 NOTE — Assessment & Plan Note (Signed)
Body mass index is 70.82 kg/m.  -  trending up  Lab Results  Component Value Date   TSH 0.91 05/04/2016     Contributing to gerd risk/ doe/reviewed the need and the process to achieve and maintain neg calorie balance > defer f/u primary care including intermittently monitoring thyroid status            Each maintenance medication was reviewed in detail including emphasizing most importantly the difference between maintenance and prns and under what circumstances the prns are to be triggered using an action plan format where appropriate.  Total time for H and P, chart review, counseling, teaching device and generating customized AVS unique to this office visit / charting = 32 min

## 2019-11-29 NOTE — Assessment & Plan Note (Signed)
Persistent esp RUL since 02/11/2018 plain cxr / no assoc adenopathy  - CT chest 02/21/2019 Patchy bilateral airspace opacities, some of which appear masslike. These most likely reflect multifocal pneumonia. Atypical pneumonia is possible. Recommend follow-up after treatment to ensure Resolution. My impression:  Greatest area is RUL apically  - ESR  02/26/2019 = 70 - ACE 02/26/2019 = 98  - Quant Gold TB 02/26/2019 = neg  - ACE  08/03/19   = 145  - ACE  11/26/2019  =  112  Persistent new bilateral upper lobe as dz R >> L ? Etiology with very little evidence of active symptoms does favor sarcoid despite lack of adenopathy and ideally needs bx esp prior to any rx but even if we had a tissue dx I would certainly not favor steroids (wt ) or mtx (bone marrow/ lung dz) treatment at this point but rather follow serially.  She also has not been vaccinated for covid 19 which is a much much bigger risk to her immediate health than anything going on with her upper lobes/ advised   Will refer to Dr Windell Norfolk for a second opinion and feasibilty of need for gen anesthesia for procedure ? Also whether navigational bx needed.

## 2019-11-29 NOTE — Telephone Encounter (Signed)
Patient returning call.  Looks like you were trying to reach her regarding her labs.  I just hung up with her if you would like to try to reach her.  Thank you.

## 2019-11-30 NOTE — Telephone Encounter (Signed)
Diana Blair has the info she needs but it can wait until Monday Aug 9 as I believe she is off today

## 2019-12-04 NOTE — Progress Notes (Signed)
I made the pt aware of the results and recommendations as stated by Dr. Melvyn Novas. She verbalized understanding. She wanted to discuss this further with Dr Melvyn Novas. I had to place a recall for Dr Valeta Harms b/c he is booked through Sept and schedule for Oct not yet open. She wants Dr Melvyn Novas to please try calling her again.

## 2019-12-05 ENCOUNTER — Other Ambulatory Visit: Payer: Self-pay

## 2019-12-05 ENCOUNTER — Encounter: Payer: Self-pay | Admitting: Cardiology

## 2019-12-05 ENCOUNTER — Ambulatory Visit: Payer: Medicaid Other | Admitting: Cardiology

## 2019-12-05 VITALS — BP 140/71 | HR 92 | Resp 17 | Ht 63.0 in | Wt >= 6400 oz

## 2019-12-05 DIAGNOSIS — R0609 Other forms of dyspnea: Secondary | ICD-10-CM

## 2019-12-05 DIAGNOSIS — Z87891 Personal history of nicotine dependence: Secondary | ICD-10-CM

## 2019-12-05 DIAGNOSIS — R002 Palpitations: Secondary | ICD-10-CM

## 2019-12-05 NOTE — Progress Notes (Signed)
Date:  12/05/2019   ID:  Diana Blair, DOB June 13, 1980, MRN [id number]  PCP:  Berkley Harvey, NP  Cardiologist:  Rex Kras, DO, [facility] (established care 12/05/2019) Former Cardiology Providers: Dr. Ellyn Hack  REASON FOR CONSULT: Shortness of breath and Morbid Obesity.   REQUESTING PHYSICIAN:  Berkley Harvey, NP [place],  [place] [address]  Chief Complaint  Patient presents with  . Palpitations  . Shortness of Breath  . Obesity  . New Patient (Initial Visit)    HPI  Diana Blair is a 39 y.o. female who presents to the office with a chief complaint of "palpitation and shortness of breath." She is referred to the office at the request of Berkley Harvey, NP. Patient's past medical history and cardiovascular risk factors include: gestational hypertension, morbid obesity.  Patient is referred to the office at the request of her primary care provider for evaluation of shortness of breath, palpitations, morbid obesity.  Patient states that she has been struggling with morbid obesity for some time.  She plans to see nutritional/weight loss clinic for possible bariatric surgery.  But prior to being considered for bariatric surgery and her underlying shortness of breath she wants to be evaluated from cardiovascular standpoint.  Shortness of breath: Patient states that she is currently working with pulmonary for work-up of sarcoidosis.  She has not had a biopsy to prove this as of yet.  She has been on steroids in the past and her shortness of breath has improved.  But over the last 1 year she is noted to have shortness of breath is getting progressively worse.  She denies orthopnea, paroxysmal nocturnal dyspnea or lower extremity swelling.  She used to have heartburn symptoms which have improved with pharmacological therapy.  She has not been evaluated for obstructive sleep apnea.  She denies chest pain at rest or with effort related activities.  Patient states that  she has been having palpitations for years now.  In the past she was evaluated by Dr. Ellyn Hack who recommended to monitor but at that time she did not have insurance to afford it.  He states that the palpitations or fluttering-like sensation in the chest usually occurs couple times a week, symptoms last for a few minutes.  They improved with resting or slowly breathing and get worse with effort related activities.  Denies prior history of coronary artery disease, myocardial infarction, congestive heart failure, deep venous thrombosis, pulmonary embolism, stroke, transient ischemic attack.  FUNCTIONAL STATUS: No structured exercise program or daily routine.   ALLERGIES: No Known Allergies  MEDICATION LIST PRIOR TO VISIT: Current Meds  Medication Sig  . Cyanocobalamin (VITAMIN B-12 PO) Take by mouth.  . ergocalciferol (VITAMIN D2) 1.25 MG (50000 UT) capsule 50,000 Units once a week.  . famotidine (PEPCID) 20 MG tablet Take 1 tablet (20 mg total) by mouth at bedtime.  . metroNIDAZOLE (METROGEL) 0.75 % vaginal gel Place 1 Applicatorful vaginally at bedtime. Apply one applicatorful to vagina at bedtime for 14 days  . pantoprazole (PROTONIX) 40 MG tablet Take 1 tablet (40 mg total) by mouth daily.  . predniSONE (DELTASONE) 10 MG tablet Take  2 each am until better then 1 daily x 7 days and stop     PAST MEDICAL HISTORY: Past Medical History:  Diagnosis Date  . 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 Diverticulitis   . Heart palpitations   . Morbid obesity with BMI of  50.0-59.9, adult ([place])    BMI 58.8  . PCOS (polycystic ovarian syndrome)   . Seizure [facility])    as a child    PAST SURGICAL HISTORY: Past Surgical History:  Procedure Laterality Date  . CERVICAL CERCLAGE  03/13/2012   Procedure: CERCLAGE CERVICAL;  Surgeon: Jonnie Kind, MD;  Location: [facility];  Service: Gynecology;  Laterality: N/A;  . CERVICAL CERCLAGE  03/17/2012   Procedure: CERCLAGE  CERVICAL;  Surgeon: Osborne Oman, MD;  Location: [facility];  Service: Gynecology;  Laterality: N/A;  Removal    FAMILY HISTORY: The patient family history includes Cancer in her mother; Diabetes in her father and paternal grandmother; Heart failure in her father; Hypertension in her brother, mother, and sister; Rashes / Skin problems in her sister.  SOCIAL HISTORY:  The patient  reports that she quit smoking about 10 years ago. Her smoking use included cigarettes. She has a 9.00 pack-year smoking history. She has never used smokeless tobacco. She reports that she does not drink alcohol and does not use drugs.  REVIEW OF SYSTEMS: Review of Systems  Constitutional: Negative for chills and fever.  HENT: Negative for hoarse voice and nosebleeds.   Eyes: Negative for discharge, double vision and pain.  Cardiovascular: Positive for dyspnea on exertion. Negative for chest pain, claudication, leg swelling, near-syncope, orthopnea, palpitations, paroxysmal nocturnal dyspnea and syncope.  Respiratory: Positive for shortness of breath. Negative for hemoptysis.   Musculoskeletal: Negative for muscle cramps and myalgias.  Gastrointestinal: Positive for heartburn. Negative for abdominal pain, constipation, diarrhea, hematemesis, hematochezia, melena, nausea and vomiting.  Genitourinary: Positive for bladder incontinence.  Neurological: Negative for dizziness and light-headedness.    PHYSICAL EXAM: Vitals with BMI 12/05/2019 11/26/2019 10/03/2019  Height 5\' 3"  5\' 3"  -  Weight 400 lbs 399 lbs 13 oz -  BMI 60.10 93.23 -  Systolic 557 322 025  Diastolic 71 70 80  Pulse 92 85 92    CONSTITUTIONAL: Well-developed and well-nourished. No acute distress.  SKIN: Skin is warm and dry. No rash noted. No cyanosis. No pallor. No jaundice HEAD: Normocephalic and atraumatic.  EYES: No scleral icterus MOUTH/THROAT: Moist oral membranes.  NECK: No JVD present. No thyromegaly noted. No carotid bruits  LYMPHATIC: No  visible cervical adenopathy.  CHEST Normal respiratory effort. No intercostal retractions  LUNGS: Clear to auscultation in the upper lung fields.  Decreased breath sounds at the bases.  No stridor. No wheezes. No rales.  CARDIOVASCULAR: Regular, positive S1-S2, no obvious murmurs rubs or gallops appreciated. ABDOMINAL: Obese, soft, nontender, nondistended quadrants.  No apparent ascites.  EXTREMITIES: No peripheral edema  HEMATOLOGIC: No significant bruising NEUROLOGIC: Oriented to person, place, and time. Nonfocal. Normal muscle tone.  PSYCHIATRIC: Normal mood and affect. Normal behavior. Cooperative  CARDIAC DATABASE: EKG: 12/05/2019: Sinus bradycardia, 59 bpm, poor R wave progression, ST-T changes inferior leads III and avF, without underlying injury pattern.  Echocardiogram: None  Stress Testing:  None  Heart Catheterization: None  LABORATORY DATA: CBC Latest Ref Rng & Units 11/26/2019 02/26/2019 02/21/2019  WBC 4.0 - 10.5 K/uL 5.7 6.5 5.4  Hemoglobin 12.0 - 15.0 g/dL 12.4 12.0 12.6  Hematocrit 36 - 46 % 38.3 37.6 41.0  Platelets 150 - 400 K/uL 257.0 275.0 284    CMP Latest Ref Rng & Units 11/26/2019 02/21/2019 07/05/2018  Glucose 70 - 99 mg/dL - 106(H) 120(H)  BUN 6 - 20 mg/dL - 13 15  Creatinine 0.44 - 1.00 mg/dL - 0.94 0.94  Sodium 135 -  145 mmol/L - 138 138  Potassium 3.5 - 5.1 mmol/L - 4.0 3.7  Chloride 98 - 111 mmol/L - 104 106  CO2 22 - 32 mmol/L - 26 23  Calcium 8.9 - 10.3 mg/dL - 9.1 8.9  Total Protein 6.0 - 8.3 g/dL 8.0 - -  Total Bilirubin 0.2 - 1.2 mg/dL 0.2 - -  Alkaline Phos 39 - 117 U/L 114 - -  AST 0 - 37 U/L 32 - -  ALT 0 - 35 U/L 29 - -    Lipid Panel  No results found for: CHOL, TRIG, HDL, CHOLHDL, VLDL, LDLCALC, LDLDIRECT, LABVLDL  No components found for: NTPROBNP No results for input(s): PROBNP in the last 8760 hours. No results for input(s): TSH in the last 8760 hours.  BMP Recent Labs    02/21/19 1145  NA 138  K 4.0  CL 104  CO2 26    GLUCOSE 106*  BUN 13  CREATININE 0.94  CALCIUM 9.1  GFRNONAA >60  GFRAA >60    HEMOGLOBIN A1C Lab Results  Component Value Date   HGBA1C 5.9 (H) 10/03/2019   MPG 123 (H) 04/08/2014    IMPRESSION:    ICD-10-CM   1. Dyspnea on exertion  R06.00 ECHOCARDIOGRAM COMPLETE    PCV MYOCARDIAL PERFUSION WITH LEXISCAN  2. Palpitations  R00.2 EKG 12-Lead    LONG TERM MONITOR (3-14 DAYS)  3. Former smoker  Z87.891   4. Class 3 severe obesity due to excess calories without serious comorbidity with body mass index (BMI) greater than or equal to 70 in adult [facility])  E66.01    Z68.45      RECOMMENDATIONS: KONNER WARRIOR is a 39 y.o. female whose past medical history and cardiac risk factors include: gestational hypertension, morbid obesity.  Dyspnea on exertion:  Patient states that she understands that majority of shortness of breath could be due to underlying morbid obesity and deconditioning.  Patient is noted that the symptoms are getting progressively worse.  She is currently under the care of pulmonary and working diagnosis is sarcoidosis.  Patient states that the biopsy is currently pending.  Given EKG changes and chronic comorbidities discussed undergoing echocardiogram and stress test.  Echocardiogram will be ordered to evaluate for structural heart disease and left ventricular systolic function.  Nuclear stress test recommended to evaluate for reversible ischemia, 2-day protocol.  Will reach out to [place] as there may be weight limitation for the SPECT tables.   And cardiovascular standpoint patient is recommended to follow-up with her PCP regarding screening for diabetes and lipids. She verbalizes understanding.   Patient is asked to discuss getting a sleep medicine evaluation with her PCP for possible OSA.   Palpitations: 14 day extended holter monitor. Monitor for now.   Former smoker: Educated on the importance of continued smoking  cessation.  FINAL MEDICATION LIST END OF ENCOUNTER: No orders of the defined types were placed in this encounter.   There are no discontinued medications.   Current Outpatient Medications:  .  Cyanocobalamin (VITAMIN B-12 PO), Take by mouth., Disp: , Rfl:  .  ergocalciferol (VITAMIN D2) 1.25 MG (50000 UT) capsule, 50,000 Units once a week., Disp: , Rfl:  .  famotidine (PEPCID) 20 MG tablet, Take 1 tablet (20 mg total) by mouth at bedtime., Disp: , Rfl:  .  metroNIDAZOLE (METROGEL) 0.75 % vaginal gel, Place 1 Applicatorful vaginally at bedtime. Apply one applicatorful to vagina at bedtime for 14 days, Disp:  70 g, Rfl: 1 .  pantoprazole (PROTONIX) 40 MG tablet, Take 1 tablet (40 mg total) by mouth daily., Disp: 90 tablet, Rfl: 5 .  predniSONE (DELTASONE) 10 MG tablet, Take  2 each am until better then 1 daily x 7 days and stop, Disp: 30 tablet, Rfl: 0  Orders Placed This Encounter  Procedures  . LONG TERM MONITOR (3-14 DAYS)  . PCV MYOCARDIAL PERFUSION WITH LEXISCAN  . EKG 12-Lead  . ECHOCARDIOGRAM COMPLETE    There are no Patient Instructions on file for this visit.   --Continue cardiac medications as reconciled in final medication list. --Return in about 6 weeks (around 01/16/2020) for re-evaluation of symptoms., review test results.. Or sooner if needed. --Continue follow-up with your primary care physician regarding the management of your other chronic comorbid conditions.  Patient's questions and concerns were addressed to her satisfaction. She voices understanding of the instructions provided during this encounter.   This note was created using a voice recognition software as a result there may be grammatical errors inadvertently enclosed that do not reflect the nature of this encounter. Every attempt is made to correct such errors.  Rex Kras, [place], [facility]  Pager: [phone] Office: [phone]

## 2019-12-07 ENCOUNTER — Ambulatory Visit (HOSPITAL_COMMUNITY): Payer: Medicaid Other

## 2019-12-12 NOTE — Progress Notes (Signed)
Attempted to call patient, no answer, left voicemail to please call back and gave phone number to call back.

## 2019-12-12 NOTE — Progress Notes (Signed)
Patient returned call and we have set up an appointment with Dr Lamonte Sakai (Per Dr Melvyn Novas) for September 9th at 3pm per patient request. Attempted to get patient seen sooner but patient stated she was unable to come in anytime in August due to school schedule and having other appointments scheduled with other providers.

## 2019-12-13 ENCOUNTER — Other Ambulatory Visit: Payer: Self-pay

## 2019-12-13 ENCOUNTER — Ambulatory Visit: Payer: Medicaid Other

## 2019-12-13 ENCOUNTER — Ambulatory Visit (HOSPITAL_COMMUNITY)
Admission: RE | Admit: 2019-12-13 | Discharge: 2019-12-13 | Disposition: A | Discharge status: Home / Self Care | Payer: Medicaid Other | Source: Ambulatory Visit | Attending: Cardiology | Admitting: Cardiology

## 2019-12-13 DIAGNOSIS — R0600 Dyspnea, unspecified: Secondary | ICD-10-CM | POA: Diagnosis not present

## 2019-12-13 DIAGNOSIS — R0609 Other forms of dyspnea: Secondary | ICD-10-CM

## 2019-12-13 DIAGNOSIS — R002 Palpitations: Secondary | ICD-10-CM

## 2019-12-13 NOTE — Progress Notes (Signed)
  Echocardiogram 2D Echocardiogram has been performed.   12/13/2019, 3:08 PM

## 2019-12-19 LAB — ECHOCARDIOGRAM COMPLETE
Area-P 1/2: 3.12 cm2
S' Lateral: 3.3 cm

## 2019-12-24 ENCOUNTER — Ambulatory Visit: Payer: Medicaid Other | Admitting: Skilled Nursing Facility1

## 2019-12-24 ENCOUNTER — Encounter: Payer: Medicaid Other | Admitting: Skilled Nursing Facility1

## 2020-01-03 ENCOUNTER — Ambulatory Visit (INDEPENDENT_AMBULATORY_CARE_PROVIDER_SITE_OTHER): Payer: Medicaid Other | Admitting: Emergency Medicine

## 2020-01-03 ENCOUNTER — Encounter: Payer: Self-pay | Admitting: Emergency Medicine

## 2020-01-03 ENCOUNTER — Other Ambulatory Visit: Payer: Self-pay

## 2020-01-03 VITALS — BP 100/70 | HR 104 | Temp 98.0°F | Ht 63.0 in | Wt >= 6400 oz

## 2020-01-03 DIAGNOSIS — R918 Other nonspecific abnormal finding of lung field: Secondary | ICD-10-CM | POA: Diagnosis not present

## 2020-01-03 NOTE — Patient Instructions (Signed)
We will perform pulmonary function testing in next office visit. We will repeat your CT scan of the chest to compare with October 2020. Depending on your results above we will decide whether to pursue bronchoscopy or any other testing. Follow with Dr. Lamonte Sakai next available with PFT on the same day.

## 2020-01-03 NOTE — Assessment & Plan Note (Signed)
Imaging and notes reviewed.  I suspect based on her ACE level,, characteristic infiltrates, history of pulmonary rash that this is sarcoidosis.  Interestingly she does not have any lymphadenopathy.  She rightly asks whether we can do for bronchoscopy or whether she should push forward.  I think the deciding factor should be whether her pulmonary infiltrates persist or have progressed.  We will repeat her CT scan of the chest to compare with October.  If her infiltrates have progressed then navigational bronchoscopy to one of her infiltrates under general anesthesia would be appropriate.  I think general anesthesia will be the safest approach given her morbid obesity. We will also perform pulmonary function testing to evaluate for any obstructive lung disease that might benefit from bronchodilator therapy.

## 2020-01-03 NOTE — Progress Notes (Signed)
Subjective:    Patient ID: Diana Blair, female    DOB: 1980-11-11, 39 y.o.   MRN: [id number]  HPI 39 year old former smoker with history of obesity, PCOS, diverticulitis, GERD. She has been seen by Dr. Melvyn Novas in our office for pulmonary infiltrates, dyspnea and suspected sarcoidosis.  She has elevated ACE level, most recently 112 on 11/26/2019.  Her most recent chest imaging was a chest x-ray 11/27/2019 which I have reviewed, shows patchy groundglass opacities and consolidation in the upper lobes. She has had palmar rash before, also on soles of her feet - resolved on its own. She has baseline dyspnea w exertion, with supine position. No wheeze or cough. She has generalized arthritic pain. Her last prednisone was 02/2019.     CT scan of the chest 02/21/2019 reviewed by me, shows bilateral focal opacities in her peribronchial, sometimes nodular masslike.  No effusions, no mediastinal hilar or axillary lymphadenopathy.   Review of Systems As per HPI  Past Medical History:  Diagnosis Date   Chronic hypertension in obstetric context in first trimester 05/04/2016   Diverticulitis    Heart palpitations    Morbid obesity with BMI of 50.0-59.9, adult ([facility])    BMI 58.8   PCOS (polycystic ovarian syndrome)    Seizure ([place])    as a child     Family History  Problem Relation Age of Onset   Cancer Mother        lung   Hypertension Mother    Diabetes Father    Heart failure Father    Diabetes Paternal Grandmother    Hypertension Sister    Hypertension Brother    Rashes / Skin problems Sister    Other Neg Hx      Social History   Socioeconomic History   Marital status: Legally Separated    Spouse name: Not on file   Number of children: 1   Years of education: Not on file   Highest education level: Not on file  Occupational History   Not on file  Tobacco Use   Smoking status: Former Smoker    Packs/day: 1.50    Years: 6.00    Pack years: 9.00    Types:  Cigarettes    Quit date: 02/08/2009    Years since quitting: 10.9   Smokeless tobacco: Never Used  Vaping Use   Vaping Use: Never used  Substance and Sexual Activity   Alcohol use: No   Drug use: No   Sexual activity: Yes    Birth control/protection: None  Other Topics Concern   Not on file  Social History Narrative   Not on file   Social Determinants of Health   Financial Resource Strain:    Difficulty of Paying Living Expenses: Not on file  Food Insecurity:    Worried About Charity fundraiser in the Last Year: Not on file   [employer] of Food in the Last Year: Not on file  Transportation Needs:    Lack of Transportation (Medical): Not on file   Lack of Transportation (Non-Medical): Not on file  Physical Activity:    Days of Exercise per Week: Not on file   Minutes of Exercise per Session: Not on file  Stress:    Feeling of Stress : Not on file  Social Connections:    Frequency of Communication with Friends and Family: Not on file   Frequency of Social Gatherings with Friends and Family: Not on file   Attends Religious Services: Not  on file   Active Member of Clubs or Organizations: Not on file   Attends Archivist Meetings: Not on file   Marital Status: Not on file  Intimate Partner Violence:    Fear of Current or Ex-Partner: Not on file   Emotionally Abused: Not on file   Physically Abused: Not on file   Sexually Abused: Not on file    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    No Known Allergies   Outpatient Medications Prior to Visit  Medication Sig Dispense Refill   Cyanocobalamin (VITAMIN B-12 PO) Take by mouth.     ergocalciferol (VITAMIN D2) 1.25 MG (50000 UT) capsule 50,000 Units once a week.     famotidine (PEPCID) 20 MG tablet Take 1 tablet (20 mg total) by mouth at bedtime.     metroNIDAZOLE (METROGEL) 0.75 % vaginal gel Place 1 Applicatorful vaginally at bedtime. Apply one applicatorful to vagina  at bedtime for 14 days 70 g 1   pantoprazole (PROTONIX) 40 MG tablet Take 1 tablet (40 mg total) by mouth daily. 90 tablet 5   predniSONE (DELTASONE) 10 MG tablet Take  2 each am until better then 1 daily x 7 days and stop 30 tablet 0   No facility-administered medications prior to visit.        Objective:   Physical Exam Vitals:   01/03/20 1517  BP: 100/70  Pulse: (!) 104  Temp: 98 F (36.7 C)  TempSrc: Temporal  SpO2: 97%  Weight: (!) 402 lb 12.8 oz (182.7 kg)  Height: 5\' 3"  (1.6 m)   Gen: Pleasant, obese woman, in no distress,  normal affect  ENT: No lesions,  mouth clear,  oropharynx clear, no postnasal drip  Neck: No JVD, no stridor  Lungs: No use of accessory muscles, distant, no crackles or wheezing on normal respiration, no wheeze on forced expiration  Cardiovascular: RRR, heart sounds normal, no murmur or gallops, no peripheral edema  Musculoskeletal: No deformities, no cyanosis or clubbing  Neuro: alert, awake, non focal  Skin: Warm, no lesions or rash      Assessment & Plan:  Pulmonary infiltrates on CXR Imaging and notes reviewed.  I suspect based on her ACE level,, characteristic infiltrates, history of pulmonary rash that this is sarcoidosis.  Interestingly she does not have any lymphadenopathy.  She rightly asks whether we can do for bronchoscopy or whether she should push forward.  I think the deciding factor should be whether her pulmonary infiltrates persist or have progressed.  We will repeat her CT scan of the chest to compare with October.  If her infiltrates have progressed then navigational bronchoscopy to one of her infiltrates under general anesthesia would be appropriate.  I think general anesthesia will be the safest approach given her morbid obesity. We will also perform pulmonary function testing to evaluate for any obstructive lung disease that might benefit from bronchodilator therapy.  Baltazar Apo, MD, PhD 01/03/2020, 3:50 PM [facility]  Pulmonary and Critical Care [phone] or if no answer [phone]

## 2020-01-04 ENCOUNTER — Telehealth: Payer: Self-pay

## 2020-01-04 NOTE — Telephone Encounter (Signed)
When did she get the monitor? [employer] may still be processing the data.

## 2020-01-07 ENCOUNTER — Ambulatory Visit: Payer: Medicaid Other | Admitting: Cardiology

## 2020-01-09 ENCOUNTER — Ambulatory Visit
Admission: RE | Admit: 2020-01-09 | Discharge: 2020-01-09 | Disposition: A | Discharge status: Home / Self Care | Payer: Medicaid Other | Source: Ambulatory Visit | Attending: Emergency Medicine | Admitting: Emergency Medicine

## 2020-01-09 DIAGNOSIS — R918 Other nonspecific abnormal finding of lung field: Secondary | ICD-10-CM

## 2020-01-14 ENCOUNTER — Encounter (HOSPITAL_COMMUNITY): Payer: Self-pay | Admitting: Emergency Medicine

## 2020-01-14 ENCOUNTER — Ambulatory Visit (HOSPITAL_COMMUNITY)
Admission: EM | Admit: 2020-01-14 | Discharge: 2020-01-14 | Disposition: A | Discharge status: Home / Self Care | Payer: Medicaid Other | Attending: Internal Medicine | Admitting: Internal Medicine

## 2020-01-14 ENCOUNTER — Other Ambulatory Visit: Payer: Self-pay

## 2020-01-14 DIAGNOSIS — Z20822 Contact with and (suspected) exposure to covid-19: Secondary | ICD-10-CM | POA: Insufficient documentation

## 2020-01-14 DIAGNOSIS — I10 Essential (primary) hypertension: Secondary | ICD-10-CM | POA: Insufficient documentation

## 2020-01-14 DIAGNOSIS — Z87891 Personal history of nicotine dependence: Secondary | ICD-10-CM | POA: Diagnosis not present

## 2020-01-14 DIAGNOSIS — E282 Polycystic ovarian syndrome: Secondary | ICD-10-CM | POA: Diagnosis not present

## 2020-01-14 DIAGNOSIS — Z6841 Body Mass Index (BMI) 40.0 and over, adult: Secondary | ICD-10-CM | POA: Diagnosis not present

## 2020-01-14 DIAGNOSIS — J069 Acute upper respiratory infection, unspecified: Secondary | ICD-10-CM | POA: Insufficient documentation

## 2020-01-14 DIAGNOSIS — K219 Gastro-esophageal reflux disease without esophagitis: Secondary | ICD-10-CM | POA: Diagnosis not present

## 2020-01-14 DIAGNOSIS — Z79899 Other long term (current) drug therapy: Secondary | ICD-10-CM | POA: Insufficient documentation

## 2020-01-14 NOTE — ED Provider Notes (Signed)
[place]    CSN: [id number] Arrival date & time: 01/14/20  1934      History   Chief Complaint Chief Complaint  Patient presents with  . Cough    HPI Diana Blair is a 39 y.o. female with past medical history of morbid obesity presents to urgent care today with reports of cough and congestion.  Patient states symptoms have been intermittent for the past month but decided to come in as daughter now with similar symptoms.  Patient also reports sense of taste and smell are "off" over the past month.  Patient denies any fever, chills, headache, dizziness, chest pain, SOB, abdominal pain, nausea or vomiting.  Patient did have episode of loose stool last week but none recurrent.  No known sick contacts.    Past Medical History:  Diagnosis Date  . 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 Diverticulitis   . Heart palpitations   . Morbid obesity with BMI of 50.0-59.9, adult (HCC)    BMI 58.8  . PCOS (polycystic ovarian syndrome)   . Seizure [facility])    as a child    Patient Active Problem List   Diagnosis Date Noted  . Dysphagia 08/05/2019  . Gastroesophageal reflux disease 03/20/2019  . Pulmonary infiltrates on CXR 02/26/2019  . Upper airway cough syndrome 02/26/2019  . Advanced maternal age in multigravida 11/10/2016  . Chronic hypertension in pregnancy 11/10/2016  . IUGR (intrauterine growth restriction) affecting care of mother, third trimester, other fetus 11/05/2016  . BMI 60.0-69.9, adult ([place]) 05/04/2016  . History of preterm delivery 08/20/2015  . History of cervical cerclage 08/20/2015  . PCOS (polycystic ovarian syndrome) 02/08/2013    Past Surgical History:  Procedure Laterality Date  . CERVICAL CERCLAGE  03/13/2012   Procedure: CERCLAGE CERVICAL;  Surgeon: Jonnie Kind, MD;  Location: [facility];  Service: Gynecology;  Laterality: N/A;  . CERVICAL CERCLAGE  03/17/2012   Procedure: CERCLAGE CERVICAL;  Surgeon: Osborne Oman, MD;  Location: [facility];  Service: Gynecology;  Laterality: N/A;  Removal    OB History    Gravida  2   Para  2   Term  1   Preterm  1   AB  0   Living  1     SAB  0   TAB  0   Ectopic  0   Multiple  0   Live Births  2            Home Medications    Prior to Admission medications   Medication Sig Start Date End Date Taking? Authorizing Provider  Cyanocobalamin (VITAMIN B-12 PO) Take by mouth.    [provider]  ergocalciferol (VITAMIN D2) 1.25 MG (50000 UT) capsule 50,000 Units once a week. 09/13/19   [provider]  famotidine (PEPCID) 20 MG tablet Take 1 tablet (20 mg total) by mouth at bedtime. 09/03/19   Milus Banister, MD  metroNIDAZOLE (METROGEL) 0.75 % vaginal gel Place 1 Applicatorful vaginally at bedtime. Apply one applicatorful to vagina at bedtime for 14 days 10/03/19   Sloan Leiter, MD  pantoprazole (PROTONIX) 40 MG tablet Take 1 tablet (40 mg total) by mouth daily. 10/16/19   Tanda Rockers, MD  predniSONE (DELTASONE) 10 MG tablet Take  2 each am until better then 1 daily x 7 days and stop 09/03/19   Tanda Rockers, MD  albuterol (PROVENTIL HFA;VENTOLIN HFA) 108 (90 Base) MCG/ACT inhaler Inhale  1-2 puffs into the lungs every 4 (four) hours as needed for wheezing or shortness of breath (or cough). 04/01/18 03/20/19  Street, [place], PA-C    Family History Family History  Problem Relation Age of Onset  . Cancer Mother        lung  . Hypertension Mother   . Diabetes Father   . Heart failure Father   . Diabetes Paternal Grandmother   . Hypertension Sister   . Hypertension Brother   . Rashes / Skin problems Sister   . Other Neg Hx     Social History Social History   Tobacco Use  . Smoking status: Former Smoker    Packs/day: 1.50    Years: 6.00    Pack years: 9.00    Types: Cigarettes    Quit date: 02/08/2009    Years since quitting: 10.9  . Smokeless tobacco: Never Used  Vaping Use  . Vaping Use: Never used   Substance Use Topics  . Alcohol use: No  . Drug use: No     Allergies   Patient has no known allergies.   Review of Systems As stated in HPI otherwise negative   Physical Exam Triage Vital Signs ED Triage Vitals  Enc Vitals Group     BP 01/14/20 2050 137/81     Pulse Rate 01/14/20 2050 (!) 101     Resp 01/14/20 2050 20     Temp 01/14/20 2050 98.7 F (37.1 C)     Temp Source 01/14/20 2050 Oral     SpO2 01/14/20 2050 100 %     Weight --      Height --      Head Circumference --      Peak Flow --      Pain Score 01/14/20 2047 0     Pain Loc --      Pain Edu? --      Excl. in [place]? --    No data found.  Updated Vital Signs BP 137/81 (BP Location: Left Wrist)   Pulse (!) 101   Temp 98.7 F (37.1 C) (Oral)   Resp 20   SpO2 100%   Visual Acuity Right Eye Distance:   Left Eye Distance:   Bilateral Distance:    Right Eye Near:   Left Eye Near:    Bilateral Near:     Physical Exam Constitutional:      General: She is not in acute distress.    Appearance: Normal appearance. She is obese. She is not ill-appearing or toxic-appearing.  HENT:     Right Ear: Tympanic membrane normal.     Left Ear: Tympanic membrane normal.     Nose: Nose normal. No rhinorrhea.     Mouth/Throat:     Mouth: Mucous membranes are moist.     Pharynx: Oropharynx is clear. No oropharyngeal exudate or posterior oropharyngeal erythema.  Eyes:     General: No scleral icterus.    Extraocular Movements: Extraocular movements intact.  Cardiovascular:     Rate and Rhythm: Normal rate and regular rhythm.     Heart sounds: Normal heart sounds.  Pulmonary:     Effort: Pulmonary effort is normal.     Breath sounds: Normal breath sounds. No wheezing, rhonchi or rales.  Abdominal:     General: Bowel sounds are normal.     Palpations: Abdomen is soft.  Musculoskeletal:     Cervical back: Normal range of motion and neck supple. No rigidity.  Skin:  General: Skin is warm and dry.   Neurological:     General: No focal deficit present.     Mental Status: She is alert and oriented to person, place, and time.  Psychiatric:        Mood and Affect: Mood normal.      UC Treatments / Results  Labs (all labs ordered are listed, but only abnormal results are displayed) Labs Reviewed  SARS CORONAVIRUS 2 (TAT 6-24 HRS)    EKG   Radiology No results found.  Procedures Procedures (including critical care time)  Medications Ordered in UC Medications - No data to display  Initial Impression / Assessment and Plan / UC Course  I have reviewed the triage vital signs and the nursing notes.  Pertinent labs & imaging results that were available during my care of the patient were reviewed by me and considered in my medical decision making (see chart for details).  Viral URI with cough -Symptoms intermittent x1 month.  VSS, nontoxic appearing.  No evidence of bacterial etiology -Suspicious for COVID-19 given change in taste and smell.  PCR sent -Isolation precautions discussed  Final Clinical Impressions(s) / UC Diagnoses   Final diagnoses:  Viral URI with cough     Discharge Instructions     We have tested you today for Covid.  Please continue to isolate until Covid test has resulted.  If positive you will need to isolate for 10 days from time of symptom onset.  Please follow-up or go to ER for any chest pain or shortness of breath.    ED Prescriptions    None     PDMP not reviewed this encounter.   Rudolpho Sevin, NP 01/14/20 2127

## 2020-01-14 NOTE — Discharge Instructions (Addendum)
We have tested you today for Covid.  Please continue to isolate until Covid test has resulted.  If positive you will need to isolate for 10 days from time of symptom onset.  Please follow-up or go to ER for any chest pain or shortness of breath.

## 2020-01-14 NOTE — ED Triage Notes (Signed)
Pt presents with cough and loss of taste and smell xs 1 mo on and off.

## 2020-01-15 LAB — SARS CORONAVIRUS 2 (TAT 6-24 HRS): SARS Coronavirus 2: NEGATIVE

## 2020-01-23 ENCOUNTER — Ambulatory Visit: Payer: Medicaid Other | Admitting: Cardiology

## 2020-01-23 ENCOUNTER — Encounter: Payer: Self-pay | Admitting: Registered"

## 2020-01-23 ENCOUNTER — Other Ambulatory Visit: Payer: Self-pay

## 2020-01-23 ENCOUNTER — Encounter: Payer: Medicaid Other | Attending: Nurse Practitioner | Admitting: Registered"

## 2020-01-23 DIAGNOSIS — R7303 Prediabetes: Secondary | ICD-10-CM | POA: Diagnosis not present

## 2020-01-23 NOTE — Progress Notes (Signed)
Medical Nutrition Therapy:  Appt start time: 1610 end time:  9604.  Assessment:  Primary concerns today: Pt referred due to prediabetes, wt management. Pt has been dx with PCOS, GERD, reports diverticulosis. Pt present for appointment alone.  Pt reports she is here regarding wt loss options. Pt reports she has been overweight her whole life. Reports wt fluctuating over 80 lb range and reports now being at heaviest wt. She reports she has been around 300 lb or above since middle school. Pt reports she has been gaining wt more recently. Reports she has never tried wt loss pills but has tried making changes to her diet and increasing activity, however activity is very limited currently. Reports her concern is  not feeling like doing daily activities. Reports sometimes struggling with ADLs due to pain. Pt reports she has been told she may have fibromyalgia and sarcoidosis. Pt interested in water aerobics. Wants to know if there is a specific place she can go for medical water aerobic classes.   Pt reports she does not sit and eat all the time but often does not eat often enough. Pt has seen a nutritionist in the past and reports she was told she wasn't eating enough and that was causing her metabolism to slow down.  Pt feels her issue is when she eats more so than what she eats. Reports GI issues and often won't eat until her stomach feels settled. Pt is a mother of a 78 year old, in school and recently divorced right before covid. Reports always being on the go and often only eating 1-2 times a day. Reports she does not have time to feel bad (due to reflux) after eating. Pt cannot say how often she gets reflux. Reports she will do well for a while and then may skip her medications and then have issues with reflux. Pt reports she often prepares foods based on what her daughter will eat. She reports when she has eaten healthier consistently in the past she felt her wt maintained but did not decrease and her goal  here today is to lose wt. Pt also reports she does not like cooking and is sometimes picky about what foods she wants to eat. Pt also reports that when she cooks she likes to cook in [place] fashion. Pt reports she does not think improving nutrition or eating more consistently is what she needs, she just wants to lose wt. Pt reports she is interested in wt loss surgery, however she does not want a surgery that will entail cutting her stomach/intestines. Reports she wants something like a band if that is an option. After dietitian discussed that forming healthy eating habits  will be important for pt even if she does have wt loss surgery to promote long-term success, pt reports she is willing to work on recommendations discussed today in regards to building healthy and consistent eating habits.   Food Allergies/Intolerances: None reported. Pt questions whether she may have lactose intolerance.   GI Concerns: Pt reports severe reflux. Reports at one time she would aspirate with reflux and had to take 2 Protonix daily. Reports now it is intermittent and she cannot pinpoint how often. Reports will be doing well and then if she misses her medication will have reflux. Pt does not know if any certain foods exacerbate it because she has not had time to journal foods. Pt reports she knows about what to avoid for GERD. Pt has tried alkaline water, unsure if it helped but was  mixing with either regular water or orange juice.   Pertinent Lab Values: 10/18/19: Vitamin D: 21 10/03/19:  HgbA1c: 5.9  Weight Hx: Reports she has been around 300 lb or more since middle school. Reports wt fluctuates around 80 lb difference.    Body Composition Scale Date 01/23/20  Current Body Weight  390.0  Total Body Fat % 53.8  Visceral Fat 26  Fat-Free Mass % 46.1   Total Body Water % 37.5  Muscle-Mass lbs 33.3  BMI 68.7  Body Fat Displacement          Torso  lbs 130.5         Left Leg  lbs 26.1         Right Leg  lbs  26.1         Left Arm  lbs 13.0         Right Arm   lbs 13.0    Preferred Learning Style:   No preference indicated   Learning Readiness:   Not ready  Contemplating  MEDICATIONS: See list. Reviewed.    DIETARY INTAKE:  Usual eating pattern includes 1-2  meals per day. Sometimes may eat breakfast in morning, other times may only eat dinner.   Common foods: N/A.  Avoided foods: beef, pork, acidic foods, peppers.    Typical Snacks: Belvita crackers.     Typical Beverages: regular water x 1.5 liters/day, sometimes alkaline water; sometimes juice-reports low acid orange juice mixed with alkaline water; occasional soda; occasionally decaf sweet tea.   Location of Meals: dinner with 51 year old daughter at table in living room.    Electronics Present at [employer]: sometimes.   24-hr recall:  B ( AM): None reported.  Snk ( AM): None reported.  L ( PM): None reported.  Snk ( PM): None reported.  D (730 PM): [employer]: chicken pita with lettuce, tomato, cucumber sauce, salad with feta, lettuce, tomato, creamy [place] dressing, water  Snk ( PM): half cup 2% milk.  Beverages: water, half cup 2% milk, 1 Red Bull   Usual physical activity: limited. Pt has treadmill at home. Reports sometimes ADLs are difficult to complete. Minutes/Week: N/A  Progress Towards Goal(s):  In progress.   Nutritional Diagnosis:  NI-5.11.1 Predicted suboptimal nutrient intake As related to skipping meals .  As evidenced by pt reports eating 1-2 times per day.    Intervention:  Nutrition counseling provided. Dietitian provided education regarding importance of balanced and consistent nutrition. Discussed with pt importance of establishing consistent pattern of eating to promote overall health, including managing prediabetes, GERD, heart health, and may help promote wt loss, but recommend focusing on healthy habits to promote health rather than wt loss. Also provided counseling on how PCOS makes wt loss  more challenging. Discussed how going long periods without eating can induce reflux. Encouraged noting any foods that appear to exacerbate reflux. Pt reports she does not feel eating healthier/more consistent will be helpful-pt's goal is wt loss by a different means than healthy nutrition. Discussed with pt that forming healthier eating habits is an important part of weight loss surgery as well and necessary for long-term success after surgery as well as helping to prevent nutrient deficiencies. After this discussion pt reports she is willing to work toward these recommendations as well. Discussed referring pt to see colleague, Sandie Ano, MS, RDN, LDN for follow up appointment as Ubaldo Glassing specializes in bariatrics and can best answer pt's questions regarding wt loss surgery. Discussed that goals set today  will also be followed up at next visit with Ubaldo Glassing. Recommended pt ask her doctor about water aerobics/physical activity recommendations. Recommended pt try drinking alkaline water for at least 2 days and seeing how she feels. If this helps improve GERD recommend continuing. Recommended working to eat balanced meals like the plate example and not going more than 5 hours while awake without eating. Pt appears understanding to information.   Instructions/Goals:  Make sure to get in three meals per day. Try to have balanced meals like the My Plate example (see handout). Include lean proteins, vegetables, fruits, and whole grains at meals.   Try to eat something every 3-5 hours while awake, however avoid eating within 3 hours of when you lay down.  If you have an episode of reflux, take note of what you ate beforehand to see if it may be worsening reflux.  Avoid foods on the list common for causing reflux.   Recommend trying alkaline water and noting how you feel after trying for a couple days. If seeing improvement, recommend continuing with alkaline.   Make physical activity a part of your week.  Regular physical activity promotes overall health-including helping to reduce risk for heart disease and diabetes, promoting mental health, and helping [place] sleep better.    Recommend gradually adding more time-water aerobics is great! Recommend asking your doctor for recommendation   Teaching Method Utilized:  Visual Auditory  Handouts given during visit include:  Balanced plate and food list.  Balanced snack sheet.   Nutrition Therapy for GERD  Barriers to learning/adherence to lifestyle change: Pt in pre-contemplation to contemplation stage of change; pt has multiple health conditions which limit her physical activity abilities.    Demonstrated degree of understanding via:  Teach Back   Monitoring/Evaluation:  Dietary intake, exercise, and body weight prn. Pt to follow up with colleague in 63 month.

## 2020-01-23 NOTE — Patient Instructions (Addendum)
Instructions/Goals:   Make sure to get in three meals per day. Try to have balanced meals like the My Plate example (see handout). Include lean proteins, vegetables, fruits, and whole grains at meals.   Try to eat something every 3-5 hours while awake, however avoid eating within 3 hours of when you lay down.  If you have an episode of reflux, take note of what you ate beforehand to see if it may be worsening reflux.  Avoid foods on the list common for causing reflux.   Recommend trying alkaline water and noting how you feel after trying for a couple days. If seeing improvement, recommend continuing with alkaline.   Make physical activity a part of your week. Regular physical activity promotes overall health-including helping to reduce risk for heart disease and diabetes, promoting mental health, and helping [place] sleep better.    Recommend gradually adding more time-water aerobics is great! Recommend asking your doctor for recommendation

## 2020-01-24 ENCOUNTER — Ambulatory Visit: Payer: Medicaid Other | Admitting: Registered"

## 2020-01-25 ENCOUNTER — Telehealth: Payer: Self-pay

## 2020-02-08 ENCOUNTER — Other Ambulatory Visit (HOSPITAL_COMMUNITY)
Admission: RE | Admit: 2020-02-08 | Discharge: 2020-02-08 | Disposition: A | Discharge status: Home / Self Care | Payer: Medicaid Other | Source: Ambulatory Visit | Attending: Emergency Medicine | Admitting: Emergency Medicine

## 2020-02-08 DIAGNOSIS — Z20822 Contact with and (suspected) exposure to covid-19: Secondary | ICD-10-CM | POA: Insufficient documentation

## 2020-02-08 DIAGNOSIS — Z01812 Encounter for preprocedural laboratory examination: Secondary | ICD-10-CM | POA: Diagnosis present

## 2020-02-09 LAB — SARS CORONAVIRUS 2 (TAT 6-24 HRS): SARS Coronavirus 2: NEGATIVE

## 2020-02-11 ENCOUNTER — Ambulatory Visit: Payer: Medicaid Other | Admitting: Emergency Medicine

## 2020-02-11 ENCOUNTER — Other Ambulatory Visit: Payer: Self-pay

## 2020-02-11 ENCOUNTER — Encounter: Payer: Self-pay | Admitting: Emergency Medicine

## 2020-02-11 ENCOUNTER — Ambulatory Visit (INDEPENDENT_AMBULATORY_CARE_PROVIDER_SITE_OTHER): Payer: Medicaid Other | Admitting: Emergency Medicine

## 2020-02-11 DIAGNOSIS — R918 Other nonspecific abnormal finding of lung field: Secondary | ICD-10-CM

## 2020-02-11 DIAGNOSIS — R058 Other specified cough: Secondary | ICD-10-CM

## 2020-02-11 LAB — PULMONARY FUNCTION TEST
DL/VA % pred: 108 %
DL/VA: 4.82 ml/min/mmHg/L
DLCO unc % pred: 93 %
DLCO unc: 20.99 ml/min/mmHg
FEF 25-75 Post: 2.48 L/sec
FEF 25-75 Pre: 2.32 L/sec
FEF2575-%Change-Post: 7 %
FEF2575-%Pred-Post: 84 %
FEF2575-%Pred-Pre: 78 %
FEV1-%Change-Post: 1 %
FEV1-%Pred-Post: 99 %
FEV1-%Pred-Pre: 97 %
FEV1-Post: 2.61 L
FEV1-Pre: 2.57 L
FEV1FVC-%Change-Post: 3 %
FEV1FVC-%Pred-Pre: 94 %
FEV6-%Change-Post: -1 %
FEV6-%Pred-Post: 102 %
FEV6-%Pred-Pre: 103 %
FEV6-Post: 3.18 L
FEV6-Pre: 3.22 L
FEV6FVC-%Change-Post: 0 %
FEV6FVC-%Pred-Post: 101 %
FEV6FVC-%Pred-Pre: 101 %
FVC-%Change-Post: -1 %
FVC-%Pred-Post: 100 %
FVC-%Pred-Pre: 101 %
FVC-Post: 3.18 L
FVC-Pre: 3.23 L
Post FEV1/FVC ratio: 82 %
Post FEV6/FVC ratio: 100 %
Pre FEV1/FVC ratio: 80 %
Pre FEV6/FVC Ratio: 100 %
RV % pred: 86 %
RV: 1.36 L
TLC % pred: 88 %
TLC: 4.54 L

## 2020-02-11 NOTE — Assessment & Plan Note (Signed)
Please continue your Pepcid and Protonix as you have been taking them.  Stay on these medicines will be important to help prevent your upper airway symptoms.

## 2020-02-11 NOTE — Patient Instructions (Addendum)
Your CT scan shows stable changes that are suggestive of possible sarcoid.  No new infiltrates or evidence for active disease. Your pulmonary function testing shows overall normal airflows but with some suggestion of possible upper airway irritation and possible mild abnormal airflow.  This can sometimes go along with sarcoidosis. Please let us know if you have any changes in your breathing or coughing, any new rash.  If so then we may decide to arrange for testing to further evaluate.  This could include skin biopsy or possibly bronchoscopy to perform lung biopsies. We will plan to repeat your CT scan of the chest in October 2022.  Depending on your symptoms we may decide to do this sooner. Please continue your Pepcid and Protonix as you have been taking them.  Stay on these medicines will be important to help prevent your upper airway symptoms. Follow with Dr Lamonte Sakai in 6 months or sooner if you have any problems

## 2020-02-11 NOTE — Progress Notes (Signed)
Subjective:    Patient ID: Diana Blair, female    DOB: 04/24/1981, 39 y.o.   MRN: [id number]  HPI 39 year old former smoker with history of obesity, PCOS, diverticulitis, GERD. She has been seen by Dr. Melvyn Novas in our office for pulmonary infiltrates, dyspnea and suspected sarcoidosis.  She has elevated ACE level, most recently 112 on 11/26/2019.  Her most recent chest imaging was a chest x-ray 11/27/2019 which I have reviewed, shows patchy groundglass opacities and consolidation in the upper lobes. She has had palmar rash before, also on soles of her feet - resolved on its own. She has baseline dyspnea w exertion, with supine position. No wheeze or cough. She has generalized arthritic pain. Her last prednisone was 02/2019.     CT scan of the chest 02/21/2019 reviewed by me, shows bilateral focal opacities in her peribronchial, sometimes nodular masslike.  No effusions, no mediastinal hilar or axillary lymphadenopathy.  ROV 02/11/20 --39 year old woman who follows up today for suspected sarcoidosis.  She has had pulmonary infiltrates, waxing and waning typical rash.  An ACE level that was elevated.  We repeated her CT scan of the chest 01/10/2020 and I have reviewed, shows persistent patchy areas of interstitial thickening and regional nodular disease bilaterally, no adenopathy, splenomegaly. No real increase in infiltrates.  She is not really experiencing much in way of symptoms. Occasionally she gets UA throat irritation and some associated SOB, cough. Has been better since she has been on protonix / pepcid.   Pulmonary function testing done today reviewed by me, shows grossly normal airflows but with some curve her flow volume loop that could suggest mild obstruction.  No bronchodilator response.  There is intermittent irregularity of the inspiratory loop of her flow volume curve that could suggest upper airway instability.  Her lung volumes are normal, diffusion capacity is normal.   Review of  Systems As per HPI       Objective:   Physical Exam Vitals:   02/11/20 1617  BP: (!) 148/72  Pulse: 94  Temp: (!) 97.3 F (36.3 C)  TempSrc: Temporal  SpO2: 98%  Weight: (!) 394 lb (178.7 kg)  Height: 5' 4.8" (1.646 m)   Gen: Pleasant, obese woman, in no distress,  normal affect  ENT: No lesions,  mouth clear,  oropharynx clear, no postnasal drip  Neck: No JVD, no stridor  Lungs: No use of accessory muscles, distant, no crackles or wheezing on normal respiration, no wheeze on forced expiration  Cardiovascular: RRR, heart sounds normal, no murmur or gallops, no peripheral edema  Musculoskeletal: No deformities, no cyanosis or clubbing  Neuro: alert, awake, non focal  Skin: Warm, no lesions or rash      Assessment & Plan:  Upper airway cough syndrome Please continue your Pepcid and Protonix as you have been taking them.  Stay on these medicines will be important to help prevent your upper airway symptoms.  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  Based on stability on CT and the linear nature of the infiltrates this is probably an active disease.  If she has flaring symptoms or flaring infiltrates then I would favor biopsy via bronchoscopy or possibly skin biopsy if she develops rash.  She will let us know if symptoms occur  Your CT scan shows stable changes that are suggestive of possible sarcoid.  No new infiltrates or evidence for active disease. Your pulmonary function testing shows overall normal airflows but with some suggestion of possible  upper airway irritation and possible mild abnormal airflow.  This can sometimes go along with sarcoidosis. Please let us know if you have any changes in your breathing or coughing, any new rash.  If so then we may decide to arrange for testing to further evaluate.  This could include skin biopsy or possibly bronchoscopy to perform lung biopsies. We will plan to repeat your CT scan of the chest in  October 2022.  Depending on your symptoms we may decide to do this sooner.. Follow with Dr Lamonte Sakai in 6 months or sooner if you have any problems  Baltazar Apo, MD, PhD 02/11/2020, 4:51 PM [facility] [phone] or if no answer [phone]

## 2020-02-11 NOTE — Assessment & Plan Note (Signed)
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  Based on stability on CT and the linear nature of the infiltrates this is probably an active disease.  If she has flaring symptoms or flaring infiltrates then I would favor biopsy via bronchoscopy or possibly skin biopsy if she develops rash.  She will let us know if symptoms occur  Your CT scan shows stable changes that are suggestive of possible sarcoid.  No new infiltrates or evidence for active disease. Your pulmonary function testing shows overall normal airflows but with some suggestion of possible upper airway irritation and possible mild abnormal airflow.  This can sometimes go along with sarcoidosis. Please let us know if you have any changes in your breathing or coughing, any new rash.  If so then we may decide to arrange for testing to further evaluate.  This could include skin biopsy or possibly bronchoscopy to perform lung biopsies. We will plan to repeat your CT scan of the chest in October 2022.  Depending on your symptoms we may decide to do this sooner.. Follow with Dr Lamonte Sakai in 6 months or sooner if you have any problems

## 2020-02-15 ENCOUNTER — Other Ambulatory Visit: Payer: Self-pay | Admitting: Cardiology

## 2020-02-15 ENCOUNTER — Telehealth: Payer: Self-pay

## 2020-02-15 DIAGNOSIS — R0609 Other forms of dyspnea: Secondary | ICD-10-CM

## 2020-02-15 NOTE — Progress Notes (Signed)
gxt °

## 2020-02-20 ENCOUNTER — Ambulatory Visit: Payer: Medicaid Other | Admitting: Skilled Nursing Facility1

## 2020-03-19 NOTE — Progress Notes (Signed)
Called patient, NA, LMAM

## 2020-03-24 NOTE — Progress Notes (Signed)
Pt called back, she is scheduling an appointment to go over monitor results. AD/S

## 2020-03-24 NOTE — Progress Notes (Signed)
Called pt, no answer. Left vm requesting call back. AD/S

## 2020-04-08 ENCOUNTER — Telehealth: Payer: Medicaid Other | Admitting: Cardiology

## 2020-04-08 ENCOUNTER — Other Ambulatory Visit: Payer: Self-pay

## 2020-04-08 VITALS — BP 124/82 | Ht 64.0 in | Wt >= 6400 oz

## 2020-04-08 DIAGNOSIS — Z712 Person consulting for explanation of examination or test findings: Secondary | ICD-10-CM

## 2020-04-08 DIAGNOSIS — Z6841 Body Mass Index (BMI) 40.0 and over, adult: Secondary | ICD-10-CM

## 2020-04-08 DIAGNOSIS — R0602 Shortness of breath: Secondary | ICD-10-CM

## 2020-04-08 DIAGNOSIS — Z87891 Personal history of nicotine dependence: Secondary | ICD-10-CM

## 2020-04-08 DIAGNOSIS — R002 Palpitations: Secondary | ICD-10-CM

## 2020-04-08 NOTE — Progress Notes (Signed)
Telephone visit note  Subjective:   Diana Blair, female    DOB: December 08, 1980, 39 y.o.   MRN: [id number]  PCP:  Berkley Harvey, NP      Cardiologist:  Rex Kras, DO, [facility] (established care 12/05/2019) Former Cardiology Providers: Dr. Ellyn Hack  Date: 04/08/2020 Last Office Visit: 12/05/2019  I connected with the patient on 04/08/20 by a telephone call and verified that I am speaking with the correct person using two identifiers.     I offered the patient a video enabled application for a virtual visit. Unfortunately, this could not be accomplished due to technical difficulties/lack of video enabled phone/computer. I discussed the limitations of evaluation and management by telemedicine and the availability of in person appointments. The patient expressed understanding and agreed to proceed.   This visit type was conducted due to national recommendations for restrictions regarding the COVID-19 Pandemic (e.g. social distancing).  This format is felt to be most appropriate for this patient at this time.  All issues noted in this document were discussed and addressed.  No physical exam was performed (except for noted visual exam findings with Tele health visits).  The patient has consented to conduct a Tele health visit and understands insurance will be billed.   Chief complaint:  Revaluation of shortness of breath and palpitations  HPI: 39 y.o. African-American female with history of gestational hypertension, morbid obesity was recently seen in the office in August 2021 for evaluation of shortness of breath and palpitations.  Patient was asked to follow-up in 6 weeks to reevaluate her symptoms and to go over diagnostic testing results.  At last office visit patient was complaining of dyspnea on exertion and she understood the majority of her symptoms may be secondary to her underlying morbid obesity and deconditioning.  However, patient was requesting a cardiovascular evaluation prior to  increasing physical activity to lose weight.  The shared decision was to proceed with echocardiogram and stress testing.  Echocardiogram results reviewed with the patient during today's encounter and findings noted below.  In addition, patient is made aware that her insurance company did not approve a 2-day nuclear stress test but recommended GXT.  Since the last office visit, she states that shortness of breath has improved. She sees a pulmonary medicine as well for work up of sarcoidosis (not biopsy proven per patient). Sleep Apnea evaluation is pending as well per patient.    With regards to her work-up for palpitations a 14-day extended Holter monitor was performed results reviewed with her and noted below for further reference.  Predominant rhythm was normal sinus and patient triggered events were also associated with sinus rhythm, followed by sinus tachycardia. She states that her symptoms of palpitations have resolved.   Past Medical History:  Diagnosis Date  . 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 Diverticulitis   . Heart palpitations   . Morbid obesity with BMI of 50.0-59.9, adult (HCC)    BMI 58.8  . PCOS (polycystic ovarian syndrome)   . Seizure [facility])    as a child   Past Surgical History:  Procedure Laterality Date  . CERVICAL CERCLAGE  03/13/2012   Procedure: CERCLAGE CERVICAL;  Surgeon: Jonnie Kind, MD;  Location: [facility];  Service: Gynecology;  Laterality: N/A;  . CERVICAL CERCLAGE  03/17/2012   Procedure: CERCLAGE CERVICAL;  Surgeon: Osborne Oman, MD;  Location: [facility];  Service: Gynecology;  Laterality: N/A;  Removal    Social History  Tobacco Use  . Smoking status: Former Smoker    Packs/day: 1.50    Years: 6.00    Pack years: 9.00    Types: Cigarettes    Quit date: 02/08/2009    Years since quitting: 11.2  . Smokeless tobacco: Never Used  Vaping Use  . Vaping Use: Never used  Substance Use Topics  . Alcohol use: No   . Drug use: No   Family History  Problem Relation Age of Onset  . Cancer Mother        lung  . Hypertension Mother   . Diabetes Father   . Heart failure Father   . Diabetes Paternal Grandmother   . Hypertension Sister   . Hypertension Brother   . Rashes / Skin problems Sister   . Other Neg Hx    Current Outpatient Medications on File Prior to Visit  Medication Sig Dispense Refill  . Cholecalciferol (D3-1000) 25 MCG (1000 UT) capsule Take 1,000 Units by mouth daily.    . Cyanocobalamin (VITAMIN B-12 PO) Take by mouth.    . ergocalciferol (VITAMIN D2) 1.25 MG (50000 UT) capsule 1,000 Units.     . metroNIDAZOLE (METROGEL) 0.75 % vaginal gel Place 1 Applicatorful vaginally at bedtime. Apply one applicatorful to vagina at bedtime for 14 days 70 g 1  . pantoprazole (PROTONIX) 40 MG tablet Take 1 tablet (40 mg total) by mouth daily. 90 tablet 5  . predniSONE (DELTASONE) 10 MG tablet Take  2 each am until better then 1 daily x 7 days and stop (Patient taking differently: Take 10 mg by mouth as needed.) 30 tablet 0  . [DISCONTINUED] albuterol (PROVENTIL HFA;VENTOLIN HFA) 108 (90 Base) MCG/ACT inhaler Inhale 1-2 puffs into the lungs every 4 (four) hours as needed for wheezing or shortness of breath (or cough). 1 Inhaler 0   No current facility-administered medications on file prior to visit.    Cardiovascular and other pertinent studies: EKG: 12/05/2019: Sinus bradycardia, 59 bpm, poor R wave progression, ST-T changes inferior leads III and avF, without underlying injury pattern.  Echocardiogram:  12/13/2019 1. Left ventricular ejection fraction, by estimation, is 60 to 65%. The left ventricle has normal function. The left ventricle has no regional wall motion abnormalities. Left ventricular diastolic parameters were normal. 2. Right ventricular systolic function is normal. The right ventricular size is normal. There is normal pulmonary artery systolic pressure. 3. The mitral valve is  normal in structure. No evidence of mitral valve regurgitation. 4. The aortic valve was not well visualized. Aortic valve regurgitation is not visualized. No aortic stenosis is present.  14 day extended Holter monitor: Dominant rhythm normal sinus (54-178 bpm), avg. HR 90 bpm. No atrial fibrillation, ventricular tachycardia, high grade AV block, or pauses 3 seconds or longer. Total ventricular ectopic (VE) burden <1%. Total supraventricular ectopic (SVE) burden <1%. 1 episode of SVT with aberrancy occurred on 12/20/2019 18 beats in duration at the maximum HR 141 bpm (avg 113 bpm); the run with the fastest interval was also the longest. 14 patient triggered events: Underlying rhythm normal sinus followed by sinus tachycardia. One episode associated with sinus tachycardia and SVE.  Review of Systems  Constitutional: Negative for chills and fever.  HENT: Negative for hoarse voice and nosebleeds.   Eyes: Negative for discharge, double vision and pain.  Cardiovascular: Negative for chest pain, claudication, dyspnea on exertion, leg swelling, near-syncope, orthopnea, palpitations, paroxysmal nocturnal dyspnea and syncope.  Respiratory: Positive for shortness of breath (improving). Negative for hemoptysis.  Musculoskeletal: Negative for muscle cramps and myalgias.  Gastrointestinal: Positive for heartburn. Negative for abdominal pain, constipation, diarrhea, hematemesis, hematochezia, melena, nausea and vomiting.  Neurological: Negative for dizziness and light-headedness.   Vitals:   04/07/20 1159  BP: 124/82   (Measured by the patient using a home BP monitor)  Physical Exam: Not performed, as this is a telephone visit. The physical examination is carried forward from prior office note.  CONSTITUTIONAL: Well-developed and well-nourished. No acute distress.  SKIN: Skin is warm and dry. No rash noted. No cyanosis. No pallor. No jaundice HEAD: Normocephalic and atraumatic.  EYES: No scleral  icterus MOUTH/THROAT: Moist oral membranes.  NECK: No JVD present. No thyromegaly noted. No carotid bruits  LYMPHATIC: No visible cervical adenopathy.  CHEST Normal respiratory effort. No intercostal retractions  LUNGS: Clear to auscultation in the upper lung fields.  Decreased breath sounds at the bases.  No stridor. No wheezes. No rales.  CARDIOVASCULAR: Regular, positive S1-S2, no obvious murmurs rubs or gallops appreciated. ABDOMINAL: Obese, soft, nontender, nondistended quadrants.  No apparent ascites.  EXTREMITIES: No peripheral edema  HEMATOLOGIC: No significant bruising NEUROLOGIC: Oriented to person, place, and time. Nonfocal. Normal muscle tone.  PSYCHIATRIC: Normal mood and affect. Normal behavior. Cooperative  IMPRESSION & RECOMMENDATIONS:  ROLENE ANDRADES is a 39 y.o. female whose past medical history and cardiac risk factors include: gestational hypertension, morbid obesity.    ICD-10-CM   1. Shortness of breath  R06.02   2. Palpitations  R00.2   3. Former smoker  Z87.891   4. Class 3 severe obesity due to excess calories without serious comorbidity with body mass index (BMI) greater than or equal to 70 in adult (HCC)  E66.01    Z68.45   5. Encounter to discuss test results  Z71.2    Shortness of breath: Improving.   Echo and monitor results reviewed.   Discussed that two prior authorizations were performed with her insurance company to have a 2 daynuclear stress test protocol approved; however, it was denied.    Patient states that she is reluctant to get a GXT done due to her weight and bilateral knee pain.  In addition, since her symptoms of shortness of breath are improving since last office visit and therefore would like to continue conservative management at this time.    Patient is encouraged to follow-up with pulmonary medicine for the work-up of sarcoidosis and possible obstructive disease etiology given her underlying obesity and history of smoking.     Palpitations: Resolved.    14 day extended holter monitor results reviewed with the patient. Monitor for now.   Former smoker: Educated on the importance of continued smoking cessation.  No orders of the defined types were placed in this encounter.  --Continue cardiac medications as reconciled in final medication list. --No follow-ups on file. Or sooner if needed. --Continue follow-up with your primary care physician regarding the management of your other chronic comorbid conditions.  Patient's questions and concerns were addressed to her satisfaction. She voices understanding of the instructions provided during this encounter.   This note was created using a voice recognition software as a result there may be grammatical errors inadvertently enclosed that do not reflect the nature of this encounter. Every attempt is made to correct such errors.  Total time spent: 20 minutes.  Rex Kras, DO, [facility]. [place] Office: [phone]

## 2020-04-20 ENCOUNTER — Encounter: Payer: Self-pay | Admitting: Cardiology

## 2020-06-28 IMAGING — DX DG CHEST 1V PORT
1 series · 1 of 1 positions shown · non-contrast
Comparison: Chest radiographs, CT chest 02/21/2019

CLINICAL DATA: Shortness of breath

EXAM:
PORTABLE CHEST 1 VIEW

[chest ap]
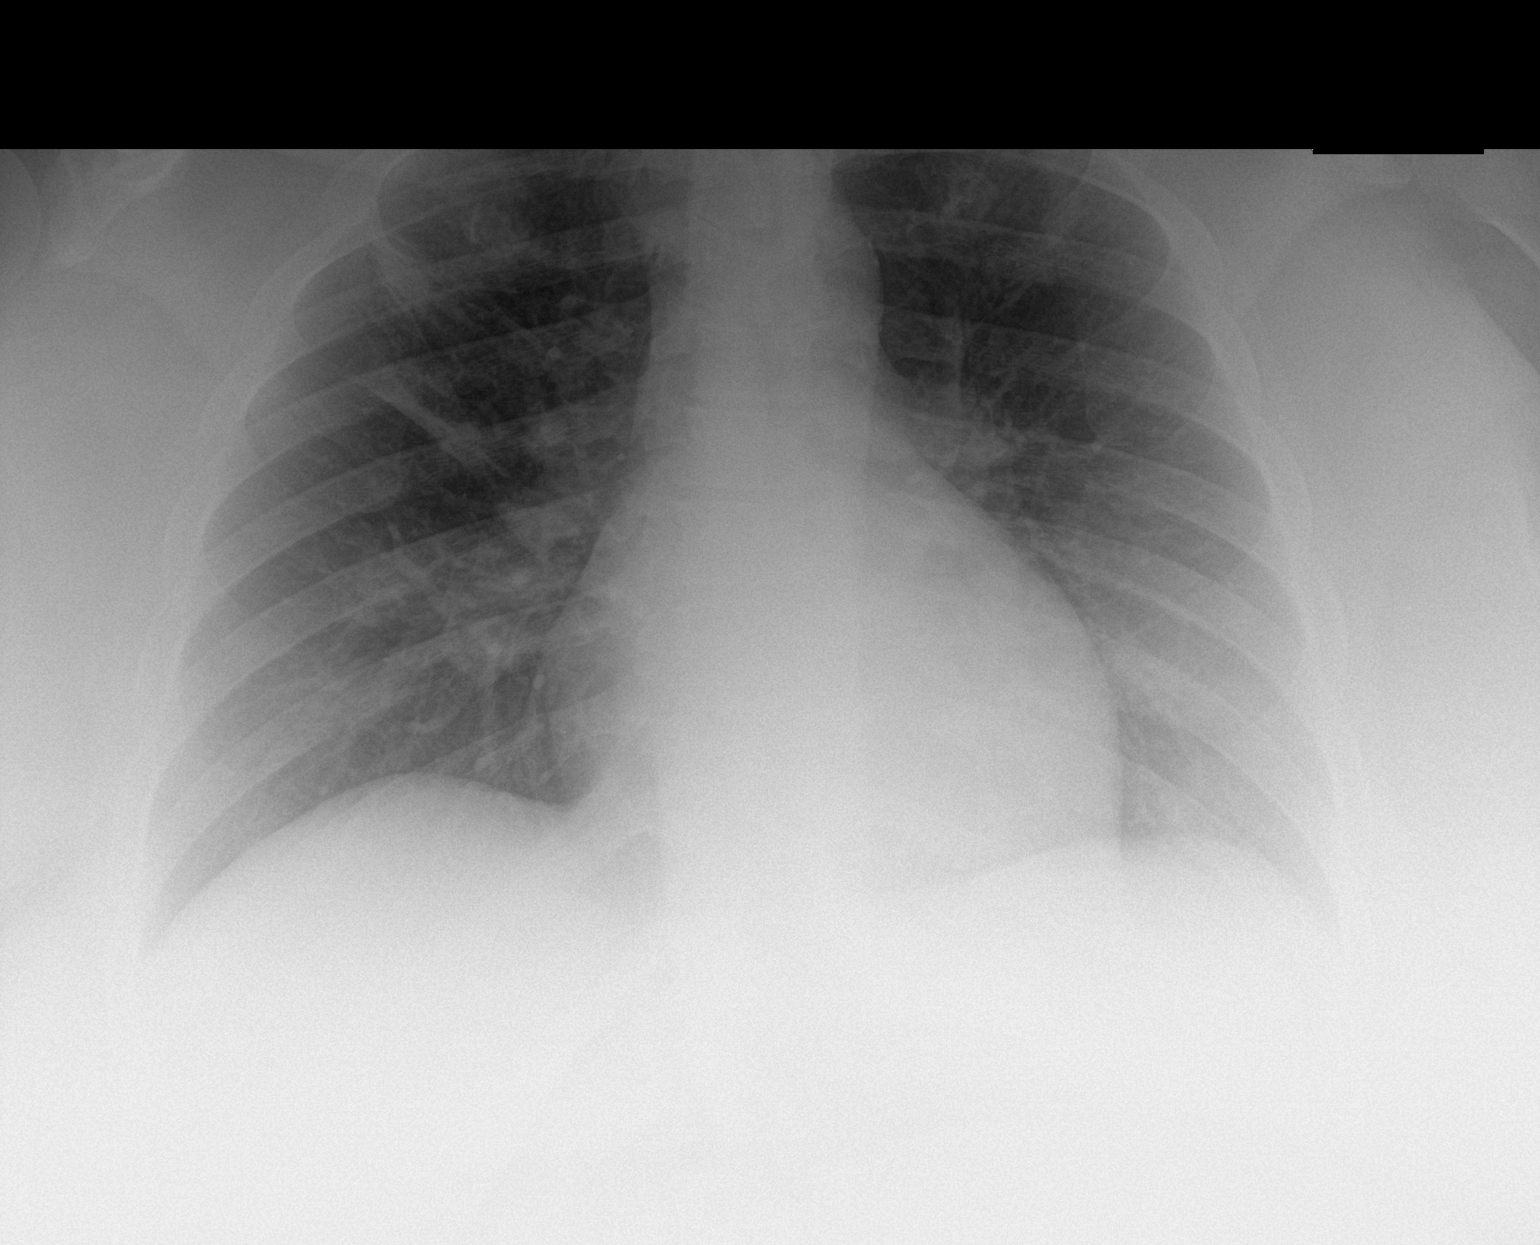

[1 of 1 positions shown; findings below may reference images not displayed]

FINDINGS: The heart size and mediastinal contours are within normal limits.
Redemonstrated irregular consolidations of the upper lobes, better
assessed by prior CT. No new airspace opacity. The visualized
skeletal structures are unremarkable.
IMPRESSION: Redemonstrated irregular consolidations of the upper lobes, better
assessed by prior CT and as seen on multiple prior chest
radiographs. No new airspace opacity.

## 2020-06-30 ENCOUNTER — Encounter: Payer: Self-pay | Admitting: Neurology

## 2020-06-30 ENCOUNTER — Ambulatory Visit: Payer: Medicaid Other | Admitting: Neurology

## 2020-06-30 VITALS — BP 126/84 | HR 98 | Ht 63.0 in | Wt >= 6400 oz

## 2020-06-30 DIAGNOSIS — G4719 Other hypersomnia: Secondary | ICD-10-CM

## 2020-06-30 DIAGNOSIS — R351 Nocturia: Secondary | ICD-10-CM

## 2020-06-30 DIAGNOSIS — Z9189 Other specified personal risk factors, not elsewhere classified: Secondary | ICD-10-CM | POA: Diagnosis not present

## 2020-06-30 DIAGNOSIS — R0683 Snoring: Secondary | ICD-10-CM | POA: Diagnosis not present

## 2020-06-30 DIAGNOSIS — Z6841 Body Mass Index (BMI) 40.0 and over, adult: Secondary | ICD-10-CM

## 2020-06-30 NOTE — Progress Notes (Signed)
Subjective:    Patient ID: Diana Blair is a 40 y.o. female.  HPI     Star Age, MD, PhD [facility] [address], [address]O. [place], [place] [address]  Dear Kieth Brightly,   I saw your patient, Diana Blair, upon your kind request in my sleep clinic today for initial consultation of her sleep disorder, in particular, concern for underlying obstructive sleep apnea.  The patient is unaccompanied today.  As you know, [name] is a 40 year old right-handed woman with an underlying medical history of hypertension, diverticulitis, palpitations, PCOS, history of seizure, prediabetes, and severe obesity with BMI of over 60, who reports snoring and excessive daytime somnolence.  I reviewed your office note from 04/07/2020.  Her Epworth sleepiness score is but it is likely an underestimation as she does report dozing off during the day.  She is in online school full-time.  She does not have a set time to go to bed and has to be up around 7 to get her child to daycare.  She is single, she lives with her 16-year-old.  She reports that she does doze off before it is time to go to bed.  She has been recommended to have a sleep study by her urologist secondary to her nocturia.  She goes to the bathroom at night generally between 1 or 3 times.  She denies recurrent morning headaches and is not aware of any sleep apnea diagnosis in her family.  She does not drink caffeine on a daily basis.  She quit smoking years ago.  She drinks alcohol rarely.  She would not be able to come in for sleep study as she does not have anybody to look after her child at night.  She does have some difficulty going to sleep at night.  In the past 2 years she reports that she has had more health problems.  She reports widespread joint pain and a diagnosis of sarcoidosis.  She has a history of snoring but is not aware of any pauses in her breathing, she does not wake up gasping for air. She has seen pain  management who was suspected that she could have fibromyalgia.  Some medication was suggested but the patient declined the medication. Sometimes, she dozes off when her daughter is awake and daughter tends to open her cabinets and gets into things such as the salt jar.  She has made a mess with baking soda before.  Patient declines referral to social work to see if we can provide any additional resources through social work.    Her Past Medical History Is Significant For: Past Medical History:  Diagnosis Date  . 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 Diverticulitis   . Heart palpitations   . Morbid obesity with BMI of 50.0-59.9, adult (HCC)    BMI 58.8  . PCOS (polycystic ovarian syndrome)   . Seizure [facility])    as a child    Her Past Surgical History Is Significant For: Past Surgical History:  Procedure Laterality Date  . CERVICAL CERCLAGE  03/13/2012   Procedure: CERCLAGE CERVICAL;  Surgeon: Jonnie Kind, MD;  Location: [place] [facility];  Service: Gynecology;  Laterality: N/A;  . CERVICAL CERCLAGE  03/17/2012   Procedure: CERCLAGE CERVICAL;  Surgeon: Osborne Oman, MD;  Location: [place] [facility];  Service: Gynecology;  Laterality: N/A;  Removal    Her Family History Is Significant For: Family History  Problem Relation Age of Onset  .  Cancer Mother        lung  . Hypertension Mother   . Diabetes Father   . Heart failure Father   . Diabetes Paternal Grandmother   . Hypertension Sister   . Hypertension Brother   . Rashes / Skin problems Sister   . Other Neg Hx     Her Social History Is Significant For: Social History   Socioeconomic History  . Marital status: Legally Separated    Spouse name: Not on file  . Number of children: 1  . Years of education: Not on file  . Highest education level: Not on file  Occupational History  . Not on file  Tobacco Use  . Smoking status: Former Smoker    Packs/day: 1.50    Years: 6.00    Pack years: 9.00     Types: Cigarettes    Quit date: 02/08/2009    Years since quitting: 11.3  . Smokeless tobacco: Never Used  Vaping Use  . Vaping Use: Never used  Substance and Sexual Activity  . Alcohol use: No  . Drug use: No  . Sexual activity: Yes    Birth control/protection: None  Other Topics Concern  . Not on file  Social History Narrative  . Not on file   Social Determinants of Health   Financial Resource Strain: Not on file  Food Insecurity: No Food Insecurity  . Worried About Charity fundraiser in the Last Year: Never true  . Ran Out of Food in the Last Year: Never true  Transportation Needs: Not on file  Physical Activity: Not on file  Stress: Not on file  Social Connections: Not on file    Her Allergies Are:  No Known Allergies:   Her Current Medications Are:  Outpatient Encounter Medications as of 06/30/2020  Medication Sig  . Cholecalciferol (D3-1000) 25 MCG (1000 UT) capsule Take 1,000 Units by mouth daily.  . Cyanocobalamin (VITAMIN B-12 PO) Take by mouth.  . famotidine (PEPCID) 20 MG tablet Take 20 mg by mouth 2 (two) times daily.  . metroNIDAZOLE (METROGEL) 0.75 % vaginal gel Place 1 Applicatorful vaginally at bedtime. Apply one applicatorful to vagina at bedtime for 14 days  . pantoprazole (PROTONIX) 40 MG tablet Take 1 tablet (40 mg total) by mouth daily.  . predniSONE (DELTASONE) 10 MG tablet Take  2 each am until better then 1 daily x 7 days and stop (Patient taking differently: Take  2 each am until better then 1 daily x 7 days and stop PRN)  . [DISCONTINUED] albuterol (PROVENTIL HFA;VENTOLIN HFA) 108 (90 Base) MCG/ACT inhaler Inhale 1-2 puffs into the lungs every 4 (four) hours as needed for wheezing or shortness of breath (or cough).  . [DISCONTINUED] ergocalciferol (VITAMIN D2) 1.25 MG (50000 UT) capsule 1,000 Units.  (Patient not taking: Reported on 06/30/2020)   No facility-administered encounter medications on file as of 06/30/2020.  :  Review of Systems:  Out of  a complete 14 point review of systems, all are reviewed and negative with the exception of these symptoms as listed below: Review of Systems  Neurological:       Here for sleep consult, no prior sleep study. Snoring is present. Epworth Sleepiness Scale 0= would never doze 1= slight chance of dozing 2= moderate chance of dozing 3= high chance of dozing  Sitting and reading: Watching TV: Sitting inactive in a public place (ex. Theater or meeting): As a passenger in a car for an hour without a break:  Lying down to rest in the afternoon: Sitting and talking to someone: Sitting quietly after lunch (no alcohol): In a car, while stopped in traffic: Total:     Objective:  Neurological Exam  Physical Exam Physical Examination:   Vitals:   06/30/20 1336  BP: 126/84  Pulse: 98  SpO2: 98%    General Examination: The patient is a very pleasant 40 y.o. female in no acute distress. She appears well-developed and well-nourished and well groomed.   HEENT: Normocephalic, atraumatic, pupils are equal, round and reactive to light, extraocular tracking is good without limitation to gaze excursion or nystagmus noted. Hearing is grossly intact. Face is symmetric with normal facial animation. Speech is clear with no dysarthria noted. There is no hypophonia. There is no lip, neck/head, jaw or voice tremor. Neck is supple with full range of passive and active motion. There are no carotid bruits on auscultation. Oropharynx exam reveals: mild mouth dryness, adequate dental hygiene and mild airway crowding, due to smaller airway entry, wider tongue, tonsillar size of about 1-2+, Mallampati class II, tongue protrudes centrally and palate elevates symmetrically, neck circumference of 19 5/8 inches.  She has a moderate overbite.  Chest: Clear to auscultation without wheezing, rhonchi or crackles noted.  Heart: S1+S2+0, regular and normal without murmurs, rubs or gallops noted.   Abdomen: Soft, non-tender  and non-distended.  Extremities: There is trace pitting edema in the distal lower extremities bilaterally.   Skin: Warm and dry without trophic changes noted.   Musculoskeletal: exam reveals no obvious joint deformities, tenderness or joint swelling or erythema.   Neurologically:  Mental status: The patient is awake, alert and oriented in all 4 spheres. Her immediate and remote memory, attention, language skills and fund of knowledge are appropriate. There is no evidence of aphasia, agnosia, apraxia or anomia. Speech is clear with normal prosody and enunciation. Thought process is linear. Mood is normal and affect is normal.  Cranial nerves II - XII are as described above under HEENT exam.  Motor exam: Normal bulk, strength and tone is noted. There is no tremor, fine motor skills and coordination: grossly intact.  Cerebellar testing: No dysmetria or intention tremor. There is no truncal or gait ataxia.  Sensory exam: intact to light touch in the upper and lower extremities.  Gait, station and balance: She stands with mild difficulty, she naturally stands wider base.  She walks without a walking aid.  Assessment and Plan:   In summary, ENYAH MOMAN is a very pleasant 40 y.o.-year old female with an underlying medical history of hypertension, diverticulitis, palpitations, PCOS, history of seizure, prediabetes, and severe obesity with BMI of over 70, whose history and physical exam are concerning for obstructive sleep apnea (OSA). I had a long chat with the patient about my findings and the diagnosis of OSA, its prognosis and treatment options. We talked about medical treatments, surgical interventions and non-pharmacological approaches. I explained in particular the risks and ramifications of untreated moderate to severe OSA, especially with respect to developing cardiovascular disease down the Road, including congestive heart failure, difficult to treat hypertension, cardiac arrhythmias, or  stroke. Even type 2 diabetes has, in part, been linked to untreated OSA. Symptoms of untreated OSA include daytime sleepiness, memory problems, mood irritability and mood disorder such as depression and anxiety, lack of energy, as well as recurrent headaches, especially morning headaches. We talked about trying to maintain a healthy lifestyle in general, as well as the importance of weight control. We also talked  about the importance of good sleep hygiene. I recommended the following at this time: sleep study.  I will order a home sleep test as the patient currently does not have anybody that can watch her 70-year-old overnight.  She reports that she sometimes dozes off when her little brother is around and active.  She declines a social work referral today. I explained the sleep test procedure to the patient and also outlined possible surgical and non-surgical treatment options of OSA, including the use of a custom-made dental device (which would require a referral to a specialist dentist or oral surgeon), upper airway surgical options, such as traditional UPPP or a novel less invasive surgical option in the form of Inspire hypoglossal nerve stimulation (which would involve a referral to an ENT surgeon). I also explained the CPAP treatment option to the patient, who indicated that she would be willing to try CPAP if the need arises. I explained the importance of being compliant with PAP treatment, not only for insurance purposes but primarily to improve Her symptoms, and for the patient's long term health benefit, including to reduce Her cardiovascular risks. She is strongly encouraged to work on weight loss. I answered all her questions today and the patient was in agreement. I plan to see her back after the sleep study is completed and encouraged her to call with any interim questions, concerns, problems or updates.   Thank you very much for allowing me to participate in the care of this nice patient. If I  can be of any further assistance to you please do not hesitate to call me at [phone].  Sincerely,   Star Age, MD, PhD

## 2020-06-30 NOTE — Patient Instructions (Signed)

## 2020-07-01 ENCOUNTER — Telehealth: Payer: Self-pay

## 2020-07-01 NOTE — Telephone Encounter (Signed)
LVM for pt to call me back to schedule sleep study  

## 2020-07-03 ENCOUNTER — Telehealth: Payer: Self-pay

## 2020-07-03 NOTE — Telephone Encounter (Signed)
LVM for pt to call me back to schedule sleep study  

## 2020-07-07 ENCOUNTER — Telehealth: Payer: Self-pay

## 2020-07-07 NOTE — Telephone Encounter (Signed)
We have attempted to call the patient two times to schedule sleep study.  Patient has been unavailable at the phone numbers we have on file and has not returned our calls. If patient calls back we will schedule them for their sleep study.  

## 2020-07-08 ENCOUNTER — Telehealth: Payer: Self-pay | Admitting: Emergency Medicine

## 2020-07-08 NOTE — Telephone Encounter (Signed)
07/08/2020  Would recommend patient continues to monitor symptoms.  Please go ahead and schedule patient for follow-up with Dr. Lamonte Sakai as she is due.  If patient symptoms worsen patient to be scheduled for an in office visit with a chest x-ray with APP or Dr. Melina Modena, FNP

## 2020-07-08 NOTE — Telephone Encounter (Signed)
Call returned to patient, made aware of BM recommendations. Patient requested an appt. Appt made.   Nothing further needed at this time.

## 2020-07-08 NOTE — Telephone Encounter (Signed)
Call made to patient, confirmed DOB. Patient reports developing a dry cough 3 weeks ago. She does not report much change with her breathing. Denies chest pain. Denies fevers. She reports her she feels like she does everytime she gets pneumonia. She reports she got a cold from her daughter and initially was taking OTC dayquil which she is no longer taking. No other symptoms reported.   BM please advise.

## 2020-07-11 ENCOUNTER — Ambulatory Visit: Payer: Self-pay | Admitting: Adult Health

## 2020-07-28 ENCOUNTER — Ambulatory Visit (INDEPENDENT_AMBULATORY_CARE_PROVIDER_SITE_OTHER): Payer: Medicaid Other | Admitting: Neurology

## 2020-07-28 DIAGNOSIS — R351 Nocturia: Secondary | ICD-10-CM

## 2020-07-28 DIAGNOSIS — Z6841 Body Mass Index (BMI) 40.0 and over, adult: Secondary | ICD-10-CM

## 2020-07-28 DIAGNOSIS — G4719 Other hypersomnia: Secondary | ICD-10-CM

## 2020-07-28 DIAGNOSIS — G4733 Obstructive sleep apnea (adult) (pediatric): Secondary | ICD-10-CM

## 2020-07-28 DIAGNOSIS — R0683 Snoring: Secondary | ICD-10-CM

## 2020-07-28 DIAGNOSIS — Z9189 Other specified personal risk factors, not elsewhere classified: Secondary | ICD-10-CM

## 2020-07-29 ENCOUNTER — Ambulatory Visit: Payer: Medicaid Other | Admitting: Gastroenterology

## 2020-08-04 NOTE — Progress Notes (Signed)
See procedure note.

## 2020-08-05 ENCOUNTER — Ambulatory Visit (INDEPENDENT_AMBULATORY_CARE_PROVIDER_SITE_OTHER): Payer: Medicaid Other | Admitting: Gastroenterology

## 2020-08-05 ENCOUNTER — Encounter: Payer: Self-pay | Admitting: Gastroenterology

## 2020-08-05 DIAGNOSIS — K639 Disease of intestine, unspecified: Secondary | ICD-10-CM

## 2020-08-05 DIAGNOSIS — R159 Full incontinence of feces: Secondary | ICD-10-CM | POA: Diagnosis not present

## 2020-08-05 DIAGNOSIS — K219 Gastro-esophageal reflux disease without esophagitis: Secondary | ICD-10-CM

## 2020-08-05 MED ORDER — FAMOTIDINE 20 MG PO TABS: 20.0000 mg | ORAL_TABLET | Freq: Every day | ORAL | 3 refills | Status: DC

## 2020-08-05 MED ORDER — CITRUCEL PO POWD: 1.0000 | Freq: Every day | ORAL | Status: AC

## 2020-08-05 NOTE — Patient Instructions (Addendum)
If you are age 39 or younger, your body mass index should be between 19-25. Your Body mass index is 71.21 kg/m. If this is out of the aformentioned range listed, please consider follow up with your Primary Care Provider.   You have been scheduled for an endoscopy and colonoscopy. Please follow the written instructions given to you at your visit today. Please pick up your prep supplies at the pharmacy within the next 1-3 days. If you use inhalers (even only as needed), please bring them with you on the day of your procedure.  Due to recent changes in healthcare laws, you may see the results of your imaging and laboratory studies on MyChart before your provider has had a chance to review them.  We understand that in some cases there may be results that are confusing or concerning to you. Not all laboratory results come back in the same time frame and the provider may be waiting for multiple results in order to interpret others.  Please give [place] 48 hours in order for your provider to thoroughly review all the results before contacting the office for clarification of your results.   Please start taking citrucel (orange flavored) powder fiber supplement.  This may cause some bloating at first but that usually goes away. Begin with a small spoonful and work your way up to a large, heaping spoonful daily over a week.  We have sent the following medications to your pharmacy for you to pick up at your convenience:  CONTINUE: pepcid 20mg  take one tablet at bedtime each night.  Thank you for entrusting me with your care and choosing [facility].  Dr Ardis Hughs

## 2020-08-05 NOTE — Progress Notes (Signed)
Patient referred by Eldridge Abrahams, NP, seen by me on 06/30/20, HST on 07/29/20.    Please call and notify the patient that the recent home sleep test showed obstructive sleep apnea. OSA is overall mild, but worth treating to see if she feels better after treatment. To that end I recommend treatment for this in the form of autoPAP, which means, that we don't have to bring her in for a sleep study with CPAP, but will let her try an autoPAP machine at home, through a DME company (of her choice, or as per insurance requirement). The DME representative will educate her on how to use the machine, how to put the mask on, etc. I have placed an order in the chart. Please send referral, talk to patient, send report to referring MD. We will need a FU in sleep clinic for 10 weeks post-PAP set up, please arrange that with me or one of our NPs. Thanks,   Diana Age, MD, PhD [facility])

## 2020-08-05 NOTE — Procedures (Signed)
   [facility] ([facility])  STUDY DATE: 07/29/2020  DOB: 1980-05-31  MRN: [id number]  ORDERING CLINICIAN: Star Age, MD, PhD   REFERRING CLINICIAN: Berkley Harvey, NP   CLINICAL INFORMATION/HISTORY: 40 year old woman with a history of hypertension, diverticulitis, palpitations, PCOS, history of seizure, prediabetes, and severe obesity with BMI of over 60, who reports snoring and excessive daytime somnolence.    Epworth sleepiness score: N/A  BMI: 71.9 kg/m  Neck Circumference: 19-5/8"  FINDINGS:   Total Record Time (hours, min):  7 hrs  58mins  Total Sleep Time (hours, min):  6 hrs 54mins  Percent REM (%):    16.2  Calculated pAHI (per hour):  6.9      REM pAHI:   18.2    NREM pAHI:4.8 Supine AHI:7.1   Oxygen Saturation (%) Mean: 96  Minimum oxygen saturation (%):    89                O2 Saturation Range (%): 89 - 99  O2Saturation (minutes) <=88%     0.0   Pulse Mean (bpm):    72  Pulse Range (52 - 126)   IMPRESSION: OSA (obstructive sleep apnea), mild   RECOMMENDATION:  This home sleep test demonstrates overall mild obstructive sleep apnea with a total AHI of 6.9/hour and O2 nadir of 89%. Snoring was noted and appeared to be mild to moderate, intermittent. Given the patient's medical history and sleep related complaints, treatment with positive airway pressure is recommended. This can be achieved in the form of autoPAP trial/titration at home. A full night CPAP titration study will help with proper treatment settings and mask fitting if needed. Alternative treatments may include weight loss along with avoidance of the supine sleep position, or an oral appliance in appropriate candidates.   Please note that untreated obstructive sleep apnea may carry additional perioperative morbidity. Patients with significant obstructive sleep apnea should receive perioperative PAP therapy and the surgeons and particularly the anesthesiologist should be  informed of the diagnosis and the severity of the sleep disordered breathing. The patient should be cautioned not to drive, work at heights, or operate dangerous or heavy equipment when tired or sleepy. Review and reiteration of good sleep hygiene measures should be pursued with any patient. Other causes of the patient's symptoms, including circadian rhythm disturbances, an underlying mood disorder, medication effect and/or an underlying medical problem cannot be ruled out based on this test. Clinical correlation is recommended.   The patient and her referring provider will be notified of the test results. The patient will be seen in follow up in sleep clinic at [facility].  I certify that I have reviewed the raw data recording prior to the issuance of this report in accordance with the standards of the American Academy of Sleep Medicine (AASM).  INTERPRETING PHYSICIAN:  Star Age, MD, PhD  Board Certified in Neurology and Sleep Medicine  [facility] [address] [place], [place] [address] [phone]

## 2020-08-05 NOTE — Progress Notes (Signed)
HPI: This is a very pleasant 40 year old woman  I last saw her about 1 year ago as a new patient here in our office.  She had had upper and lower endoscopic evaluations with Dr. Deatra Ina in 2016, 2015.  These were both essentially normal.  We mainly discussed acid taste in her mouth clearing her throat, belching and choking sometimes on her saliva.  She was taking pantoprazole and Pepcid however not correctly.  I recommended she change the way she is taking her proton pump inhibitor so that she would take it 20 to 30 minutes prior to the only meal of the day which is usually in the afternoon hours, quite large meal.  I also recommended that she should start taking a Pepcid at bedtime every single night.  She underwent a barium esophagram June 2021 and it was normal.  Her weight is up 12 pounds since her last office visit here about 1 year ago, same scale.  Her current BMI is 69  Today she tells me that she has indeed been taking the Protonix 40 mg shortly before her first meal the day and Pepcid 20 mg at bedtime every night.  On this regimen she actually feels quite a bit better but she still has intermittent breakthrough GERD that is bothersome to her.  She sleeps sitting upright a bit.  She is also bothered by fecal soilage that generally follows just about every bowel movement.  She does not see blood in her stool.  She rarely has to push and strain to move her bowels.  ROS: complete GI ROS as described in HPI, all other review negative.  Constitutional:  No unintentional weight loss   Past Medical History:  Diagnosis Date  . 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 Diverticulitis   . Heart palpitations   . Morbid obesity with BMI of 50.0-59.9, adult (HCC)    BMI 58.8  . PCOS (polycystic ovarian syndrome)   . Seizure [facility])    as a child    Past Surgical History:  Procedure Laterality Date  . CERVICAL CERCLAGE  03/13/2012   Procedure: CERCLAGE  CERVICAL;  Surgeon: Jonnie Kind, MD;  Location: [facility];  Service: Gynecology;  Laterality: N/A;  . CERVICAL CERCLAGE  03/17/2012   Procedure: CERCLAGE CERVICAL;  Surgeon: Osborne Oman, MD;  Location: [facility];  Service: Gynecology;  Laterality: N/A;  Removal    Current Outpatient Medications  Medication Sig Dispense Refill  . Cholecalciferol (D3-1000) 25 MCG (1000 UT) capsule Take 1,000 Units by mouth daily.    . Cyanocobalamin (VITAMIN B-12 PO) Take by mouth.    . famotidine (PEPCID) 20 MG tablet Take 20 mg by mouth 2 (two) times daily.    . metroNIDAZOLE (METROGEL) 0.75 % vaginal gel Place 1 Applicatorful vaginally at bedtime. Apply one applicatorful to vagina at bedtime for 14 days 70 g 1  . pantoprazole (PROTONIX) 40 MG tablet Take 1 tablet (40 mg total) by mouth daily. 90 tablet 5   No current facility-administered medications for this visit.    Allergies as of 08/05/2020  . (No Known Allergies)    Family History  Problem Relation Age of Onset  . Cancer Mother        lung  . Hypertension Mother   . Diabetes Father   . Heart failure Father   . Diabetes Paternal Grandmother   . Hypertension Sister   . Hypertension Brother   . Rashes / Skin problems  Sister   . Other Neg Hx   . Colon cancer Neg Hx   . Pancreatic cancer Neg Hx   . Esophageal cancer Neg Hx     Social History   Socioeconomic History  . Marital status: Legally Separated    Spouse name: Not on file  . Number of children: 1  . Years of education: Not on file  . Highest education level: Not on file  Occupational History  . Not on file  Tobacco Use  . Smoking status: Former Smoker    Packs/day: 1.50    Years: 6.00    Pack years: 9.00    Types: Cigarettes    Quit date: 02/08/2009    Years since quitting: 11.4  . Smokeless tobacco: Never Used  Vaping Use  . Vaping Use: Never used  Substance and Sexual Activity  . Alcohol use: No  . Drug use: No  . Sexual activity: Yes    Birth  control/protection: None  Other Topics Concern  . Not on file  Social History Narrative  . Not on file   Social Determinants of Health   Financial Resource Strain: Not on file  Food Insecurity: No Food Insecurity  . Worried About Charity fundraiser in the Last Year: Never true  . Ran Out of Food in the Last Year: Never true  Transportation Needs: Not on file  Physical Activity: Not on file  Stress: Not on file  Social Connections: Not on file  Intimate Partner Violence: Not on file     Physical Exam: BP 128/88   Pulse 88   Ht 5\' 3"  (1.6 m)   Wt (!) 402 lb (182.3 kg)   SpO2 96%   BMI 71.21 kg/m  Constitutional: Morbidly obese Psychiatric: alert and oriented x3 Abdomen: soft, nontender, nondistended, no obvious ascites, no peritoneal signs, normal bowel sounds No peripheral edema noted in lower extremities  Assessment and plan: 39 y.o. female with fecal soilage after BMs, chronic GERD  She needed refills on her famotidine which I am happy to do, 20 mg pills 1 pill at bedtime every night, 3 months with 3 refills.  She will continue that nightly and her proton pump inhibitor before her breakfast, first meal the day.  Since she is still having some breakthrough symptoms I recommended EGD at her soonest convenience, certainly she might have a hiatal hernia.  Clearly her massive obesity could be playing a role in her GERD.  She has fecal incontinence, fecal soilage following most BMs.  I recommend a trial of fiber supplements with Citrucel as well as colonoscopy to evaluate for structural lesions, get a good examination of her anus, perianus.  I do have some question about whether her size might be playing a role in some of her fecal incontinence, fecal soilage.  Please see the "Patient Instructions" section for addition details about the plan.  Owens Loffler, MD [facility] 08/05/2020, 2:20 PM   Total time on date of encounter was 30 minutes (this included time  spent preparing to see the patient reviewing records; obtaining and/or reviewing separately obtained history; performing a medically appropriate exam and/or evaluation; counseling and educating the patient and family if present; ordering medications, tests or procedures if applicable; and documenting clinical information in the health record).

## 2020-08-05 NOTE — Addendum Note (Signed)
Addended by: Star Age on: 08/05/2020 05:27 PM   Modules accepted: Orders

## 2020-08-07 ENCOUNTER — Telehealth: Payer: Self-pay

## 2020-08-07 DIAGNOSIS — R0683 Snoring: Secondary | ICD-10-CM

## 2020-08-07 DIAGNOSIS — G4733 Obstructive sleep apnea (adult) (pediatric): Secondary | ICD-10-CM

## 2020-08-07 NOTE — Telephone Encounter (Signed)
I called pt. No answer, left a message asking pt to call me back.   

## 2020-08-07 NOTE — Addendum Note (Signed)
Addended by: Verlin Grills on: 08/07/2020 02:33 PM   Modules accepted: Orders

## 2020-08-07 NOTE — Telephone Encounter (Signed)
-----   Message from Star Age, MD sent at 08/05/2020  5:27 PM EDT ----- Patient referred by Eldridge Abrahams, NP, seen by me on 06/30/20, HST on 07/29/20.    Please call and notify the patient that the recent home sleep test showed obstructive sleep apnea. OSA is overall mild, but worth treating to see if she feels better after treatment. To that end I recommend treatment for this in the form of autoPAP, which means, that we don't have to bring her in for a sleep study with CPAP, but will let her try an autoPAP machine at home, through a DME company (of her choice, or as per insurance requirement). The DME representative will educate her on how to use the machine, how to put the mask on, etc. I have placed an order in the chart. Please send referral, talk to patient, send report to referring MD. We will need a FU in sleep clinic for 10 weeks post-PAP set up, please arrange that with me or one of our NPs. Thanks,   Star Age, MD, PhD [facility])

## 2020-08-07 NOTE — Telephone Encounter (Signed)
Pt called back and we discussed results of ss. Pt declines auto-pap therapy at this time and would like to pursue a dental device.  Referral has been placed to Dr. Corky Sing office and pt will CB if any issues arise.

## 2020-08-07 NOTE — Telephone Encounter (Signed)
Noted, thank you

## 2020-08-20 ENCOUNTER — Encounter: Payer: Self-pay | Admitting: Emergency Medicine

## 2020-08-20 ENCOUNTER — Other Ambulatory Visit: Payer: Self-pay

## 2020-08-20 ENCOUNTER — Ambulatory Visit (INDEPENDENT_AMBULATORY_CARE_PROVIDER_SITE_OTHER): Payer: Medicaid Other | Admitting: Emergency Medicine

## 2020-08-20 DIAGNOSIS — J301 Allergic rhinitis due to pollen: Secondary | ICD-10-CM | POA: Diagnosis not present

## 2020-08-20 DIAGNOSIS — R918 Other nonspecific abnormal finding of lung field: Secondary | ICD-10-CM

## 2020-08-20 DIAGNOSIS — K219 Gastro-esophageal reflux disease without esophagitis: Secondary | ICD-10-CM

## 2020-08-20 DIAGNOSIS — J309 Allergic rhinitis, unspecified: Secondary | ICD-10-CM | POA: Insufficient documentation

## 2020-08-20 DIAGNOSIS — R058 Other specified cough: Secondary | ICD-10-CM | POA: Diagnosis not present

## 2020-08-20 NOTE — Progress Notes (Signed)
Subjective:    Patient ID: Diana Blair, female    DOB: 08/05/1980, 40 y.o.   MRN: [id number]  HPI 40 year old former smoker with history of obesity, PCOS, diverticulitis, GERD. She has been seen by Dr. Melvyn Novas in our office for pulmonary infiltrates, dyspnea and suspected sarcoidosis.  She has elevated ACE level, most recently 112 on 11/26/2019.  Her most recent chest imaging was a chest x-ray 11/27/2019 which I have reviewed, shows patchy groundglass opacities and consolidation in the upper lobes. She has had palmar rash before, also on soles of her feet - resolved on its own. She has baseline dyspnea w exertion, with supine position. No wheeze or cough. She has generalized arthritic pain. Her last prednisone was 02/2019.     CT scan of the chest 02/21/2019 reviewed by me, shows bilateral focal opacities in her peribronchial, sometimes nodular masslike.  No effusions, no mediastinal hilar or axillary lymphadenopathy.  ROV 02/11/20 --40 year old woman who follows up today for suspected sarcoidosis.  She has had pulmonary infiltrates, waxing and waning typical rash.  An ACE level that was elevated.  We repeated her CT scan of the chest 01/10/2020 and I have reviewed, shows persistent patchy areas of interstitial thickening and regional nodular disease bilaterally, no adenopathy, splenomegaly. No real increase in infiltrates.  She is not really experiencing much in way of symptoms. Occasionally she gets UA throat irritation and some associated SOB, cough. Has been better since she has been on protonix / pepcid.   Pulmonary function testing done today reviewed by me, shows grossly normal airflows but with some curve her flow volume loop that could suggest mild obstruction.  No bronchodilator response.  There is intermittent irregularity of the inspiratory loop of her flow volume curve that could suggest upper airway instability.  Her lung volumes are normal, diffusion capacity is normal.  ROV 08/20/20  --40 year old woman with history of cough in the setting of GERD, also pulmonary infiltrates on chest x-ray with elevated ACE level all suggestive of sarcoidosis.  Her infiltrates have been stable and she is been asymptomatic.  She does not have a tissue diagnosis.  Next CT chest planned for 01/2021. She is on Protonix 40 mg daily and Pepcid 20 mg in the evening. Today she describes a lot of runny nose, going on for months. She was also having cough, some noise. Also some associated dyspnea.  She started zyrtec a few days ago and has benefited - less PND, less cough. She has itchy eyes. She is still seeing some thicker nasal mucous.  She saw some ear dryness and whiteness, possible crusted rash, ? Related to sarcoid. Resolved on its own. She notes that she used some new soaps, shampoos that may have triggered this and some of her other sx.    Review of Systems As per HPI      Objective:   Physical Exam Vitals:   08/20/20 1648  BP: 140/88  Pulse: 90  Temp: 98.1 F (36.7 C)  TempSrc: Temporal  SpO2: 98%  Weight: (!) 410 lb (186 kg)  Height: 5\' 3"  (1.6 m)   Gen: Pleasant, obese woman, in no distress,  normal affect  ENT: No lesions,  mouth clear,  oropharynx clear, no postnasal drip  Neck: No JVD, no stridor  Lungs: No use of accessory muscles, distant, no crackles or wheezing on normal respiration, no wheeze on forced expiration  Cardiovascular: RRR, heart sounds normal, no murmur or gallops, no peripheral edema  Musculoskeletal: No deformities, no  cyanosis or clubbing  Neuro: alert, awake, non focal  Skin: Warm, no lesions or rash      Assessment & Plan:  Upper airway cough syndrome GERD is well controlled.  She had some recent flaring due to apparent allergies.  Improved since she started Zyrtec.  I think we should add fluticasone nasal spray.  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  Nothing really pushing [place] to perform bronchoscopy or to attempt a tissue  diagnosis right now although we may ultimately need to do so.  Certainly if she developed rash we would perform a skin biopsy.  We are planning to repeat her CT chest in October.  This will guide [place] with regard to any need for bronchoscopy or therapy.  Pulmonary function testing overall reassuring but with some possible evidence for mild obstruction.  She has not required bronchodilator therapy.  Allergic rhinitis Increased sx over the last several months. Benefiting from zyrtec, plan to add fluticasone NS.   Gastroesophageal reflux disease Continue PPI + pepcid.   Baltazar Apo, MD, PhD 08/20/2020, 5:19 PM [facility] [phone] or if no answer [phone]

## 2020-08-20 NOTE — Assessment & Plan Note (Signed)
GERD is well controlled.  She had some recent flaring due to apparent allergies.  Improved since she started Zyrtec.  I think we should add fluticasone nasal spray.

## 2020-08-20 NOTE — Assessment & Plan Note (Addendum)
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  Nothing really pushing [place] to perform bronchoscopy or to attempt a tissue diagnosis right now although we may ultimately need to do so.  Certainly if she developed rash we would perform a skin biopsy.  We are planning to repeat her CT chest in October.  This will guide [place] with regard to any need for bronchoscopy or therapy.  Pulmonary function testing overall reassuring but with some possible evidence for mild obstruction.  She has not required bronchodilator therapy.

## 2020-08-20 NOTE — Assessment & Plan Note (Signed)
Increased sx over the last several months. Benefiting from zyrtec, plan to add fluticasone NS.

## 2020-08-20 NOTE — Assessment & Plan Note (Signed)
Continue PPI + pepcid.

## 2020-08-20 NOTE — Patient Instructions (Signed)
Please continue your Protonix and Pepcid as you have been taking them. Continue Zyrtec once daily Add fluticasone nasal spray, 2 sprays each nostril once daily We will plan to repeat your CT scan of the chest in October 2022 Call our office for any changes in your breathing, new rash or any other concerning findings Follow with Dr Lamonte Sakai in October to review your CT scan, or sooner if you have any problems.

## 2020-08-22 ENCOUNTER — Telehealth: Payer: Self-pay | Admitting: Emergency Medicine

## 2020-08-22 MED ORDER — FLUTICASONE PROPIONATE 50 MCG/ACT NA SUSP: 2.0000 | Freq: Every day | NASAL | 11 refills | Status: DC

## 2020-08-22 NOTE — Telephone Encounter (Signed)
flonase was sent  Pt aware

## 2020-08-26 ENCOUNTER — Telehealth: Payer: Self-pay

## 2020-08-26 NOTE — Telephone Encounter (Signed)
See telephone note from me

## 2020-08-26 NOTE — Telephone Encounter (Signed)
Dr. Corky Sing office has contacted pt stating they do not accept her insurance. Please send another referral to a different office that accepts her insurance. Also, if RN could give pt a call confirming that this has been handled. Thanks!!

## 2020-08-27 ENCOUNTER — Telehealth: Payer: Self-pay | Admitting: Neurology

## 2020-08-27 NOTE — Telephone Encounter (Signed)
[facility](Sarah) called, patient was referred by Dr. Rexene Alberts, there was no prescription for OAT. Our office Medicaid, but there is a copy of [employer] sent. Would like a call from the nurse

## 2020-08-27 NOTE — Telephone Encounter (Signed)
I have asked Lovena Le to help assist with this question.  She has been in touch with Dr. Kae Heller office and left a message for Judson Roch to give her a call back tomorrow.

## 2020-08-27 NOTE — Telephone Encounter (Signed)
Faxed referral to Dr. Ron Parker ph # [phone] & fax @ [phone].

## 2020-08-28 NOTE — Telephone Encounter (Signed)
Noted, thank you. I have the prescription for the pt's oral appliance on Dr. Tori Milks desk for signature.

## 2020-08-28 NOTE — Telephone Encounter (Signed)
I spoke with Judson Roch at Dr. Kae Heller office. She states that she will contact the patient to discuss the situation. She let me know that Medicaid will not pay for an oral appliance, she will explain this to patient and see if she wants to proceed with paying for it out of pocket.

## 2020-09-24 ENCOUNTER — Telehealth: Payer: Self-pay | Admitting: Gastroenterology

## 2020-09-24 NOTE — Progress Notes (Signed)
Attempt5ed to do prep phone call pt stated her procedure was supposed to be later than 1030.  Pt to call office due to the fact she cannot arrive before 1000 am for procedure.  PT to use [facility] Transportation for procedure.

## 2020-09-25 NOTE — Telephone Encounter (Signed)
Inbound call from patient. States she will not be able to be at the procedure at [facility] appointment until 9:30 on 10/02/2020. States [facility] told her that was not possible so she would need to change dates to accommodate her time. Best contact number [phone]

## 2020-09-25 NOTE — Telephone Encounter (Signed)
I spoke with the pt and she states she cannot make the appt by 9 am on 6/9.  I called [facility] endo and confirmed that she can arrive at 930 am (per Santiago Glad) and proceed as planned.  She will keep appt and call with any further concerns.

## 2020-09-29 ENCOUNTER — Other Ambulatory Visit (HOSPITAL_COMMUNITY): Payer: Medicaid Other

## 2020-10-02 ENCOUNTER — Ambulatory Visit (HOSPITAL_COMMUNITY): Payer: Medicaid Other | Admitting: Anesthesiology

## 2020-10-02 ENCOUNTER — Encounter (HOSPITAL_COMMUNITY): Payer: Self-pay | Admitting: Gastroenterology

## 2020-10-02 ENCOUNTER — Encounter (HOSPITAL_COMMUNITY)
Admission: RE | Disposition: A | Discharge status: Home / Self Care | Payer: Self-pay | Source: Home / Self Care | Attending: Gastroenterology

## 2020-10-02 ENCOUNTER — Other Ambulatory Visit: Payer: Self-pay

## 2020-10-02 ENCOUNTER — Ambulatory Visit (HOSPITAL_COMMUNITY)
Admission: RE | Admit: 2020-10-02 | Discharge: 2020-10-02 | Disposition: A | Discharge status: Home / Self Care | Payer: Medicaid Other | Attending: Gastroenterology | Admitting: Gastroenterology

## 2020-10-02 DIAGNOSIS — R159 Full incontinence of feces: Secondary | ICD-10-CM | POA: Diagnosis not present

## 2020-10-02 DIAGNOSIS — D120 Benign neoplasm of cecum: Secondary | ICD-10-CM | POA: Insufficient documentation

## 2020-10-02 DIAGNOSIS — Z87891 Personal history of nicotine dependence: Secondary | ICD-10-CM | POA: Diagnosis not present

## 2020-10-02 DIAGNOSIS — D124 Benign neoplasm of descending colon: Secondary | ICD-10-CM | POA: Insufficient documentation

## 2020-10-02 DIAGNOSIS — K219 Gastro-esophageal reflux disease without esophagitis: Secondary | ICD-10-CM

## 2020-10-02 DIAGNOSIS — Z6841 Body Mass Index (BMI) 40.0 and over, adult: Secondary | ICD-10-CM | POA: Diagnosis not present

## 2020-10-02 DIAGNOSIS — R12 Heartburn: Secondary | ICD-10-CM | POA: Diagnosis present

## 2020-10-02 DIAGNOSIS — K635 Polyp of colon: Secondary | ICD-10-CM | POA: Diagnosis not present

## 2020-10-02 DIAGNOSIS — K639 Disease of intestine, unspecified: Secondary | ICD-10-CM

## 2020-10-02 HISTORY — PX: COLONOSCOPY WITH PROPOFOL: SHX5780

## 2020-10-02 HISTORY — PX: POLYPECTOMY: SHX5525

## 2020-10-02 HISTORY — PX: ESOPHAGOGASTRODUODENOSCOPY (EGD) WITH PROPOFOL: SHX5813

## 2020-10-02 SURGERY — COLONOSCOPY WITH PROPOFOL
Anesthesia: Monitor Anesthesia Care

## 2020-10-02 MED ORDER — GLYCOPYRROLATE PF 0.2 MG/ML IJ SOSY
PREFILLED_SYRINGE | INTRAMUSCULAR | Status: DC | PRN
  Administered 2020-10-02: .4 mg via INTRAVENOUS

## 2020-10-02 MED ORDER — SODIUM CHLORIDE 0.9 % IV SOLN: INTRAVENOUS | Status: DC

## 2020-10-02 MED ORDER — LIDOCAINE 5 % EX OINT
TOPICAL_OINTMENT | Freq: Once | CUTANEOUS | Status: DC
  Filled 2020-10-02: qty 35.44

## 2020-10-02 MED ORDER — LACTATED RINGERS IV SOLN
INTRAVENOUS | Status: AC | PRN
  Administered 2020-10-02: 1000 mL via INTRAVENOUS

## 2020-10-02 MED ORDER — LIDOCAINE 2% (20 MG/ML) 5 ML SYRINGE
INTRAMUSCULAR | Status: DC | PRN
  Administered 2020-10-02: 100 mg via INTRAVENOUS

## 2020-10-02 MED ORDER — PROPOFOL 10 MG/ML IV BOLUS
INTRAVENOUS | Status: DC | PRN
  Administered 2020-10-02 (×5): 20 mg via INTRAVENOUS

## 2020-10-02 MED ORDER — PROPOFOL 500 MG/50ML IV EMUL
INTRAVENOUS | Status: DC | PRN
  Administered 2020-10-02: 100 ug/kg/min via INTRAVENOUS

## 2020-10-02 SURGICAL SUPPLY — 25 items

## 2020-10-02 NOTE — Transfer of Care (Signed)
Immediate Anesthesia Transfer of Care Note  Patient: Diana Blair  Procedure(s) Performed: COLONOSCOPY WITH PROPOFOL ESOPHAGOGASTRODUODENOSCOPY (EGD) WITH PROPOFOL POLYPECTOMY  Patient Location: Endoscopy Unit  Anesthesia Type:MAC  Level of Consciousness: awake  Airway & Oxygen Therapy: Patient Spontanous Breathing and Patient connected to face mask oxygen  Post-op Assessment: Report given to RN and Post -op Vital signs reviewed and stable  Post vital signs: Reviewed and stable  Last Vitals:  Vitals Value Taken Time  BP    Temp    Pulse    Resp    SpO2      Last Pain:  Vitals:   10/02/20 0958  TempSrc: Oral         Complications: No notable events documented.

## 2020-10-02 NOTE — Anesthesia Preprocedure Evaluation (Signed)
Anesthesia Evaluation  Patient identified by MRN, date of birth, ID band Patient awake    Reviewed: Allergy & Precautions, NPO status , Patient's Chart, lab work & pertinent test results  Airway Mallampati: IV  TM Distance: >3 FB Neck ROM: Full    Dental  (+) Teeth Intact   Pulmonary neg pulmonary ROS, former smoker,    Pulmonary exam normal        Cardiovascular hypertension,  Rhythm:Regular Rate:Normal     Neuro/Psych negative neurological ROS  negative psych ROS   GI/Hepatic Neg liver ROS, GERD  Medicated,Diverticulitis    Endo/Other  Morbid obesity  Renal/GU negative Renal ROS   PCOS    Musculoskeletal negative musculoskeletal ROS (+)   Abdominal (+)  Abdomen: soft. Bowel sounds: normal.  Peds  Hematology   Anesthesia Other Findings   Reproductive/Obstetrics                            Anesthesia Physical Anesthesia Plan  ASA: 3  Anesthesia Plan: MAC   Post-op Pain Management:    Induction: Intravenous  PONV Risk Score and Plan: 2 and Propofol infusion  Airway Management Planned: Simple Face Mask, Natural Airway and Nasal Cannula  Additional Equipment: None  Intra-op Plan:   Post-operative Plan:   Informed Consent: I have reviewed the patients History and Physical, chart, labs and discussed the procedure including the risks, benefits and alternatives for the proposed anesthesia with the patient or authorized representative who has indicated his/her understanding and acceptance.     Dental advisory given  Plan Discussed with: CRNA  Anesthesia Plan Comments:         Anesthesia Quick Evaluation

## 2020-10-02 NOTE — Op Note (Signed)
[facility] Patient Name: Diana Blair Procedure Date: 10/02/2020 MRN: [id number] Attending MD: Milus Banister , MD Date of Birth: [birth year] CSN: [id number] Age: 40 Admit Type: Outpatient Procedure:                Colonoscopy Indications:              Fecal incontinence Providers:                Milus Banister, MD, Dulcy Fanny, Cletis Athens, Technician, Danley Danker, CRNA Referring MD:              Medicines:                Monitored Anesthesia Care Complications:            No immediate complications. Estimated blood loss:                            None. Estimated Blood Loss:     Estimated blood loss: none. Procedure:                Pre-Anesthesia Assessment:                           - Prior to the procedure, a History and Physical                            was performed, and patient medications and                            allergies were reviewed. The patient's tolerance of                            previous anesthesia was also reviewed. The risks                            and benefits of the procedure and the sedation                            options and risks were discussed with the patient.                            All questions were answered, and informed consent                            was obtained. Prior Anticoagulants: The patient has                            taken no previous anticoagulant or antiplatelet                            agents. ASA Grade Assessment: IV - A patient with                            severe systemic  disease that is a constant threat                            to life. After reviewing the risks and benefits,                            the patient was deemed in satisfactory condition to                            undergo the procedure.                           After obtaining informed consent, the colonoscope                            was passed under direct vision. Throughout the                             procedure, the patient's blood pressure, pulse, and                            oxygen saturations were monitored continuously. The                            CF-HQ190L ([id number] Olympus colonoscope was                            introduced through the anus and advanced to the the                            cecum, identified by appendiceal orifice and                            ileocecal valve. The colonoscopy was performed                            without difficulty. The patient tolerated the                            procedure well. The quality of the bowel                            preparation was good. The ileocecal valve,                            appendiceal orifice, and rectum were photographed. Scope In: 10:26:46 AM Scope Out: 10:39:01 AM Scope Withdrawal Time: 0 hours 8 minutes 44 seconds  Total Procedure Duration: 0 hours 12 minutes 15 seconds  Findings:      Three sessile polyps were found in the descending colon, ascending colon       and cecum. The polyps were 3 to 7 mm in size. These polyps were removed       with a cold snare. Resection and retrieval were complete.      The exam was otherwise without abnormality on  direct and retroflexion       views. Impression:               - Three 3 to 7 mm polyps in the descending colon,                            in the ascending colon and in the cecum, removed                            with a cold snare. Resected and retrieved.                           - The examination was otherwise normal on direct                            and retroflexion views. Moderate Sedation:      Not Applicable - Patient had care per Anesthesia. Recommendation:           - EGD now.                           - Await pathology results. Procedure Code(s):        --- Professional ---                           [phone], Colonoscopy, flexible; with removal of                            tumor(s), polyp(s), or other lesion(s) by  snare                            technique Diagnosis Code(s):        --- Professional ---                           K63.5, Polyp of colon                           R15.9, Full incontinence of feces CPT copyright 2019 [employer]. All rights reserved. The codes documented in this report are preliminary and upon coder review may  be revised to meet current compliance requirements. Milus Banister, MD 10/02/2020 10:41:47 AM This report has been signed electronically. Number of Addenda: 0

## 2020-10-02 NOTE — Anesthesia Procedure Notes (Signed)
Procedure Name: MAC Date/Time: 10/02/2020 11:05 AM Performed by: Sharlette Dense, CRNA Oxygen Delivery Method: Simple face mask

## 2020-10-02 NOTE — Op Note (Signed)
[facility] Patient Name: Diana Blair Procedure Date: 10/02/2020 MRN: [id number] Attending MD: Milus Banister , MD Date of Birth: 08/11/1980 CSN: [id number] Age: 40 Admit Type: Outpatient Procedure:                Upper GI endoscopy Indications:              Heartburn Providers:                Milus Banister, MD, Dulcy Fanny, Cletis Athens, Technician, Danley Danker, CRNA Referring MD:              Medicines:                Monitored Anesthesia Care Complications:            No immediate complications. Estimated blood loss:                            None. Estimated Blood Loss:     Estimated blood loss: none. Procedure:                Pre-Anesthesia Assessment:                           - Prior to the procedure, a History and Physical                            was performed, and patient medications and                            allergies were reviewed. The patient's tolerance of                            previous anesthesia was also reviewed. The risks                            and benefits of the procedure and the sedation                            options and risks were discussed with the patient.                            All questions were answered, and informed consent                            was obtained. Prior Anticoagulants: The patient has                            taken no previous anticoagulant or antiplatelet                            agents. ASA Grade Assessment: IV - A patient with                            severe  systemic disease that is a constant threat                            to life. After reviewing the risks and benefits,                            the patient was deemed in satisfactory condition to                            undergo the procedure.                           After obtaining informed consent, the endoscope was                            passed under direct vision. Throughout the                             procedure, the patient's blood pressure, pulse, and                            oxygen saturations were monitored continuously. The                            [id number]-[id number] ([id number]) Olympus gastroscope was                            introduced through the mouth, and advanced to the                            second part of duodenum. The upper GI endoscopy was                            accomplished without difficulty. The patient                            tolerated the procedure well. Scope In: Scope Out: Findings:      The esophagus was normal.      The stomach was normal.      The examined duodenum was normal. Impression:               - Normal UGI tract. Moderate Sedation:      Not Applicable - Patient had care per Anesthesia. Recommendation:           - Patient has a contact number available for                            emergencies. The signs and symptoms of potential                            delayed complications were discussed with the                            patient. Return to normal activities tomorrow.  Written discharge instructions were provided to the                            patient.                           - Resume regular diet.                           - continue pepcid (famotidine) at bedtime and                            protonix (pantoprazole) shortly before your                            breakfast meal. Procedure Code(s):        --- Professional ---                           [phone], Esophagogastroduodenoscopy, flexible,                            transoral; diagnostic, including collection of                            specimen(s) by brushing or washing, when performed                            (separate procedure) Diagnosis Code(s):        --- Professional ---                           R12, Heartburn CPT copyright 2019 American Medical Association. All rights reserved. The codes documented in this report  are preliminary and upon coder review may  be revised to meet current compliance requirements. Milus Banister, MD 10/02/2020 10:55:48 AM This report has been signed electronically. Number of Addenda: 0

## 2020-10-02 NOTE — Anesthesia Postprocedure Evaluation (Signed)
Anesthesia Post Note  Patient: Diana Blair  Procedure(s) Performed: COLONOSCOPY WITH PROPOFOL ESOPHAGOGASTRODUODENOSCOPY (EGD) WITH PROPOFOL POLYPECTOMY     Patient location during evaluation: Endoscopy Anesthesia Type: MAC Level of consciousness: awake and alert Pain management: pain level controlled Vital Signs Assessment: post-procedure vital signs reviewed and stable Respiratory status: spontaneous breathing, nonlabored ventilation, respiratory function stable and patient connected to nasal cannula oxygen Cardiovascular status: stable and blood pressure returned to baseline Postop Assessment: no apparent nausea or vomiting Anesthetic complications: no   No notable events documented.  Last Vitals:  Vitals:   10/02/20 1100 10/02/20 1105  BP: (!) 148/75 (!) 148/75  Pulse: 91 91  Resp: (!) 24 20  Temp:  36.8 C  SpO2: 100% 100%    Last Pain:  Vitals:   10/02/20 1105  TempSrc: Oral  PainSc: 0-No pain                 Belenda Cruise P Kaizer Dissinger

## 2020-10-02 NOTE — H&P (Signed)
HPI: This is a woman with GERD, fecal incontinence, massive obesity  ROS: complete GI ROS as described in HPI, all other review negative.  Constitutional:  No unintentional weight loss   Past Medical History:  Diagnosis Date   Chronic hypertension in obstetric context in first trimester 05/04/2016   Diverticulitis    Heart palpitations    Morbid obesity with BMI of 50.0-59.9, adult (HCC)    BMI 58.8   PCOS (polycystic ovarian syndrome)    Seizure ([place])    as a child    Past Surgical History:  Procedure Laterality Date   CERVICAL CERCLAGE  03/13/2012   Procedure: CERCLAGE CERVICAL;  Surgeon: Jonnie Kind, MD;  Location: [facility];  Service: Gynecology;  Laterality: N/A;   CERVICAL CERCLAGE  03/17/2012   Procedure: CERCLAGE CERVICAL;  Surgeon: Osborne Oman, MD;  Location: [facility];  Service: Gynecology;  Laterality: N/A;  Removal    Current Facility-Administered Medications  Medication Dose Route Frequency Provider Last Rate Last Admin   lidocaine (XYLOCAINE) 5 % ointment   Topical Once [place], March Rummage, DO        Allergies as of 08/05/2020   (No Known Allergies)    Family History  Problem Relation Age of Onset   Cancer Mother        lung   Hypertension Mother    Diabetes Father    Heart failure Father    Diabetes Paternal Grandmother    Hypertension Sister    Hypertension Brother    Rashes / Skin problems Sister    Other Neg Hx    Colon cancer Neg Hx    Pancreatic cancer Neg Hx    Esophageal cancer Neg Hx     Social History   Socioeconomic History   Marital status: Legally Separated    Spouse name: Not on file   Number of children: 1   Years of education: Not on file   Highest education level: Not on file  Occupational History   Not on file  Tobacco Use   Smoking status: Former    Packs/day: 1.50    Years: 6.00    Pack years: 9.00    Types: Cigarettes    Quit date: 02/08/2009    Years since quitting: 11.6   Smokeless tobacco: Never  Vaping  Use   Vaping Use: Never used  Substance and Sexual Activity   Alcohol use: No   Drug use: No   Sexual activity: Yes    Birth control/protection: None  Other Topics Concern   Not on file  Social History Narrative   Not on file   Social Determinants of Health   Financial Resource Strain: Not on file  Food Insecurity: No Food Insecurity   Worried About Charity fundraiser in the Last Year: Never true   Ran Out of Food in the Last Year: Never true  Transportation Needs: Not on file  Physical Activity: Not on file  Stress: Not on file  Social Connections: Not on file  Intimate Partner Violence: Not on file     Physical Exam: There were no vitals taken for this visit. Constitutional: generally well-appearing Psychiatric: alert and oriented x3 Abdomen: soft, nontender, nondistended, no obvious ascites, no peritoneal signs, normal bowel sounds No peripheral edema noted in lower extremities  Assessment and plan: 40 y.o. female with GERD, fecal incontinence, massive obesity  For colonoscoyp and EGD today  Please see the "Patient Instructions" section for addition details about the plan.  Quillian Quince  Ardis Hughs, MD [facility] 10/02/2020, 9:46 AM

## 2020-10-03 ENCOUNTER — Encounter (HOSPITAL_COMMUNITY): Payer: Self-pay | Admitting: Gastroenterology

## 2020-10-03 LAB — SURGICAL PATHOLOGY

## 2020-10-29 ENCOUNTER — Other Ambulatory Visit: Payer: Self-pay | Admitting: Internal Medicine

## 2020-10-29 ENCOUNTER — Other Ambulatory Visit: Payer: Self-pay | Admitting: Gastroenterology

## 2020-11-19 ENCOUNTER — Encounter: Payer: Self-pay | Admitting: Neurology

## 2020-11-19 ENCOUNTER — Ambulatory Visit: Payer: Medicaid Other | Admitting: Neurology

## 2020-11-19 VITALS — BP 124/78 | HR 87 | Ht 63.0 in | Wt >= 6400 oz

## 2020-11-19 DIAGNOSIS — R202 Paresthesia of skin: Secondary | ICD-10-CM | POA: Diagnosis not present

## 2020-11-19 DIAGNOSIS — R29898 Other symptoms and signs involving the musculoskeletal system: Secondary | ICD-10-CM | POA: Diagnosis not present

## 2020-11-19 DIAGNOSIS — R200 Anesthesia of skin: Secondary | ICD-10-CM

## 2020-11-19 DIAGNOSIS — R419 Unspecified symptoms and signs involving cognitive functions and awareness: Secondary | ICD-10-CM | POA: Diagnosis not present

## 2020-11-19 NOTE — Progress Notes (Signed)
Subjective:    Patient ID: Diana Blair is a 40 y.o. female.  HPI    Diana Blair,  I saw your patient, Diana Blair, upon your kind request in my Neurology clinic today for initial consultation of her weakness.  The patient is accompanied by her young daughter today.  As you know, Diana Blair is a 40 year old right-handed woman with an underlying medical history of palpitations, diverticulitis, PCOS, remote history of seizure in childhood per chart review, and morbid obesity, who reports a several year history of numbness and tingling affecting both upper and both lower extremities as well as weakness primarily in both lower extremities, particularly in the right leg.  Symptoms are gradual in onset and have been progressive.  She has fallen in the past, most recently last year.  She has been using a cane since last year.  She reports pain in throughout her body.  She has been seen by pain management in the past and was suspected to have fibromyalgia.  She is currently not on any medication through pain management.  She has joint pain.  She has not seen orthopedics yet.  She has not seen rheumatology.  She reports that she has had an elevated inflammatory marker for years.  She reports a history of sarcoidosis and is followed by pulmonology for this.  She has not had a brain scan in the recent past.  She also reports difficulty with concentration, losing her train of thought, this has been ongoing for at least a month.  She is agreeable to focusing the visit on her numbness and weakness.  She has had urinary and bowel incontinence for several months.  She had a recent colonoscopy and they removed some polyps.  She has seen urology in the past and was told that she had urge incontinence.  She did not try the medication that was recommended at the time and she did not go back for  follow-up.  She is interested in seeing a different urologist.  I reviewed your office note from 10/16/2020. She had blood work recently through your office and I was able to review the results: On 10/16/2020 her CK level was mildly elevated at 423, A1c was elevated in the prediabetes range at 6.3, vitamin B12 was 540, rheumatoid arthritis screen was negative, vitamin D level was below normal at 25, CRP elevated at 28.8.    I have recently evaluated her for sleep apnea.  She had a home sleep test on 07/28/2020 which indicated mild obstructive sleep apnea with an AHI of 6.9/h, O2 nadir 89%.  She was offered AutoPap therapy but wanted to pursue an oral appliance.  She does not have an oral appliance at this time.  She reports weight gain over the past few months.  She feels like she is not able to be active.  She had physical therapy and aqua therapy was recommended.  She is currently not on any maintenance medication for her sarcoidosis.  She is supposed to have a repeat chest CT done in October 2022.  She has seen Dr. Lamonte Sakai most recently.  Previously:   06/30/20: Diana Blair is a 40 year old right-handed woman with an underlying medical history of hypertension, diverticulitis, palpitations, PCOS, history of seizure, prediabetes, and severe obesity with BMI of over 60, who reports snoring and excessive daytime somnolence.  I reviewed your office note from 04/07/2020.  Her  Epworth sleepiness score is but it is likely an underestimation as she does report dozing off during the day.  She is in online school full-time.  She does not have a set time to go to bed and has to be up around 7 to get her child to daycare.  She is single, she lives with her 39-year-old.  She reports that she does doze off before it is time to go to bed.  She has been recommended to have a sleep study by her urologist secondary to her nocturia.  She goes to the bathroom at night generally between 1 or 3 times.  She denies recurrent morning  headaches and is not aware of any sleep apnea diagnosis in her family.  She does not drink caffeine on a daily basis.  She quit smoking years ago.  She drinks alcohol rarely.  She would not be able to come in for sleep study as she does not have anybody to look after her child at night.  She does have some difficulty going to sleep at night.  In the past 2 years she reports that she has had more health problems.  She reports widespread joint pain and a diagnosis of sarcoidosis.  She has a history of snoring but is not aware of any pauses in her breathing, she does not wake up gasping for air. She has seen pain management who was suspected that she could have fibromyalgia.  Some medication was suggested but the patient declined the medication. Sometimes, she dozes off when her daughter is awake and daughter tends to open her cabinets and gets into things such as the salt jar.  She has made a mess with baking soda before.  Patient declines referral to social work to see if we can provide any additional resources through social work.      Her Past Medical History Is Significant For: Past Medical History:  Diagnosis Date   Chronic hypertension in obstetric context in first trimester 05/04/2016   Diverticulitis    Heart palpitations    Morbid obesity with BMI of 50.0-59.9, adult (HCC)    BMI 58.8   PCOS (polycystic ovarian syndrome)    Seizure ([place])    as a child    Her Past Surgical History Is Significant For: Past Surgical History:  Procedure Laterality Date   CERVICAL CERCLAGE  03/13/2012   Procedure: CERCLAGE CERVICAL;  Surgeon: Jonnie Kind, MD;  Location: [facility];  Service: Gynecology;  Laterality: N/A;   CERVICAL CERCLAGE  03/17/2012   Procedure: CERCLAGE CERVICAL;  Surgeon: Osborne Oman, MD;  Location: [facility];  Service: Gynecology;  Laterality: N/A;  Removal   COLONOSCOPY WITH PROPOFOL N/A 10/02/2020   Procedure: COLONOSCOPY WITH PROPOFOL;  Surgeon: Milus Banister, MD;  Location: [facility];  Service: Endoscopy;  Laterality: N/A;   ESOPHAGOGASTRODUODENOSCOPY (EGD) WITH PROPOFOL N/A 10/02/2020   Procedure: ESOPHAGOGASTRODUODENOSCOPY (EGD) WITH PROPOFOL;  Surgeon: Milus Banister, MD;  Location: [facility];  Service: Endoscopy;  Laterality: N/A;   POLYPECTOMY  10/02/2020   Procedure: POLYPECTOMY;  Surgeon: Milus Banister, MD;  Location: [facility];  Service: Endoscopy;;    Her Family History Is Significant For: Family History  Problem Relation Age of Onset   Cancer Mother        lung   Hypertension Mother    Diabetes Father    Heart failure Father    Diabetes Paternal Grandmother    Hypertension Sister    Hypertension Brother  Rashes / Skin problems Sister    Other Neg Hx    Colon cancer Neg Hx    Pancreatic cancer Neg Hx    Esophageal cancer Neg Hx     Her Social History Is Significant For: Social History   Socioeconomic History   Marital status: Legally Separated    Spouse name: Not on file   Number of children: 1   Years of education: Not on file   Highest education level: Not on file  Occupational History   Not on file  Tobacco Use   Smoking status: Former    Packs/day: 1.50    Years: 6.00    Pack years: 9.00    Types: Cigarettes    Quit date: 02/08/2009    Years since quitting: 11.7   Smokeless tobacco: Never  Vaping Use   Vaping Use: Never used  Substance and Sexual Activity   Alcohol use: No   Drug use: No   Sexual activity: Yes    Birth control/protection: None  Other Topics Concern   Not on file  Social History Narrative   Caffeine 2 soda in month. Sweet tea.  Education: Electronics engineer.  Working not now.     Social Determinants of Radio broadcast assistant Strain: Not on file  Food Insecurity: No Food Insecurity   Worried About Charity fundraiser in the Last Year: Never true   Ran Out of Food in the Last Year: Never true  Transportation Needs: Not on file  Physical Activity: Not on file  Stress: Not on file   Social Connections: Not on file    Her Allergies Are:  No Known Allergies:   Her Current Medications Are:  Outpatient Encounter Medications as of 11/19/2020  Medication Sig   Biotin w/ Vitamins C & E (HAIR/SKIN/NAILS) 1250-7.5-7.5 MCG-MG-UNT CHEW Chew by mouth.   cetirizine (ZYRTEC) 10 MG tablet Take 10 mg by mouth daily.   Cholecalciferol (D3-1000) 25 MCG (1000 UT) capsule Take 1,000 Units by mouth daily.   famotidine (PEPCID) 20 MG tablet Take 1 tablet (20 mg total) by mouth at bedtime.   fluticasone (FLONASE) 50 MCG/ACT nasal spray Place 2 sprays into both nostrils daily.   hydrochlorothiazide (HYDRODIURIL) 12.5 MG tablet Take one po every 2-3 days as needed for edema   methylcellulose (CITRUCEL) oral powder Take 1 packet by mouth daily.   pantoprazole (PROTONIX) 40 MG tablet TAKE 1 TABLET(40 MG) BY MOUTH DAILY   vitamin B-12 (CYANOCOBALAMIN) 1000 MCG tablet Take 1,000 mcg by mouth daily.   [DISCONTINUED] albuterol (PROVENTIL HFA;VENTOLIN HFA) 108 (90 Base) MCG/ACT inhaler Inhale 1-2 puffs into the lungs every 4 (four) hours as needed for wheezing or shortness of breath (or cough).   No facility-administered encounter medications on file as of 11/19/2020.  :   Review of Systems:  Out of a complete 14 point review of systems, all are reviewed and negative with the exception of these symptoms as listed below:   Review of Systems  Neurological:        LE muscle weakness, coordination off, using cane, c/o memory fog. Not using autopap or dental device (did not start)   Objective:  Neurological Exam  Physical Exam Physical Examination:   Vitals:   11/19/20 1049  BP: 124/78  Pulse: 87    General Examination: The patient is a very pleasant 40 y.o. female in no acute distress. She appears well-developed and well-nourished and well groomed.   HEENT: Normocephalic, atraumatic, pupils are equal, round  and reactive to light, extraocular tracking is good without limitation to gaze  excursion or nystagmus noted. Hearing is grossly intact. Face is symmetric with normal facial animation and normal sensation to light touch, pinprick, temperature and vibration.  Speech is clear without dysarthria, hypophonia or voice tremor.  Neck is supple without limitation in range of motion, no carotid bruits.  Airway examination reveals mild to moderate dryness.  Tongue protrudes centrally and palate elevates symmetrically.   Chest: Clear to auscultation without wheezing, rhonchi or crackles noted.   Heart: S1+S2+0, regular and normal without murmurs, rubs or gallops noted.   Abdomen: Soft, non-tender and non-distended.   Extremities: There is trace to 1+ pitting edema in the distal lower extremities bilaterally.   Skin: Warm and dry without trophic changes noted.   Musculoskeletal: exam reveals no obvious joint deformities, with the exception of right foot pointing outwards.  She has a limp on the right.    Neurologically: Mental status: The patient is awake, alert and oriented in all 4 spheres. Her immediate and remote memory, attention, language skills and fund of knowledge are appropriate. There is no evidence of aphasia, agnosia, apraxia or anomia. Speech is clear with normal prosody and enunciation. Thought process is linear. Mood is normal and affect is normal. Cranial nerves II - XII are as described above under HEENT exam. Motor exam: Normal bulk, strength is globally at least 4 out of 5.  No focal weakness, no focal atrophy, reflexes are difficult to elicit, trace in the upper extremities and absent in the lower extremities.  Toes are downgoing. Fine motor skills are grossly intact.  Sensory exam is intact to light touch, pinprick, temperature and vibration in the upper and lower extremities with the exception of decreased pinprick in both hands, distal to the wrists bilaterally.   Gait, station and balance: She stands with mild difficulty, she naturally stands wider base.  She  walks with a single-point cane with a limp on the right.  Reports leg pain on the right.     Assessment and Plan:    In summary, Diana Blair is a very pleasant 40 year old female with an underlying medical history of palpitations, diverticulitis, PCOS, remote history of seizure in childhood per chart review, and morbid obesity, who presents for evaluation of her numbness and tingling as well as weakness affecting all 4 extremities.  Symptoms are ongoing for at least 3 years, gradual in onset and progressive.  She also reports chronic pain affecting multiple areas in her body, as well as cognitive complaints, she has had urinary bowel incontinence but has not had further evaluation or treatment through urology.  I explained to her that it will be difficult to tie in all her symptoms underlying diagnosis, and underlying primary neurological diagnosis is difficult to ascertain at this time.  It is not impossible that she has some degree of neuropathy, risk factors would include prediabetes and obesity for this.  She had some recent blood work through your office last month and I would like to add some additional blood work including heavy metals, vitamin B1 and B6, HTLV, ANA, ESR.  Given her cognitive concerns and history of sarcoid doses or suspect that sarcoidosis, would like to proceed with a brain MRI with and without contrast.  We will do a CMP to make sure her kidney function is okay to safely proceed with a brain MRI with contrast.  In addition, I suggest we proceed with an EMG and nerve conduction velocity  test through our office to look for evidence of neuropathy versus radiculopathy versus compression neuropathy.  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  Exam is not telltale for a specific underlying cause, she does have difficulty with her walking but it could be tied in with body habitus and arthritis, chronic pain.  She is  advised that we will for now keep her posted as to her test results with phone call updates.  She is advised to talk to you about a referral to orthopedics for her right leg pain.  She also reports that she was born with knee deformities bilaterally and surgery was recommended when she was a child.  She is advised to talk to you about a referral to rheumatology given that she was suspected to have fibromyalgia in the past.  I answered all her questions today and she was in agreement with the above plan. Thank you very much for allowing me to participate in the care of this nice patient. If I can be of any further assistance to you please do not hesitate to call me at [phone].   Sincerely,     Diana Age, MD, PhD

## 2020-11-19 NOTE — Patient Instructions (Signed)
I am not sure how to tie in all your symptoms under 1 diagnosis.  It is possible that you have several separate problems.  Please continue to work with your primary care provider about seeing another urologist for urinary incontinence.  You may also benefit from seeing an orthopedic doctor for your right leg pain.  You may have developed arthritis.  One of your inflammatory markers called CRP was elevated.  We will do more blood work.  You have prediabetes and this can cause neuropathy or nerve damage over time.  Please continue to work with your primary care on weight loss and discussed with her options to pursue weight loss through a specialist.    History of sarcoidosis and your cognitive complaints, we will proceed with a brain MRI with and without contrast.  We will do an EMG and nerve conduction velocity test, which is an electrical nerve and muscle test, which we will schedule. We will call you with the results.

## 2020-11-20 ENCOUNTER — Telehealth: Payer: Self-pay | Admitting: Neurology

## 2020-11-20 NOTE — Telephone Encounter (Signed)
I see pt has been scheduled for EMG/NCS.

## 2020-11-20 NOTE — Telephone Encounter (Signed)
Pt is ready to schedule her NCS/EMG  but NCS/EMG is not as an Active Request on the appointment desk screen, please call pt.

## 2020-11-25 ENCOUNTER — Telehealth: Payer: Self-pay | Admitting: *Deleted

## 2020-11-25 LAB — HTLV I+II ANTIBODIES, (EIA), BLD: HTLV I/II Ab: NEGATIVE

## 2020-11-25 LAB — HEAVY METALS PROFILE II, BLOOD
Arsenic: 7 ug/L (ref 0–9)
Cadmium: <0.5 ug/L (ref 0.0–1.2)
Lead, Blood: <1 ug/dL (ref 0–4)
Mercury: 2.3 ug/L (ref 0.0–14.9)

## 2020-11-25 LAB — MULTIPLE MYELOMA PANEL, SERUM
Albumin SerPl Elph-Mcnc: 3.2 g/dL (ref 2.9–4.4)
Albumin/Glob SerPl: 1 (ref 0.7–1.7)
Alpha 1: 0.2 g/dL (ref 0.0–0.4)
Alpha2 Glob SerPl Elph-Mcnc: 0.5 g/dL (ref 0.4–1.0)
B-Globulin SerPl Elph-Mcnc: 1.3 g/dL (ref 0.7–1.3)
Gamma Glob SerPl Elph-Mcnc: 1.5 g/dL (ref 0.4–1.8)
Globulin, Total: 3.5 g/dL (ref 2.2–3.9)
IgA/Immunoglobulin A, Serum: 566 mg/dL — ABNORMAL HIGH (ref 87–352)
IgG (Immunoglobin G), Serum: 1441 mg/dL (ref 586–1602)
IgM (Immunoglobulin M), Srm: 143 mg/dL (ref 26–217)

## 2020-11-25 LAB — TSH: TSH: 1.09 u[IU]/mL (ref 0.450–4.500)

## 2020-11-25 LAB — ANA W/REFLEX: Anti Nuclear Antibody (ANA): NEGATIVE

## 2020-11-25 LAB — COMPREHENSIVE METABOLIC PANEL
ALT: 40 IU/L — ABNORMAL HIGH (ref 0–32)
AST: 36 IU/L (ref 0–40)
Albumin/Globulin Ratio: 1.3 (ref 1.2–2.2)
Albumin: 3.8 g/dL (ref 3.8–4.8)
Alkaline Phosphatase: 139 IU/L — ABNORMAL HIGH (ref 44–121)
BUN/Creatinine Ratio: 11 (ref 9–23)
BUN: 9 mg/dL (ref 6–20)
Bilirubin Total: <0.2 mg/dL (ref 0.0–1.2)
CO2: 24 mmol/L (ref 20–29)
Calcium: 9.1 mg/dL (ref 8.7–10.2)
Chloride: 103 mmol/L (ref 96–106)
Creatinine, Ser: 0.84 mg/dL (ref 0.57–1.00)
Globulin, Total: 2.9 g/dL (ref 1.5–4.5)
Glucose: 102 mg/dL — ABNORMAL HIGH (ref 65–99)
Potassium: 4.4 mmol/L (ref 3.5–5.2)
Sodium: 141 mmol/L (ref 134–144)
Total Protein: 6.7 g/dL (ref 6.0–8.5)
eGFR: 91 mL/min/{1.73_m2} (ref >59)

## 2020-11-25 LAB — VITAMIN B1: Thiamine: 105.6 nmol/L (ref 66.5–200.0)

## 2020-11-25 LAB — SEDIMENTATION RATE: Sed Rate: 45 mm/hr — ABNORMAL HIGH (ref 0–32)

## 2020-11-25 LAB — VITAMIN B6: Vitamin B6: 3 ug/L — ABNORMAL LOW (ref 3.4–65.2)

## 2020-11-25 LAB — RPR: RPR Ser Ql: NONREACTIVE

## 2020-11-25 NOTE — Telephone Encounter (Signed)
Pt returned call back and I relayed results as per Dr. Rexene Alberts.  Pt to see her pcp end of next month and can repeat labs then if so, (Dr. Rexene Alberts recommended 2-3 months).  Will let her know about pending lab results of abnormal.  Pt verbalized understanding. Pt will look to mychart relating to results.

## 2020-11-25 NOTE — Telephone Encounter (Signed)
I relayed to pt via VM Dr. Tori Milks result note.  Will fax to pcp lab results.  Pt to call back as needed.  Fax confirmation received Eldridge Abrahams, NP.

## 2020-11-25 NOTE — Telephone Encounter (Signed)
-----   Message from Star Age, MD sent at 11/25/2020 12:12 PM EDT ----- One of her immunoglobulins/antibodies in the blood called IgA is increased, this is a nonspecific result as well.  I would like for her to make a follow-up appointment with her primary care to discuss these findings and to see if further input from rheumatology or hematology specialty is recommended by primary care, she had reported joint pain to me as well.

## 2020-11-25 NOTE — Telephone Encounter (Signed)
I LMVM for pt to return call for lab results.

## 2020-11-25 NOTE — Telephone Encounter (Signed)
-----   Message from Diana Age, MD sent at 11/24/2020  5:35 PM EDT ----- Please call patient that her labs were largely benign.  There are a couple of smaller abnormalities.  Her vitamin B6 is slightly below normal, I recommend that she supplement with an over-the-counter vitamin B6 supplement and follow the instructions for the over-the-counter vitamin B6 daily recommendation.  A couple of her liver enzymes were mildly above the normal range, this should be something monitored by her primary care.  I would recommend recheck in the next 2 to 3 months through primary care.  In addition, one of the inflammation markers called sedimentation rate was elevated.  It is actually not as high as it was in the past.  This is a nonspecific marker for infection or inflammation and mildly elevated in her case.  This can be from an underlying arthritis or even from underlying sarcoidosis.  Recheck with her primary care in the next 2 to 3 months would be appropriate for this as well.  One of the blood tests are pending which is the protein breakdown in her blood.  We will update if anything comes back abnormal.  As discussed, we will proceed with a brain scan and the electrical nerve and muscle testing.

## 2020-11-27 ENCOUNTER — Telehealth: Payer: Self-pay | Admitting: Gastroenterology

## 2020-11-27 NOTE — Telephone Encounter (Signed)
The pt would like Dr Ardis Hughs to review her labs that her PCP ordered.  She is aware that Dr Ardis Hughs wil be back next week. Please see in EPIC

## 2020-11-27 NOTE — Telephone Encounter (Signed)
Inbound call from patient requesting a call please.  States had some labs done with PCP and was informed that she had elevated liver enzymes.  Wanted to see if Dr. Ardis Hughs could review and advise if next steps.

## 2020-12-01 NOTE — Telephone Encounter (Signed)
She has slightly elevated liver test.  This is very most likely from her morbid obesity, she has a BMI of around 70.  I am happy to discuss this with her further in the office.  Please offer her my first available office appointment, do not double book and do not book with an extender.  Thank you

## 2020-12-01 NOTE — Telephone Encounter (Signed)
The pt has been advised and states she is currently following with her PCP in regards to her elevated liver test.  She will call back if her PCP feels she needs to be seen by GI.  PCP is following up with more labs later in the month.

## 2020-12-02 ENCOUNTER — Ambulatory Visit
Admission: RE | Admit: 2020-12-02 | Discharge: 2020-12-02 | Disposition: A | Discharge status: Home / Self Care | Payer: Medicaid Other | Source: Ambulatory Visit | Attending: Neurology | Admitting: Neurology

## 2020-12-02 ENCOUNTER — Telehealth: Payer: Self-pay | Admitting: Hematology and Oncology

## 2020-12-02 ENCOUNTER — Other Ambulatory Visit: Payer: Self-pay | Admitting: Neurology

## 2020-12-02 ENCOUNTER — Other Ambulatory Visit: Payer: Self-pay

## 2020-12-02 DIAGNOSIS — R202 Paresthesia of skin: Secondary | ICD-10-CM

## 2020-12-02 DIAGNOSIS — R419 Unspecified symptoms and signs involving cognitive functions and awareness: Secondary | ICD-10-CM

## 2020-12-02 DIAGNOSIS — R29898 Other symptoms and signs involving the musculoskeletal system: Secondary | ICD-10-CM

## 2020-12-02 DIAGNOSIS — R200 Anesthesia of skin: Secondary | ICD-10-CM | POA: Diagnosis not present

## 2020-12-02 NOTE — Telephone Encounter (Signed)
Received a new hem referral from [place]. [name] has been cld and scheduled to see Dr. Lorenso Courier on 8/22 at 220pm. Pt aware to arrive 20 minutes early.

## 2020-12-04 ENCOUNTER — Encounter: Payer: Self-pay | Admitting: Neurology

## 2020-12-04 NOTE — Progress Notes (Signed)
Unremarkable MRI brain without contrast.  No acute findings.

## 2020-12-09 DIAGNOSIS — G4730 Sleep apnea, unspecified: Secondary | ICD-10-CM | POA: Insufficient documentation

## 2020-12-11 ENCOUNTER — Other Ambulatory Visit: Payer: Self-pay

## 2020-12-11 ENCOUNTER — Ambulatory Visit: Payer: Medicaid Other | Admitting: Diagnostic Neuroimaging

## 2020-12-11 ENCOUNTER — Encounter (INDEPENDENT_AMBULATORY_CARE_PROVIDER_SITE_OTHER): Payer: Medicaid Other | Admitting: Diagnostic Neuroimaging

## 2020-12-11 DIAGNOSIS — R419 Unspecified symptoms and signs involving cognitive functions and awareness: Secondary | ICD-10-CM

## 2020-12-11 DIAGNOSIS — R29898 Other symptoms and signs involving the musculoskeletal system: Secondary | ICD-10-CM

## 2020-12-11 DIAGNOSIS — Z0289 Encounter for other administrative examinations: Secondary | ICD-10-CM

## 2020-12-11 DIAGNOSIS — R200 Anesthesia of skin: Secondary | ICD-10-CM | POA: Diagnosis not present

## 2020-12-11 DIAGNOSIS — R202 Paresthesia of skin: Secondary | ICD-10-CM | POA: Diagnosis not present

## 2020-12-11 NOTE — Procedures (Signed)
[facility]  NCS (NERVE CONDUCTION STUDY) WITH EMG (ELECTROMYOGRAPHY) REPORT   STUDY DATE: 12/11/20 PATIENT NAME: Diana Blair DOB: [birth year] MRN: [id number]  ORDERING CLINICIAN: Star Age, MD PhD   TECHNOLOGIST: Sherre Scarlet ELECTROMYOGRAPHER: Earlean Polka. Esmerelda Finnigan, MD  CLINICAL INFORMATION: 40 year old female with intermittent numbness in hands and feet.  FINDINGS: NERVE CONDUCTION STUDY:  Right median and right ulnar motor responses are normal.  Right median and right ulnar sensory responses are normal.  Right peroneal and right tibial motor responses could not be obtained.  Right sural and right superficial peroneal sensory responses could not be obtained.  Right tibia and right ulnar F-wave latencies are normal.   NEEDLE ELECTROMYOGRAPHY:  Limited needle examination of right upper and lower extremities is normal.   IMPRESSION:   This study demonstrates: -Normal EMG and nerve conduction study of right upper extremity. -Right lower extremity nerve conduction responses could not be obtained.  This may be due to axonal neuropathy versus technical limitations related to patient's body habitus.    INTERPRETING PHYSICIAN:  Penni Bombard, MD Certified in Neurology, Neurophysiology and Neuroimaging  [facility] [address], [place], [place] [address] [phone]   [facility]    Nerve / Sites Muscle Latency Ref. Amplitude Ref. Rel Amp Segments Distance Velocity Ref. Area    ms ms mV mV %  cm m/s m/s mVms  R Median - APB     Wrist APB 3.5 ?4.4 16.9 ?4.0 100 Wrist - APB 7   46.0     Upper arm APB 6.8  16.6  98 Upper arm - Wrist 21 63 ?49 45.7  R Ulnar - ADM     Wrist ADM 2.3 ?3.3 9.5 ?6.0 100 Wrist - ADM 7   30.8     B.Elbow ADM 5.1  8.5  89 B.Elbow - Wrist 20 72 ?49 28.5     A.Elbow ADM 6.5  8.4  98.9 A.Elbow - B.Elbow 10 71 ?49 28.2  R Peroneal - EDB     Ankle EDB 3.9 ?6.5 3.7 ?2.0 100 Ankle - EDB 9   9.8     Fib  head EDB NR  NR  NR Fib head - Ankle 26 NR ?44 NR     Pop fossa EDB 9.8  3.6   Pop fossa - Fib head 10 NR ?44 9.8         Pop fossa - Ankle      R Tibial - AH     Ankle AH 2.8 ?5.8 7.9 ?4.0 100 Ankle - AH 9   19.0     Pop fossa AH NR  NR  NR Pop fossa - Ankle 33 NR ?41 NR             SNC    Nerve / Sites Rec. Site Peak Lat Ref.  Amp Ref. Segments Distance    ms ms V V  cm  R Sural - Ankle (Calf)     Calf Ankle NR ?4.4 NR ?6 Calf - Ankle 14  R Superficial peroneal - Ankle     Lat leg Ankle NR ?4.4 NR ?6 Lat leg - Ankle 14  R Median - Orthodromic (Dig II, Mid palm)     Dig II Wrist 3.3 ?3.4 13 ?10 Dig II - Wrist 13  R Ulnar - Orthodromic, (Dig V, Mid palm)     Dig V Wrist 2.2 ?3.1 6 ?5 Dig V - Wrist 11  F  Wave    Nerve F Lat Ref.   ms ms  R Tibial - AH 42.2 ?56.0  R Ulnar - ADM 24.9 ?32.0         EMG Summary Table    Spontaneous MUAP Recruitment  Muscle IA Fib PSW Fasc Other Amp Dur. Poly Pattern  R. Deltoid Normal None None None _______ Normal Normal Normal Normal  R. Flexor carpi radialis Normal None None None _______ Normal Normal Normal Normal  R. First dorsal interosseous Normal None None None _______ Normal Normal Normal Normal  R. Tibialis anterior Normal None None None _______ Normal Normal Normal Normal

## 2020-12-15 ENCOUNTER — Encounter: Payer: Medicaid Other | Admitting: Hematology and Oncology

## 2020-12-15 ENCOUNTER — Other Ambulatory Visit: Payer: Medicaid Other

## 2020-12-30 NOTE — Progress Notes (Signed)
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 No M protein detected, though reported to have polyclonal increase in one or more immunoglobulins. Noted to have IgA of 566 (nml 87-352)  12/31/2020: establish care with Dr. 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 female with medical history significant for PCOS, morbid obesity, and diverticulitis who presents for evaluation of a polyclonal increase in immunoglobulins.   On review of the previous records Mrs. Hands underwent neurological evaluation on 11/19/2020 due to weakness.  As part of that evaluation she had a multiple myeloma panel drawn which showed no M protein, however her IgA was elevated at 566.  Due to this polyclonal increase in 1 or more immunoglobulins the patient was referred to hematology for further evaluation and management  On exam today Diana Blair has had a rough couple years.  She notes that her symptoms began in 2020 when she became "sicker and sicker and sicker due to a pneumonia ".  She notes that she saw pulmonary doctor with time thought that she was suffering from sarcoidosis.  She reports that for the last year and a half she has had progressively worsening symptoms such as falling down, weakness, pain in her hands and legs and "hurting all over".  She does that she received a diagnosis of fibromyalgia.  She was recommended to start physical therapy but has had difficulty with this.  She is also unfortunately developed urinary and fecal incontinence.  She notes that in February she did not require cane but now she is  walking with a cane and borderline requires a Diana.  She also.  If he develops rashes which develop on her hands front and back as well as her eyes.  On further discussion she notes that she is a former smoker having quit in 2013.  She does not currently drink alcohol and previously worked in Scientist, research (medical).  Her family history is remarkable for lung cancer in her mother.  She has multiple siblings were quite healthy.  She has her father had heart disease and type 2 diabetes.  She denies having any burning or burning with her urination.  She notes that she does see some occasional blood in the stool that her menstrual cycles are "off".  She otherwise denies any fevers, chills, sweats, nausea, vomiting or diarrhea.  Full 10 point ROS is listed below.  MEDICAL HISTORY:  Past Medical History:  Diagnosis Date   Chronic hypertension in obstetric context in first trimester 05/04/2016   Diverticulitis    Heart palpitations    Morbid obesity with BMI of 50.0-59.9, adult (HCC)    BMI 58.8   PCOS (polycystic ovarian syndrome)    Seizure ([place])    as a child    SURGICAL HISTORY: Past Surgical History:  Procedure Laterality Date   CERVICAL CERCLAGE  03/13/2012   Procedure: CERCLAGE CERVICAL;  Surgeon: Jonnie Kind, MD;  Location: [facility];  Service: Gynecology;  Laterality: N/A;   CERVICAL CERCLAGE  03/17/2012   Procedure: CERCLAGE CERVICAL;  Surgeon: Osborne Oman, MD;  Location: [facility];  Service: Gynecology;  Laterality: N/A;  Removal   COLONOSCOPY WITH PROPOFOL N/A 10/02/2020  Procedure: COLONOSCOPY WITH PROPOFOL;  Surgeon: Milus Banister, MD;  Location: [facility];  Service: Endoscopy;  Laterality: N/A;   ESOPHAGOGASTRODUODENOSCOPY (EGD) WITH PROPOFOL N/A 10/02/2020   Procedure: ESOPHAGOGASTRODUODENOSCOPY (EGD) WITH PROPOFOL;  Surgeon: Milus Banister, MD;  Location: [facility];  Service: Endoscopy;  Laterality: N/A;   POLYPECTOMY  10/02/2020   Procedure: POLYPECTOMY;  Surgeon: Milus Banister,  MD;  Location: [facility];  Service: Endoscopy;;    SOCIAL HISTORY: Social History   Socioeconomic History   Marital status: Legally Separated    Spouse name: Not on file   Number of children: 1   Years of education: Not on file   Highest education level: Not on file  Occupational History   Not on file  Tobacco Use   Smoking status: Former    Packs/day: 1.50    Years: 6.00    Pack years: 9.00    Types: Cigarettes    Quit date: 02/08/2009    Years since quitting: 11.9   Smokeless tobacco: Never  Vaping Use   Vaping Use: Never used  Substance and Sexual Activity   Alcohol use: No   Drug use: No   Sexual activity: Yes    Birth control/protection: None  Other Topics Concern   Not on file  Social History Narrative   Caffeine 2 soda in month. Sweet tea.  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  Single parent.    Social Determinants of Radio broadcast assistant Strain: Not on file  Food Insecurity: No Food Insecurity   Worried About Charity fundraiser in the Last Year: Never true   Arboriculturist in the Last Year: Never true  Transportation Needs: Not on file  Physical Activity: Not on file  Stress: Not on file  Social Connections: Not on file  Intimate Partner Violence: Not on file    FAMILY HISTORY: Family History  Problem Relation Age of Onset   Cancer Mother        lung   Hypertension Mother    Diabetes Father    Heart failure Father    Diabetes Paternal Grandmother    Hypertension Sister    Hypertension Brother    Rashes / Skin problems Sister    Other Neg Hx    Colon cancer Neg Hx    Pancreatic cancer Neg Hx    Esophageal cancer Neg Hx     ALLERGIES:  has No Known Allergies.  MEDICATIONS:  Current Outpatient Medications  Medication Sig Dispense Refill   Pyridoxine HCl (VITAMIN B6) 100 MG TABS      Biotin w/ Vitamins C & E (HAIR/SKIN/NAILS) 1250-7.5-7.5 MCG-MG-UNT CHEW Chew by mouth.     celecoxib  (CELEBREX) 100 MG capsule Take 100 mg by mouth 2 (two) times daily.     cetirizine (ZYRTEC) 10 MG tablet Take 10 mg by mouth daily.     Cholecalciferol (D3-1000) 25 MCG (1000 UT) capsule Take 1,000 Units by mouth daily.     famotidine (PEPCID) 20 MG tablet Take 1 tablet (20 mg total) by mouth at bedtime. 90 tablet 3   fluticasone (FLONASE) 50 MCG/ACT nasal spray Place 2 sprays into both nostrils daily. 16 g 11   hydrochlorothiazide (HYDRODIURIL) 12.5 MG tablet Take one po every 2-3 days as needed for edema     methylcellulose (CITRUCEL) oral powder Take 1 packet by mouth daily.     pantoprazole (PROTONIX) 40 MG tablet TAKE 1 TABLET(40 MG) BY  MOUTH DAILY 90 tablet 3   vitamin B-12 (CYANOCOBALAMIN) 1000 MCG tablet Take 1,000 mcg by mouth daily.     No current facility-administered medications for this visit.    REVIEW OF SYSTEMS:   Constitutional: ( - ) fevers, ( - )  chills , ( - ) night sweats Eyes: ( - ) blurriness of vision, ( - ) double vision, ( - ) watery eyes Ears, nose, mouth, throat, and face: ( - ) mucositis, ( - ) sore throat Respiratory: ( - ) cough, ( - ) dyspnea, ( - ) wheezes Cardiovascular: ( - ) palpitation, ( - ) chest discomfort, ( - ) lower extremity swelling Gastrointestinal:  ( - ) nausea, ( - ) heartburn, ( - ) change in bowel habits Skin: ( - ) abnormal skin rashes Lymphatics: ( - ) new lymphadenopathy, ( - ) easy bruising Neurological: ( - ) numbness, ( - ) tingling, ( - ) new weaknesses Behavioral/Psych: ( - ) mood change, ( - ) new changes  All other systems were reviewed with the patient and are negative.  PHYSICAL EXAMINATION: ECOG PERFORMANCE STATUS: 3 - Symptomatic, >50% confined to bed  Vitals:   12/31/20 0925  BP: 128/84  Pulse: 73  Resp: 18  Temp: 98.2 F (36.8 C)  SpO2: 100%   Filed Weights   12/31/20 0925  Weight: (!) 406 lb 6.4 oz (184.3 kg)    GENERAL: well appearing middle-aged African-American female, obese, in NAD  SKIN: skin color,  texture, turgor are normal, no rashes or significant lesions EYES: conjunctiva are pink and non-injected, sclera clear LUNGS: clear to auscultation and percussion with normal breathing effort HEART: regular rate & rhythm and no murmurs and no lower extremity edema Musculoskeletal: no cyanosis of digits and no clubbing  PSYCH: alert & oriented x 3, fluent speech NEURO: no focal motor/sensory deficits  LABORATORY DATA:  I have reviewed the data as listed CBC Latest Ref Rng & Units 12/31/2020 11/26/2019 02/26/2019  WBC 4.0 - 10.5 K/uL 5.7 5.7 6.5  Hemoglobin 12.0 - 15.0 g/dL 12.2 12.4 12.0  Hematocrit 36.0 - 46.0 % 38.1 38.3 37.6  Platelets 150 - 400 K/uL 286 257.0 275.0    CMP Latest Ref Rng & Units 12/31/2020 11/19/2020 11/26/2019  Glucose 70 - 99 mg/dL 115(H) 102(H) -  BUN 6 - 20 mg/dL 12 9 -  Creatinine 0.44 - 1.00 mg/dL 0.98 0.84 -  Sodium 135 - 145 mmol/L 140 141 -  Potassium 3.5 - 5.1 mmol/L 4.2 4.4 -  Chloride 98 - 111 mmol/L 105 103 -  CO2 22 - 32 mmol/L 24 24 -  Calcium 8.9 - 10.3 mg/dL 9.8 9.1 -  Total Protein 6.5 - 8.1 g/dL 8.0 6.7 8.0  Total Bilirubin 0.3 - 1.2 mg/dL 0.3 <0.2 0.2  Alkaline Phos 38 - 126 U/L 128(H) 139(H) 114  AST 15 - 41 U/L 33 36 32  ALT 0 - 44 U/L 36 40(H) 29   RADIOGRAPHIC STUDIES: I have personally reviewed the radiological images as listed and agreed with the findings in the report. MR BRAIN WO CONTRAST  Result Date: 12/03/2020 [facility] NEUROIMAGING REPORT STUDY DATE: 12/02/20 PATIENT NAME: Diana Blair DOB: 02-05-1981 MRN: [id number] ORDERING CLINICIAN: Star Age, MD CLINICAL HISTORY: 40 year old female with numbness and brain fog. EXAM: MR BRAIN WO CONTRAST TECHNIQUE: MRI of the brain with and without contrast was obtained utilizing 5 mm axial slices with T1, T2, T2 flair, SWI and diffusion weighted  views.  T1 sagittal, T2 coronal and postcontrast views in the axial and coronal plane were obtained. CONTRAST: 32ml multihance  COMPARISON: none IMAGING SITE: [facility] [address] (1.5 Tesla MRI)  FINDINGS: No abnormal lesions are seen on diffusion-weighted views to suggest acute ischemia. The cortical sulci, fissures and cisterns are normal in size and appearance. Lateral, third and fourth ventricle are normal in size and appearance. No extra-axial fluid collections are seen. No evidence of mass effect or midline shift.  Few minimal punctate subcortical foci of nonspecific gliosis.  No abnormal lesions are seen on post contrast views.  On sagittal views the posterior fossa, pituitary gland and corpus callosum are notable for enlarged partially empty sella. No evidence of intracranial hemorrhage on SWI views. The orbits and their contents, paranasal sinuses and calvarium are unremarkable.  Intracranial flow voids are present.   Unremarkable MRI brain without contrast.  No acute findings. INTERPRETING PHYSICIAN: Penni Bombard, MD Certified in Neurology, Neurophysiology and Neuroimaging [facility] [address], [place], [place] [address] [phone]   NCV with EMG(electromyography)  Result Date: 12/11/2020 Penni Bombard, MD     12/11/2020  3:44 PM  [facility] NCS (NERVE CONDUCTION STUDY) WITH EMG (ELECTROMYOGRAPHY) REPORT STUDY DATE: 12/11/20 PATIENT NAME: Diana Blair DOB: 01/04/1981 MRN: [id number] ORDERING CLINICIAN: Star Age, MD PhD TECHNOLOGIST: Sherre Scarlet ELECTROMYOGRAPHER: Earlean Polka. Penumalli, MD CLINICAL INFORMATION: 40 year old female with intermittent numbness in hands and feet. FINDINGS: NERVE CONDUCTION STUDY: Right median and right ulnar motor responses are normal.  Right median and right ulnar sensory responses are normal. Right peroneal and right tibial motor responses could not be obtained.  Right sural and right superficial peroneal sensory responses could not be obtained. Right tibia and right ulnar F-wave latencies are normal. NEEDLE  ELECTROMYOGRAPHY: Limited needle examination of right upper and lower extremities is normal. IMPRESSION: This study demonstrates: -Normal EMG and nerve conduction study of right upper extremity. -Right lower extremity nerve conduction responses could not be obtained.  This may be due to axonal neuropathy versus technical limitations related to patient's body habitus. INTERPRETING PHYSICIAN: Penni Bombard, MD Certified in Neurology, Neurophysiology and Neuroimaging [facility] [address] [place], [place] [address] [phone] [facility]   Nerve / Sites Muscle Latency Ref. Amplitude Ref. Rel Amp Segments Distance Velocity Ref. Area   ms ms mV mV %  cm m/s m/s mVms R Median - APB    Wrist APB 3.5 ?4.4 16.9 ?4.0 100 Wrist - APB 7   46.0    Upper arm APB 6.8  16.6  98 Upper arm - Wrist 21 63 ?49 45.7 R Ulnar - ADM    Wrist ADM 2.3 ?3.3 9.5 ?6.0 100 Wrist - ADM 7   30.8    B.Elbow ADM 5.1  8.5  89 B.Elbow - Wrist 20 72 ?49 28.5    A.Elbow ADM 6.5  8.4  98.9 A.Elbow - B.Elbow 10 71 ?49 28.2 R Peroneal - EDB    Ankle EDB 3.9 ?6.5 3.7 ?2.0 100 Ankle - EDB 9   9.8    Fib head EDB NR  NR  NR Fib head - Ankle 26 NR ?44 NR    Pop fossa EDB 9.8  3.6   Pop fossa - Fib head 10 NR ?44 9.8        Pop fossa - Ankle     R Tibial - AH    Ankle AH  2.8 ?5.8 7.9 ?4.0 100 Ankle - AH 9   19.0    Pop fossa AH NR  NR  NR Pop fossa - Ankle 33 NR ?41 NR           SNC   Nerve / Sites Rec. Site Peak Lat Ref.  Amp Ref. Segments Distance   ms ms V V  cm R Sural - Ankle (Calf)    Calf Ankle NR ?4.4 NR ?6 Calf - Ankle 14 R Superficial peroneal - Ankle    Lat leg Ankle NR ?4.4 NR ?6 Lat leg - Ankle 14 R Median - Orthodromic (Dig II, Mid palm)    Dig II Wrist 3.3 ?3.4 13 ?10 Dig II - Wrist 13 R Ulnar - Orthodromic, (Dig V, Mid palm)    Dig V Wrist 2.2 ?3.1 6 ?5 Dig V - Wrist 29           F  Wave   Nerve F Lat Ref.  ms ms R Tibial - AH 42.2 ?56.0 R Ulnar - ADM 24.9 ?32.0       EMG Summary Table   Spontaneous MUAP  Recruitment Muscle IA Fib PSW Fasc Other Amp Dur. Poly Pattern R. Deltoid Normal None None None _______ Normal Normal Normal Normal R. Flexor carpi radialis Normal None None None _______ Normal Normal Normal Normal R. First dorsal interosseous Normal None None None _______ Normal Normal Normal Normal R. Tibialis anterior Normal None None None _______ Normal Normal Normal Normal     ASSESSMENT & PLAN Diana Blair 40 y.o. female with medical history significant for PCOS, morbid obesity, and diverticulitis who presents for evaluation of a polyclonal increase in immunoglobulins.   After review of the labs, review of the records, and discussion with the patient the patients findings are most consistent with a benign polyclonal increase in IgA.  This is not likely to be of clinical significance.  There is no evidence of a monoclonal gammopathy and typically monoclonal dermopathy is the cause neurological issues tend to be IgM and not IgA.  In order to be thorough we will order serum free light chains, UPEP, LDH, beta-2 microglobulin in order to assess for a monoclonal gammopathy.  If there are concerning findings on the above studies we will then further pursue metastatic survey and consider bone marrow biopsy.  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  Could also consider bone marrow biopsy if monoclonal component is elevated --Return to clinic pending the results of the above studies.  Orders Placed This Encounter  Procedures   CBC with Differential ([place] Only)    Standing Status:   Future    Number of Occurrences:   1    Standing Expiration Date:   12/31/2021   CMP ([facility] only)    Standing Status:   Future    Number of Occurrences:   1    Standing  Expiration Date:   12/31/2021   Lactate dehydrogenase (LDH)    Standing Status:   Future    Number of Occurrences:   1    Standing Expiration Date:   12/31/2021   Multiple Myeloma Panel (SPEP&IFE w/QIG)    Standing Status:   Future    Number of Occurrences:   1    Standing Expiration Date:  12/31/2021   Kappa/lambda light chains    Standing Status:   Future    Number of Occurrences:   1    Standing Expiration Date:   12/31/2021   24-Hr Ur UPEP/UIFE/Light Chains/TP    Standing Status:   Future    Standing Expiration Date:   12/31/2021   Beta 2 microglobulin    Standing Status:   Future    Number of Occurrences:   1    Standing Expiration Date:   12/31/2021    All questions were answered. The patient knows to call the clinic with any problems, questions or concerns.  A total of more than 60 minutes were spent on this encounter with face-to-face time and non-face-to-face time, including preparing to see the patient, ordering tests and/or medications, counseling the patient and coordination of care as outlined above.   Ledell Peoples, MD Department of Hematology/Oncology [place] at [facility] Phone: [phone] Pager: [phone] Email: [email or web address]  12/31/2020 4:20 PM

## 2020-12-31 ENCOUNTER — Other Ambulatory Visit: Payer: Self-pay

## 2020-12-31 ENCOUNTER — Inpatient Hospital Stay: Payer: Medicaid Other | Attending: Hematology and Oncology | Admitting: Hematology and Oncology

## 2020-12-31 ENCOUNTER — Inpatient Hospital Stay: Payer: Medicaid Other

## 2020-12-31 VITALS — BP 128/84 | HR 73 | Temp 98.2°F | Resp 18 | Wt >= 6400 oz

## 2020-12-31 DIAGNOSIS — R32 Unspecified urinary incontinence: Secondary | ICD-10-CM | POA: Diagnosis not present

## 2020-12-31 DIAGNOSIS — Z87891 Personal history of nicotine dependence: Secondary | ICD-10-CM | POA: Insufficient documentation

## 2020-12-31 DIAGNOSIS — Z801 Family history of malignant neoplasm of trachea, bronchus and lung: Secondary | ICD-10-CM | POA: Diagnosis not present

## 2020-12-31 DIAGNOSIS — R159 Full incontinence of feces: Secondary | ICD-10-CM | POA: Diagnosis not present

## 2020-12-31 DIAGNOSIS — R768 Other specified abnormal immunological findings in serum: Secondary | ICD-10-CM | POA: Diagnosis not present

## 2020-12-31 DIAGNOSIS — D890 Polyclonal hypergammaglobulinemia: Secondary | ICD-10-CM

## 2020-12-31 LAB — LACTATE DEHYDROGENASE: LDH: 256 U/L — ABNORMAL HIGH (ref 98–192)

## 2020-12-31 LAB — CMP (CANCER CENTER ONLY)
ALT: 36 U/L (ref 0–44)
AST: 33 U/L (ref 15–41)
Albumin: 3.7 g/dL (ref 3.5–5.0)
Alkaline Phosphatase: 128 U/L — ABNORMAL HIGH (ref 38–126)
Anion gap: 11 (ref 5–15)
BUN: 12 mg/dL (ref 6–20)
CO2: 24 mmol/L (ref 22–32)
Calcium: 9.8 mg/dL (ref 8.9–10.3)
Chloride: 105 mmol/L (ref 98–111)
Creatinine: 0.98 mg/dL (ref 0.44–1.00)
GFR, Estimated: >60 mL/min (ref >60)
Glucose, Bld: 115 mg/dL — ABNORMAL HIGH (ref 70–99)
Potassium: 4.2 mmol/L (ref 3.5–5.1)
Sodium: 140 mmol/L (ref 135–145)
Total Bilirubin: 0.3 mg/dL (ref 0.3–1.2)
Total Protein: 8 g/dL (ref 6.5–8.1)

## 2020-12-31 LAB — CBC WITH DIFFERENTIAL (CANCER CENTER ONLY)
Abs Immature Granulocytes: 0.02 10*3/uL (ref 0.00–0.07)
Basophils Absolute: 0 10*3/uL (ref 0.0–0.1)
Basophils Relative: 1 %
Eosinophils Absolute: 0.2 10*3/uL (ref 0.0–0.5)
Eosinophils Relative: 3 %
HCT: 38.1 % (ref 36.0–46.0)
Hemoglobin: 12.2 g/dL (ref 12.0–15.0)
Immature Granulocytes: 0 %
Lymphocytes Relative: 17 %
Lymphs Abs: 1 10*3/uL (ref 0.7–4.0)
MCH: 26.5 pg (ref 26.0–34.0)
MCHC: 32 g/dL (ref 30.0–36.0)
MCV: 82.6 fL (ref 80.0–100.0)
Monocytes Absolute: 0.6 10*3/uL (ref 0.1–1.0)
Monocytes Relative: 11 %
Neutro Abs: 3.9 10*3/uL (ref 1.7–7.7)
Neutrophils Relative %: 68 %
Platelet Count: 286 10*3/uL (ref 150–400)
RBC: 4.61 MIL/uL (ref 3.87–5.11)
RDW: 15.9 % — ABNORMAL HIGH (ref 11.5–15.5)
WBC Count: 5.7 10*3/uL (ref 4.0–10.5)
nRBC: 0 % (ref 0.0–0.2)

## 2021-01-01 LAB — KAPPA/LAMBDA LIGHT CHAINS
Kappa free light chain: 40.8 mg/L — ABNORMAL HIGH (ref 3.3–19.4)
Kappa, lambda light chain ratio: 1.64 (ref 0.26–1.65)
Lambda free light chains: 24.9 mg/L (ref 5.7–26.3)

## 2021-01-01 LAB — BETA 2 MICROGLOBULIN, SERUM: Beta-2 Microglobulin: 2 mg/L (ref 0.6–2.4)

## 2021-01-05 LAB — MULTIPLE MYELOMA PANEL, SERUM
Albumin SerPl Elph-Mcnc: 3.4 g/dL (ref 2.9–4.4)
Albumin/Glob SerPl: 1 (ref 0.7–1.7)
Alpha 1: 0.3 g/dL (ref 0.0–0.4)
Alpha2 Glob SerPl Elph-Mcnc: 0.6 g/dL (ref 0.4–1.0)
B-Globulin SerPl Elph-Mcnc: 1.4 g/dL — ABNORMAL HIGH (ref 0.7–1.3)
Gamma Glob SerPl Elph-Mcnc: 1.5 g/dL (ref 0.4–1.8)
Globulin, Total: 3.7 g/dL (ref 2.2–3.9)
IgA: 517 mg/dL — ABNORMAL HIGH (ref 87–352)
IgG (Immunoglobin G), Serum: 1417 mg/dL (ref 586–1602)
IgM (Immunoglobulin M), Srm: 135 mg/dL (ref 26–217)
Total Protein ELP: 7.1 g/dL (ref 6.0–8.5)

## 2021-01-13 DIAGNOSIS — Z0271 Encounter for disability determination: Secondary | ICD-10-CM

## 2021-01-15 ENCOUNTER — Other Ambulatory Visit: Payer: Self-pay | Admitting: *Deleted

## 2021-01-15 DIAGNOSIS — R768 Other specified abnormal immunological findings in serum: Secondary | ICD-10-CM

## 2021-01-19 ENCOUNTER — Telehealth: Payer: Self-pay | Admitting: *Deleted

## 2021-01-19 LAB — UPEP/UIFE/LIGHT CHAINS/TP, 24-HR UR
% BETA, Urine: 0 %
ALPHA 1 URINE: 0 %
Albumin, U: 100 %
Alpha 2, Urine: 0 %
Free Kappa Lt Chains,Ur: 35.61 mg/L (ref 1.17–86.46)
Free Kappa/Lambda Ratio: 6.26 (ref 1.83–14.26)
Free Lambda Lt Chains,Ur: 5.69 mg/L (ref 0.27–15.21)
GAMMA GLOBULIN URINE: 0 %
Total Protein, Urine-Ur/day: 86 mg/24 hr (ref 30–150)
Total Protein, Urine: 8.2 mg/dL
Total Volume: 1050

## 2021-01-19 NOTE — Telephone Encounter (Signed)
TCT patient regarding recent lab results. Spoke with pt and advised that her labs did not reveal any concerning abnormalities. Dr. Lorenso Courier advises that her protein abnormality is likely related to her inflammatory conditions. She had turned in her 24 hr urine on 9/22 and those results are not available at this time.  Advised that we will call her if there are any concerning results.  Advised that she does not need to f/u in this clinic  Pt voiced understanding.

## 2021-01-19 NOTE — Telephone Encounter (Signed)
-----   Message from Orson Slick, MD sent at 01/16/2021  1:16 PM EDT ----- Please let Diana Blair know that the workup of her protein levels did not show any concerning abnormalities. Most likely reason for protein abnormality is her inflammatory conditions. There is no need for routine f/u in our clinic but we are happy to see her back if any new issues were to arise.  ----- Message ----- From: [place]: 12/31/2020  11:17 AM EDT To: Orson Slick, MD

## 2021-01-20 NOTE — Progress Notes (Signed)
Office Visit Note  Patient: Diana Blair             Date of Birth: 1980/11/11           MRN: [id number]             PCP: Berkley Harvey, NP Referring: Berkley Harvey, NP Visit Date: 01/21/2021  Subjective:  No chief complaint on file.   History of Present Illness: Diana Blair is a 40 y.o. female here for muscle pain and weakness and elevated CRP. She does not recall specific onset timing but after her pregnancy in 2018 developed or significantly worsened in symptoms. She felt generalized weakness, fatigue, exertion intolerance this went on for at least a full year without any plan. She then developed increased problems with coughing and dyspnea thought to be possible COVID and then thought to be primarily upper airway cough syndrome but eventually abnormal chest imaging with high ACE level suspected to represent sarcoidosis. She did not undergo biopsy for this although symptoms remained stable no specific indication for intervention or immunosuppression for lungs. She also experienced increase in joint pain at multiple areas most significant in bilateral hands and ankles. While taking prednisone she felt an improvement but back to baseline after stopping. She did not see particularly much swelling or erythema. There was an area of skin indentation or dimpling on the right leg for a period of time that resolved spontaneously.   Labs reviewed 12/2020 CBC wnl CMP alk phos 128 LDH 256 SPEP IgA 517 polyclonal increase  09/2020 ANA neg RF neg ESR 45 CRP 28.8 CK 423 CMP Alk phos 139 ALT 40 IgA 566 Vit D 25 RPR neg  Imaging reviewed 01/09/20 HRCT Chest IMPRESSION: 1. Parenchymal changes in the lungs which appears similar to the prior study. These findings are nonspecific, but could certainly represent a pulmonary manifestation of sarcoidosis. 2. No definite mediastinal or hilar lymphadenopathy noted on today's noncontrast CT examination. 3. Splenomegaly.  Activities of Daily  Living:  Patient reports morning stiffness for all day. Patient Reports nocturnal pain.  Difficulty dressing/grooming: Reports Difficulty climbing stairs: Reports Difficulty getting out of chair: Reports Difficulty using hands for taps, buttons, cutlery, and/or writing: Reports  Review of Systems  Constitutional:  Positive for fatigue.  HENT:  Negative for mouth sores, mouth dryness and nose dryness.   Eyes:  Positive for itching. Negative for pain and dryness.  Respiratory:  Positive for shortness of breath. Negative for difficulty breathing.   Cardiovascular:  Positive for swelling in legs/feet. Negative for chest pain and palpitations.  Gastrointestinal:  Negative for blood in stool, constipation and diarrhea.  Endocrine: Negative for increased urination.  Genitourinary:  Negative for difficulty urinating.  Musculoskeletal:  Positive for joint pain, joint pain, joint swelling, myalgias, muscle weakness, morning stiffness, muscle tenderness and myalgias.  Skin:  Positive for rash. Negative for color change.  Allergic/Immunologic: Negative for susceptible to infections.  Neurological:  Positive for dizziness, numbness, memory loss and weakness. Negative for headaches.  Hematological:  Negative for bruising/bleeding tendency.  Psychiatric/Behavioral:  Negative for confusion.    PMFS History:  Patient Active Problem List   Diagnosis Date Noted   Generalized weakness 01/21/2021   Bilateral ankle pain 01/21/2021   Vitamin D deficiency 01/21/2021   Elevated CK 01/21/2021   Sleep apnea 12/09/2020   Allergic rhinitis 08/20/2020   Sarcoidosis 09/07/2019   Dysphagia 08/05/2019   Gastroesophageal reflux disease 03/20/2019   Pulmonary infiltrates on CXR 02/26/2019  Upper airway cough syndrome 02/26/2019   Advanced maternal age in multigravida 11/10/2016   Chronic hypertension in pregnancy 11/10/2016   IUGR (intrauterine growth restriction) affecting care of mother, third trimester,  other fetus 11/05/2016   BMI 60.0-69.9, adult ([place]) 05/04/2016   History of preterm delivery 08/20/2015   History of cervical cerclage 08/20/2015   PCOS (polycystic ovarian syndrome) 02/08/2013    Past Medical History:  Diagnosis Date   Chronic hypertension in obstetric context in first trimester 05/04/2016   Diverticulitis    Heart palpitations    Morbid obesity with BMI of 50.0-59.9, adult (HCC)    BMI 58.8   PCOS (polycystic ovarian syndrome)    Seizure ([facility])    as a child    Family History  Problem Relation Age of Onset   Cancer Mother        lung   Hypertension Mother    Diabetes Father    Heart failure Father    Hypertension Sister    Eczema Sister    Hypertension Brother    Diabetes Paternal Grandmother    Other Neg Hx    Colon cancer Neg Hx    Pancreatic cancer Neg Hx    Esophageal cancer Neg Hx    Past Surgical History:  Procedure Laterality Date   CERVICAL CERCLAGE  03/13/2012   Procedure: CERCLAGE CERVICAL;  Surgeon: Jonnie Kind, MD;  Location: [facility];  Service: Gynecology;  Laterality: N/A;   CERVICAL CERCLAGE  03/17/2012   Procedure: CERCLAGE CERVICAL;  Surgeon: Osborne Oman, MD;  Location: [facility];  Service: Gynecology;  Laterality: N/A;  Removal   COLONOSCOPY WITH PROPOFOL N/A 10/02/2020   Procedure: COLONOSCOPY WITH PROPOFOL;  Surgeon: Milus Banister, MD;  Location: [facility];  Service: Endoscopy;  Laterality: N/A;   ESOPHAGOGASTRODUODENOSCOPY (EGD) WITH PROPOFOL N/A 10/02/2020   Procedure: ESOPHAGOGASTRODUODENOSCOPY (EGD) WITH PROPOFOL;  Surgeon: Milus Banister, MD;  Location: [facility];  Service: Endoscopy;  Laterality: N/A;   POLYPECTOMY  10/02/2020   Procedure: POLYPECTOMY;  Surgeon: Milus Banister, MD;  Location: [facility];  Service: Endoscopy;;   Social History   Social History Narrative   Caffeine 2 soda in month. Sweet tea.  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  Single parent.     Immunization History  Administered Date(s) Administered   Influenza Split 05/24/2016   Tdap 05/24/2016     Objective: Vital Signs: BP 137/72 (BP Location: Left Wrist, Patient Position: Sitting, Cuff Size: Normal)   Pulse 79   Ht 5' 3"  (1.6 m)   Wt (!) 405 lb (183.7 kg)   BMI 71.74 kg/m    Physical Exam HENT:     Right Ear: External ear normal.     Left Ear: External ear normal.     Mouth/Throat:     Mouth: Mucous membranes are moist.     Pharynx: Oropharynx is clear.  Eyes:     Conjunctiva/sclera: Conjunctivae normal.  Cardiovascular:     Rate and Rhythm: Normal rate and regular rhythm.  Pulmonary:     Effort: Pulmonary effort is normal.     Breath sounds: Normal breath sounds.  Skin:    General: Skin is warm and dry.     Findings: No rash.  Neurological:     Mental Status: She is alert.  Psychiatric:        Mood and Affect: Mood normal.     Musculoskeletal Exam:  Shoulders full ROM no tenderness or swelling  Elbows full ROM no tenderness or swelling Wrists full ROM no tenderness or swelling Fingers full ROM no tenderness or swelling Knees full ROM no tenderness or swelling Ankles full ROM, some tenderness to pressure over lateral midfoot  Investigation: No additional findings.  Imaging: No results found.  Recent Labs: Lab Results  Component Value Date   WBC 6.1 02/09/2021   HGB 11.8 (L) 02/09/2021   PLT 282 02/09/2021   NA 139 02/09/2021   K 3.9 02/09/2021   CL 103 02/09/2021   CO2 28 02/09/2021   GLUCOSE 108 (H) 02/09/2021   BUN 11 02/09/2021   CREATININE 0.66 02/09/2021   BILITOT 0.2 (L) 02/09/2021   ALKPHOS 100 02/09/2021   AST 25 02/09/2021   ALT 32 02/09/2021   PROT 7.4 02/09/2021   ALBUMIN 3.8 02/09/2021   CALCIUM 9.4 02/09/2021   GFRAA >60 02/21/2019   QFTBGOLDPLUS NEGATIVE 02/26/2019    Speciality Comments: No specialty comments available.  Procedures:  No procedures performed Allergies: Patient has no known allergies.    Assessment / Plan:     Visit Diagnoses: Sarcoidosis - Plan: Angiotensin converting enzyme, Vitamin D 1,25 dihydroxy, Sedimentation rate, CK, Myositis Assessr Plus Jo-1 Autoabs  Presumed disease with clinical history, imaging, and ACE serology. I cannot confirm disease activity of extrapulmonary sarcoidosis on exam at this time is somewhat unclear. Checking serum disease activity markers and also for evidence of alternate process such as myositis with the weakness complaint as well. Previous prednisone treatments have had some short term improvement but not clear benefit outweighs risk for repeated dosing unless symptoms or lab results change.  Generalized weakness Elevated CK - Plan: Myositis Assessr Plus Jo-1 Autoabs  Overall weakness could represent some deconditioning but mildly elevated CK as well and with pulmonary disease. Checking myositis antibody panel assessment.  Chronic pain of both ankles  Ankle pain without obvious joint swelling or reduced ROM but pain laterally could be more overuse or strain injury related problem. Lab workup as above.  Vitamin D deficiency - Plan: Vitamin D 1,25 dihydroxy  Unclear amount of granulomatous disease activity or not, no reactive adenopathy on most recent imaging, will check vitamin D 1,25 for evidence of peripheral activity.  Orders: Orders Placed This Encounter  Procedures   Angiotensin converting enzyme   Vitamin D 1,25 dihydroxy   Sedimentation rate   CK   Myositis Assessr Plus Jo-1 Autoabs   No orders of the defined types were placed in this encounter.   Follow-Up Instructions: Return in about 2 weeks (around 02/04/2021) for New pt sarcoid f/u 2wks.   Collier Salina, MD  Note - This record has been created using [employer].  Chart creation errors have been sought, but may not always  have been located. Such creation errors do not reflect on  the standard of medical care.

## 2021-01-21 ENCOUNTER — Other Ambulatory Visit: Payer: Self-pay

## 2021-01-21 ENCOUNTER — Encounter: Payer: Self-pay | Admitting: Internal Medicine

## 2021-01-21 ENCOUNTER — Ambulatory Visit: Payer: Medicaid Other | Admitting: Internal Medicine

## 2021-01-21 VITALS — BP 137/72 | HR 79 | Ht 63.0 in | Wt >= 6400 oz

## 2021-01-21 DIAGNOSIS — R531 Weakness: Secondary | ICD-10-CM | POA: Diagnosis not present

## 2021-01-21 DIAGNOSIS — R918 Other nonspecific abnormal finding of lung field: Secondary | ICD-10-CM

## 2021-01-21 DIAGNOSIS — M25571 Pain in right ankle and joints of right foot: Secondary | ICD-10-CM | POA: Diagnosis not present

## 2021-01-21 DIAGNOSIS — D869 Sarcoidosis, unspecified: Secondary | ICD-10-CM | POA: Diagnosis not present

## 2021-01-21 DIAGNOSIS — E559 Vitamin D deficiency, unspecified: Secondary | ICD-10-CM

## 2021-01-21 DIAGNOSIS — G8929 Other chronic pain: Secondary | ICD-10-CM

## 2021-01-21 DIAGNOSIS — M25572 Pain in left ankle and joints of left foot: Secondary | ICD-10-CM

## 2021-01-21 DIAGNOSIS — R748 Abnormal levels of other serum enzymes: Secondary | ICD-10-CM | POA: Insufficient documentation

## 2021-01-22 ENCOUNTER — Ambulatory Visit: Payer: Medicaid Other | Admitting: Neurology

## 2021-01-22 LAB — SEDIMENTATION RATE: Sed Rate: 45 mm/h — ABNORMAL HIGH (ref 0–20)

## 2021-01-22 LAB — CK: Total CK: 326 U/L — ABNORMAL HIGH (ref 29–143)

## 2021-01-28 LAB — MYOSITIS ASSESSR PLUS JO-1 AUTOABS
EJ Autoabs: NOT DETECTED
Jo-1 Autoabs: <1 AI
Ku Autoabs: NOT DETECTED
Mi-2 Autoabs: NOT DETECTED
OJ Autoabs: NOT DETECTED
PL-12 Autoabs: NOT DETECTED
PL-7 Autoabs: NOT DETECTED
SRP Autoabs: NOT DETECTED

## 2021-01-28 LAB — VITAMIN D 1,25 DIHYDROXY
Vitamin D 1, 25 (OH)2 Total: 40 pg/mL (ref 18–72)
Vitamin D2 1, 25 (OH)2: <8 pg/mL
Vitamin D3 1, 25 (OH)2: 40 pg/mL

## 2021-01-28 LAB — ANGIOTENSIN CONVERTING ENZYME: Angiotensin-Converting Enzyme: 65 U/L (ref 9–67)

## 2021-02-09 ENCOUNTER — Telehealth (HOSPITAL_BASED_OUTPATIENT_CLINIC_OR_DEPARTMENT_OTHER): Payer: Self-pay | Admitting: Emergency Medicine

## 2021-02-09 ENCOUNTER — Emergency Department (HOSPITAL_BASED_OUTPATIENT_CLINIC_OR_DEPARTMENT_OTHER)
Admission: EM | Admit: 2021-02-09 | Discharge: 2021-02-09 | Disposition: A | Discharge status: Home / Self Care | Payer: Medicaid Other | Attending: Emergency Medicine | Admitting: Emergency Medicine

## 2021-02-09 ENCOUNTER — Other Ambulatory Visit: Payer: Self-pay

## 2021-02-09 ENCOUNTER — Encounter (HOSPITAL_BASED_OUTPATIENT_CLINIC_OR_DEPARTMENT_OTHER): Payer: Self-pay

## 2021-02-09 DIAGNOSIS — N939 Abnormal uterine and vaginal bleeding, unspecified: Secondary | ICD-10-CM | POA: Insufficient documentation

## 2021-02-09 DIAGNOSIS — Z87891 Personal history of nicotine dependence: Secondary | ICD-10-CM | POA: Diagnosis not present

## 2021-02-09 LAB — COMPREHENSIVE METABOLIC PANEL
ALT: 32 U/L (ref 0–44)
AST: 25 U/L (ref 15–41)
Albumin: 3.8 g/dL (ref 3.5–5.0)
Alkaline Phosphatase: 100 U/L (ref 38–126)
Anion gap: 8 (ref 5–15)
BUN: 11 mg/dL (ref 6–20)
CO2: 28 mmol/L (ref 22–32)
Calcium: 9.4 mg/dL (ref 8.9–10.3)
Chloride: 103 mmol/L (ref 98–111)
Creatinine, Ser: 0.66 mg/dL (ref 0.44–1.00)
GFR, Estimated: >60 mL/min (ref >60)
Glucose, Bld: 108 mg/dL — ABNORMAL HIGH (ref 70–99)
Potassium: 3.9 mmol/L (ref 3.5–5.1)
Sodium: 139 mmol/L (ref 135–145)
Total Bilirubin: 0.2 mg/dL — ABNORMAL LOW (ref 0.3–1.2)
Total Protein: 7.4 g/dL (ref 6.5–8.1)

## 2021-02-09 LAB — CBC WITH DIFFERENTIAL/PLATELET
Abs Immature Granulocytes: 0.02 10*3/uL (ref 0.00–0.07)
Basophils Absolute: 0 10*3/uL (ref 0.0–0.1)
Basophils Relative: 0 %
Eosinophils Absolute: 0.1 10*3/uL (ref 0.0–0.5)
Eosinophils Relative: 2 %
HCT: 37 % (ref 36.0–46.0)
Hemoglobin: 11.8 g/dL — ABNORMAL LOW (ref 12.0–15.0)
Immature Granulocytes: 0 %
Lymphocytes Relative: 16 %
Lymphs Abs: 1 10*3/uL (ref 0.7–4.0)
MCH: 26 pg (ref 26.0–34.0)
MCHC: 31.9 g/dL (ref 30.0–36.0)
MCV: 81.7 fL (ref 80.0–100.0)
Monocytes Absolute: 0.7 10*3/uL (ref 0.1–1.0)
Monocytes Relative: 11 %
Neutro Abs: 4.4 10*3/uL (ref 1.7–7.7)
Neutrophils Relative %: 71 %
Platelets: 282 10*3/uL (ref 150–400)
RBC: 4.53 MIL/uL (ref 3.87–5.11)
RDW: 16.1 % — ABNORMAL HIGH (ref 11.5–15.5)
WBC: 6.1 10*3/uL (ref 4.0–10.5)
nRBC: 0 % (ref 0.0–0.2)

## 2021-02-09 NOTE — Discharge Instructions (Addendum)
Ms. Hodo, Your blood count was a little low following your episode of bleeding.  Please follow-up with your Primary care physician within a week for vaginal bleeding.  Your pelvic exam showed that you were still bleeding a small amount, but no signs concerning for a tear or other trauma.   Thank you for letting us be part of your care, Christiana Fuchs, DO Madalyn Rob, MD

## 2021-02-09 NOTE — ED Notes (Signed)
Patient verbalizes understanding of discharge instructions. Opportunity for questioning and answers were provided. Patient discharged from ED.  °

## 2021-02-09 NOTE — ED Notes (Addendum)
Pt unable to sit in bed, sitting in chair until assess by provider.

## 2021-02-09 NOTE — Telephone Encounter (Signed)
Pt with recent visit for vaginal bleeding.  Needs pregnancy test.  Left message on her voicemail to call back.  Will also leave message with charge nurse in the ED to attempt to contact pt in the am.

## 2021-02-09 NOTE — ED Notes (Signed)
At bedside for pelvic exam w/ MD and resident.

## 2021-02-09 NOTE — ED Provider Notes (Addendum)
[facility] Provider Note   CSN: [id number] Arrival date & time: 02/09/21  1013     History Chief Complaint  Patient presents with   Abdominal Pain   Vaginal Bleeding    Diana Blair is a 40 y.o. female.  Patient presents following episode on Saturday, October 15 of vaginal bleeding.  She started bleeding on Thursday and states that on Saturday evening she sneezed and felt a large amount of blood at that time.  She then went to the restroom and felt a large amount of blood continue.  She saw a large clots up to 3 cm x 3 cm in the toilet.  No abdominal pain at that time.  Since then she has had some episodes of spotting throughout Sunday into this morning.  She has a history of PCOS with irregular periods.  Her last period was about 4 months ago.  She does not follow regularly with her OBGYN.   Abdominal Pain Associated symptoms: vaginal bleeding   Vaginal Bleeding Associated symptoms: abdominal pain   Associated symptoms: no dizziness       Past Medical History:  Diagnosis Date   Chronic hypertension in obstetric context in first trimester 05/04/2016   Diverticulitis    Heart palpitations    Morbid obesity with BMI of 50.0-59.9, adult (HCC)    BMI 58.8   PCOS (polycystic ovarian syndrome)    Seizure ([place])    as a child    Patient Active Problem List   Diagnosis Date Noted   Generalized weakness 01/21/2021   Bilateral ankle pain 01/21/2021   Vitamin D deficiency 01/21/2021   Elevated CK 01/21/2021   Sleep apnea 12/09/2020   Allergic rhinitis 08/20/2020   Sarcoidosis 09/07/2019   Dysphagia 08/05/2019   Gastroesophageal reflux disease 03/20/2019   Pulmonary infiltrates on CXR 02/26/2019   Upper airway cough syndrome 02/26/2019   Advanced maternal age in multigravida 11/10/2016   Chronic hypertension in pregnancy 11/10/2016   IUGR (intrauterine growth restriction) affecting care of mother, third trimester, other fetus 11/05/2016   BMI  60.0-69.9, adult ([place]) 05/04/2016   History of preterm delivery 08/20/2015   History of cervical cerclage 08/20/2015   PCOS (polycystic ovarian syndrome) 02/08/2013    Past Surgical History:  Procedure Laterality Date   CERVICAL CERCLAGE  03/13/2012   Procedure: CERCLAGE CERVICAL;  Surgeon: Jonnie Kind, MD;  Location: [facility];  Service: Gynecology;  Laterality: N/A;   CERVICAL CERCLAGE  03/17/2012   Procedure: CERCLAGE CERVICAL;  Surgeon: Osborne Oman, MD;  Location: [facility];  Service: Gynecology;  Laterality: N/A;  Removal   COLONOSCOPY WITH PROPOFOL N/A 10/02/2020   Procedure: COLONOSCOPY WITH PROPOFOL;  Surgeon: Milus Banister, MD;  Location: [facility];  Service: Endoscopy;  Laterality: N/A;   ESOPHAGOGASTRODUODENOSCOPY (EGD) WITH PROPOFOL N/A 10/02/2020   Procedure: ESOPHAGOGASTRODUODENOSCOPY (EGD) WITH PROPOFOL;  Surgeon: Milus Banister, MD;  Location: [facility];  Service: Endoscopy;  Laterality: N/A;   POLYPECTOMY  10/02/2020   Procedure: POLYPECTOMY;  Surgeon: Milus Banister, MD;  [facility];  Service: Endoscopy;;     OB History     Gravida  2   Para  2   Term  1   Preterm  1   AB  0   Living  1      SAB  0   IAB  0   Ectopic  0   Multiple  0   Live Births  2  Family History  Problem Relation Age of Onset   Cancer Mother        lung   Hypertension Mother    Diabetes Father    Heart failure Father    Hypertension Sister    Eczema Sister    Hypertension Brother    Diabetes Paternal Grandmother    Other Neg Hx    Colon cancer Neg Hx    Pancreatic cancer Neg Hx    Esophageal cancer Neg Hx     Social History   Tobacco Use   Smoking status: Former    Packs/day: 1.50    Years: 6.00    Pack years: 9.00    Types: Cigarettes    Quit date: 02/08/2009    Years since quitting: 12.0   Smokeless tobacco: Never  Vaping Use   Vaping Use: Never used  Substance Use Topics   Alcohol use: No   Drug use: No     Home Medications Prior to Admission medications   Medication Sig Start Date End Date Taking? Authorizing Provider  Biotin w/ Vitamins C & E (HAIR/SKIN/NAILS) 1250-7.5-7.5 MCG-MG-UNT CHEW Chew by mouth.    [provider]  celecoxib (CELEBREX) 100 MG capsule Take 100 mg by mouth 2 (two) times daily. Patient not taking: Reported on 01/21/2021 12/04/20   [provider]  cetirizine (ZYRTEC) 10 MG tablet Take 10 mg by mouth daily.    [provider]  Cholecalciferol (D3-1000) 25 MCG (1000 UT) capsule Take 1,000 Units by mouth daily.    [provider]  famotidine (PEPCID) 20 MG tablet Take 1 tablet (20 mg total) by mouth at bedtime. 08/05/20   Milus Banister, MD  fluticasone [facility]) 50 MCG/ACT nasal spray Place 2 sprays into both nostrils daily. Patient taking differently: Place 2 sprays into both nostrils as needed. 08/22/20   Collene Gobble, MD  hydrochlorothiazide (HYDRODIURIL) 12.5 MG tablet Take one po every 2-3 days as needed for edema 11/05/20   [provider]  methylcellulose (CITRUCEL) oral powder Take 1 packet by mouth daily. 08/05/20   Milus Banister, MD  pantoprazole (PROTONIX) 40 MG tablet TAKE 1 TABLET(40 MG) BY MOUTH DAILY 10/29/20   Tanda Rockers, MD  Pyridoxine HCl (VITAMIN B6) 100 MG TABS  12/18/20   [provider]  vitamin B-12 (CYANOCOBALAMIN) 1000 MCG tablet Take 1,000 mcg by mouth daily.    [provider]  albuterol (PROVENTIL HFA;VENTOLIN HFA) 108 (90 Base) MCG/ACT inhaler Inhale 1-2 puffs into the lungs every 4 (four) hours as needed for wheezing or shortness of breath (or cough). 04/01/18 03/20/19  Street, [place], PA-C    Allergies    Patient has no known allergies.  Review of Systems   Review of Systems  Eyes: Negative.   Respiratory: Negative.    Cardiovascular: Negative.   Gastrointestinal:  Positive for abdominal pain.  Genitourinary:  Positive for vaginal bleeding.  Neurological:  Positive  for weakness. Negative for dizziness.   Physical Exam Updated Vital Signs BP 121/77 (BP Location: Right Wrist)   Pulse 83   Temp 99.3 F (37.4 C) (Oral)   Resp 20   Ht 5\' 3"  (1.6 m)   Wt (!) 179.2 kg   SpO2 98%   BMI 69.97 kg/m   Physical Exam HENT:     Head: Normocephalic and atraumatic.  Cardiovascular:     Rate and Rhythm: Normal rate and regular rhythm.  Pulmonary:     Effort: Pulmonary effort is normal.  Breath sounds: Normal breath sounds.  Genitourinary:    Vagina: No signs of injury. Bleeding present.     Cervix: No discharge.     Uterus: Normal.      Comments: Normal appearing external gentilia, normal appearing vaginal mucosa and cervix, No signs of tears or lacerations .  no discharge. Significant for moderate amount of Blood in vaginal vault.   Skin:    General: Skin is warm and dry.  Neurological:     Mental Status: She is alert.    ED Results / Procedures / Treatments   Labs (all labs ordered are listed, but only abnormal results are displayed) Labs Reviewed - No data to display  EKG None  Radiology No results found.  Procedures Pelvic exam  Date/Time: 02/09/2021 1:40 PM Performed by: Damichael Hofman, Joellen Jersey, DO Authorized by: Lucrezia Starch, MD  Consent: Verbal consent obtained. Written consent not obtained. Consent given by: patient Patient identity confirmed: verbally with patient Preparation: Patient was prepped and draped in the usual sterile fashion. Local anesthesia used: no  Anesthesia: Local anesthesia used: no  Sedation: Patient sedated: no  Patient tolerance: patient tolerated the procedure well with no immediate complications     Medications Ordered in ED Medications - No data to display  ED Course  I have reviewed the triage vital signs and the nursing notes.  Pertinent labs & imaging results that were available during my care of the patient were reviewed by me and considered in my medical decision making (see chart  for details).    MDM Rules/Calculators/A&P                           Patient is a 40 year old who presented due to vaginal bleeding.  Pelvic exam performed and showed that she did have a moderate amount of blood in the vaginal vault.  No signs concerning for tears or laceration.  CBC stable with small decrease from 12.2 to 11.8 over one month.  Advised patient to follow-up with her primary care physician for PCOS and Menorrhagia.   Final Clinical Impression(s) / ED Diagnoses Final diagnoses:  None    Rx / DC Orders ED Discharge Orders     None        Noreen Mackintosh, Joellen Jersey, DO 02/09/21 [place], [place], DO 02/09/21 1341    Lucrezia Starch, MD 02/09/21 2126

## 2021-02-09 NOTE — ED Triage Notes (Signed)
No period since June, states she began bleeding ~last Thursday. States hx of irregular periods. Hx of PCOS. States passing large clot a couple days ago.

## 2021-02-14 NOTE — Progress Notes (Deleted)
Office Visit Note  Patient: Diana Blair             Date of Birth: 04/02/1981           MRN: [id number]             PCP: Berkley Harvey, NP Referring: Berkley Harvey, NP Visit Date: 02/16/2021   Subjective:  No chief complaint on file.   History of Present Illness: Diana Blair is a 40 y.o. female here for follow up ***   Previous HPI    No Rheumatology ROS completed.   PMFS History:  Patient Active Problem List   Diagnosis Date Noted   Generalized weakness 01/21/2021   Bilateral ankle pain 01/21/2021   Vitamin D deficiency 01/21/2021   Elevated CK 01/21/2021   Sleep apnea 12/09/2020   Allergic rhinitis 08/20/2020   Sarcoidosis 09/07/2019   Dysphagia 08/05/2019   Gastroesophageal reflux disease 03/20/2019   Pulmonary infiltrates on CXR 02/26/2019   Upper airway cough syndrome 02/26/2019   Advanced maternal age in multigravida 11/10/2016   Chronic hypertension in pregnancy 11/10/2016   IUGR (intrauterine growth restriction) affecting care of mother, third trimester, other fetus 11/05/2016   BMI 60.0-69.9, adult ([place]) 05/04/2016   History of preterm delivery 08/20/2015   History of cervical cerclage 08/20/2015   PCOS (polycystic ovarian syndrome) 02/08/2013    Past Medical History:  Diagnosis Date   Chronic hypertension in obstetric context in first trimester 05/04/2016   Diverticulitis    Heart palpitations    Morbid obesity with BMI of 50.0-59.9, adult (HCC)    BMI 58.8   PCOS (polycystic ovarian syndrome)    Seizure ([place])    as a child    Family History  Problem Relation Age of Onset   Cancer Mother        lung   Hypertension Mother    Diabetes Father    Heart failure Father    Hypertension Sister    Eczema Sister    Hypertension Brother    Diabetes Paternal Grandmother    Other Neg Hx    Colon cancer Neg Hx    Pancreatic cancer Neg Hx    Esophageal cancer Neg Hx    Past Surgical History:  Procedure Laterality Date   CERVICAL CERCLAGE   03/13/2012   Procedure: CERCLAGE CERVICAL;  Surgeon: Jonnie Kind, MD;  Location: [facility];  Service: Gynecology;  Laterality: N/A;   CERVICAL CERCLAGE  03/17/2012   Procedure: CERCLAGE CERVICAL;  Surgeon: Osborne Oman, MD;  Location: [facility];  Service: Gynecology;  Laterality: N/A;  Removal   COLONOSCOPY WITH PROPOFOL N/A 10/02/2020   Procedure: COLONOSCOPY WITH PROPOFOL;  Surgeon: Milus Banister, MD;  Location[facility];  Service: Endoscopy;  Laterality: N/A;   ESOPHAGOGASTRODUODENOSCOPY (EGD) WITH PROPOFOL N/A 10/02/2020   Procedure: ESOPHAGOGASTRODUODENOSCOPY (EGD) WITH PROPOFOL;  Surgeon: Milus Banister, MD;  Location: [facility];  Service: Endoscopy;  Laterality: N/A;   POLYPECTOMY  10/02/2020   Procedure: POLYPECTOMY;  Surgeon: Milus Banister, MD;  Location: [facility];  Service: Endoscopy;;   Social History   Social History Narrative   Caffeine 2 soda in month. Sweet tea.  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  Single parent.    Immunization History  Administered Date(s) Administered   Influenza Split 05/24/2016   Tdap 05/24/2016     Objective: Vital Signs: There were no vitals taken for this visit.   Physical Exam  Musculoskeletal Exam: ***  CDAI Exam: CDAI Score: -- Patient Global: --; Provider Global: -- Swollen: --; Tender: -- Joint Exam 02/16/2021   No joint exam has been documented for this visit   There is currently no information documented on the homunculus. Go to the Rheumatology activity and complete the homunculus joint exam.  Investigation: No additional findings.  Imaging: No results found.  Recent Labs: Lab Results  Component Value Date   WBC 6.1 02/09/2021   HGB 11.8 (L) 02/09/2021   PLT 282 02/09/2021   NA 139 02/09/2021   K 3.9 02/09/2021   CL 103 02/09/2021   CO2 28 02/09/2021   GLUCOSE 108 (H) 02/09/2021   BUN 11 02/09/2021   CREATININE 0.66 02/09/2021   BILITOT 0.2 (L)  02/09/2021   ALKPHOS 100 02/09/2021   AST 25 02/09/2021   ALT 32 02/09/2021   PROT 7.4 02/09/2021   ALBUMIN 3.8 02/09/2021   CALCIUM 9.4 02/09/2021   GFRAA >60 02/21/2019   QFTBGOLDPLUS NEGATIVE 02/26/2019    Speciality Comments: No specialty comments available.  Procedures:  No procedures performed Allergies: Patient has no known allergies.   Assessment / Plan:     Visit Diagnoses: No diagnosis found.  ***  Orders: No orders of the defined types were placed in this encounter.  No orders of the defined types were placed in this encounter.    Follow-Up Instructions: No follow-ups on file.   Collier Salina, MD  Note - This record has been created using [employer].  Chart creation errors have been sought, but may not always  have been located. Such creation errors do not reflect on  the standard of medical care.

## 2021-02-16 ENCOUNTER — Ambulatory Visit: Payer: Medicaid Other | Admitting: Internal Medicine

## 2021-03-02 ENCOUNTER — Ambulatory Visit: Payer: Medicaid Other | Admitting: Internal Medicine

## 2021-03-02 NOTE — Progress Notes (Deleted)
Office Visit Note  Patient: Diana Blair             Date of Birth: April 20, 1981           MRN: [id number]             PCP: Berkley Harvey, NP Referring: Berkley Harvey, NP Visit Date: 03/02/2021   Subjective:  No chief complaint on file.   History of Present Illness: ALOURA MATSUOKA is a 40 y.o. female here for follow up for joint pain, dyspnea, and history sarcoidosis. ***   Previous HPI 01/21/21 VERCIE POKORNY is a 40 y.o. female here for muscle pain and weakness and elevated CRP. She does not recall specific onset timing but after her pregnancy in 2018 developed or significantly worsened in symptoms. She felt generalized weakness, fatigue, exertion intolerance this went on for at least a full year without any plan. She then developed increased problems with coughing and dyspnea thought to be possible COVID and then thought to be primarily upper airway cough syndrome but eventually abnormal chest imaging with high ACE level suspected to represent sarcoidosis. She did not undergo biopsy for this although symptoms remained stable no specific indication for intervention or immunosuppression for lungs. She also experienced increase in joint pain at multiple areas most significant in bilateral hands and ankles. While taking prednisone she felt an improvement but back to baseline after stopping. She did not see particularly much swelling or erythema. There was an area of skin indentation or dimpling on the right leg for a period of time that resolved spontaneously.     Labs reviewed 12/2020 CBC wnl CMP alk phos 128 LDH 256 SPEP IgA 517 polyclonal increase   09/2020 ANA neg RF neg ESR 45 CRP 28.8 CK 423 CMP Alk phos 139 ALT 40 IgA 566 Vit D 25 RPR neg   Imaging reviewed 01/09/20 HRCT Chest IMPRESSION: 1. Parenchymal changes in the lungs which appears similar to the prior study. These findings are nonspecific, but could certainly represent a pulmonary manifestation of  sarcoidosis. 2. No definite mediastinal or hilar lymphadenopathy noted on today's noncontrast CT examination. 3. Splenomegaly.   No Rheumatology ROS completed.   PMFS History:  Patient Active Problem List   Diagnosis Date Noted   Generalized weakness 01/21/2021   Bilateral ankle pain 01/21/2021   Vitamin D deficiency 01/21/2021   Elevated CK 01/21/2021   Sleep apnea 12/09/2020   Allergic rhinitis 08/20/2020   Sarcoidosis 09/07/2019   Dysphagia 08/05/2019   Gastroesophageal reflux disease 03/20/2019   Pulmonary infiltrates on CXR 02/26/2019   Upper airway cough syndrome 02/26/2019   Advanced maternal age in multigravida 11/10/2016   Chronic hypertension in pregnancy 11/10/2016   IUGR (intrauterine growth restriction) affecting care of mother, third trimester, other fetus 11/05/2016   BMI 60.0-69.9, adult ([place]) 05/04/2016   History of preterm delivery 08/20/2015   History of cervical cerclage 08/20/2015   PCOS (polycystic ovarian syndrome) 02/08/2013    Past Medical History:  Diagnosis Date   Chronic hypertension in obstetric context in first trimester 05/04/2016   Diverticulitis    Heart palpitations    Morbid obesity with BMI of 50.0-59.9, adult ([facility])    BMI 58.8   PCOS (polycystic ovarian syndrome)    Seizure ([place])    as a child    Family History  Problem Relation Age of Onset   Cancer Mother        lung   Hypertension Mother    Diabetes  Father    Heart failure Father    Hypertension Sister    Eczema Sister    Hypertension Brother    Diabetes Paternal Grandmother    Other Neg Hx    Colon cancer Neg Hx    Pancreatic cancer Neg Hx    Esophageal cancer Neg Hx    Past Surgical History:  Procedure Laterality Date   CERVICAL CERCLAGE  03/13/2012   Procedure: CERCLAGE CERVICAL;  Surgeon: Jonnie Kind, MD;  Location: [facility];  Service: Gynecology;  Laterality: N/A;   CERVICAL CERCLAGE  03/17/2012   Procedure: CERCLAGE CERVICAL;  Surgeon: Osborne Oman, MD;   Location: [facility];  Service: Gynecology;  Laterality: N/A;  Removal   COLONOSCOPY WITH PROPOFOL N/A 10/02/2020   Procedure: COLONOSCOPY WITH PROPOFOL;  Surgeon: Milus Banister, MD;  Location: [facility];  Service: Endoscopy;  Laterality: N/A;   ESOPHAGOGASTRODUODENOSCOPY (EGD) WITH PROPOFOL N/A 10/02/2020   Procedure: ESOPHAGOGASTRODUODENOSCOPY (EGD) WITH PROPOFOL;  Surgeon: Milus Banister, MD;  Location: [facility];  Service: Endoscopy;  Laterality: N/A;   POLYPECTOMY  10/02/2020   Procedure: POLYPECTOMY;  Surgeon: Milus Banister, MD;  Location: [facility];  Service: Endoscopy;;   Social History   Social History Narrative   Caffeine 2 soda in month. Sweet tea.  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  Single parent.    Immunization History  Administered Date(s) Administered   Influenza Split 05/24/2016   Tdap 05/24/2016     Objective: Vital Signs: There were no vitals taken for this visit.   Physical Exam   Musculoskeletal Exam: ***  CDAI Exam: CDAI Score: -- Patient Global: --; Provider Global: -- Swollen: --; Tender: -- Joint Exam 03/02/2021   No joint exam has been documented for this visit   There is currently no information documented on the homunculus. Go to the Rheumatology activity and complete the homunculus joint exam.  Investigation: No additional findings.  Imaging: No results found.  Recent Labs: Lab Results  Component Value Date   WBC 6.1 02/09/2021   HGB 11.8 (L) 02/09/2021   PLT 282 02/09/2021   NA 139 02/09/2021   K 3.9 02/09/2021   CL 103 02/09/2021   CO2 28 02/09/2021   GLUCOSE 108 (H) 02/09/2021   BUN 11 02/09/2021   CREATININE 0.66 02/09/2021   BILITOT 0.2 (L) 02/09/2021   ALKPHOS 100 02/09/2021   AST 25 02/09/2021   ALT 32 02/09/2021   PROT 7.4 02/09/2021   ALBUMIN 3.8 02/09/2021   CALCIUM 9.4 02/09/2021   GFRAA >60 02/21/2019   QFTBGOLDPLUS NEGATIVE 02/26/2019    Speciality  Comments: No specialty comments available.  Procedures:  No procedures performed Allergies: Patient has no known allergies.   Assessment / Plan:     Visit Diagnoses: No diagnosis found.  ***  Orders: No orders of the defined types were placed in this encounter.  No orders of the defined types were placed in this encounter.    Follow-Up Instructions: No follow-ups on file.   Collier Salina, MD  Note - This record has been created using [employer].  Chart creation errors have been sought, but may not always  have been located. Such creation errors do not reflect on  the standard of medical care.

## 2021-03-15 NOTE — Progress Notes (Signed)
ID:  Diana Blair, DOB 1980/10/13, MRN [id number]  PCP:  Berkley Harvey, NP  Cardiologist:  Rex Kras, DO, [facility] (established care 12/05/2019) Former Cardiology Providers: Dr. Ellyn Hack  Date: 03/16/21 Last Office Visit: 04/08/2020  Chief Complaint  Patient presents with   Leg Swelling   Follow-up    1 YEAR    HPI  Diana Blair is a 40 y.o. 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  Patient is referred to the office at the request of her primary care provider for evaluation of shortness of breath, palpitations, morbid obesity.  Since establishing care she has undergone an echocardiogram which notes preserved LVEF and normal diastolic function without any significant valvular heart disease.  Patient also had a 14-day extended Holter monitor results reviewed below for further reference.  In the past we have tried to have a prior authorization done for stress test 2-day protocol as patient is not able to exercise on a treadmill but this has been denied by her insurance company.  Since last visit she has followed up with pulmonary medicine according to her she is given the working diagnosis of pulmonary sarcoidosis.  Patient states that she does not have lung nodules large enough to biopsy as of yet.  She also states that she has a mild degree of sleep apnea and was recommended to have a dental appliance but this has not been followed through.  She presents today for a yearly office visit and lower extremity swelling.  Patient does not have any significant swelling on today's physical examination.  But she did bring in a picture where she is noted to have pitting edema.  Patient states that the swelling self-limited after a few days and has not reoccurred  in that severity.  FUNCTIONAL STATUS: No structured exercise program or daily routine.   ALLERGIES: No Known Allergies  MEDICATION LIST PRIOR TO VISIT: Current Meds  Medication Sig   Biotin w/ Vitamins C & E (HAIR/SKIN/NAILS) 1250-7.5-7.5 MCG-MG-UNT CHEW Chew by mouth.   cetirizine (ZYRTEC) 10 MG tablet Take 10 mg by mouth daily.   Cholecalciferol (D3-1000) 25 MCG (1000 UT) capsule Take 1,000 Units by mouth daily.   famotidine (PEPCID) 20 MG tablet Take 1 tablet (20 mg total) by mouth at bedtime. (Patient taking differently: Take 40 mg by mouth at bedtime.)   Ferrous Sulfate Dried (HIGH POTENCY IRON) 65 MG TABS Take by mouth.   fluticasone (FLONASE) 50 MCG/ACT nasal spray Place 2 sprays into both nostrils daily. (Patient taking differently: Place 2 sprays into both nostrils as needed.)   hydrochlorothiazide (HYDRODIURIL) 12.5 MG tablet Take one po every 2-3 days as needed for edema   medroxyPROGESTERone (PROVERA) 10 MG tablet Take 10 mg by mouth daily.   methylcellulose (CITRUCEL) oral powder Take 1 packet by mouth daily.   pantoprazole (PROTONIX) 40 MG tablet TAKE 1 TABLET(40 MG) BY MOUTH DAILY   Pyridoxine HCl (VITAMIN B6) 100 MG TABS    vitamin B-12 (CYANOCOBALAMIN) 1000 MCG tablet Take 1,000 mcg by mouth daily.     PAST MEDICAL HISTORY: Past Medical History:  Diagnosis Date   Chronic hypertension in obstetric context in first trimester 05/04/2016   Diverticulitis    Heart palpitations    Morbid obesity with BMI of 50.0-59.9, adult ([place])  BMI 58.8   PCOS (polycystic ovarian syndrome)    Seizure ([place])    as a child    PAST SURGICAL HISTORY: Past Surgical History:  Procedure Laterality Date   CERVICAL CERCLAGE  03/13/2012   Procedure: CERCLAGE CERVICAL;  Surgeon: Jonnie Kind, MD;  Location: [facility];  Service: Gynecology;  Laterality: N/A;   CERVICAL CERCLAGE  03/17/2012   Procedure: CERCLAGE CERVICAL;  Surgeon: Osborne Oman, MD;  Location: [facility];  Service:  Gynecology;  Laterality: N/A;  Removal   COLONOSCOPY WITH PROPOFOL N/A 10/02/2020   Procedure: COLONOSCOPY WITH PROPOFOL;  Surgeon: Milus Banister, MD;  Location: [facility];  Service: Endoscopy;  Laterality: N/A;   ESOPHAGOGASTRODUODENOSCOPY (EGD) WITH PROPOFOL N/A 10/02/2020   Procedure: ESOPHAGOGASTRODUODENOSCOPY (EGD) WITH PROPOFOL;  Surgeon: Milus Banister, MD;  [facility];  Service: Endoscopy;  Laterality: N/A;   POLYPECTOMY  10/02/2020   Procedure: POLYPECTOMY;  Surgeon: Milus Banister, MD;  [facility];  Service: Endoscopy;;    FAMILY HISTORY: The patient family history includes Cancer in her mother; Diabetes in her father and paternal grandmother; Eczema in her sister; Heart failure in her father; Hypertension in her brother, mother, and sister.  SOCIAL HISTORY:  The patient  reports that she quit smoking about 12 years ago. Her smoking use included cigarettes. She has a 9.00 pack-year smoking history. She has never used smokeless tobacco. She reports that she does not drink alcohol and does not use drugs.  REVIEW OF SYSTEMS: Review of Systems  Constitutional: Negative for chills and fever.  HENT:  Negative for hoarse voice and nosebleeds.   Eyes:  Negative for discharge, double vision and pain.  Cardiovascular:  Negative for chest pain, claudication, dyspnea on exertion, leg swelling, near-syncope, orthopnea, palpitations, paroxysmal nocturnal dyspnea and syncope.  Respiratory:  Positive for shortness of breath (chronic). Negative for hemoptysis.   Musculoskeletal:  Negative for muscle cramps and myalgias.  Gastrointestinal:  Negative for abdominal pain, constipation, diarrhea, heartburn, hematemesis, hematochezia, melena, nausea and vomiting.  Genitourinary:  Negative for bladder incontinence.  Neurological:  Negative for dizziness and light-headedness.   PHYSICAL EXAM: Vitals with BMI 03/16/2021 03/16/2021 02/09/2021  Height - 5\' 3"  -  Weight - 407  lbs -  BMI - 72.53 -  Systolic 664 403 474  Diastolic 77 78 75  Pulse 99 95 76    CONSTITUTIONAL: Well-developed and well-nourished. No acute distress.  SKIN: Skin is warm and dry. No rash noted. No cyanosis. No pallor. No jaundice HEAD: Normocephalic and atraumatic.  EYES: No scleral icterus MOUTH/THROAT: Moist oral membranes.  NECK: No JVD present. No thyromegaly noted. No carotid bruits  LYMPHATIC: No visible cervical adenopathy.  CHEST Normal respiratory effort. No intercostal retractions  LUNGS: Clear to auscultation in the upper lung fields.  Decreased breath sounds at the bases.  No stridor. No wheezes. No rales.  CARDIOVASCULAR: Regular, positive S1-S2, no obvious murmurs rubs or gallops appreciated. ABDOMINAL: Obese, soft, nontender, nondistended quadrants.  No apparent ascites.  EXTREMITIES: No peripheral edema, dry skin, warm to touch HEMATOLOGIC: No significant bruising NEUROLOGIC: Oriented to person, place, and time. Nonfocal. Normal muscle tone.  PSYCHIATRIC: Normal mood and affect. Normal behavior. Cooperative  CARDIAC DATABASE: EKG: 03/16/2021: Normal sinus rhythm, 97 bpm, without underlying injury pattern.  Echocardiogram:  12/13/2019: LVEF 60 to 65%, no RWMA, RV size and function normal, no significant valvular heart disease.  14 day extended Holter monitor: Dominant rhythm normal sinus (54-178 bpm), avg. HR 90 bpm.  No atrial fibrillation, ventricular tachycardia, high grade AV block, or pauses 3 seconds or longer. Total ventricular ectopic (VE) burden <1%. Total supraventricular ectopic (SVE) burden <1%. 1 episode of SVT with aberrancy occurred on 12/20/2019 18 beats in duration at the maximum HR 141 bpm (avg 113 bpm); the run with the fastest interval was also the longest. 14 patient triggered events: Underlying rhythm normal sinus followed by sinus tachycardia. One episode associated with sinus tachycardia and SVE.  Stress Testing:  None  Heart  Catheterization: None  LABORATORY DATA: CBC Latest Ref Rng & Units 02/09/2021 12/31/2020 11/26/2019  WBC 4.0 - 10.5 K/uL 6.1 5.7 5.7  Hemoglobin 12.0 - 15.0 g/dL 11.8(L) 12.2 12.4  Hematocrit 36.0 - 46.0 % 37.0 38.1 38.3  Platelets 150 - 400 K/uL 282 286 257.0    CMP Latest Ref Rng & Units 02/09/2021 12/31/2020 11/19/2020  Glucose 70 - 99 mg/dL 108(H) 115(H) 102(H)  BUN 6 - 20 mg/dL 11 12 9   Creatinine 0.44 - 1.00 mg/dL 0.66 0.98 0.84  Sodium 135 - 145 mmol/L 139 140 141  Potassium 3.5 - 5.1 mmol/L 3.9 4.2 4.4  Chloride 98 - 111 mmol/L 103 105 103  CO2 22 - 32 mmol/L 28 24 24   Calcium 8.9 - 10.3 mg/dL 9.4 9.8 9.1  Total Protein 6.5 - 8.1 g/dL 7.4 8.0 6.7  Total Bilirubin 0.3 - 1.2 mg/dL 0.2(L) 0.3 <0.2  Alkaline Phos 38 - 126 U/L 100 128(H) 139(H)  AST 15 - 41 U/L 25 33 36  ALT 0 - 44 U/L 32 36 40(H)    Lipid Panel  No results found for: CHOL, TRIG, HDL, CHOLHDL, VLDL, LDLCALC, LDLDIRECT, LABVLDL  No components found for: NTPROBNP No results for input(s): PROBNP in the last 8760 hours. Recent Labs    11/19/20 1202  TSH 1.090    BMP Recent Labs    11/19/20 1202 12/31/20 1047 02/09/21 1246  NA 141 140 139  K 4.4 4.2 3.9  CL 103 105 103  CO2 24 24 28   GLUCOSE 102* 115* 108*  BUN 9 12 11   CREATININE 0.84 0.98 0.66  CALCIUM 9.1 9.8 9.4  GFRNONAA  --  >60 >60    HEMOGLOBIN A1C Lab Results  Component Value Date   HGBA1C 5.9 (H) 10/03/2019   MPG 123 (H) 04/08/2014    IMPRESSION:    ICD-10-CM   1. Shortness of breath  R06.02 EKG 12-Lead    2. Former smoker  Z87.891     3. Class 3 severe obesity due to excess calories without serious comorbidity with body mass index (BMI) greater than or equal to 70 in adult [facility])  E66.01    Z68.45        RECOMMENDATIONS: Diana Blair is a 40 y.o. female whose past medical history and cardiac risk factors include: gestational hypertension, morbid obesity.  Patient presents today for 1 year follow-up visit and has been  doing well from a cardiovascular standpoint except that she has noted lower extremity swelling intermittently.  She has no physical examination findings of lower extremity swelling today but did bring in a picture and which notes pitting edema on the lateral aspect of the leg.  I suspect this is most likely secondary to either chronic venous insufficiency, high salt diet, prolonged immobilization, fluid shifts that she has had significant menstrual bleeding, and other causes cannot be ruled out.  She had a recent echocardiogram which noted preserved LVEF without any significant regional wall motion abnormalities.  She  was scheduled to have a stress test done in the past but due to her insurance limitations this was not fruitful as a prior authorization was denied.  She does not have any chest pain or anginal equivalents to expedite a coronary evaluation at today's office visit.  I have asked her to discuss the causes of lower extremity swelling with her PCP.  Patient states that she is getting referred to rheumatology and possible nephrology as she is noted to have a history of protein in the urine as well.  No additional cardiovascular testing warranted at this time unless a change in clinical status.  She is more than welcome to see me sooner if needed or I will see her back on an annual basis.  FINAL MEDICATION LIST END OF ENCOUNTER: No orders of the defined types were placed in this encounter.   Medications Discontinued During This Encounter  Medication Reason   celecoxib (CELEBREX) 100 MG capsule      Current Outpatient Medications:    Biotin w/ Vitamins C & E (HAIR/SKIN/NAILS) 1250-7.5-7.5 MCG-MG-UNT CHEW, Chew by mouth., Disp: , Rfl:    cetirizine (ZYRTEC) 10 MG tablet, Take 10 mg by mouth daily., Disp: , Rfl:    Cholecalciferol (D3-1000) 25 MCG (1000 UT) capsule, Take 1,000 Units by mouth daily., Disp: , Rfl:    famotidine (PEPCID) 20 MG tablet, Take 1 tablet (20 mg total) by mouth at  bedtime. (Patient taking differently: Take 40 mg by mouth at bedtime.), Disp: 90 tablet, Rfl: 3   Ferrous Sulfate Dried (HIGH POTENCY IRON) 65 MG TABS, Take by mouth., Disp: , Rfl:    fluticasone (FLONASE) 50 MCG/ACT nasal spray, Place 2 sprays into both nostrils daily. (Patient taking differently: Place 2 sprays into both nostrils as needed.), Disp: 16 g, Rfl: 11   hydrochlorothiazide (HYDRODIURIL) 12.5 MG tablet, Take one po every 2-3 days as needed for edema, Disp: , Rfl:    medroxyPROGESTERone (PROVERA) 10 MG tablet, Take 10 mg by mouth daily., Disp: , Rfl:    methylcellulose (CITRUCEL) oral powder, Take 1 packet by mouth daily., Disp: , Rfl:    pantoprazole (PROTONIX) 40 MG tablet, TAKE 1 TABLET(40 MG) BY MOUTH DAILY, Disp: 90 tablet, Rfl: 3   Pyridoxine HCl (VITAMIN B6) 100 MG TABS, , Disp: , Rfl:    vitamin B-12 (CYANOCOBALAMIN) 1000 MCG tablet, Take 1,000 mcg by mouth daily., Disp: , Rfl:    metFORMIN (GLUCOPHAGE-XR) 500 MG 24 hr tablet, Take 500 mg by mouth daily. (Patient not taking: Reported on 03/16/2021), Disp: , Rfl:    solifenacin (VESICARE) 5 MG tablet, Take 1 tablet by mouth daily. (Patient not taking: Reported on 03/16/2021), Disp: , Rfl:   Orders Placed This Encounter  Procedures   EKG 12-Lead    There are no Patient Instructions on file for this visit.   --Continue cardiac medications as reconciled in final medication list. --Return in about 1 year (around 03/16/2022) for Follow up. Or sooner if needed. --Continue follow-up with your primary care physician regarding the management of your other chronic comorbid conditions.  Patient's questions and concerns were addressed to her satisfaction. She voices understanding of the instructions provided during this encounter.   This note was created using a voice recognition software as a result there may be grammatical errors inadvertently enclosed that do not reflect the nature of this encounter. Every attempt is made to correct  such errors.  Rex Kras, [place], [facility]  Pager: [phone] Office: [phone]

## 2021-03-16 ENCOUNTER — Encounter: Payer: Self-pay | Admitting: Cardiology

## 2021-03-16 ENCOUNTER — Ambulatory Visit: Payer: Medicaid Other | Admitting: Cardiology

## 2021-03-16 ENCOUNTER — Other Ambulatory Visit: Payer: Self-pay

## 2021-03-16 VITALS — BP 139/77 | HR 99 | Temp 97.2°F | Resp 17 | Ht 63.0 in | Wt >= 6400 oz

## 2021-03-16 DIAGNOSIS — R0602 Shortness of breath: Secondary | ICD-10-CM

## 2021-03-16 DIAGNOSIS — R002 Palpitations: Secondary | ICD-10-CM

## 2021-03-16 DIAGNOSIS — Z6841 Body Mass Index (BMI) 40.0 and over, adult: Secondary | ICD-10-CM

## 2021-03-16 DIAGNOSIS — Z87891 Personal history of nicotine dependence: Secondary | ICD-10-CM

## 2021-03-18 ENCOUNTER — Other Ambulatory Visit: Payer: Self-pay

## 2021-03-18 DIAGNOSIS — R0609 Other forms of dyspnea: Secondary | ICD-10-CM

## 2021-03-24 ENCOUNTER — Ambulatory Visit: Payer: Medicaid Other | Admitting: Obstetrics and Gynecology

## 2021-03-24 NOTE — Progress Notes (Signed)
Office Visit Note  Patient: Diana Blair             Date of Birth: [birth year]           MRN: [id number]             PCP: Berkley Harvey, NP Referring: Berkley Harvey, NP Visit Date: 03/25/2021   Subjective:  Follow-up (Not doing good)   History of Present Illness: Diana Blair is a 40 y.o. female here for follow up  for sarcoidosis with generalized weakness, pain and walking difficulty. She had seen some improvement although with good days and bad days. Some rash on the ears with cracking skin changes. She had some itching sensation around eyes on face without much visible rash improved with OTC shampoo. Uterine bleeding started heavily since October she saw family medicine clinic for this took progesterone improved this but then recurred since few days ago. She had to stop aqua therapy for now due to this problem. No recurrence of the leg indentation or visible changes.  Previous HPI 01/21/21 Diana Blair is a 40 y.o. female here for muscle pain and weakness and elevated CRP. She does not recall specific onset timing but after her pregnancy in 2018 developed or significantly worsened in symptoms. She felt generalized weakness, fatigue, exertion intolerance this went on for at least a full year without any plan. She then developed increased problems with coughing and dyspnea thought to be possible COVID and then thought to be primarily upper airway cough syndrome but eventually abnormal chest imaging with high ACE level suspected to represent sarcoidosis. She did not undergo biopsy for this although symptoms remained stable no specific indication for intervention or immunosuppression for lungs. She also experienced increase in joint pain at multiple areas most significant in bilateral hands and ankles. While taking prednisone she felt an improvement but back to baseline after stopping. She did not see particularly much swelling or erythema. There was an area of skin indentation or  dimpling on the right leg for a period of time that resolved spontaneously.     Labs reviewed 12/2020 CBC wnl CMP alk phos 128 LDH 256 SPEP IgA 517 polyclonal increase   09/2020 ANA neg RF neg ESR 45 CRP 28.8 CK 423 CMP Alk phos 139 ALT 40 IgA 566 Vit D 25 RPR neg   Imaging reviewed 01/09/20 HRCT Chest IMPRESSION: 1. Parenchymal changes in the lungs which appears similar to the prior study. These findings are nonspecific, but could certainly represent a pulmonary manifestation of sarcoidosis. 2. No definite mediastinal or hilar lymphadenopathy noted on today's noncontrast CT examination. 3. Splenomegaly.   Review of Systems  Constitutional:  Positive for fatigue.  HENT:  Negative for mouth dryness.   Eyes:  Positive for dryness.  Respiratory:  Positive for shortness of breath.   Cardiovascular:  Positive for swelling in legs/feet.  Gastrointestinal:  Positive for diarrhea.  Endocrine: Positive for cold intolerance.  Genitourinary:  Positive for vaginal bleeding.  Musculoskeletal:  Positive for gait problem, joint swelling, muscle weakness, morning stiffness and muscle tenderness.  Skin:  Negative for rash.  Allergic/Immunologic: Positive for susceptible to infections.  Neurological:  Positive for weakness.  Hematological:  Negative for bruising/bleeding tendency.  Psychiatric/Behavioral:  Positive for sleep disturbance.    PMFS History:  Patient Active Problem List   Diagnosis Date Noted   Urinary incontinence 04/02/2021   Abnormal uterine bleeding 03/25/2021   Generalized weakness 01/21/2021   Bilateral ankle pain 01/21/2021  Vitamin D deficiency 01/21/2021   Elevated CK 01/21/2021   Sleep apnea 12/09/2020   Allergic rhinitis 08/20/2020   Sarcoidosis 09/07/2019   Dysphagia 08/05/2019   Gastroesophageal reflux disease 03/20/2019   Pulmonary infiltrates on CXR 02/26/2019   Upper airway cough syndrome 02/26/2019   Advanced maternal age in multigravida  11/10/2016   Chronic hypertension in pregnancy 11/10/2016   IUGR (intrauterine growth restriction) affecting care of mother, third trimester, other fetus 11/05/2016   BMI 60.0-69.9, adult ([place]) 05/04/2016   History of preterm delivery 08/20/2015   History of cervical cerclage 08/20/2015   PCOS (polycystic ovarian syndrome) 02/08/2013    Past Medical History:  Diagnosis Date   Chronic hypertension in obstetric context in first trimester 05/04/2016   Diverticulitis    Heart palpitations    Morbid obesity with BMI of 50.0-59.9, adult ([facility])    BMI 58.8   PCOS (polycystic ovarian syndrome)    Seizure ([place])    as a child    Family History  Problem Relation Age of Onset   Cancer Mother        lung   Hypertension Mother    Diabetes Father    Heart failure Father    Hypertension Sister    Eczema Sister    Hypertension Brother    Diabetes Paternal Grandmother    Other Neg Hx    Colon cancer Neg Hx    Pancreatic cancer Neg Hx    Esophageal cancer Neg Hx    Past Surgical History:  Procedure Laterality Date   CERVICAL CERCLAGE  03/13/2012   Procedure: CERCLAGE CERVICAL;  Surgeon: Jonnie Kind, MD;  Location: [facility];  Service: Gynecology;  Laterality: N/A;   CERVICAL CERCLAGE  03/17/2012   Procedure: CERCLAGE CERVICAL;  Surgeon: Osborne Oman, MD;  Location: [facility];  Service: Gynecology;  Laterality: N/A;  Removal   COLONOSCOPY WITH PROPOFOL N/A 10/02/2020   Procedure: COLONOSCOPY WITH PROPOFOL;  Surgeon: Milus Banister, MD;  Location: [facility];  Service: Endoscopy;  Laterality: N/A;   ESOPHAGOGASTRODUODENOSCOPY (EGD) WITH PROPOFOL N/A 10/02/2020   Procedure: ESOPHAGOGASTRODUODENOSCOPY (EGD) WITH PROPOFOL;  Surgeon: Milus Banister, MD;  Location: [facility];  Service: Endoscopy;  Laterality: N/A;   POLYPECTOMY  10/02/2020   Procedure: POLYPECTOMY;  Surgeon: Milus Banister, MD;  Location: [facility];  Service: Endoscopy;;   Social History   Social History Narrative    Caffeine 2 soda in month. Sweet tea.  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  Single parent.    Immunization History  Administered Date(s) Administered   Influenza Split 05/24/2016   Tdap 05/24/2016     Objective: Vital Signs: BP 133/68 (BP Location: Right Arm, Patient Position: Sitting, Cuff Size: Large)   Pulse 98   Resp 20   Ht _0  (1.6 m)   Wt (!) 401 lb (181.9 kg)   BMI 71.03 kg/m    Physical Exam Constitutional:      Appearance: She is obese.  Eyes:     Conjunctiva/sclera: Conjunctivae normal.  Cardiovascular:     Rate and Rhythm: Normal rate and regular rhythm.  Pulmonary:     Effort: Pulmonary effort is normal.     Breath sounds: Normal breath sounds.  Skin:    General: Skin is warm and dry.     Findings: No rash.     Comments: 1+ pitting pedal edema  Neurological:     Mental Status: She is alert.  Psychiatric:  Mood and Affect: Mood normal.     Musculoskeletal Exam:  Shoulders full ROM no tenderness or swelling Elbows full ROM no tenderness or swelling Wrists full ROM no tenderness or swelling Fingers full ROM no tenderness or swelling Knees full ROM no tenderness or swelling Ankles full ROM no tenderness or swelling    Investigation: No additional findings.  Imaging: No results found.  Recent Labs: Lab Results  Component Value Date   WBC 7.2 03/25/2021   HGB 9.0 (L) 03/25/2021   PLT 330 03/25/2021   NA 139 02/09/2021   K 3.9 02/09/2021   CL 103 02/09/2021   CO2 28 02/09/2021   GLUCOSE 108 (H) 02/09/2021   BUN 11 02/09/2021   CREATININE 0.66 02/09/2021   BILITOT 0.2 (L) 02/09/2021   ALKPHOS 100 02/09/2021   AST 25 02/09/2021   ALT 32 02/09/2021   PROT 7.4 02/09/2021   ALBUMIN 3.8 02/09/2021   CALCIUM 9.4 02/09/2021   GFRAA >60 02/21/2019   QFTBGOLDPLUS NEGATIVE 02/26/2019    Speciality Comments: No specialty comments available.  Procedures:  No procedures performed Allergies:  Patient has no known allergies.   Assessment / Plan:     Visit Diagnoses: Sarcoidosis  I do not see any overt inflammatory changes on exam again today.  Labs at last visit were unremarkable for any additional evidence of an active chronic granulomatous disease systemically.  Leg rash or indentation is not recurring at this time.  We can observe for any symptom changes but no indications to start systemic immunosuppression at this time.  Generalized weakness - Plan: CK, Aldolase Elevated CK - Plan: CK, Aldolase  She was experiencing some weakness improvement with the physical therapy work.  However this is doing a bit worse now could be related to anemia and with having to stop her exercises.  We will also recheck CK and aldolase with a mild persistent CK elevation seen on the previous labs but do not think extensive work-up is needed unless trending worse.  Abnormal uterine bleeding - Plan: CBC with Differential/Platelet  Is describing significant large clotting and blood vaginally she already has planned follow-up scheduled with OB/GYN clinic for this. Will repeat CBC as well to reassess her anemia.  Orders: Orders Placed This Encounter  Procedures   CBC with Differential/Platelet   CK   Aldolase    No orders of the defined types were placed in this encounter.    Follow-Up Instructions: Return in about 6 months (around 09/22/2021) for Sarcoidosis/?myopathy obs f/u 41mo.   CCollier Salina MD  Note - This record has been created using [employer]  Chart creation errors have been sought, but may not always  have been located. Such creation errors do not reflect on  the standard of medical care.

## 2021-03-25 ENCOUNTER — Ambulatory Visit (INDEPENDENT_AMBULATORY_CARE_PROVIDER_SITE_OTHER): Payer: Medicaid Other | Admitting: Internal Medicine

## 2021-03-25 ENCOUNTER — Ambulatory Visit: Payer: Medicaid Other | Admitting: Family Medicine

## 2021-03-25 ENCOUNTER — Other Ambulatory Visit: Payer: Self-pay

## 2021-03-25 ENCOUNTER — Encounter: Payer: Self-pay | Admitting: Internal Medicine

## 2021-03-25 VITALS — BP 133/68 | HR 98 | Resp 20 | Ht 63.0 in | Wt >= 6400 oz

## 2021-03-25 DIAGNOSIS — R748 Abnormal levels of other serum enzymes: Secondary | ICD-10-CM

## 2021-03-25 DIAGNOSIS — D869 Sarcoidosis, unspecified: Secondary | ICD-10-CM

## 2021-03-25 DIAGNOSIS — R531 Weakness: Secondary | ICD-10-CM

## 2021-03-25 DIAGNOSIS — N939 Abnormal uterine and vaginal bleeding, unspecified: Secondary | ICD-10-CM | POA: Diagnosis not present

## 2021-03-26 LAB — CK: Total CK: 293 U/L — ABNORMAL HIGH (ref 29–143)

## 2021-03-26 LAB — CBC WITH DIFFERENTIAL/PLATELET
Absolute Monocytes: 641 cells/uL (ref 200–950)
Basophils Absolute: 22 cells/uL (ref 0–200)
Basophils Relative: 0.3 %
Eosinophils Absolute: 130 cells/uL (ref 15–500)
Eosinophils Relative: 1.8 %
HCT: 30.2 % — ABNORMAL LOW (ref 35.0–45.0)
Hemoglobin: 9 g/dL — ABNORMAL LOW (ref 11.7–15.5)
Lymphs Abs: 907 cells/uL (ref 850–3900)
MCH: 24.2 pg — ABNORMAL LOW (ref 27.0–33.0)
MCHC: 29.8 g/dL — ABNORMAL LOW (ref 32.0–36.0)
MCV: 81.2 fL (ref 80.0–100.0)
MPV: 10.2 fL (ref 7.5–12.5)
Monocytes Relative: 8.9 %
Neutro Abs: 5501 cells/uL (ref 1500–7800)
Neutrophils Relative %: 76.4 %
Platelets: 330 10*3/uL (ref 140–400)
RBC: 3.72 10*6/uL — ABNORMAL LOW (ref 3.80–5.10)
RDW: 17 % — ABNORMAL HIGH (ref 11.0–15.0)
Total Lymphocyte: 12.6 %
WBC: 7.2 10*3/uL (ref 3.8–10.8)

## 2021-03-26 LAB — ALDOLASE: Aldolase: 6.8 U/L (ref <=8.1)

## 2021-03-30 NOTE — Progress Notes (Signed)
The CK is about the same, slightly decreased to 293 from 326. I don't see any evidence concerning for an inflammatory problem currently. However her hemoglobin is 9.0 down from 11.8 this is anemia, consistent with the reported uterine bleeding. She definitely needs to follow up as planned with OBGYN for this.

## 2021-04-01 ENCOUNTER — Ambulatory Visit (INDEPENDENT_AMBULATORY_CARE_PROVIDER_SITE_OTHER): Payer: Medicaid Other | Admitting: Obstetrics and Gynecology

## 2021-04-01 ENCOUNTER — Other Ambulatory Visit: Payer: Self-pay

## 2021-04-01 ENCOUNTER — Encounter: Payer: Self-pay | Admitting: Obstetrics and Gynecology

## 2021-04-01 VITALS — BP 133/75 | HR 101 | Wt >= 6400 oz

## 2021-04-01 DIAGNOSIS — N3946 Mixed incontinence: Secondary | ICD-10-CM

## 2021-04-01 DIAGNOSIS — N939 Abnormal uterine and vaginal bleeding, unspecified: Secondary | ICD-10-CM | POA: Diagnosis not present

## 2021-04-01 DIAGNOSIS — E282 Polycystic ovarian syndrome: Secondary | ICD-10-CM | POA: Diagnosis not present

## 2021-04-01 DIAGNOSIS — Z6841 Body Mass Index (BMI) 40.0 and over, adult: Secondary | ICD-10-CM

## 2021-04-01 NOTE — Progress Notes (Signed)
CC: irregular bleeding with clots Subjective:    Patient ID: Diana Blair, female    DOB: 1980/09/08, 40 y.o.   MRN: [id number]  HPI G2P2, SVD x 2 seen for discussion of heavy irregular bleeding and passing clots.  She notes long history of PCOS with occasional 1-3 month intervals with no menses followed by heavy menstruation with clots.  She also complains of urinary and occasionally fecal incontinence.  She states she was previously seen by urology, but no urodynamics was done.    Discussed with patient that her severe obesity is likely contributing to her PCOS and irregular menstruation.  Also discussed she is at high risk for endometrial hyperplasia and or endometrial cancer.  Pt needs an endometrial biopsy.  There was some resistance from the patient with this diagnosis.  She requests a medication to help with the bleeding.  Review of the chart showed she has received megace in the past.  Explained to patient this is not a long term cure, but more of a temporizing measure while she could attempt to lose weight.   Review of Systems  Genitourinary:  Positive for menstrual problem and vaginal bleeding.      Objective:   Physical Exam Vitals:   04/01/21 1056  BP: 133/75  Pulse: (!) 101   CLINICAL DATA:  Abnormal uterine bleeding, LMP 02/02/2021   EXAM:  TRANSABDOMINAL ULTRASOUND OF PELVIS   TECHNIQUE:  Transabdominal ultrasound examination of the pelvis was performed  including evaluation of the uterus, ovaries, adnexal regions, and  pelvic cul-de-sac. Transvaginal imaging was not ordered.   COMPARISON:  None   FINDINGS:  Uterus   Measurements: 10.1 x 5.3 x 7.0 cm = volume: 196 mL. Anteverted.  Question subtle subserosal leiomyoma at anterior upper uterus,  ill-defined, question 3.0 x 1.7 x 2.4 cm.   Endometrium   Thickness: 18 mm.  No endometrial fluid or mass   Right ovary   Measurements: 4.7 x 1.9 x 1.9 cm = volume: 8.7 mL. Normal morphology  without mass    Left ovary   Measurements: 3.6 x 2.4 x 2.3 cm = volume: 10.2 mL. Normal  morphology without mass   Other findings:  No free pelvic fluid.  No adnexal masses.   IMPRESSION:  Question subtle anterior wall subserosal leiomyoma at anterior upper  uterus.   Thickened endometrial complex 18 mm thick; if bleeding remains  unresponsive to hormonal or medical therapy, focal lesion work-up  with sonohysterogram should be considered. Endometrial biopsy should  also be considered in pre-menopausal patients at high risk for  endometrial carcinoma. (Ref: Radiological Reasoning: Algorithmic  Workup of Abnormal Vaginal Bleeding with Endovaginal Sonography and  Sonohysterography. AJR 2008; 413:K44-01)       Assessment & Plan:   1. Abnormal uterine bleeding Pt will receive endometrial biopsy in 1-2 weeks, can start continuous megace after biopsy performed  2. PCOS (polycystic ovarian syndrome) Advise at least 10% weight loss or greater to start treatment.  Pt was on metformin, but self discontinued and is no longer taking it.  3. BMI 60.0-69.9, adult ([place])   4. Mixed stress and urge urinary incontinence Will refer to urogyn for further testing.  By history she appears to have a mixed incontinence picture. - Ambulatory referral to Urogynecology I spent 40 minutes dedicated to the care of this patient including previsit review of records, face to face time with the patient discussing etiology, disease course, treatment options and post visit testing.    Griffin Basil,  MD Faculty Attending, [facility]

## 2021-04-02 DIAGNOSIS — R32 Unspecified urinary incontinence: Secondary | ICD-10-CM | POA: Insufficient documentation

## 2021-04-16 ENCOUNTER — Ambulatory Visit (INDEPENDENT_AMBULATORY_CARE_PROVIDER_SITE_OTHER): Payer: Medicaid Other | Admitting: Obstetrics and Gynecology

## 2021-04-16 ENCOUNTER — Other Ambulatory Visit: Payer: Self-pay

## 2021-04-16 ENCOUNTER — Other Ambulatory Visit (HOSPITAL_COMMUNITY)
Admission: RE | Admit: 2021-04-16 | Discharge: 2021-04-16 | Disposition: A | Discharge status: Home / Self Care | Payer: Medicaid Other | Source: Ambulatory Visit | Attending: Obstetrics and Gynecology | Admitting: Obstetrics and Gynecology

## 2021-04-16 ENCOUNTER — Encounter: Payer: Self-pay | Admitting: Obstetrics and Gynecology

## 2021-04-16 DIAGNOSIS — Z3202 Encounter for pregnancy test, result negative: Secondary | ICD-10-CM

## 2021-04-16 DIAGNOSIS — N939 Abnormal uterine and vaginal bleeding, unspecified: Secondary | ICD-10-CM

## 2021-04-16 LAB — POCT PREGNANCY, URINE: Preg Test, Ur: NEGATIVE

## 2021-04-16 NOTE — Progress Notes (Signed)
ENDOMETRIAL BIOPSY      Diana Blair is a 40 y.o. G2P1101 here for endometrial biopsy.  The indications for endometrial biopsy were reviewed.  Risks of the biopsy including cramping, bleeding, infection, uterine perforation, inadequate specimen and need for additional procedures were discussed. The patient states she understands and agrees to undergo procedure today. Consent was signed. Time out was performed.   Indications: DUB, concern for endometrial hyperplasia Urine HCG: neg  A bivalve speculum was placed into the vagina and the cervix was easily visualized and was prepped with Betadine x2. A single-toothed tenaculum was placed on the anterior lip of the cervix to stabilize it. The 3 mm pipelle was introduced into the endometrial cavity without difficulty to a depth of 9.5 cm, and a moderate amount of tissue was obtained and sent to pathology. This was repeated for a total of 3 passes. The instruments were removed from the patient's vagina. Minimal bleeding from the cervix at the tenaculum was noted.   The patient tolerated the procedure well. Routine post-procedure instructions were given to the patient.    Will base further management on results of biopsy. Discussed potential therapy going forward.  Pt stated she is in fact willing to take metformin.  I agree with her decision as it may lower insulin resistance and allow for normal menstrual cycling.  Again emphasized weight loss as part of this solution.    Will have virtual visit in 4 months.  If pt continues to have long intervals between menses or prolonged bleeding, may still need cyclic progesterone therapy.  Lynnda Shields, MD

## 2021-04-21 LAB — SURGICAL PATHOLOGY

## 2021-04-22 ENCOUNTER — Ambulatory Visit: Payer: Medicaid Other | Admitting: Certified Nurse Midwife

## 2021-05-13 ENCOUNTER — Ambulatory Visit (INDEPENDENT_AMBULATORY_CARE_PROVIDER_SITE_OTHER): Payer: Medicaid Other | Admitting: Family Medicine

## 2021-05-13 ENCOUNTER — Encounter (INDEPENDENT_AMBULATORY_CARE_PROVIDER_SITE_OTHER): Payer: Self-pay | Admitting: Family Medicine

## 2021-05-13 ENCOUNTER — Other Ambulatory Visit: Payer: Self-pay

## 2021-05-13 VITALS — BP 118/72 | HR 75 | Temp 98.5°F | Ht 63.0 in | Wt 394.0 lb

## 2021-05-13 DIAGNOSIS — R0602 Shortness of breath: Secondary | ICD-10-CM | POA: Diagnosis not present

## 2021-05-13 DIAGNOSIS — R5383 Other fatigue: Secondary | ICD-10-CM

## 2021-05-13 DIAGNOSIS — E538 Deficiency of other specified B group vitamins: Secondary | ICD-10-CM

## 2021-05-13 DIAGNOSIS — E559 Vitamin D deficiency, unspecified: Secondary | ICD-10-CM

## 2021-05-13 DIAGNOSIS — R739 Hyperglycemia, unspecified: Secondary | ICD-10-CM | POA: Diagnosis not present

## 2021-05-13 DIAGNOSIS — D508 Other iron deficiency anemias: Secondary | ICD-10-CM

## 2021-05-13 DIAGNOSIS — E7849 Other hyperlipidemia: Secondary | ICD-10-CM

## 2021-05-13 DIAGNOSIS — G4733 Obstructive sleep apnea (adult) (pediatric): Secondary | ICD-10-CM | POA: Diagnosis not present

## 2021-05-13 DIAGNOSIS — E669 Obesity, unspecified: Secondary | ICD-10-CM

## 2021-05-13 DIAGNOSIS — Z6841 Body Mass Index (BMI) 40.0 and over, adult: Secondary | ICD-10-CM

## 2021-05-13 NOTE — Progress Notes (Signed)
Dear Eldridge Abrahams, NP,   Thank you for referring Diana Blair to our clinic. The following note includes my evaluation and treatment recommendations.  Chief Complaint:   OBESITY Diana Blair (MR# [id number]) is a 41 y.o. female who presents for evaluation and treatment of obesity and related comorbidities. Current BMI is Body mass index is 69.79 kg/m. Diana Blair has been struggling with her weight for many years and has been unsuccessful in either losing weight, maintaining weight loss, or reaching her healthy weight goal.  Diana Blair is currently in the action stage of change and ready to dedicate time achieving and maintaining a healthier weight. Diana Blair is interested in becoming our patient and working on intensive lifestyle modifications including (but not limited to) diet and exercise for weight loss.  Referred by Eldridge Abrahams, NP. Pt doesn't cook much at home. She has had significant health issues over the last 2 years. She occasionally eats out. Skipping breakfast and lunch almost always; Egg salad sandwich Pershing Proud) + pickles (satisfied) with water + soda; Dinner- Geneticist, molecular with vegetables + rice (1.5 cup) (felt full); may cook spaghetti or chicken (fried or baked). If she isn't eating, she is drinking soda or juice.  Diana Blair's habits were reviewed today and are as follows: Her family eats meals together, she thinks her family will eat healthier with her, her desired weight loss is 194 lbs, she has been heavy most of her life, she started gaining weight in early childhood, her heaviest weight ever was 405 pounds, she has significant food cravings issues, she skips meals frequently, she is frequently drinking liquids with calories, she frequently makes poor food choices, she frequently eats larger portions than normal, and she has binge eating behaviors.  Depression Screen Zyon's Food and Mood (modified PHQ-9) score was 10.  Depression screen Diana Blair 2/9 05/13/2021  Decreased  Interest 0  Down, Depressed, Hopeless 1  PHQ - 2 Score 1  Altered sleeping 2  Tired, decreased energy 3  Change in appetite 0  Feeling bad or failure about yourself  0  Trouble concentrating 1  Moving slowly or fidgety/restless 3  Suicidal thoughts 0  PHQ-9 Score 10  Difficult doing work/chores Extremely dIfficult  Some recent data might be hidden   Subjective:   1. Other fatigue Diana Blair admits to daytime somnolence and admits to waking up still tired. Patent has a history of symptoms of daytime fatigue and morning fatigue. Diana Blair generally gets 5 or 6 hours of sleep per night, and states that she has poor sleep quality. Snoring "unsure" present. Apneic episodes are present. Epworth Sleepiness Score is 8. EKG- sinus rhythm with T-wave abnormality II and aVF (seen in prior EKG).  2. SOBOE (shortness of breath on exertion) Diana Blair notes increasing shortness of breath with exercising and seems to be worsening over time with weight gain. She notes getting out of breath sooner with activity than she used to. This has gotten worse recently. Cabela denies shortness of breath at rest or orthopnea.  3. Other iron deficiency anemia Significant uterine bleeding previously. Pt's las H/H was 9/30.2 and MCV 81.2. She is on PO iron.  4. Elevated blood sugar Pt has had elevated blood sugars for 5 years. H/o pre-diabetes and PCOS.  5. Obstructive sleep apnea syndrome Pt does not need CPAP (mild apnea).  6. Vitamin D deficiency Pt is on OTC Vit D 1K IU/day and reports fatigue.  7. B12 deficiency Historical diagnosis. Pt is on OTC B12 supplement. She reports fatigue.  8. Other hyperlipidemia Pt has an LDL of 140, HDL 40, and triglycerides 86.  Assessment/Plan:   1. Other fatigue Diana Blair does feel that her weight is causing her energy to be lower than it should be. Fatigue may be related to obesity, depression or many other causes. Labs will be ordered, and in the meanwhile, Diana Blair  will focus on self care including making healthy food choices, increasing physical activity and focusing on stress reduction. Check labs today.  - EKG 12-Lead - T3 - T4, free - TSH  2. SOBOE (shortness of breath on exertion) Diana Blair does feel that she gets out of breath more easily that she used to when she exercises. Diana Blair's shortness of breath appears to be obesity related and exercise induced. She has agreed to work on weight loss and gradually increase exercise to treat her exercise induced shortness of breath. Will continue to monitor closely.  3. Other iron deficiency anemia Orders and follow up as documented in patient record. CBC today.  Counseling Iron is essential for our bodies to make red blood cells.  Reasons that someone may be deficient include: an iron-deficient diet (more likely in those following vegan or vegetarian diets), women with heavy menses, patients with GI disorders or poor absorption, patients that have had bariatric surgery, frequent blood donors, patients with cancer, and patients with heart disease.   Iron-rich foods include dark leafy greens, red and white meats, eggs, seafood, and beans.   Certain foods and drinks prevent your body from absorbing iron properly. Avoid eating these foods in the same meal as iron-rich foods or with iron supplements. These foods include: coffee, black tea, and red wine; milk, dairy products, and foods that are high in calcium; beans and soybeans; whole grains.  Constipation can be a side effect of iron supplementation. Increased water and fiber intake are helpful. Water goal: > 2 liters/day. Fiber goal: > 25 grams/day.  4. Elevated blood sugar Check labs today.  - Comprehensive metabolic panel - Insulin, random  5. Obstructive sleep apnea syndrome Intensive lifestyle modifications are the first line treatment for this issue. We discussed several lifestyle modifications today and she will continue to work on diet, exercise  and weight loss efforts. We will continue to monitor. Orders and follow up as documented in patient record. Follow up on any symptoms at next appt.  6. Vitamin D deficiency Low Vitamin D level contributes to fatigue and are associated with obesity, breast, and colon cancer. She agrees to continue to take OTC Vitamin D 1,000 IU daily and will follow-up for routine testing of Vitamin D, at least 2-3 times per year to avoid over-replacement. Check labs today.  - VITAMIN D 25 Hydroxy (Vit-D Deficiency, Fractures)  7. B12 deficiency The diagnosis was reviewed with the patient. Counseling provided today, see below. We will continue to monitor. Orders and follow up as documented in patient record.  Counseling The body needs vitamin B12: to make red blood cells; to make DNA; and to help the nerves work properly so they can carry messages from the brain to the body.  The main causes of vitamin B12 deficiency include dietary deficiency, digestive diseases, pernicious anemia, and having a surgery in which part of the stomach or small intestine is removed.  Certain medicines can make it harder for the body to absorb vitamin B12. These medicines include: heartburn medications; some antibiotics; some medications used to treat diabetes, gout, and high cholesterol.  In some cases, there are no symptoms of this condition.  If the condition leads to anemia or nerve damage, various symptoms can occur, such as weakness or fatigue, shortness of breath, and numbness or tingling in your hands and feet.   Treatment:  May include taking vitamin B12 supplements.  Avoid alcohol.  Eat lots of healthy foods that contain vitamin B12: Beef, pork, chicken, [place], and organ meats, such as liver.  Seafood: This includes clams, rainbow trout, salmon, tuna, and haddock. Eggs.  Cereal and dairy products that are fortified: This means that vitamin B12 has been added to the food.  Check labs today.  - Vitamin B12  8. Other  hyperlipidemia Cardiovascular risk and specific lipid/LDL goals reviewed.  We discussed several lifestyle modifications today and Diana Blair will continue to work on diet, exercise and weight loss efforts. Orders and follow up as documented in patient record.   Counseling Intensive lifestyle modifications are the first line treatment for this issue. Dietary changes: Increase soluble fiber. Decrease simple carbohydrates. Exercise changes: Moderate to vigorous-intensity aerobic activity 150 minutes per week if tolerated. Lipid-lowering medications: see documented in medical record. Check labs today.  - Lipid Panel With LDL/HDL Ratio  9. Obesity with current BMI of 69.9  Diana Blair is currently in the action stage of change and her goal is to continue with weight loss efforts. I recommend Diana Blair begin the structured treatment plan as follows:  She has agreed to follow Cat 4 with option to journal with a calorie allottment of 1650-1800 calories with a goal of 125 or more grams of protein for the day..  Exercise goals: No exercise has been prescribed at this time.   Behavioral modification strategies: increasing lean protein intake, meal planning and cooking strategies, keeping healthy foods in the home, and planning for success.  She was informed of the importance of frequent follow-up visits to maximize her success with intensive lifestyle modifications for her multiple health conditions. She was informed we would discuss her lab results at her next visit unless there is a critical issue that needs to be addressed sooner. Diana Blair agreed to keep her next visit at the agreed upon time to discuss these results.  Objective:   Blood pressure 118/72, pulse 75, temperature 98.5 F (36.9 C), height 5\' 3"  (1.6 m), weight (!) 394 lb (178.7 kg), last menstrual period 04/27/2021, SpO2 99 %. Body mass index is 69.79 kg/m.  EKG: Abnormal- sinus rhythm, rate 84.  Indirect Calorimeter completed today  shows a VO2 of 297 and a REE of 2045.  Her calculated basal metabolic rate is 8144 thus her basal metabolic rate is better than expected.  General: Cooperative, alert, well developed, in no acute distress. HEENT: Conjunctivae and lids unremarkable. Cardiovascular: Regular rhythm.  Lungs: Normal work of breathing. Neurologic: No focal deficits.   Lab Results  Component Value Date   CREATININE 0.66 02/09/2021   BUN 11 02/09/2021   NA 139 02/09/2021   K 3.9 02/09/2021   CL 103 02/09/2021   CO2 28 02/09/2021   Lab Results  Component Value Date   ALT 32 02/09/2021   AST 25 02/09/2021   ALKPHOS 100 02/09/2021   BILITOT 0.2 (L) 02/09/2021   Lab Results  Component Value Date   HGBA1C 5.9 (H) 10/03/2019   HGBA1C 5.9 (H) 04/08/2014   HGBA1C 5.7 (H) 10/11/2012   HGBA1C 5.7 01/30/2009   No results found for: INSULIN Lab Results  Component Value Date   TSH 1.090 11/19/2020   No results found for: CHOL, HDL, LDLCALC, LDLDIRECT, TRIG, CHOLHDL Lab  Results  Component Value Date   WBC 7.2 03/25/2021   HGB 9.0 (L) 03/25/2021   HCT 30.2 (L) 03/25/2021   MCV 81.2 03/25/2021   PLT 330 03/25/2021    Attestation Statements:   Reviewed by clinician on day of visit: allergies, medications, problem list, medical history, surgical history, family history, social history, and previous encounter notes.  Time spent on visit including pre-visit chart review and post-visit charting and care was 60 minutes.   Coral Ceo, CMA, am acting as transcriptionist for Coralie Common, MD.  This is the patient's first visit at [facility]. The patient's NEW PATIENT PACKET was reviewed at length. Included in the packet: current and past health history, medications, allergies, ROS, gynecologic history (women only), surgical history, family history, social history, weight history, weight loss surgery history (for those that have had weight loss surgery), nutritional evaluation, mood  and food questionnaire, PHQ9, Epworth questionnaire, sleep habits questionnaire, patient life and health improvement goals questionnaire. These will all be scanned into the patient's chart under media.   During the visit, I independently reviewed the patient's EKG, bioimpedance scale results, and indirect calorimeter results. I used this information to tailor a meal plan for the patient that will help her to lose weight and will improve her obesity-related conditions going forward. I performed a medically necessary appropriate examination and/or evaluation. I discussed the assessment and treatment plan with the patient. The patient was provided an opportunity to ask questions and all were answered. The patient agreed with the plan and demonstrated an understanding of the instructions. Labs were ordered at this visit and will be reviewed at the next visit unless more critical results need to be addressed immediately. Clinical information was updated and documented in the EMR.   Time spent on visit including pre-visit chart review and post-visit care was 60 minutes.   A separate 15 minutes was spent on risk counseling (see above).   I have reviewed the above documentation for accuracy and completeness, and I agree with the above. - Coralie Common, MD

## 2021-05-14 LAB — LIPID PANEL WITH LDL/HDL RATIO
Cholesterol, Total: 172 mg/dL (ref 100–199)
HDL: 40 mg/dL (ref >39)
LDL Chol Calc (NIH): 120 mg/dL — ABNORMAL HIGH (ref 0–99)
LDL/HDL Ratio: 3 ratio (ref 0.0–3.2)
Triglycerides: 63 mg/dL (ref 0–149)
VLDL Cholesterol Cal: 12 mg/dL (ref 5–40)

## 2021-05-14 LAB — COMPREHENSIVE METABOLIC PANEL
ALT: 29 IU/L (ref 0–32)
AST: 30 IU/L (ref 0–40)
Albumin/Globulin Ratio: 1.2 (ref 1.2–2.2)
Albumin: 3.9 g/dL (ref 3.8–4.8)
Alkaline Phosphatase: 125 IU/L — ABNORMAL HIGH (ref 44–121)
BUN/Creatinine Ratio: 10 (ref 9–23)
BUN: 9 mg/dL (ref 6–20)
Bilirubin Total: <0.2 mg/dL (ref 0.0–1.2)
CO2: 21 mmol/L (ref 20–29)
Calcium: 9.2 mg/dL (ref 8.7–10.2)
Chloride: 101 mmol/L (ref 96–106)
Creatinine, Ser: 0.87 mg/dL (ref 0.57–1.00)
Globulin, Total: 3.2 g/dL (ref 1.5–4.5)
Glucose: 108 mg/dL — ABNORMAL HIGH (ref 70–99)
Potassium: 4.4 mmol/L (ref 3.5–5.2)
Sodium: 138 mmol/L (ref 134–144)
Total Protein: 7.1 g/dL (ref 6.0–8.5)
eGFR: 87 mL/min/{1.73_m2} (ref >59)

## 2021-05-14 LAB — T4, FREE: Free T4: 1.07 ng/dL (ref 0.82–1.77)

## 2021-05-14 LAB — TSH: TSH: 0.657 u[IU]/mL (ref 0.450–4.500)

## 2021-05-14 LAB — INSULIN, RANDOM: INSULIN: 54.1 u[IU]/mL — ABNORMAL HIGH (ref 2.6–24.9)

## 2021-05-14 LAB — T3: T3, Total: 106 ng/dL (ref 71–180)

## 2021-05-14 LAB — VITAMIN B12: Vitamin B-12: 772 pg/mL (ref 232–1245)

## 2021-05-14 LAB — VITAMIN D 25 HYDROXY (VIT D DEFICIENCY, FRACTURES): Vit D, 25-Hydroxy: 21.8 ng/mL — ABNORMAL LOW (ref 30.0–100.0)
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
## 2021-05-19 ENCOUNTER — Other Ambulatory Visit: Payer: Self-pay

## 2021-05-19 ENCOUNTER — Encounter: Payer: Self-pay | Admitting: Obstetrics and Gynecology

## 2021-05-19 ENCOUNTER — Ambulatory Visit (INDEPENDENT_AMBULATORY_CARE_PROVIDER_SITE_OTHER): Payer: Medicaid Other | Admitting: Obstetrics and Gynecology

## 2021-05-19 VITALS — BP 129/80 | HR 73 | Ht 63.0 in | Wt 394.0 lb

## 2021-05-19 DIAGNOSIS — N3281 Overactive bladder: Secondary | ICD-10-CM | POA: Diagnosis not present

## 2021-05-19 DIAGNOSIS — N8110 Cystocele, unspecified: Secondary | ICD-10-CM | POA: Diagnosis not present

## 2021-05-19 DIAGNOSIS — R350 Frequency of micturition: Secondary | ICD-10-CM | POA: Diagnosis not present

## 2021-05-19 DIAGNOSIS — N816 Rectocele: Secondary | ICD-10-CM

## 2021-05-19 LAB — POCT URINALYSIS DIPSTICK
Appearance: NORMAL
Bilirubin, UA: NEGATIVE
Blood, UA: NEGATIVE
Glucose, UA: NEGATIVE
Ketones, UA: NEGATIVE
Leukocytes, UA: NEGATIVE
Nitrite, UA: NEGATIVE
Protein, UA: NEGATIVE
Spec Grav, UA: >=1.03 — AB (ref 1.010–1.025)
Urobilinogen, UA: 0.2 E.U./dL
pH, UA: 6 (ref 5.0–8.0)

## 2021-05-19 NOTE — Patient Instructions (Signed)

## 2021-05-22 ENCOUNTER — Telehealth: Payer: Self-pay | Admitting: Emergency Medicine

## 2021-05-22 NOTE — Telephone Encounter (Signed)
Called and spoke with patient to let her know that Dr. Lamonte Sakai would like for her to get scheduled with NP for acute sx and then scheduled with RB first available. Patient has been scheduled for Mychart video visit with Roxan Diesel on 05/26/21 and then scheduled for OV with Dr. Lamonte Sakai on 06/11/21. Nothing further needed at this time.

## 2021-05-22 NOTE — Telephone Encounter (Signed)
Primary Pulmonologist: Dr. Lamonte Sakai  Last office visit and with whom: 08/20/2020 Dr. Lamonte Sakai What do we see them for (pulmonary problems): Upper airway cough syndrome, Allergic rhinitis  Last OV assessment/plan: see below  Was appointment offered to patient (explain)?  no   Reason for call: Patient states she has a persistent dry cough that started in early January. Some wheezing. No fevers. Neg covid test. Neg flu test.  Took Dayquil but was bothering her acid reflux so she stopped taking. Patient states that she takes zyrtec and flonase daily. She would like to know if she can have prednisone called in for her.   Patient would also like to know if Dr. Lamonte Sakai would like her to make an appt as she recently saw a provider who was testing to see if she was having a sarcoidosis flare up.   Dr. Lamonte Sakai please advise   No Known Allergies  Immunization History  Administered Date(s) Administered   Influenza Split 05/24/2016   Tdap 05/24/2016     Assessment/Plan:    1. Other fatigue Diana Blair does feel that her weight is causing her energy to be lower than it should be. Fatigue may be related to obesity, depression or many other causes. Labs will be ordered, and in the meanwhile, Diana Blair will focus on self care including making healthy food choices, increasing physical activity and focusing on stress reduction. Check labs today.   - EKG 12-Lead - T3 - T4, free - TSH   2. SOBOE (shortness of breath on exertion) Diana Blair does feel that she gets out of breath more easily that she used to when she exercises. Diana Blair's shortness of breath appears to be obesity related and exercise induced. She has agreed to work on weight loss and gradually increase exercise to treat her exercise induced shortness of breath. Will continue to monitor closely.   3. Other iron deficiency anemia Orders and follow up as documented in patient record. CBC today.   Counseling Iron is essential for our bodies to make red  blood cells.  Reasons that someone may be deficient include: an iron-deficient diet (more likely in those following vegan or vegetarian diets), women with heavy menses, patients with GI disorders or poor absorption, patients that have had bariatric surgery, frequent blood donors, patients with cancer, and patients with heart disease.   Iron-rich foods include dark leafy greens, red and white meats, eggs, seafood, and beans.   Certain foods and drinks prevent your body from absorbing iron properly. Avoid eating these foods in the same meal as iron-rich foods or with iron supplements. These foods include: coffee, black tea, and red wine; milk, dairy products, and foods that are high in calcium; beans and soybeans; whole grains.  Constipation can be a side effect of iron supplementation. Increased water and fiber intake are helpful. Water goal: > 2 liters/day. Fiber goal: > 25 grams/day.   4. Elevated blood sugar Check labs today.   - Comprehensive metabolic panel - Insulin, random   5. Obstructive sleep apnea syndrome Intensive lifestyle modifications are the first line treatment for this issue. We discussed several lifestyle modifications today and she will continue to work on diet, exercise and weight loss efforts. We will continue to monitor. Orders and follow up as documented in patient record. Follow up on any symptoms at next appt.   6. Vitamin D deficiency Low Vitamin D level contributes to fatigue and are associated with obesity, breast, and colon cancer. She agrees to continue to take OTC Vitamin D  1,000 IU daily and will follow-up for routine testing of Vitamin D, at least 2-3 times per year to avoid over-replacement. Check labs today.   - VITAMIN D 25 Hydroxy (Vit-D Deficiency, Fractures)   7. B12 deficiency The diagnosis was reviewed with the patient. Counseling provided today, see below. We will continue to monitor. Orders and follow up as documented in patient record.    Counseling The body needs vitamin B12: to make red blood cells; to make DNA; and to help the nerves work properly so they can carry messages from the brain to the body.  The main causes of vitamin B12 deficiency include dietary deficiency, digestive diseases, pernicious anemia, and having a surgery in which part of the stomach or small intestine is removed.  Certain medicines can make it harder for the body to absorb vitamin B12. These medicines include: heartburn medications; some antibiotics; some medications used to treat diabetes, gout, and high cholesterol.  In some cases, there are no symptoms of this condition. If the condition leads to anemia or nerve damage, various symptoms can occur, such as weakness or fatigue, shortness of breath, and numbness or tingling in your hands and feet.   Treatment:  May include taking vitamin B12 supplements.  Avoid alcohol.  Eat lots of healthy foods that contain vitamin B12: Beef, pork, chicken, [place], and organ meats, such as liver.  Seafood: This includes clams, rainbow trout, salmon, tuna, and haddock. Eggs.  Cereal and dairy products that are fortified: This means that vitamin B12 has been added to the food.  Check labs today.   - Vitamin B12   8. Other hyperlipidemia Cardiovascular risk and specific lipid/LDL goals reviewed.  We discussed several lifestyle modifications today and Diana Blair will continue to work on diet, exercise and weight loss efforts. Orders and follow up as documented in patient record.    Counseling Intensive lifestyle modifications are the first line treatment for this issue. Dietary changes: Increase soluble fiber. Decrease simple carbohydrates. Exercise changes: Moderate to vigorous-intensity aerobic activity 150 minutes per week if tolerated. Lipid-lowering medications: see documented in medical record. Check labs today.   - Lipid Panel With LDL/HDL Ratio   9. Obesity with current BMI of 69.9   Diana Blair is  currently in the action stage of change and her goal is to continue with weight loss efforts. I recommend Diana Blair begin the structured treatment plan as follows:     Exercise goals: No exercise has been prescribed at this time.    Behavioral modification strategies: increasing lean protein intake, meal planning and cooking strategies, keeping healthy foods in the home, and planning for success.   She was informed of the importance of frequent follow-up visits to maximize her success with intensive lifestyle modifications for her multiple health conditions. She was informed we would discuss her lab results at her next visit unless there is a critical issue that needs to be addressed sooner. Diana Blair agreed to keep her next visit at the agreed upon time to discuss these results.

## 2021-05-22 NOTE — Telephone Encounter (Signed)
Agree that she needs to be seen before we prescribe prednisone.  Set her up with APP to troubleshoot acute sx, and alsop set her up w RB next available. Thank you

## 2021-05-25 ENCOUNTER — Telehealth: Payer: Self-pay | Admitting: Emergency Medicine

## 2021-05-25 NOTE — Telephone Encounter (Signed)
Called and spoke with pt about her upcoming visit tomorrow and answered questions that pt had. Nothing further needed.

## 2021-05-25 NOTE — Telephone Encounter (Signed)
Attempted to call pt but line went directly to VM. Left message for her to return call. °

## 2021-05-26 ENCOUNTER — Encounter: Payer: Self-pay | Admitting: Nurse Practitioner

## 2021-05-26 ENCOUNTER — Telehealth: Payer: Self-pay | Admitting: Nurse Practitioner

## 2021-05-26 ENCOUNTER — Telehealth (INDEPENDENT_AMBULATORY_CARE_PROVIDER_SITE_OTHER): Payer: Medicaid Other | Admitting: Nurse Practitioner

## 2021-05-26 ENCOUNTER — Other Ambulatory Visit: Payer: Self-pay

## 2021-05-26 DIAGNOSIS — R058 Other specified cough: Secondary | ICD-10-CM

## 2021-05-26 DIAGNOSIS — R918 Other nonspecific abnormal finding of lung field: Secondary | ICD-10-CM | POA: Diagnosis not present

## 2021-05-26 DIAGNOSIS — K219 Gastro-esophageal reflux disease without esophagitis: Secondary | ICD-10-CM | POA: Diagnosis not present

## 2021-05-26 DIAGNOSIS — J301 Allergic rhinitis due to pollen: Secondary | ICD-10-CM | POA: Diagnosis not present

## 2021-05-26 MED ORDER — PREDNISONE 10 MG PO TABS: ORAL_TABLET | ORAL | 0 refills | Status: DC

## 2021-05-26 NOTE — Progress Notes (Signed)
Patient ID: Diana Blair, female     DOB: 16-Apr-1981, 41 y.o.      MRN: [id number]  No chief complaint on file.   Virtual Visit via Video Note  I connected with MALLEY HAUTER on 05/26/21 at 12:00 PM EST by a video enabled telemedicine application and verified that I am speaking with the correct person using two identifiers.  Location: Patient: Home Provider: Office   I discussed the limitations of evaluation and management by telemedicine and the availability of in person appointments. The patient expressed understanding and agreed to proceed.  History of Present Illness: 41 year old female, former smoker (9 pack years) followed for upper airway cough syndrome, suspected sarcoidosis, allergic rhinitis, GERD.  She is a patient of Dr. Agustina Caroli and was last seen in office on 08/20/2020.  Past medical history significant for hypertension, PCOS, obesity, sleep apnea (followed by neurology).  She is followed by rheumatology.  08/20/2020: OV with Dr. Lamonte Sakai.  GERD well controlled.  Upper airway cough syndrome improved since starting Zyrtec.  Added Flonase nasal spray.  Previous identified pulmonary infiltrates on chest x-ray suspected to be sarcoidosis.  Plans to repeat CT chest in October and decide need for bronchoscopy or therapy.  PFTs overall reassuring with some possible evidence of mild obstruction.  Continue PPI and Pepcid.  03/25/2021: OV with Dr. Benjamine Mola with rheumatology.  Previous CT scan with changes suspected to be sarcoid and high ACE level; never underwent biopsy.  Plan to observe for any symptom changes with no indication to start systemic immunosuppression at this time.  Plans to follow-up with OB/GYN for vaginal bleeding.  05/26/2021: Today - acute Patient presents today via virtual visit for worsening cough over the past few weeks. She noticed an increase in the beginning of January and restarted her PPI and pepcid, which usually helps her. She describes her cough as dry. Her cough  is worse at night and has caused difficulties with sleeping so she has felt a little more fatigued. She does have some shortness of breath with coughing spells. She is also experiencing vaginal bleeding with anemia which she is being followed by GYN for. She denies any wheezing, orthopnea, chest pain, lower extremity edema, or hemoptysis. She continues on Zyrtec daily and flonase and feels as though her post nasal drip is well-controlled.   No Known Allergies Immunization History  Administered Date(s) Administered   Influenza Split 05/24/2016   Tdap 05/24/2016   Past Medical History:  Diagnosis Date   Acid reflux    Anemia    B12 deficiency    Bilateral swelling of feet    Chronic hypertension in obstetric context in first trimester 05/04/2016   Diverticulitis    Heart palpitations    High cholesterol    Hypertension    Morbid obesity with BMI of 50.0-59.9, adult (HCC)    BMI 58.8   PCOS (polycystic ovarian syndrome)    Prediabetes    Sarcoidosis    Seizure ([place])    as a child   Sleep apnea    SOB (shortness of breath)    Vitamin D deficiency     Tobacco History: Social History   Tobacco Use  Smoking Status Former   Packs/day: 1.50   Years: 6.00   Pack years: 9.00   Types: Cigarettes   Quit date: 02/08/2009   Years since quitting: 12.3  Smokeless Tobacco Never  Tobacco Comments   Smoked off/on x 6 years   Counseling given: Not Answered Tobacco comments: Smoked  off/on x 6 years   Outpatient Medications Prior to Visit  Medication Sig Dispense Refill   Biotin w/ Vitamins C & E (HAIR/SKIN/NAILS) 1250-7.5-7.5 MCG-MG-UNT CHEW Chew by mouth.     cetirizine (ZYRTEC) 10 MG tablet Take 10 mg by mouth daily.     Cholecalciferol (D3-1000) 25 MCG (1000 UT) capsule Take 1,000 Units by mouth daily.     famotidine (PEPCID) 20 MG tablet Take 1 tablet (20 mg total) by mouth at bedtime. (Patient taking differently: Take 40 mg by mouth at bedtime.) 90 tablet 3   Ferrous Sulfate  Dried (HIGH POTENCY IRON) 65 MG TABS Take by mouth.     fluticasone (FLONASE) 50 MCG/ACT nasal spray Place 2 sprays into both nostrils daily. (Patient taking differently: Place 2 sprays into both nostrils as needed.) 16 g 11   methylcellulose (CITRUCEL) oral powder Take 1 packet by mouth daily.     pantoprazole (PROTONIX) 40 MG tablet TAKE 1 TABLET(40 MG) BY MOUTH DAILY 90 tablet 3   pyridOXINE (VITAMIN B-6) 100 MG tablet Take 100 mg by mouth daily.     Pyridoxine HCl (VITAMIN B6) 100 MG TABS      vitamin B-12 (CYANOCOBALAMIN) 1000 MCG tablet Take 1,000 mcg by mouth daily.     hydrochlorothiazide (HYDRODIURIL) 12.5 MG tablet      No facility-administered medications prior to visit.     Review of Systems:   Constitutional: No weight loss or gain, night sweats, fevers, chills. +fatigue HEENT: No headaches, difficulty swallowing, tooth/dental problems, or sore throat. No sneezing, itching, ear ache, nasal congestion, or post nasal drip CV:  No chest pain, orthopnea, PND, swelling in lower extremities, anasarca, dizziness, palpitations, syncope Resp: +shortness of breath with coughing spells; dry, hacking cough. No excess mucus or change in color of mucus. No hemoptysis. No wheezing.  No chest wall deformity GI:  No heartburn, indigestion, abdominal pain, nausea, vomiting, diarrhea, change in bowel habits, loss of appetite, bloody stools.  GU: No dysuria, change in color of urine, urgency or frequency.  No flank pain, no hematuria  Skin: No rash, lesions, ulcerations MSK:  No joint pain or swelling.  No decreased range of motion.  No back pain. Neuro: No dizziness or lightheadedness.  Psych: No depression or anxiety. Mood stable.   Observations/Objective: Patient is well-developed, in no acute distress. A&Ox3. Resting comfortably at home. Unlabored, regular breathing. Dry, hacking cough. Speech is clear and coherent with logical content.   TESTS/EVENTS:  12/13/2019 Echocardiogram: EF 60-65%.   Overall normal exam. 02/12/2020 PFTs: FVC 3.23 (101), FEV1 2.61 (99), ratio 82, TLC 88%, DLCO 93% 01/09/2020 CT super D chest: Parenchymal changes in the lungs, similar to previous.  No definite mediastinal or hilar lymphadenopathy.  Splenomegaly. 07/28/2020 HST: AHI 6.9/h and O2 nadir of 89%.  Recommended CPAP therapy.  Assessment and Plan: Upper airway cough syndrome Flare in symptoms. Increase PPI therapy to Twice daily. Prednisone taper. Supportive care. Preferred to use Delsym and OTC chlortab for cough suppression.   Patient Instructions  -Increase protonix to 40 mg Twice daily  -Continue pepcid 40 mg as needed for breakthrough GERD -Continue Zyrtec 10 mg daily -Continue flonase nasal spray 1-2 sprays each nostril daily  -Prednsione taper. 4 tabs for 2 days, then 3 tabs for 2 days, 2 tabs for 2 days, then 1 tab for 2 days, then stop. Take in AM with food -Chlortab 4 mg At bedtime for cough -Delsym 10 mL every 12 hours OTC for cough  Hard candies.  Avoid throat clearing.   CT scan scheduled  Follow up after CT scan with Dr. Lamonte Sakai. If symptoms do not improve or worsen, please contact office for sooner follow up or seek emergency care.     Pulmonary infiltrates on CXR Plan for repeat CT this past October but was never obtained. Will order CT chest w/o contrast for follow up to ensure cough is not related.  Allergic rhinitis Well-controlled on Zyrtec and flonase. Continue regimen.  Gastroesophageal reflux disease Increase PPI. Continue Pepcid for breakthrough GERD.     Follow Up Instructions: Follow up with Dr. Lamonte Sakai as scheduled on 2/16. If symptoms do not improve or worsen, please contact office for sooner follow up or seek emergency care.    I discussed the assessment and treatment plan with the patient. The patient was provided an opportunity to ask questions and all were answered. The patient agreed with the plan and demonstrated an understanding of the instructions.    The patient was advised to call back or seek an in-person evaluation if the symptoms worsen or if the condition fails to improve as anticipated.  I provided 32 minutes of non-face-to-face time during this encounter.   Clayton Bibles, NP

## 2021-05-26 NOTE — Assessment & Plan Note (Signed)
Plan for repeat CT this past October but was never obtained. Will order CT chest w/o contrast for follow up to ensure cough is not related.

## 2021-05-26 NOTE — Assessment & Plan Note (Signed)
Flare in symptoms. Increase PPI therapy to Twice daily. Prednisone taper. Supportive care. Preferred to use Delsym and OTC chlortab for cough suppression.   Patient Instructions  -Increase protonix to 40 mg Twice daily  -Continue pepcid 40 mg as needed for breakthrough GERD -Continue Zyrtec 10 mg daily -Continue flonase nasal spray 1-2 sprays each nostril daily  -Prednsione taper. 4 tabs for 2 days, then 3 tabs for 2 days, 2 tabs for 2 days, then 1 tab for 2 days, then stop. Take in AM with food -Chlortab 4 mg At bedtime for cough -Delsym 10 mL every 12 hours OTC for cough  Hard candies. Avoid throat clearing.   CT scan scheduled  Follow up after CT scan with Dr. Lamonte Sakai. If symptoms do not improve or worsen, please contact office for sooner follow up or seek emergency care.

## 2021-05-26 NOTE — Patient Instructions (Addendum)
-  Increase protonix to 40 mg Twice daily  -Continue pepcid 40 mg as needed for breakthrough GERD -Continue Zyrtec 10 mg daily -Continue flonase nasal spray 1-2 sprays each nostril daily  -Prednsione taper. 4 tabs for 2 days, then 3 tabs for 2 days, 2 tabs for 2 days, then 1 tab for 2 days, then stop. Take in AM with food -Chlortab 4 mg At bedtime for cough -Delsym 10 mL every 12 hours OTC for cough  Hard candies. Avoid throat clearing.   CT scan scheduled  Follow up after CT scan with Dr. Lamonte Sakai on 2/16. If symptoms do not improve or worsen, please contact office for sooner follow up or seek emergency care.

## 2021-05-26 NOTE — Assessment & Plan Note (Signed)
Increase PPI. Continue Pepcid for breakthrough GERD.

## 2021-05-26 NOTE — Telephone Encounter (Signed)
Pt on virtual visit at 1212. See encounter notes.

## 2021-05-26 NOTE — Assessment & Plan Note (Signed)
Well-controlled on Zyrtec and flonase. Continue regimen.

## 2021-05-27 ENCOUNTER — Ambulatory Visit (INDEPENDENT_AMBULATORY_CARE_PROVIDER_SITE_OTHER): Payer: Medicaid Other | Admitting: Family Medicine

## 2021-05-27 ENCOUNTER — Telehealth: Payer: Self-pay | Admitting: Nurse Practitioner

## 2021-05-27 ENCOUNTER — Encounter (INDEPENDENT_AMBULATORY_CARE_PROVIDER_SITE_OTHER): Payer: Self-pay | Admitting: Family Medicine

## 2021-05-27 VITALS — BP 129/70 | HR 97 | Temp 99.0°F | Ht 63.0 in | Wt 395.0 lb

## 2021-05-27 DIAGNOSIS — Z6841 Body Mass Index (BMI) 40.0 and over, adult: Secondary | ICD-10-CM

## 2021-05-27 DIAGNOSIS — E7849 Other hyperlipidemia: Secondary | ICD-10-CM | POA: Diagnosis not present

## 2021-05-27 DIAGNOSIS — D508 Other iron deficiency anemias: Secondary | ICD-10-CM | POA: Diagnosis not present

## 2021-05-27 DIAGNOSIS — R7303 Prediabetes: Secondary | ICD-10-CM | POA: Diagnosis not present

## 2021-05-27 DIAGNOSIS — R32 Unspecified urinary incontinence: Secondary | ICD-10-CM

## 2021-05-27 DIAGNOSIS — E559 Vitamin D deficiency, unspecified: Secondary | ICD-10-CM | POA: Diagnosis not present

## 2021-05-27 DIAGNOSIS — R159 Full incontinence of feces: Secondary | ICD-10-CM

## 2021-05-27 DIAGNOSIS — E669 Obesity, unspecified: Secondary | ICD-10-CM

## 2021-05-27 NOTE — Progress Notes (Signed)
Chief Complaint:   OBESITY Diana Blair is here to discuss her progress with her obesity treatment plan along with follow-up of her obesity related diagnoses. Diana Blair is on keeping a food journal and adhering to recommended goals of 2100 calories and 90 grams protein and the Diana Blair and states she is following her eating plan approximately 30% of the time. Diana Blair states she is not currently exercising.  Today's visit was #: 2 Starting weight: 394 lbs Starting date: 05/13/2021 Today's weight: 395 lbs  Today's date: 05/27/2021 Total lbs lost to date: 0 Total lbs lost since last in-office visit: 0  Interim History: Pt has not been feeling well over the last few weeks. Reflux has been getting worse and pt is also having flare of sarcoidosis. She was put on prednisone. She did download My[employer]itnessPal and started logging food. Pt recognizes she is often deficient in calories and nutrition.  Subjective:   1. Prediabetes Discussed labs with patient today. Pt's last A1c was 5.5 (done at different provider) with an insulin level of 54.1. She is not on meds.  2. Vitamin D deficiency New. Discussed labs with patient today. Vit D level is 21.6. Pt is on OTC Vit D 1000 IU daily.  3. Other iron deficiency anemia Discussed labs with patient today. CBC hemoglobin 9.6, hematocrit 30.9, MCV 71.5 (done at different location). Pt is on high potency iron. Ferritin level is low.  4. Other hyperlipidemia Discussed labs with patient today. Pt has an LDL of 120, HDL 40, and triglycerides 63. She is not on meds. Can't risk stratify because pt is below 69 yo.  5. Bowel and bladder incontinence Pt has seen urology twice in the past. She has upcoming procedure to test prolapse amount (urodynamics).  Assessment/Plan:   1. Prediabetes Diana Blair will continue to work on weight loss, exercise, and decreasing simple carbohydrates to help decrease the risk of diabetes. Pt is to work on Customer service manager. Repeat labs in 3 months.  2. Vitamin D deficiency Low Vitamin D level contributes to fatigue and are associated with obesity, breast, and colon cancer. She agrees to increase OTC Vitamin D to 5,000 IU QD and will follow-up for routine testing of Vitamin D, at least 2-3 times per year to avoid over-replacement. Pt defers Rx Vit D.  3. Other iron deficiency anemia Pt messaged PCP and will follow up with repeat lab draw. Continue high potency iron.  4. Other hyperlipidemia Cardiovascular risk and specific lipid/LDL goals reviewed.  We discussed several lifestyle modifications today and Diana Blair will continue to work on diet, exercise and weight loss efforts. Orders and follow up as documented in patient record. Follow up labs in 3-4 months.  Counseling Intensive lifestyle modifications are the first line treatment for this issue. Dietary changes: Increase soluble fiber. Decrease simple carbohydrates. Exercise changes: Moderate to vigorous-intensity aerobic activity 150 minutes per week if tolerated. Lipid-lowering medications: see documented in medical record.  5. Bowel and bladder incontinence F/u on sxs at next appt.  6. Obesity with current BMI of 70.1 Diana Blair is currently in the action stage of change. As such, her goal is to continue with weight loss efforts. She has agreed to keeping a food journal and adhering to recommended goals of 2100 calories and 120+ grams protein.   Exercise goals: All adults should avoid inactivity. Some physical activity is better than none, and adults who participate in any amount of physical activity gain some health benefits.  Behavioral modification strategies: increasing lean  protein intake, meal planning and cooking strategies, planning for success, and keeping a strict food journal.  Diana Blair has agreed to follow-up with our clinic in 2 weeks. She was informed of the importance of frequent follow-up visits to maximize her success with  intensive lifestyle modifications for her multiple health conditions.   Objective:   Blood pressure 129/70, pulse 97, temperature 99 F (37.2 C), height 5\' 3"  (1.6 m), weight (!) 395 lb (179.2 kg), last menstrual period 04/27/2021, SpO2 97 %. Body mass index is 69.97 kg/m.  General: Cooperative, alert, well developed, in no acute distress. HEENT: Conjunctivae and lids unremarkable. Cardiovascular: Regular rhythm.  Lungs: Normal work of breathing. Neurologic: No focal deficits.   Lab Results  Component Value Date   CREATININE 0.87 05/13/2021   BUN 9 05/13/2021   NA 138 05/13/2021   K 4.4 05/13/2021   CL 101 05/13/2021   CO2 21 05/13/2021   Lab Results  Component Value Date   ALT 29 05/13/2021   AST 30 05/13/2021   ALKPHOS 125 (H) 05/13/2021   BILITOT <0.2 05/13/2021   Lab Results  Component Value Date   HGBA1C 5.9 (H) 10/03/2019   HGBA1C 5.9 (H) 04/08/2014   HGBA1C 5.7 (H) 10/11/2012   HGBA1C 5.7 01/30/2009   Lab Results  Component Value Date   INSULIN 54.1 (H) 05/13/2021   Lab Results  Component Value Date   TSH 0.657 05/13/2021   Lab Results  Component Value Date   CHOL 172 05/13/2021   HDL 40 05/13/2021   LDLCALC 120 (H) 05/13/2021   TRIG 63 05/13/2021   Lab Results  Component Value Date   VD25OH 21.8 (L) 05/13/2021   Lab Results  Component Value Date   WBC 7.2 03/25/2021   HGB 9.0 (L) 03/25/2021   HCT 30.2 (L) 03/25/2021   MCV 81.2 03/25/2021   PLT 330 03/25/2021    Attestation Statements:   Reviewed by clinician on day of visit: allergies, medications, problem list, medical history, surgical history, family history, social history, and previous encounter notes.  Time spent on visit including pre-visit chart review and post-visit care and charting was 50 minutes.   Coral Ceo, CMA, am acting as transcriptionist for Coralie Common, MD.   I have reviewed the above documentation for accuracy and completeness, and I agree with the  above. - Coralie Common, MD

## 2021-05-28 ENCOUNTER — Ambulatory Visit: Payer: Medicaid Other | Attending: Obstetrics and Gynecology

## 2021-05-28 ENCOUNTER — Other Ambulatory Visit: Payer: Self-pay

## 2021-05-28 DIAGNOSIS — R293 Abnormal posture: Secondary | ICD-10-CM | POA: Insufficient documentation

## 2021-05-28 DIAGNOSIS — M6281 Muscle weakness (generalized): Secondary | ICD-10-CM | POA: Diagnosis not present

## 2021-05-28 DIAGNOSIS — N3281 Overactive bladder: Secondary | ICD-10-CM | POA: Diagnosis not present

## 2021-05-28 DIAGNOSIS — R279 Unspecified lack of coordination: Secondary | ICD-10-CM | POA: Insufficient documentation

## 2021-05-28 DIAGNOSIS — N816 Rectocele: Secondary | ICD-10-CM | POA: Insufficient documentation

## 2021-05-28 DIAGNOSIS — N8110 Cystocele, unspecified: Secondary | ICD-10-CM | POA: Insufficient documentation

## 2021-05-28 NOTE — Patient Instructions (Signed)
Bladder journal; 3 days.

## 2021-05-28 NOTE — Telephone Encounter (Signed)
2/1-I was able to get the pt resched for [facility] on 2/14 at 9:30. Spoke to pt and gave new info.

## 2021-05-28 NOTE — Therapy (Signed)
[facility] @ [facility] [place] [place], [place], [address] Phone: [phone]   Fax:  [phone]  Physical Therapy Evaluation  Patient Details  Name: Diana Blair MRN: [id number] Date of Birth: Nov 14, 1980 Referring Provider (PT): Jaquita Folds, MD   Encounter Date: 05/28/2021   PT End of Session - 05/28/21 1131     Visit Number 1    Date for PT Re-Evaluation 08/20/21    Authorization Type [employer] Medicaid    Authorization - Visit Number 8    PT Start Time [phone]    PT Stop Time 1016    PT Time Calculation (min) 38 min    Activity Tolerance Patient tolerated treatment well    Behavior During Therapy [facility] for tasks assessed/performed             Past Medical History:  Diagnosis Date   Acid reflux    Anemia    B12 deficiency    Bilateral swelling of feet    Chronic hypertension in obstetric context in first trimester 05/04/2016   Diverticulitis    Heart palpitations    High cholesterol    Hypertension    Morbid obesity with BMI of 50.0-59.9, adult (HCC)    BMI 58.8   PCOS (polycystic ovarian syndrome)    Prediabetes    Sarcoidosis    Seizure ([place])    as a child   Sleep apnea    SOB (shortness of breath)    Vitamin D deficiency     Past Surgical History:  Procedure Laterality Date   CERVICAL CERCLAGE  03/13/2012   Procedure: CERCLAGE CERVICAL;  Surgeon: Jonnie Kind, MD;  Location: [facility];  Service: Gynecology;  Laterality: N/A;   CERVICAL CERCLAGE  03/17/2012   Procedure: CERCLAGE CERVICAL;  Surgeon: Osborne Oman, MD;  Location: [facility];  Service: Gynecology;  Laterality: N/A;  Removal   COLONOSCOPY WITH PROPOFOL N/A 10/02/2020   Procedure: COLONOSCOPY WITH PROPOFOL;  Surgeon: Milus Banister, MD;  Location: [facility];  Service: Endoscopy;  Laterality: N/A;   ESOPHAGOGASTRODUODENOSCOPY (EGD) WITH PROPOFOL N/A 10/02/2020   Procedure: ESOPHAGOGASTRODUODENOSCOPY (EGD) WITH PROPOFOL;  Surgeon:  Milus Banister, MD;  Location: [facility];  Service: Endoscopy;  Laterality: N/A;   POLYPECTOMY  10/02/2020   Procedure: POLYPECTOMY;  Surgeon: Milus Banister, MD;  Location: [facility];  Service: Endoscopy;;    There were no vitals filed for this visit.    Subjective Assessment - 05/28/21 0939     Subjective Pt states that she is having urinary and bowel problems related to muscle weakness that started in 2019 and progressively gotten worse. She is having urge and stress incontinence. She states that sometimes she feels like she is leaking from an area that urine shouldn't come out of. She can also have difficulty getting to bathroom in time due to mobility issues and chronic pain. She will have vaginal pain if she has to try and hold her urine too long.    Pertinent History PCOS, prediabetic    Patient Stated Goals To get control over bladder and bowels.    Currently in Pain? No/denies    Multiple Pain Sites No                [facility] PT Assessment - 05/28/21 0001       Assessment   Medical Diagnosis N32.81 (ICD-10-CM) - Overactive bladder  N81.10 (ICD-10-CM) - Prolapse of anterior vaginal wall  N81.6 (ICD-10-CM) - Prolapse of  posterior vaginal wall    Referring Provider (PT) Jaquita Folds, MD    Next MD Visit 06/04/21    Prior Therapy yes      Precautions   Precautions None      Restrictions   Weight Bearing Restrictions No      Balance Screen   Has the patient fallen in the past 6 months No    Has the patient had a decrease in activity level because of a fear of falling?  No    Is the patient reluctant to leave their home because of a fear of falling?  No      Home Ecologist residence      Prior Function   Level of Independence Independent      Cognition   Overall Cognitive Status Within Functional Limits for tasks assessed      Functional Tests   Functional tests Sit to Stand      Sit to Stand   Comments Pt able to  perform without Bil UE support      Posture/Postural Control   Posture Comments Lt weight shift - unable to stand through Rt LE      ROM / Strength   AROM / PROM / Strength AROM;Strength      AROM   Overall AROM Comments Lumbar: flexion decrased 25%, extension decreased 75%, Bil rotation decreased 50%, Bil side bend reduced 25%      Strength   Overall Strength Comments Hip strength grossly 4/5      Ambulation/Gait   Gait Comments Ambulates with SPC in Rt UE for Rt LE support; antalgic gait pattern withreduced bil hip/knee flexion                        Objective measurements completed on examination: See above findings.     Pelvic Floor Special Questions - 05/28/21 0001     Prior Pelvic/Prostate Exam Yes    Are you Pregnant or attempting pregnancy? No    Prior Pregnancies Yes    Number of Pregnancies 2    Number of C-Sections 0    Number of Vaginal Deliveries 2   One living child   Any difficulty with labor and deliveries Yes   emergency circlage and infection   Episiotomy Performed No    Currently Sexually Active No   has had pain in the past; pain with initial penetration   History of sexually transmitted disease Yes    Urinary Leakage Yes    How often dialy    Pad use has to wear two; changes throughoutthe day    Activities that cause leaking Coughing;Sneezing;Laughing;Walking    Urinary urgency Yes    Urinary frequency every 2-3 hours    Fecal incontinence Yes   urgency; just occasional leakage; she will have to manually provide support   Fluid intake 80 oz a day    Caffeine beverages yes    Falling out feeling (prolapse) Yes    Activities that cause feeling of prolapse sititng on toilet                       PT Education - 05/28/21 1130     Education Details Pt education performed on pelvic helath PT, internal exam to be performed next treatment session, and treatment. She was instructed how to fill out bladder journal and purpose of  obtaining information.    Person(s) Educated Patient  Methods Explanation    Comprehension Verbalized understanding              PT Short Term Goals - 05/28/21 1138       PT SHORT TERM GOAL #1   Title Pt will be independent with HEP.    Time 4    Period Weeks    Status New    Target Date 06/25/21      PT SHORT TERM GOAL #2   Title Pt will be independent and provide teach back of urge suppression technique and the knack in order to help decrease urinary incontinence.    Time 4    Period Weeks    Status New    Target Date 06/25/21               PT Long Term Goals - 05/28/21 1138       PT LONG TERM GOAL #1   Title Pt will be independent with advanced HEP.    Time 4    Period Weeks    Status New    Target Date 08/20/21      PT LONG TERM GOAL #2   Title Pt will be able to correctly perform diaphragmatic breathing and appropriate pressure management in order to prevent worsening vaginal wall laxity and improve pelvic floor A/ROM.    Time 12    Period Weeks    Status New    Target Date 08/20/21      PT LONG TERM GOAL #3   Title Pt will report no leaks with laughing, coughing, sneezing in order to improve comfort with interpersonal relationships and community activities.    Time 12    Period Weeks    Status New    Target Date 08/20/21      PT LONG TERM GOAL #4   Title Pt will improve gait pattern and speed in order to be able to get to bathroom with increased ease, decreasing functional incontinence.    Time 12    Period Weeks    Status New    Target Date 08/20/21      PT LONG TERM GOAL #5   Title Pt will be able to go 2 hours in between voids without urgency or incontinence in order to improve QOL and perform all functional activities with less difficulty.    Time 12    Period Weeks    Status New    Target Date 08/20/21                    Plan - 05/28/21 1132     Clinical Impression Statement Pt is a 41 year old female with chief  complaint of urinary incontinence that started in 2019 and has progressively gotten worse. Exam findings limited today due to extensive subjective history and late arrive; we will plan to perform internal pelivc exam next treatment session. External exam findings include abnormal posture/gait pattern, hip weakness, and decreased lumbar A/ROM. Signs and symptoms based off subjective information make pelvic floor weakness, pressure management, and poor bladder habits likely cause of symptoms; we will continue working evaluation as more information is gathered. She will benefit from skilled PT intervention in order to address impairments, decrease urinary incontinence, and improve QOL.    Personal Factors and Comorbidities Comorbidity 1;Comorbidity 2;Comorbidity 3+    Comorbidities PCOS, prediabetic, sarcoidosis, chronic pain, obesity    Examination-Activity Limitations Continence    Examination-Participation Restrictions Community Activity;Interpersonal Relationship    Stability/Clinical Decision Making Stable/Uncomplicated  Clinical Decision Making Low    Rehab Potential Fair    PT Frequency 1x / week    PT Duration 12 weeks    PT Treatment/Interventions ADLs/Self Care Home Management;Biofeedback;Cryotherapy;Electrical Stimulation;Moist Heat;Therapeutic activities;Therapeutic exercise;Neuromuscular re-education;Manual techniques;Patient/family education;Scar mobilization;Passive range of motion;Dry needling;Spinal Manipulations    PT Next Visit Plan Perform internal pelvic floor exam; begin strengthening program for pelvic floor if appropriate; Femmeze for improved ease of bowel movements; double voiding; urge suppression technique.    PT Home Exercise Plan NA    Consulted and Agree with Plan of Care Patient             Patient will benefit from skilled therapeutic intervention in order to improve the following deficits and impairments:  Decreased coordination, Decreased range of motion,  Increased fascial restricitons, Impaired tone, Decreased endurance, Increased muscle spasms, Pain, Decreased activity tolerance, Hypomobility, Impaired flexibility, Improper body mechanics, Postural dysfunction, Decreased mobility, Decreased strength  Visit Diagnosis: Muscle weakness (generalized)  Abnormal posture  Unspecified lack of coordination     Problem List Patient Active Problem List   Diagnosis Date Noted   Urinary incontinence 04/02/2021   Abnormal uterine bleeding 03/25/2021   Generalized weakness 01/21/2021   Bilateral ankle pain 01/21/2021   Vitamin D deficiency 01/21/2021   Elevated CK 01/21/2021   Sleep apnea 12/09/2020   Allergic rhinitis 08/20/2020   Sarcoidosis 09/07/2019   Dysphagia 08/05/2019   Gastroesophageal reflux disease 03/20/2019   Pulmonary infiltrates on CXR 02/26/2019   Upper airway cough syndrome 02/26/2019   Advanced maternal age in multigravida 11/10/2016   Chronic hypertension in pregnancy 11/10/2016   IUGR (intrauterine growth restriction) affecting care of mother, third trimester, other fetus 11/05/2016   BMI 60.0-69.9, adult ([place]) 05/04/2016   History of preterm delivery 08/20/2015   History of cervical cerclage 08/20/2015   PCOS (polycystic ovarian syndrome) 02/08/2013    Heather Roberts, PT, DPT02/05/2309:41 AM   [facility] @ [facility] [place], [place], [address] Phone: [phone]   Fax:  [phone]  Name: Diana Blair MRN: [id number] Date of Birth: 1980/07/01

## 2021-05-29 NOTE — Telephone Encounter (Signed)
Patient scheduled to see Dr. Lamonte Sakai on 2/16.  Nothing further needed.

## 2021-06-03 ENCOUNTER — Other Ambulatory Visit: Payer: Medicaid Other

## 2021-06-09 ENCOUNTER — Ambulatory Visit (HOSPITAL_COMMUNITY)
Admission: RE | Admit: 2021-06-09 | Discharge: 2021-06-09 | Disposition: A | Discharge status: Home / Self Care | Payer: Medicaid Other | Source: Ambulatory Visit | Attending: Nurse Practitioner | Admitting: Nurse Practitioner

## 2021-06-09 ENCOUNTER — Other Ambulatory Visit: Payer: Self-pay

## 2021-06-09 DIAGNOSIS — R918 Other nonspecific abnormal finding of lung field: Secondary | ICD-10-CM | POA: Diagnosis present

## 2021-06-11 ENCOUNTER — Telehealth: Payer: Self-pay | Admitting: Emergency Medicine

## 2021-06-11 ENCOUNTER — Other Ambulatory Visit: Payer: Self-pay

## 2021-06-11 ENCOUNTER — Telehealth (INDEPENDENT_AMBULATORY_CARE_PROVIDER_SITE_OTHER): Payer: Medicaid Other | Admitting: Emergency Medicine

## 2021-06-11 ENCOUNTER — Encounter: Payer: Self-pay | Admitting: Emergency Medicine

## 2021-06-11 DIAGNOSIS — D869 Sarcoidosis, unspecified: Secondary | ICD-10-CM

## 2021-06-11 DIAGNOSIS — R058 Other specified cough: Secondary | ICD-10-CM

## 2021-06-11 DIAGNOSIS — G4733 Obstructive sleep apnea (adult) (pediatric): Secondary | ICD-10-CM | POA: Diagnosis not present

## 2021-06-11 DIAGNOSIS — K219 Gastro-esophageal reflux disease without esophagitis: Secondary | ICD-10-CM

## 2021-06-11 NOTE — Assessment & Plan Note (Signed)
Continue PPI bid, pepcid 40

## 2021-06-11 NOTE — Assessment & Plan Note (Signed)
She has investigated dental device, wants to hold off on CPAP for now

## 2021-06-11 NOTE — Assessment & Plan Note (Signed)
Manage GERD and rhinitis as aggressively as possible.

## 2021-06-11 NOTE — Assessment & Plan Note (Signed)
Presumed sarcoid, no biopsy. CT chest and resp status appear to be stable.  Refill albuterol to have available prn. No scheduled BD for now.

## 2021-06-11 NOTE — Telephone Encounter (Signed)
Spoke with pt and notified her that it was ok'ed by Dr. Lamonte Sakai to change OV to [facility] visit. Pt stated she did have access to phone with a camera. Appointment changed to MyChart Video visit. Nothing further needed at this time.

## 2021-06-11 NOTE — Progress Notes (Signed)
Virtual Visit via Video Note  I connected with Diana Blair on 06/11/21 at  4:30 PM EST by a video enabled telemedicine application and verified that I am speaking with the correct person using two identifiers.  Location: Patient: Home Provider: Office   I discussed the limitations of evaluation and management by telemedicine and the availability of in person appointments. The patient expressed understanding and agreed to proceed.  History of Present Illness: 41 year old woman with a history of pulmonary infiltrates on CT chest, elevated ACE level, possible mild obstructive lung disease, all suggestive of possible sarcoidosis.  She has a history of chronic rhinitis, GERD and chronic cough.   Observations/Objective: CT scan of the chest 06/09/2021 reviewed by me, shows architectural volume loss and distortion and peribronchial nodularity in the upper lobes, less prominent than prior.  She has similar findings in the right middle lobe left lower lobe and lingula again all improved.  A focal area of right lower lobe consolidation has resolved.  There are no new findings.  Home sleep test/4/22 with an AHI of 7/h, O2 nadir 89%, recommended CPAP. She was being considered for a dental device, but there were insurance barriers.   She has been experiencing increased cough and acute dyspnea.  She received prednisone 2 weeks ago for flaring cough.  Her PPI was increased to twice daily and she stayed on Pepcid.  Given Delsym and Chlortab in addition to her existing Zyrtec and Flonase prn. She has an old albuterol - never uses it.  She is following w [facility] neuro, ? 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 Elevated CK, abnormal IgA  Assessment and Plan:  Sarcoidosis Presumed sarcoid, no biopsy. CT chest and resp status appear to be stable.  Refill albuterol to have available prn. No scheduled BD for now.   Upper airway cough syndrome Manage GERD and rhinitis as aggressively as possible.   Gastroesophageal  reflux disease Continue PPI bid, pepcid 40  Sleep apnea She has investigated dental device, wants to hold off on CPAP for now   Follow Up Instructions: 6 months   I discussed the assessment and treatment plan with the patient. The patient was provided an opportunity to ask questions and all were answered. The patient agreed with the plan and demonstrated an understanding of the instructions.   The patient was advised to call back or seek an in-person evaluation if the symptoms worsen or if the condition fails to improve as anticipated.  Time spent 30 minutes.    Collene Gobble, MD

## 2021-06-12 NOTE — Progress Notes (Signed)
Please notify patient that her CT chest showed some parenchymal changes in the lungs, consistent with her sarcoid, that have improved when compared to previous exam. Sarcoid stable and there is no evidence of worsening on imaging. 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 Continue follow up as scheduled.

## 2021-06-15 ENCOUNTER — Encounter (INDEPENDENT_AMBULATORY_CARE_PROVIDER_SITE_OTHER): Payer: Self-pay | Admitting: Family Medicine

## 2021-06-15 ENCOUNTER — Ambulatory Visit (INDEPENDENT_AMBULATORY_CARE_PROVIDER_SITE_OTHER): Payer: Medicaid Other | Admitting: Family Medicine

## 2021-06-15 ENCOUNTER — Other Ambulatory Visit: Payer: Self-pay

## 2021-06-15 VITALS — BP 138/74 | HR 71 | Temp 98.5°F | Ht 63.0 in | Wt 396.0 lb

## 2021-06-15 DIAGNOSIS — E7849 Other hyperlipidemia: Secondary | ICD-10-CM

## 2021-06-15 DIAGNOSIS — Z6841 Body Mass Index (BMI) 40.0 and over, adult: Secondary | ICD-10-CM

## 2021-06-15 DIAGNOSIS — E559 Vitamin D deficiency, unspecified: Secondary | ICD-10-CM | POA: Diagnosis not present

## 2021-06-15 DIAGNOSIS — E669 Obesity, unspecified: Secondary | ICD-10-CM | POA: Diagnosis not present

## 2021-06-15 NOTE — Progress Notes (Signed)
Chief Complaint:   OBESITY Diana Blair is here to discuss her progress with her obesity treatment plan along with follow-up of her obesity related diagnoses. Diana Blair is on keeping a food journal and adhering to recommended goals of 2100 calories and 120+ grams protein and states she is following her eating plan approximately 0% of the time. Diana Blair states she is not currently exercising.  Today's visit was #: 3 Starting weight: 394 lbs Starting date: 05/13/2021 Today's weight: 396 lbs Today's date: 06/15/2021 Total lbs lost to date: 0 Total lbs lost since last in-office visit: 0  Interim History: Pt has been really struggling over the last few weeks. Pt's daughter has had some diarrhea and unfortunately has not been able to go to school. Daughter is present in appt today. Over the next few weeks, pt realizes she isn't a prepper and wants to be more mindful and controlled in how she adds to food she prepares. She recognizes she isn't eating as much as she previously thought. She is also working on eating more frequently. Pt is trying to introduce new foods into her diet.  Subjective:   1. Vitamin D deficiency Pt is not on Rx Vit D due to concerns over exacerbating sarcoidosis. Pt reports fatigue.  2. Other hyperlipidemia Deklynn has an LDL of 120, HDL 40, and triglycerides 63. She is not on statin.  Assessment/Plan:   1. Vitamin D deficiency Low Vitamin D level contributes to fatigue and are associated with obesity, breast, and colon cancer. She agrees to continue to take OTC Vitamin D 1,000 IU daily and will follow-up for routine testing of Vitamin D, at least 2-3 times per year to avoid over-replacement.  2. Other hyperlipidemia Cardiovascular risk and specific lipid/LDL goals reviewed.  We discussed several lifestyle modifications today and Diana Blair will continue to work on diet, exercise and weight loss efforts. Orders and follow up as documented in patient record. Continue  journaling.  Counseling Intensive lifestyle modifications are the first line treatment for this issue. Dietary changes: Increase soluble fiber. Decrease simple carbohydrates. Exercise changes: Moderate to vigorous-intensity aerobic activity 150 minutes per week if tolerated. Lipid-lowering medications: see documented in medical record.  3. Obesity with current BMI of 70.2 Diana Blair is currently in the action stage of change. As such, her goal is to continue with weight loss efforts. She has agreed to keeping a food journal and adhering to recommended goals of 2000-2200 calories and 145+ grams protein.   Exercise goals: All adults should avoid inactivity. Some physical activity is better than none, and adults who participate in any amount of physical activity gain some health benefits.  Behavioral modification strategies: increasing lean protein intake, meal planning and cooking strategies, keeping healthy foods in the home, and planning for success.  Diana Blair has agreed to follow-up with our clinic in 3 weeks. She was informed of the importance of frequent follow-up visits to maximize her success with intensive lifestyle modifications for her multiple health conditions.   Objective:   Blood pressure 138/74, pulse 71, temperature 98.5 F (36.9 C), height 5\' 3"  (1.6 m), weight (!) 396 lb (179.6 kg), last menstrual period 06/03/2021, SpO2 98 %. Body mass index is 70.15 kg/m.  General: Cooperative, alert, well developed, in no acute distress. HEENT: Conjunctivae and lids unremarkable. Cardiovascular: Regular rhythm.  Lungs: Normal work of breathing. Neurologic: No focal deficits.   Lab Results  Component Value Date   CREATININE 0.87 05/13/2021   BUN 9 05/13/2021   NA 138 05/13/2021  K 4.4 05/13/2021   CL 101 05/13/2021   CO2 21 05/13/2021   Lab Results  Component Value Date   ALT 29 05/13/2021   AST 30 05/13/2021   ALKPHOS 125 (H) 05/13/2021   BILITOT <0.2 05/13/2021   Lab  Results  Component Value Date   HGBA1C 5.9 (H) 10/03/2019   HGBA1C 5.9 (H) 04/08/2014   HGBA1C 5.7 (H) 10/11/2012   HGBA1C 5.7 01/30/2009   Lab Results  Component Value Date   INSULIN 54.1 (H) 05/13/2021   Lab Results  Component Value Date   TSH 0.657 05/13/2021   Lab Results  Component Value Date   CHOL 172 05/13/2021   HDL 40 05/13/2021   LDLCALC 120 (H) 05/13/2021   TRIG 63 05/13/2021   Lab Results  Component Value Date   VD25OH 21.8 (L) 05/13/2021   Lab Results  Component Value Date   WBC 7.2 03/25/2021   HGB 9.0 (L) 03/25/2021   HCT 30.2 (L) 03/25/2021   MCV 81.2 03/25/2021   PLT 330 03/25/2021    Attestation Statements:   Reviewed by clinician on day of visit: allergies, medications, problem list, medical history, surgical history, family history, social history, and previous encounter notes.  Coral Ceo, CMA, am acting as transcriptionist for Coralie Common, MD.  I have reviewed the above documentation for accuracy and completeness, and I agree with the above. - Coralie Common, MD

## 2021-06-16 ENCOUNTER — Ambulatory Visit: Payer: Medicaid Other

## 2021-06-16 DIAGNOSIS — R293 Abnormal posture: Secondary | ICD-10-CM

## 2021-06-16 DIAGNOSIS — N3281 Overactive bladder: Secondary | ICD-10-CM | POA: Diagnosis not present

## 2021-06-16 DIAGNOSIS — M6281 Muscle weakness (generalized): Secondary | ICD-10-CM

## 2021-06-16 DIAGNOSIS — R279 Unspecified lack of coordination: Secondary | ICD-10-CM

## 2021-06-16 NOTE — Therapy (Signed)
[facility] @ [place] [place], [place], [address] Phone: [phone]   Fax:  [phone]  Physical Therapy Treatment  Patient Details  Name: Diana Blair MRN: [id number] Date of Birth: 1980/12/04 Referring Provider (PT): Jaquita Folds, MD   Encounter Date: 06/16/2021   PT End of Session - 06/16/21 1238     Visit Number 2    Date for PT Re-Evaluation 08/20/21    Authorization Type UHC Medicaid    Authorization - Visit Number 70    PT Start Time 1237    PT Stop Time 6433    PT Time Calculation (min) 40 min    Activity Tolerance Patient tolerated treatment well    Behavior During Therapy [facility] for tasks assessed/performed             Past Medical History:  Diagnosis Date   Acid reflux    Anemia    B12 deficiency    Bilateral swelling of feet    Chronic hypertension in obstetric context in first trimester 05/04/2016   Diverticulitis    Heart palpitations    High cholesterol    Hypertension    Morbid obesity with BMI of 50.0-59.9, adult (HCC)    BMI 58.8   PCOS (polycystic ovarian syndrome)    Prediabetes    Sarcoidosis    Seizure ([place])    as a child   Sleep apnea    SOB (shortness of breath)    Vitamin D deficiency     Past Surgical History:  Procedure Laterality Date   CERVICAL CERCLAGE  03/13/2012   Procedure: CERCLAGE CERVICAL;  Surgeon: Jonnie Kind, MD;  Location: [facility];  Service: Gynecology;  Laterality: N/A;   CERVICAL CERCLAGE  03/17/2012   Procedure: CERCLAGE CERVICAL;  Surgeon: Osborne Oman, MD;  Location: [facility];  Service: Gynecology;  Laterality: N/A;  Removal   COLONOSCOPY WITH PROPOFOL N/A 10/02/2020   Procedure: COLONOSCOPY WITH PROPOFOL;  Surgeon: Milus Banister, MD;  Location: [facility];  Service: Endoscopy;  Laterality: N/A;   ESOPHAGOGASTRODUODENOSCOPY (EGD) WITH PROPOFOL N/A 10/02/2020   Procedure: ESOPHAGOGASTRODUODENOSCOPY (EGD) WITH PROPOFOL;  Surgeon:  Milus Banister, MD;  Location: [facility];  Service: Endoscopy;  Laterality: N/A;   POLYPECTOMY  10/02/2020   Procedure: POLYPECTOMY;  Surgeon: Milus Banister, MD;  Location: [facility];  Service: Endoscopy;;    There were no vitals filed for this visit.   Subjective Assessment - 06/16/21 1241     Subjective Pt reports no changes since first visit; she reports forgetting about bladder diary, but will attempt to fill out for next visit.    Patient Stated Goals To get control over bladder and bowels.    Currently in Pain? No/denies    Multiple Pain Sites No                     No emotional/communication barriers or cognitive limitation. Patient is motivated to learn. Patient understands and agrees with treatment goals and plan. PT explains patient will be examined in standing, sitting, and lying down to see how their muscles and joints work. When they are ready, they will be asked to remove their underwear so PT can examine their perineum. The patient is also given the option of providing their own chaperone as one is not provided in our facility. The patient also has the right and is explained the right to defer or refuse any part of the evaluation or  treatment including the internal exam. With the patient's consent, PT will use one gloved finger to gently assess the muscles of the pelvic floor, seeing how well it contracts and relaxes and if there is muscle symmetry. After, the patient will get dressed and PT and patient will discuss exam findings and plan of care. PT and patient discuss plan of care, schedule, attendance policy and HEP activities.        Pelvic Floor Special Questions - 06/16/21 0001     Skin Integrity Intact    Scar Tear    Perineal Body/Introitus  Normal    External Palpation WNL    Prolapse Anterior Wall;Posterior Wall   Grade 2; Grade 1   Pelvic Floor Internal Exam Pt identity confirmed and verbal consent for internal pelvic exam provided    Exam  Type Vaginal    Sensation WNL    Palpation WNL    Strength fair squeeze, definite lift    Strength # of reps 6    Strength # of seconds 4    Tone normal               [facility] Adult PT Treatment/Exercise - 06/16/21 0001       Self-Care   Self-Care Other Self-Care Comments   Pt education performed on urge suppression technique, initial pelvic floor strengthening program, pressure management, double-voiding, squatty potty, and relaxed toileting mechanics.     Neuro Re-ed    Neuro Re-ed Details  Pelvic floor contraction training with multimodal cues; quick flicks 11B, long holds 5 x 10 sec, urge suppression 3 x 5      Manual Therapy   Manual Therapy Internal Pelvic Floor    Internal Pelvic Floor Internal pelvic floor exam                     PT Education - 06/16/21 1318     Education Details Pt education performed on urge suppression technique, initial pelvic floor strengthening program, pressure management, double-voiding, squatty potty, and relaxed toileting mechanics.    Person(s) Educated Patient    Methods Explanation;Demonstration;Tactile cues;Verbal cues;Handout    Comprehension Verbalized understanding              PT Short Term Goals - 05/28/21 1138       PT SHORT TERM GOAL #1   Title Pt will be independent with HEP.    Time 4    Period Weeks    Status New    Target Date 06/25/21      PT SHORT TERM GOAL #2   Title Pt will be independent and provide teach back of urge suppression technique and the knack in order to help decrease urinary incontinence.    Time 4    Period Weeks    Status New    Target Date 06/25/21               PT Long Term Goals - 05/28/21 1138       PT LONG TERM GOAL #1   Title Pt will be independent with advanced HEP.    Time 4    Period Weeks    Status New    Target Date 08/20/21      PT LONG TERM GOAL #2   Title Pt will be able to correctly perform diaphragmatic breathing and appropriate pressure management  in order to prevent worsening vaginal wall laxity and improve pelvic floor A/ROM.    Time 12    Period Weeks  Status New    Target Date 08/20/21      PT LONG TERM GOAL #3   Title Pt will report no leaks with laughing, coughing, sneezing in order to improve comfort with interpersonal relationships and community activities.    Time 12    Period Weeks    Status New    Target Date 08/20/21      PT LONG TERM GOAL #4   Title Pt will improve gait pattern and speed in order to be able to get to bathroom with increased ease, decreasing functional incontinence.    Time 12    Period Weeks    Status New    Target Date 08/20/21      PT LONG TERM GOAL #5   Title Pt will be able to go 2 hours in between voids without urgency or incontinence in order to improve QOL and perform all functional activities with less difficulty.    Time 12    Period Weeks    Status New    Target Date 08/20/21                   Plan - 06/16/21 1302     Clinical Impression Statement Pelvic floor exam demonstrated weakness of 3/5, decreased endurance 4 seconds, decreased coordination/endurance with 6 quick flicks, and anterior/posteiror vaignal wall laxity. She did very well with pelvic floor contraction training and was able to perform quick flicks, long holds, and urge suppression flicks with good motor control. Pt education performed on urge suppression technique, initial pelvic floor strengthening program, pressure management, double-voiding, squatty potty, and relaxed toileting mechanics. Hand outs given on initial pelvic floor strengthening program. She will continue to benefit from skilled PT intervention in order to progress pelvic floor strengthening and decrease urinary/fecal incontinence.    PT Treatment/Interventions ADLs/Self Care Home Management;Biofeedback;Cryotherapy;Electrical Stimulation;Moist Heat;Therapeutic activities;Therapeutic exercise;Neuromuscular re-education;Manual  techniques;Patient/family education;Scar mobilization;Passive range of motion;Dry needling;Spinal Manipulations    PT Next Visit Plan Plan to progress pelvic floor strengthening to seated and standing positions; begin core/hip strengthening in seated positions (supine hurts her back). Review bladder diary and urge suppression technique.    PT Home Exercise Plan [id number]    Consulted and Agree with Plan of Care Patient             Patient will benefit from skilled therapeutic intervention in order to improve the following deficits and impairments:  Decreased coordination, Decreased range of motion, Increased fascial restricitons, Impaired tone, Decreased endurance, Increased muscle spasms, Pain, Decreased activity tolerance, Hypomobility, Impaired flexibility, Improper body mechanics, Postural dysfunction, Decreased mobility, Decreased strength  Visit Diagnosis: Muscle weakness (generalized)  Abnormal posture  Unspecified lack of coordination     Problem List Patient Active Problem List   Diagnosis Date Noted   Urinary incontinence 04/02/2021   Abnormal uterine bleeding 03/25/2021   Generalized weakness 01/21/2021   Bilateral ankle pain 01/21/2021   Vitamin D deficiency 01/21/2021   Elevated CK 01/21/2021   Sleep apnea 12/09/2020   Allergic rhinitis 08/20/2020   Sarcoidosis 09/07/2019   Dysphagia 08/05/2019   Gastroesophageal reflux disease 03/20/2019   Pulmonary infiltrates on CXR 02/26/2019   Upper airway cough syndrome 02/26/2019   Advanced maternal age in multigravida 11/10/2016   Chronic hypertension in pregnancy 11/10/2016   IUGR (intrauterine growth restriction) affecting care of mother, third trimester, other fetus 11/05/2016   BMI 60.0-69.9, adult ([place]) 05/04/2016   History of preterm delivery 08/20/2015   History of cervical cerclage 08/20/2015   PCOS (polycystic  ovarian syndrome) 02/08/2013    Heather Roberts, PT, DPT02/21/231:25 PM   [facility] @ [address] [place], [place], [address] Phone: [phone]   Fax:  [phone]  Name: SHAREL BEHNE MRN: [id number] Date of Birth: 28-Sep-1980

## 2021-06-16 NOTE — Patient Instructions (Addendum)
Urge suppression technique: A technique to help you hold urine until its an appropriate time to go, whether this is making it home or trying to reach a specific voiding time frame according to your schedule. It helps to send signals from the bladder to the brain that say you dont actually have to void urine right now. This most likely only give you several minutes of relief at first, but repeat as needed; the benefit will last longer as you use this technique more and get into better bladder habits. ?  The technique: o Perform 5 quick flicks (Kegels) rapidly, not worrying about fully relaxing in between each (only in this technique). o Then perform several deep belly breaths while focusing on relaxing the pelvic floor. o Go do something else to help distract yourself from the urge to urinate. o Repeat as needed.  Double-voiding:  This technique is to help with post-void dribbling, or leaking a little bit when you stand up right after urinating.  Use relaxed toileting mechanics to urinate as much as you feel like you have to without straining.  Sit back upright from leaning forward and relax this way for 10-20 seconds.  Lean forward again to finish voiding any amount more.  Squatty potty: When your knees are level or below the level of your hips, pelvic floor muscles are pressed against rectum, preventing ease of bowel movement. By getting knees above the level of the hips, these pelvic floor muscles relax, allowing easier passage of bowel movement. ? Ways to get knees above hips: o Squatty Potty (7inch and 9inch versions) o Small stool o Roll of toilet paper under each foot o Hardback book or stack of magazines under each foot  Relaxed Toileting mechanics: Once in this position, make sure to lean forward with forearms on thighs, wide knees, relaxed stomach, and breathe.  Access Code: [facility] URL: [email or web address] Date: 06/16/2021 Prepared by:  Heather Roberts  Exercises Quick Flick Pelvic Floor Contractions in [place] - 3 x daily - 7 x weekly - 2 sets - 10 reps - 1 hold Supine Pelvic Floor Contraction - 3 x daily - 7 x weekly - 1 sets - 10 reps - 10 hold

## 2021-06-18 ENCOUNTER — Other Ambulatory Visit: Payer: Medicaid Other

## 2021-06-19 ENCOUNTER — Other Ambulatory Visit: Payer: Medicaid Other

## 2021-06-23 ENCOUNTER — Ambulatory Visit (INDEPENDENT_AMBULATORY_CARE_PROVIDER_SITE_OTHER): Payer: Medicaid Other | Admitting: Obstetrics and Gynecology

## 2021-06-23 ENCOUNTER — Other Ambulatory Visit: Payer: Self-pay

## 2021-06-23 VITALS — BP 92/61 | HR 88

## 2021-06-23 DIAGNOSIS — R350 Frequency of micturition: Secondary | ICD-10-CM | POA: Diagnosis not present

## 2021-06-23 DIAGNOSIS — N393 Stress incontinence (female) (male): Secondary | ICD-10-CM

## 2021-06-23 LAB — POCT URINALYSIS DIPSTICK
Appearance: NORMAL
Bilirubin, UA: NEGATIVE
Glucose, UA: NEGATIVE
Ketones, UA: NEGATIVE
Leukocytes, UA: NEGATIVE
Nitrite, UA: NEGATIVE
Protein, UA: NEGATIVE
Spec Grav, UA: 1.025 (ref 1.010–1.025)
Urobilinogen, UA: 0.2 E.U./dL
pH, UA: 7 (ref 5.0–8.0)

## 2021-06-23 NOTE — Patient Instructions (Signed)

## 2021-06-24 ENCOUNTER — Ambulatory Visit: Payer: Medicaid Other | Attending: Obstetrics and Gynecology

## 2021-06-24 DIAGNOSIS — R293 Abnormal posture: Secondary | ICD-10-CM | POA: Diagnosis present

## 2021-06-24 DIAGNOSIS — R279 Unspecified lack of coordination: Secondary | ICD-10-CM | POA: Insufficient documentation

## 2021-06-24 DIAGNOSIS — M6281 Muscle weakness (generalized): Secondary | ICD-10-CM | POA: Diagnosis present

## 2021-06-24 NOTE — Therapy (Signed)
[facility] @ [place], [place], [address] Phone: [phone]   Fax:  [phone]  Physical Therapy Treatment  Patient Details  Name: Diana Blair MRN: [id number] Date of Birth: 1981-02-01 Referring Provider (PT): Jaquita Folds, MD   Encounter Date: 06/24/2021   PT End of Session - 06/24/21 1155     Visit Number 3    Date for PT Re-Evaluation 08/20/21    Authorization Type UHC Medicaid    Authorization - Visit Number 74    PT Start Time 8413    PT Stop Time 1228    PT Time Calculation (min) 35 min    Activity Tolerance Patient tolerated treatment well    Behavior During Therapy [facility] for tasks assessed/performed             Past Medical History:  Diagnosis Date   Acid reflux    Anemia    B12 deficiency    Bilateral swelling of feet    Chronic hypertension in obstetric context in first trimester 05/04/2016   Diverticulitis    Heart palpitations    High cholesterol    Hypertension    Morbid obesity with BMI of 50.0-59.9, adult (HCC)    BMI 58.8   PCOS (polycystic ovarian syndrome)    Prediabetes    Sarcoidosis    Seizure ([place])    as a child   Sleep apnea    SOB (shortness of breath)    Vitamin D deficiency     Past Surgical History:  Procedure Laterality Date   CERVICAL CERCLAGE  03/13/2012   Procedure: CERCLAGE CERVICAL;  Surgeon: Jonnie Kind, MD;  Location: [facility];  Service: Gynecology;  Laterality: N/A;   CERVICAL CERCLAGE  03/17/2012   Procedure: CERCLAGE CERVICAL;  Surgeon: Osborne Oman, MD;  Location: [facility];  Service: Gynecology;  Laterality: N/A;  Removal   COLONOSCOPY WITH PROPOFOL N/A 10/02/2020   Procedure: COLONOSCOPY WITH PROPOFOL;  Surgeon: Milus Banister, MD;  Location: [facility];  Service: Endoscopy;  Laterality: N/A;   ESOPHAGOGASTRODUODENOSCOPY (EGD) WITH PROPOFOL N/A 10/02/2020   Procedure: ESOPHAGOGASTRODUODENOSCOPY (EGD) WITH PROPOFOL;  Surgeon: Milus Banister, MD;  [facility];  Service: Endoscopy;  Laterality: N/A;   POLYPECTOMY  10/02/2020   Procedure: POLYPECTOMY;  Surgeon: Milus Banister, MD;  [facility];  Service: Endoscopy;;    There were no vitals filed for this visit.   Subjective Assessment - 06/24/21 1156     Subjective Pt states that she did fill out a day of the bladder journal, but left it at home. She states that she tried double voiding and states that it was ok. She tried the urge suppression technique and states that she did it once at night and peed all over herself. She feels like it pushed the urine out. She has noticed that she feels increased abdominal pressure when she has to urinate, and breathing/relaxation is helpful.    Patient Stated Goals To get control over bladder and bowels.    Currently in Pain? No/denies    Multiple Pain Sites No                               [facility] Adult PT Treatment/Exercise - 06/24/21 0001       Self-Care   Self-Care Other Self-Care Comments   review of urge suppression technique, HEP, pelvic floor relaxation  Exercises   Exercises Lumbar      Lumbar Exercises: Seated   Other Seated Lumbar Exercises Seated hip adduction/abduction isometric 10 x 5 sec; supine march 2 x 10; horizontal abduction with core facilitatoin training in seated and red band 2 x 10                     PT Education - 06/24/21 1210     Education Details Pt education performed further on urge suppression technique, pelvic floor strengthening vs abdominal contraction.    Person(s) Educated Patient    Methods Explanation;Demonstration;Tactile cues;Verbal cues;Handout    Comprehension Verbalized understanding              PT Short Term Goals - 05/28/21 1138       PT SHORT TERM GOAL #1   Title Pt will be independent with HEP.    Time 4    Period Weeks    Status New    Target Date 06/25/21      PT SHORT TERM GOAL #2   Title Pt will be  independent and provide teach back of urge suppression technique and the knack in order to help decrease urinary incontinence.    Time 4    Period Weeks    Status New    Target Date 06/25/21               PT Long Term Goals - 05/28/21 1138       PT LONG TERM GOAL #1   Title Pt will be independent with advanced HEP.    Time 4    Period Weeks    Status New    Target Date 08/20/21      PT LONG TERM GOAL #2   Title Pt will be able to correctly perform diaphragmatic breathing and appropriate pressure management in order to prevent worsening vaginal wall laxity and improve pelvic floor A/ROM.    Time 12    Period Weeks    Status New    Target Date 08/20/21      PT LONG TERM GOAL #3   Title Pt will report no leaks with laughing, coughing, sneezing in order to improve comfort with interpersonal relationships and community activities.    Time 12    Period Weeks    Status New    Target Date 08/20/21      PT LONG TERM GOAL #4   Title Pt will improve gait pattern and speed in order to be able to get to bathroom with increased ease, decreasing functional incontinence.    Time 12    Period Weeks    Status New    Target Date 08/20/21      PT LONG TERM GOAL #5   Title Pt will be able to go 2 hours in between voids without urgency or incontinence in order to improve QOL and perform all functional activities with less difficulty.    Time 12    Period Weeks    Status New    Target Date 08/20/21                   Plan - 06/24/21 1213     Clinical Impression Statement Much of today's treatment session spent reviewing previous techniques and pelvic floor contractions for improved performance. Believe diffiuclty with urg esuppression technique is most likely coming from baring down when she is feeling significant urge to urinate. She will continue to benefit from skilled PT intervention in order  to progress pelvic floor strengthening and decrease urinary/fecal incontinence.  She did well with gentle/seated exercises to help facilitate pelvic floor contraction/strengthening. She will continue to benefit from skilled PT intervention in order to progress pelvic floor strengthening and decrease urinary/fecal incontinence.    PT Treatment/Interventions ADLs/Self Care Home Management;Biofeedback;Cryotherapy;Electrical Stimulation;Moist Heat;Therapeutic activities;Therapeutic exercise;Neuromuscular re-education;Manual techniques;Patient/family education;Scar mobilization;Passive range of motion;Dry needling;Spinal Manipulations    PT Next Visit Plan Plan to progress pelvic floor strengthening to seated and standing positions; begin core/hip strengthening in seated positions (supine hurts her back). Review bladder diary and urge suppression technique.    PT Home Exercise Plan [id number]    Consulted and Agree with Plan of Care Patient             Patient will benefit from skilled therapeutic intervention in order to improve the following deficits and impairments:  Decreased coordination, Decreased range of motion, Increased fascial restricitons, Impaired tone, Decreased endurance, Increased muscle spasms, Pain, Decreased activity tolerance, Hypomobility, Impaired flexibility, Improper body mechanics, Postural dysfunction, Decreased mobility, Decreased strength  Visit Diagnosis: Muscle weakness (generalized)  Abnormal posture  Unspecified lack of coordination     Problem List Patient Active Problem List   Diagnosis Date Noted   Urinary incontinence 04/02/2021   Abnormal uterine bleeding 03/25/2021   Generalized weakness 01/21/2021   Bilateral ankle pain 01/21/2021   Vitamin D deficiency 01/21/2021   Elevated CK 01/21/2021   Sleep apnea 12/09/2020   Allergic rhinitis 08/20/2020   Sarcoidosis 09/07/2019   Dysphagia 08/05/2019   Gastroesophageal reflux disease 03/20/2019   Pulmonary infiltrates on CXR 02/26/2019   Upper airway cough syndrome 02/26/2019    Advanced maternal age in multigravida 11/10/2016   Chronic hypertension in pregnancy 11/10/2016   IUGR (intrauterine growth restriction) affecting care of mother, third trimester, other fetus 11/05/2016   BMI 60.0-69.9, adult ([place]) 05/04/2016   History of preterm delivery 08/20/2015   History of cervical cerclage 08/20/2015   PCOS (polycystic ovarian syndrome) 02/08/2013    Heather Roberts, PT, DPT03/04/2310:30 PM   [facility] @ [place], [place], [address] Phone: [phone]   Fax:  [phone]  Name: Diana Blair MRN: [id number] Date of Birth: March 14, 1981

## 2021-06-25 NOTE — Progress Notes (Signed)
[facility] Urogynecology Urodynamics Procedure  Referring Physician: Berkley Harvey, NP Date of Procedure: 06/23/2021  Diana Blair is a 41 y.o. female who presents for urodynamic evaluation. Indication(s) for study: mixed incontinence  Vital Signs: BP 92/61    Pulse 88    LMP 06/03/2021   Laboratory Results: A catheterized urine specimen revealed:  POC urine: moderate blood, otherwise negative   Voiding Diary: Not performed  Procedure Timeout:  The correct patient was verified and the correct procedure was verified. The patient was in the correct position and safety precautions were reviewed based on at the patient's history.  Urodynamic Procedure A 392F dual lumen urodynamics catheter was placed under sterile conditions into the patient's bladder. A 392F catheter was placed into the rectum in order to measure abdominal pressure. EMG patches were placed in the appropriate position.  All connections were confirmed and calibrations/adjusted made. Saline was instilled into the bladder through the dual lumen catheters.  Cough/valsalva pressures were measured periodically during filling.  Patient was allowed to void.  The bladder was then emptied of its residual.  UROFLOW: Revealed a Qmax of 24 mL/sec.  She voided 47 mL and had a residual of 10 mL.  It was a normal pattern and represented normal habits though interpretation limited due to low voided volume.  CMG: This was performed with sterile water in the sitting position at a fill rate of 30 mL/min.    First sensation of fullness was 20 mLs,  First urge was 64 mLs,  Strong urge was 128 mLs and  Capacity was 473 mLs  Stress incontinence was demonstrated Lowest positive Barrier CLPP was 99 cmH20 at 473 ml in the standing position. Highest negative Barrier VLPP was 89 cmH20 at 473 ml.  Detrusor function was normal, with no phasic contractions seen.   Compliance:  normal. End fill detrusor pressure was 3.1cmH20.  Calculated  compliance was 92.49mL/cmH20  UPP: MUCP with barrier reduction was 146.8 cm of water.    MICTURITION STUDY: Voiding was performed with reduction using scopettes in the sitting position.  Pdet at Qmax was 61.3 cm of water.  Qmax was 39.1 mL/sec.  It was a normal pattern.  She voided 522.5 mL and had a residual of 5 mL.  It was a volitional void, sustained detrusor contraction was present and abdominal straining was not present  EMG: This was performed with patches.  She had voluntary contractions, recruitment with fill was present and urethral sphincter was relaxed with void.  The details of the procedure with the study tracings have been scanned into EPIC.   Urodynamic Impression:  1. Sensation was normal; capacity was normal 2. Stress Incontinence was demonstrated at normal pressures; 3. Detrusor Overactivity was not demonstrated. 4. Emptying was normal with a normal PVR, a sustained detrusor contraction present,  abdominal straining not present, normal urethral sphincter activity on EMG.  Plan: - The patient will follow up  to discuss the findings and treatment options.

## 2021-06-29 ENCOUNTER — Ambulatory Visit: Payer: Medicaid Other

## 2021-06-29 ENCOUNTER — Other Ambulatory Visit: Payer: Self-pay

## 2021-06-29 DIAGNOSIS — R293 Abnormal posture: Secondary | ICD-10-CM

## 2021-06-29 DIAGNOSIS — M6281 Muscle weakness (generalized): Secondary | ICD-10-CM | POA: Diagnosis not present

## 2021-06-29 DIAGNOSIS — R279 Unspecified lack of coordination: Secondary | ICD-10-CM

## 2021-06-29 NOTE — Therapy (Signed)
[facility] @ [address] [place], [place], [address] Phone: [phone]   Fax:  [phone]  Physical Therapy Treatment  Patient Details  Name: Diana Blair MRN: [id number] Date of Birth: February 23, 1981 Referring Provider (PT): Jaquita Folds, MD   Encounter Date: 06/29/2021   PT End of Session - 06/29/21 1152     Visit Number 4    Date for PT Re-Evaluation 08/20/21    Authorization Type [employer] Medicaid    Authorization - Visit Number 43    PT Start Time 5102    PT Stop Time 1226    PT Time Calculation (min) 35 min    Activity Tolerance Patient tolerated treatment well    Behavior During Therapy [facility] for tasks assessed/performed             Past Medical History:  Diagnosis Date   Acid reflux    Anemia    B12 deficiency    Bilateral swelling of feet    Chronic hypertension in obstetric context in first trimester 05/04/2016   Diverticulitis    Heart palpitations    High cholesterol    Hypertension    Morbid obesity with BMI of 50.0-59.9, adult (HCC)    BMI 58.8   PCOS (polycystic ovarian syndrome)    Prediabetes    Sarcoidosis    Seizure ([place])    as a child   Sleep apnea    SOB (shortness of breath)    Vitamin D deficiency     Past Surgical History:  Procedure Laterality Date   CERVICAL CERCLAGE  03/13/2012   Procedure: CERCLAGE CERVICAL;  Surgeon: Jonnie Kind, MD;  Location: [facility];  Service: Gynecology;  Laterality: N/A;   CERVICAL CERCLAGE  03/17/2012   Procedure: CERCLAGE CERVICAL;  Surgeon: Osborne Oman, MD;  Location: [facility];  Service: Gynecology;  Laterality: N/A;  Removal   COLONOSCOPY WITH PROPOFOL N/A 10/02/2020   Procedure: COLONOSCOPY WITH PROPOFOL;  Surgeon: Milus Banister, MD;  Location: [facility];  Service: Endoscopy;  Laterality: N/A;   ESOPHAGOGASTRODUODENOSCOPY (EGD) WITH PROPOFOL N/A 10/02/2020   Procedure: ESOPHAGOGASTRODUODENOSCOPY (EGD) WITH PROPOFOL;  Surgeon: Milus Banister, MD;  Location: [facility];  Service: Endoscopy;  Laterality: N/A;   POLYPECTOMY  10/02/2020   Procedure: POLYPECTOMY;  Surgeon: Milus Banister, MD;  Location: [facility];  Service: Endoscopy;;    There were no vitals filed for this visit.   Subjective Assessment - 06/29/21 1153     Subjective Pt states that she has not noticed any improvement in urgency or leaking when she performs sit>stand.    Patient Stated Goals To get control over bladder and bowels.    Currently in Pain? No/denies    Multiple Pain Sites No                               [facility] Adult PT Treatment/Exercise - 06/29/21 0001       Neuro Re-ed    Neuro Re-ed Details  Pelvic floor contractions: seated 10x and various standing poistions 10x each (normal, wide, staggered stance)      Lumbar Exercises: Seated   Sit to Stand 10 reps   with pelvic floor contraction                    PT Education - 06/29/21 1230     Education Details Pt educaiton performed on HEP progressions  of pelvic floor strengthening with sit<>stand/stnading positions and how to incorporate abdominal activation at the same time for added strength.    Person(s) Educated Patient    Methods Explanation;Demonstration;Tactile cues;Verbal cues;Handout    Comprehension Verbalized understanding              PT Short Term Goals - 05/28/21 1138       PT SHORT TERM GOAL #1   Title Pt will be independent with HEP.    Time 4    Period Weeks    Status New    Target Date 06/25/21      PT SHORT TERM GOAL #2   Title Pt will be independent and provide teach back of urge suppression technique and the knack in order to help decrease urinary incontinence.    Time 4    Period Weeks    Status New    Target Date 06/25/21               PT Long Term Goals - 05/28/21 1138       PT LONG TERM GOAL #1   Title Pt will be independent with advanced HEP.    Time 4    Period Weeks    Status New    Target  Date 08/20/21      PT LONG TERM GOAL #2   Title Pt will be able to correctly perform diaphragmatic breathing and appropriate pressure management in order to prevent worsening vaginal wall laxity and improve pelvic floor A/ROM.    Time 12    Period Weeks    Status New    Target Date 08/20/21      PT LONG TERM GOAL #3   Title Pt will report no leaks with laughing, coughing, sneezing in order to improve comfort with interpersonal relationships and community activities.    Time 12    Period Weeks    Status New    Target Date 08/20/21      PT LONG TERM GOAL #4   Title Pt will improve gait pattern and speed in order to be able to get to bathroom with increased ease, decreasing functional incontinence.    Time 12    Period Weeks    Status New    Target Date 08/20/21      PT LONG TERM GOAL #5   Title Pt will be able to go 2 hours in between voids without urgency or incontinence in order to improve QOL and perform all functional activities with less difficulty.    Time 12    Period Weeks    Status New    Target Date 08/20/21                   Plan - 06/29/21 1200     Clinical Impression Statement Pt is not seeing any progress yet while working on HEP and urge suppression technique. Believe some of this is due to weakness that will take time to correct; however, we did review urge suppression technique and how to utilize to give her the biggest benefit. She wanted to review diet and diffiuclty getting enough calories, but she was referred back to nutrition counseling for this information. She had increased diffiuclty with pelvic floor contractions in seated/standing positions, but reported that she could feel a good contraction; she was encouraged to progress to seated and vairous stnading positions at home, leaning over counter in standing if necessary. She did well with practicing pelvic floor contracitons with breath coordination in  sit<>stand, but pelvic floor was beginning to  get fatigued at this point. She will continue to benefit from skilled PT intervention in order to progress pelvic floor strengthening and decrease urinary/fecal incontinence.    PT Treatment/Interventions ADLs/Self Care Home Management;Biofeedback;Cryotherapy;Electrical Stimulation;Moist Heat;Therapeutic activities;Therapeutic exercise;Neuromuscular re-education;Manual techniques;Patient/family education;Scar mobilization;Passive range of motion;Dry needling;Spinal Manipulations    PT Next Visit Plan Progress pelvic floor strengthening to functional movements; progress core/hip strengthening.    PT Home Exercise Plan [id number]    Consulted and Agree with Plan of Care Patient             Patient will benefit from skilled therapeutic intervention in order to improve the following deficits and impairments:  Decreased coordination, Decreased range of motion, Increased fascial restricitons, Impaired tone, Decreased endurance, Increased muscle spasms, Pain, Decreased activity tolerance, Hypomobility, Impaired flexibility, Improper body mechanics, Postural dysfunction, Decreased mobility, Decreased strength  Visit Diagnosis: Muscle weakness (generalized)  Abnormal posture  Unspecified lack of coordination     Problem List Patient Active Problem List   Diagnosis Date Noted   Urinary incontinence 04/02/2021   Abnormal uterine bleeding 03/25/2021   Generalized weakness 01/21/2021   Bilateral ankle pain 01/21/2021   Vitamin D deficiency 01/21/2021   Elevated CK 01/21/2021   Sleep apnea 12/09/2020   Allergic rhinitis 08/20/2020   Sarcoidosis 09/07/2019   Dysphagia 08/05/2019   Gastroesophageal reflux disease 03/20/2019   Pulmonary infiltrates on CXR 02/26/2019   Upper airway cough syndrome 02/26/2019   Advanced maternal age in multigravida 11/10/2016   Chronic hypertension in pregnancy 11/10/2016   IUGR (intrauterine growth restriction) affecting care of mother, third trimester, other  fetus 11/05/2016   BMI 60.0-69.9, adult ([place]) 05/04/2016   History of preterm delivery 08/20/2015   History of cervical cerclage 08/20/2015   PCOS (polycystic ovarian syndrome) 02/08/2013    Heather Roberts, PT, DPT03/09/2310:31 PM   [facility] @ [address] [place], [place], [address] Phone: [phone]   Fax:  [phone]  Name: GEARLENE GODSIL MRN: [id number] Date of Birth: [birth year]

## 2021-07-01 ENCOUNTER — Other Ambulatory Visit: Payer: Self-pay

## 2021-07-01 ENCOUNTER — Telehealth (INDEPENDENT_AMBULATORY_CARE_PROVIDER_SITE_OTHER): Payer: Medicaid Other | Admitting: Obstetrics and Gynecology

## 2021-07-01 DIAGNOSIS — N3281 Overactive bladder: Secondary | ICD-10-CM

## 2021-07-01 DIAGNOSIS — R310 Gross hematuria: Secondary | ICD-10-CM

## 2021-07-01 DIAGNOSIS — N393 Stress incontinence (female) (male): Secondary | ICD-10-CM | POA: Diagnosis not present

## 2021-07-01 NOTE — Progress Notes (Signed)
[facility] ?Return Visit- video visit ? ?The patient was at home in [facility] and I was present in my office for the video visit. She was the only person on the call.  ? ?SUBJECTIVE  ?History of Present Illness: ?Diana Blair is a 41 y.o. female seen in follow-up for incontinence. She underwent urodynamic testing ?She feels that she only leaks with stress with full bladder in the standing position which was demonstrated on UDS. Does notice some urgency and urge incontinence. Has urine pooling regularly.  ? ?She is working with pelvic physical therapy but has not yet had improvement in her symptoms.  ? ?She has noticed blood in the urine- bright red. Not vaginal bleeding. Also has some anemia. Currently seeing hematology to get workup for possible bleeding disorder. She was also sent to rheumatology for possible autoimmune condition (does have sarcoidosis).  ? ?Urodynamic Impression:  ?1. Sensation was normal; capacity was normal ?2. Stress Incontinence was demonstrated at normal pressures; ?3. Detrusor Overactivity was not demonstrated. ?4. Emptying was normal with a normal PVR, a sustained detrusor contraction present,  abdominal straining not present, normal urethral sphincter activity on EMG. ? ?Past Medical History: ?Patient  has a past medical history of Acid reflux, Anemia, B12 deficiency, Bilateral swelling of feet, Chronic hypertension in obstetric context in first trimester (05/04/2016), Diverticulitis, Heart palpitations, High cholesterol, Hypertension, Morbid obesity with BMI of 50.0-59.9, adult ([place]), PCOS (polycystic ovarian syndrome), Prediabetes, Sarcoidosis, Seizure ([place]), Sleep apnea, SOB (shortness of breath), and Vitamin D deficiency.  ? ?Past Surgical History: ?She  has a past surgical history that includes Cervical cerclage (03/13/2012); Cervical cerclage (03/17/2012); Colonoscopy with propofol (N/A, 10/02/2020); Esophagogastroduodenoscopy (egd) with propofol (N/A, 10/02/2020); and  polypectomy (10/02/2020).  ? ?Medications: ?She has a current medication list which includes the following prescription(s): hair/skin/nails, cetirizine, d3-1000, famotidine, high potency iron, fluticasone, citrucel, pantoprazole, pyridoxine, vitamin b-12, and [DISCONTINUED] albuterol.  ? ?Allergies: ?Patient has No Known Allergies.  ? ?Social History: ?Patient  reports that she quit smoking about 12 years ago. Her smoking use included cigarettes. She has a 9.00 pack-year smoking history. She has never used smokeless tobacco. She reports that she does not drink alcohol and does not use drugs.  ?  ?  ?OBJECTIVE  ?  ? ?Physical Exam: ?There were no vitals filed for this visit. ?Gen: No apparent distress, A&O x 3. ? ?Detailed Urogynecologic Evaluation:  ?Deferred. Prior exam showed: ? ?No flowsheet data found.    ? ?ASSESSMENT AND PLAN  ?  ?[name] is a 41 y.o. with:  ?1. Macroscopic hematuria   ?2. SUI (stress urinary incontinence, female)   ?3. Overactive bladder   ? ?Hematuria ?- For management of gross hematuria, we discussed the importance of work-up including assessing the upper and lower GU tract with CT urogram and cystoscopy.  ? ?2. SUI ?- We discussed the option of wearing a pessary to see if she has improvement with her leakage.  ? ?3. OAB ?- Although did not demonstrate on urodynamic testing, we reviewed she is likely still experiencing some overactive bladder sensations. May start medication at future visit as a trial.  ?- also continue with pelvic PT ? ?Return for cystoscopy ? ?Jaquita Folds, MD ? ? ?Time spent: I spent 35 minutes dedicated to the care of this patient on the date of this encounter to include pre-visit review of records, face-to-face time with the patient and post visit documentation and ordering medication/ testing.  ? ? ?

## 2021-07-09 ENCOUNTER — Ambulatory Visit (INDEPENDENT_AMBULATORY_CARE_PROVIDER_SITE_OTHER): Payer: Medicaid Other | Admitting: Family Medicine

## 2021-07-20 ENCOUNTER — Other Ambulatory Visit: Payer: Self-pay

## 2021-07-20 ENCOUNTER — Ambulatory Visit: Payer: Medicaid Other

## 2021-07-20 ENCOUNTER — Ambulatory Visit (HOSPITAL_BASED_OUTPATIENT_CLINIC_OR_DEPARTMENT_OTHER)
Admission: RE | Admit: 2021-07-20 | Discharge: 2021-07-20 | Disposition: A | Discharge status: Home / Self Care | Payer: Medicaid Other | Source: Ambulatory Visit | Attending: Obstetrics and Gynecology | Admitting: Obstetrics and Gynecology

## 2021-07-20 ENCOUNTER — Encounter (HOSPITAL_BASED_OUTPATIENT_CLINIC_OR_DEPARTMENT_OTHER): Payer: Self-pay

## 2021-07-20 DIAGNOSIS — R293 Abnormal posture: Secondary | ICD-10-CM

## 2021-07-20 DIAGNOSIS — M6281 Muscle weakness (generalized): Secondary | ICD-10-CM | POA: Diagnosis not present

## 2021-07-20 DIAGNOSIS — R310 Gross hematuria: Secondary | ICD-10-CM | POA: Insufficient documentation

## 2021-07-20 DIAGNOSIS — R279 Unspecified lack of coordination: Secondary | ICD-10-CM

## 2021-07-20 MED ORDER — IOHEXOL 350 MG/ML SOLN
125.0000 mL | Freq: Once | INTRAVENOUS | Status: AC | PRN
  Administered 2021-07-20: 125 mL via INTRAVENOUS

## 2021-07-20 NOTE — Therapy (Signed)
[facility] ?[facility] @ [facility] ?[place], [place], [address] ?Phone: [phone]   Fax:  [phone] ? ?Physical Therapy Treatment ? ?Patient Details  ?Name: Diana Blair ?MRN: [id number] ?Date of Birth: 06-26-1980 ?Referring Provider (PT): Jaquita Folds, MD ? ? ?Encounter Date: 07/20/2021 ? ? PT End of Session - 07/20/21 1148   ? ? Visit Number 5   ? Date for PT Re-Evaluation 08/20/21   ? Authorization Type UHC Medicaid   ? Authorization - Visit Number 27   ? PT Start Time 1147   ? PT Stop Time 1226   ? PT Time Calculation (min) 39 min   ? Activity Tolerance Patient tolerated treatment well   ? Behavior During Therapy [facility] for tasks assessed/performed   ? ?  ?  ? ?  ? ? ?Past Medical History:  ?Diagnosis Date  ? Acid reflux   ? Anemia   ? B12 deficiency   ? Bilateral swelling of feet   ? Chronic hypertension in obstetric context in first trimester 05/04/2016  ? Diverticulitis   ? Heart palpitations   ? High cholesterol   ? Hypertension   ? Morbid obesity with BMI of 50.0-59.9, adult ([place])   ? BMI 58.8  ? PCOS (polycystic ovarian syndrome)   ? Prediabetes   ? Sarcoidosis   ? Seizure ([place])   ? as a child  ? Sleep apnea   ? SOB (shortness of breath)   ? Vitamin D deficiency   ? ? ?Past Surgical History:  ?Procedure Laterality Date  ? CERVICAL CERCLAGE  03/13/2012  ? Procedure: CERCLAGE CERVICAL;  Surgeon: Jonnie Kind, MD;  Location: [facility];  Service: Gynecology;  Laterality: N/A;  ? CERVICAL CERCLAGE  03/17/2012  ? Procedure: CERCLAGE CERVICAL;  Surgeon: Osborne Oman, MD;  Location: [facility];  Service: Gynecology;  Laterality: N/A;  Removal  ? COLONOSCOPY WITH PROPOFOL N/A 10/02/2020  ? Procedure: COLONOSCOPY WITH PROPOFOL;  Surgeon: Milus Banister, MD;  [facility];  Service: Endoscopy;  Laterality: N/A;  ? ESOPHAGOGASTRODUODENOSCOPY (EGD) WITH PROPOFOL N/A 10/02/2020  ? Procedure: ESOPHAGOGASTRODUODENOSCOPY (EGD) WITH PROPOFOL;  Surgeon:  Milus Banister, MD;  [facility];  Service: Endoscopy;  Laterality: N/A;  ? POLYPECTOMY  10/02/2020  ? Procedure: POLYPECTOMY;  Surgeon: Milus Banister, MD;  Location: [facility];  Service: Endoscopy;;  ? ? ?There were no vitals filed for this visit. ? ? Subjective Assessment - 07/20/21 1148   ? ? Subjective Pt states that she has been very busy with daughter being ill and not able to get to her own exercises very often. She states that she is still having severe urgency. She feels like she is able to relax a little better so she isn't making urgency worse. She did have CT scan performed on lower abdomen today and is waiting on results. She doesn'treally feel like she has any stress incontinence. She is going to be fit for a pessary - she is looking for a pessary that can stay in place due to body weakness and decreased flexibility.   ? Patient Stated Goals To get control over bladder and bowels.   ? Currently in Pain? No/denies   ? Multiple Pain Sites No   ? ?  ?  ? ?  ? ? ? ? ? ? ? ? ? ? ? ? ? ? ? ? ? ? ? ? [facility] Adult PT Treatment/Exercise - 07/20/21 0001   ? ?  ?  Self-Care  ? Self-Care Other Self-Care Comments   pt education on cane, braces, HEP progressions  ?  ? Lumbar Exercises: Standing  ? Other Standing Lumbar Exercises Standing cat/cow 2 x 10; standing lateral weight shifting 2 x 10; stnading forward back weight shifting 2 x 10 bil   ?  ? Lumbar Exercises: Seated  ? Other Seated Lumbar Exercises seated D2 yellow band 10x bil; seated anterior weight hold with march 10x bil; seated overhead press 5lbs 5x bil with heel raise and core facilitation   ? Other Seated Lumbar Exercises Seated hip adduction with pelvic floor contraction/core faiclitation 10 x 10 sec hold   ? ?  ?  ? ?  ? ? ? ? ? ? ? ? ? ? PT Education - 07/20/21 1217   ? ? Education Details HEP updates; pessary and how it may be helpful; regular pelvic floor contraction performance; switching hands iwth cane for better support;  avoiding knee braces and how they may be more harmful than good.   ? Person(s) Educated Patient   ? Methods Explanation;Demonstration;Tactile cues;Verbal cues;Handout   ? Comprehension Verbalized understanding   ? ?  ?  ? ?  ? ? ? PT Short Term Goals - 07/20/21 1204   ? ?  ? PT SHORT TERM GOAL #1  ? Title Pt will be independent with HEP.   ? Time 4   ? Period Weeks   ? Status Achieved   ? Target Date 06/25/21   ?  ? PT SHORT TERM GOAL #2  ? Title Pt will be independent and provide teach back of urge suppression technique and the knack in order to help decrease urinary incontinence.   ? Time 4   ? Period Weeks   ? Status Partially Met   ? Target Date 06/25/21   ? ?  ?  ? ?  ? ? ? ? PT Long Term Goals - 07/20/21 1205   ? ?  ? PT LONG TERM GOAL #1  ? Title Pt will be independent with advanced HEP.   ? Time 4   ? Period Weeks   ? Status Partially Met   ? Target Date 08/20/21   ?  ? PT LONG TERM GOAL #2  ? Title Pt will be able to correctly perform diaphragmatic breathing and appropriate pressure management in order to prevent worsening vaginal wall laxity and improve pelvic floor A/ROM.   ? Time 12   ? Period Weeks   ? Status Partially Met   ? Target Date 08/20/21   ?  ? PT LONG TERM GOAL #3  ? Title Pt will report no leaks with laughing, coughing, sneezing in order to improve comfort with interpersonal relationships and community activities.   ? Time 12   ? Period Weeks   ? Status Partially Met   ? Target Date 08/20/21   ?  ? PT LONG TERM GOAL #4  ? Title Pt will improve gait pattern and speed in order to be able to get to bathroom with increased ease, decreasing functional incontinence.   ? Time 12   ? Period Weeks   ? Status Partially Met   ? Target Date 08/20/21   ?  ? PT LONG TERM GOAL #5  ? Title Pt will be able to go 2 hours in between voids without urgency or incontinence in order to improve QOL and perform all functional activities with less difficulty.   ? Time 12   ? Period Weeks   ?  Status Partially Met   ?  Target Date 08/20/21   ? ?  ?  ? ?  ? ? ? ? ? ? ? ? Plan - 07/20/21 1206   ? ? Clinical Impression Statement Patient is starting to see some amount of press with decreased urgency, but it is still present. Believe that pessary is great option to help support pelivc floor; we are wondering if some of the sensation of urine gong somewhere else before there is any leaking may be bladder descending into vaignal canal. Pt education performed on bent over supported positions to perform pelvic floor contraction to help improve blader position. She did well with functional strengthening progressions to help improve pelvic floor/abdominal contraction. We reviewed the improtance of regular pelvic floor strengthening at minimum even if she is not getting to other exericses. She will continue to benefit from skilled PT intervention in order to progress pelvic floor strengthening and decrease urinary/fecal incontinence.   ? PT Frequency 1x / week   ? PT Duration 2 weeks   ? PT Treatment/Interventions ADLs/Self Care Home Management;Biofeedback;Cryotherapy;Electrical Stimulation;Moist Heat;Therapeutic activities;Therapeutic exercise;Neuromuscular re-education;Manual techniques;Patient/family education;Scar mobilization;Passive range of motion;Dry needling;Spinal Manipulations   ? PT Next Visit Plan Progress pelvic floor strengthening to functional movements; progress core/hip strengthening.   ? PT Home Exercise Plan [id number]   ? Consulted and Agree with Plan of Care Patient   ? ?  ?  ? ?  ? ? ?Patient will benefit from skilled therapeutic intervention in order to improve the following deficits and impairments:  Decreased coordination, Decreased range of motion, Increased fascial restricitons, Impaired tone, Decreased endurance, Increased muscle spasms, Pain, Decreased activity tolerance, Hypomobility, Impaired flexibility, Improper body mechanics, Postural dysfunction, Decreased mobility, Decreased strength ? ?Visit  Diagnosis: ?Muscle weakness (generalized) ? ?Abnormal posture ? ?Unspecified lack of coordination ? ? ? ? ?Problem List ?Patient Active Problem List  ? Diagnosis Date Noted  ? Urinary incontinence 04/02/2021  ? Abnormal uterine

## 2021-08-17 ENCOUNTER — Ambulatory Visit: Payer: Medicaid Other | Attending: Obstetrics and Gynecology

## 2021-08-17 DIAGNOSIS — R293 Abnormal posture: Secondary | ICD-10-CM | POA: Diagnosis present

## 2021-08-17 DIAGNOSIS — R279 Unspecified lack of coordination: Secondary | ICD-10-CM | POA: Insufficient documentation

## 2021-08-17 DIAGNOSIS — M6281 Muscle weakness (generalized): Secondary | ICD-10-CM | POA: Insufficient documentation

## 2021-08-17 NOTE — Therapy (Signed)
?OUTPATIENT PHYSICAL THERAPY FEMALE PELVIC EVALUATION ? ? ?Patient Name: Diana Blair ?MRN: [id number] ?DOB:[birth year], 41 y.o., female ?Today's Date: 08/17/2021 ? ? PT End of Session - 08/17/21 1112   ? ? Visit Number 6   ? Date for PT Re-Evaluation 10/12/21   ? Authorization Type [employer] Medicaid   ? PT Start Time 1112   ? PT Stop Time 1145   ? PT Time Calculation (min) 33 min   ? Activity Tolerance Patient tolerated treatment well   ? Behavior During Therapy [facility] for tasks assessed/performed   ? ?  ?  ? ?  ? ? ?Past Medical History:  ?Diagnosis Date  ? Acid reflux   ? Anemia   ? B12 deficiency   ? Bilateral swelling of feet   ? Chronic hypertension in obstetric context in first trimester 05/04/2016  ? Diverticulitis   ? Heart palpitations   ? High cholesterol   ? Hypertension   ? Morbid obesity with BMI of 50.0-59.9, adult ([place])   ? BMI 58.8  ? PCOS (polycystic ovarian syndrome)   ? Prediabetes   ? Sarcoidosis   ? Seizure ([place])   ? as a child  ? Sleep apnea   ? SOB (shortness of breath)   ? Vitamin D deficiency   ? ?Past Surgical History:  ?Procedure Laterality Date  ? CERVICAL CERCLAGE  03/13/2012  ? Procedure: CERCLAGE CERVICAL;  Surgeon: Jonnie Kind, MD;  Location: [facility];  Service: Gynecology;  Laterality: N/A;  ? CERVICAL CERCLAGE  03/17/2012  ? Procedure: CERCLAGE CERVICAL;  Surgeon: Osborne Oman, MD;  Location: [facility];  Service: Gynecology;  Laterality: N/A;  Removal  ? COLONOSCOPY WITH PROPOFOL N/A 10/02/2020  ? Procedure: COLONOSCOPY WITH PROPOFOL;  Surgeon: Milus Banister, MD;  Location: [facility];  Service: Endoscopy;  Laterality: N/A;  ? ESOPHAGOGASTRODUODENOSCOPY (EGD) WITH PROPOFOL N/A 10/02/2020  ? Procedure: ESOPHAGOGASTRODUODENOSCOPY (EGD) WITH PROPOFOL;  Surgeon: Milus Banister, MD;  Location: [facility];  Service: Endoscopy;  Laterality: N/A;  ? POLYPECTOMY  10/02/2020  ? Procedure: POLYPECTOMY;  Surgeon: Milus Banister, MD;  Location: [facility];  Service: Endoscopy;;  ? ?Patient  Active Problem List  ? Diagnosis Date Noted  ? Urinary incontinence 04/02/2021  ? Abnormal uterine bleeding 03/25/2021  ? Generalized weakness 01/21/2021  ? Bilateral ankle pain 01/21/2021  ? Vitamin D deficiency 01/21/2021  ? Elevated CK 01/21/2021  ? Sleep apnea 12/09/2020  ? Allergic rhinitis 08/20/2020  ? Sarcoidosis 09/07/2019  ? Dysphagia 08/05/2019  ? Gastroesophageal reflux disease 03/20/2019  ? Pulmonary infiltrates on CXR 02/26/2019  ? Upper airway cough syndrome 02/26/2019  ? Advanced maternal age in multigravida 11/10/2016  ? Chronic hypertension in pregnancy 11/10/2016  ? IUGR (intrauterine growth restriction) affecting care of mother, third trimester, other fetus 11/05/2016  ? BMI 60.0-69.9, adult ([place]) 05/04/2016  ? History of preterm delivery 08/20/2015  ? History of cervical cerclage 08/20/2015  ? PCOS (polycystic ovarian syndrome) 02/08/2013  ? ? ?PCP: Berkley Harvey, NP ? ?REFERRING PROVIDER: Jaquita Folds, * ? ?REFERRING DIAG: N32.81 (ICD-10-CM) - Overactive bladder  N81.10 (ICD-10-CM) - Prolapse of anterior vaginal wall  N81.6 (ICD-10-CM) - Prolapse of posterior vaginal wall  ? ?THERAPY DIAG:  ?Muscle weakness (generalized) - Plan: PT plan of care cert/re-cert ? ?Abnormal posture - Plan: PT plan of care cert/re-cert ? ?Unspecified lack of coordination - Plan: PT plan of care cert/re-cert ? ?ONSET DATE: 04/26/2017 ? ?SUBJECTIVE:                                                                                                                                                                                          ? ?  SUBJECTIVE STATEMENT: ?Pt states that she has not been feeling well and is having a very heavy and painful menstrual cycle. She states that tampons are uncomfortable and she cannot perform pelvic floor contractions when she is using them on her menstrual cycle. She is very frustrated with lack of progress and having multiple medical appointments. She states that urgency is still  about the same. She does not that when tampons are in, she feels like the urgency is better. She is having less difficulty with getting to the bathroom. She is noticing improved core strength. She states that she does want to continue treatment.  ? ?Patient confirms identification and approves PT to assess pelvic floor and treatment Yes ? ? ?PAIN:  ?Are you having pain? Yes - menstrual cycle pain ? ?PRECAUTIONS: None ? ? ? ?OBJECTIVE 08/17/21:  ? ?*Internal pelvic exam not performed today due to patient request since she is on her menstrual cycle. Limited exam findings due to late arrival.  ? ?GAIT: ?SPC in Rt hand for Rt antalgic gait pattern despite gait training and education on correct use ? ?POSTURE:  ?Forward head, rounded shoulders ? ?LUMBARAROM/PROM ?Grossly reduced by 50% ? ? ?LE ROM: ?   Decreased B hip extension ? ? ?LE MMT: ?   Functional weakness demonstrated with heavy leaning over single point cane with bil UE in order to rise from chair ? No control of descent into chair ? Bil compensated trendelenburg in standing/ambulation ? ?PELVIC MMT: ? Not assessed today ? ? ?      PALPATION: ?Not assessed today ? ? ?PROLAPSE: ?Anterior/posterior vaginal wall laxity - not noted this session ? ?TODAY'S TREATMENT 08/17/21 ?Self-care: ?Consistency with treatment ?Pessary education ?Avoiding tampon use if painful ? ? ? ?PATIENT EDUCATION:  ?Education details: see above self-care ?Person educated: Patient ?Education method: Explanation, Demonstration, Tactile cues, Verbal cues, and Handouts ?Education comprehension: verbalized understanding ? ? ?HOME EXERCISE PROGRAM: ?[id number] ? ?ASSESSMENT: ? ?CLINICAL IMPRESSION: ?Patient here for re-evaluation after not being present in PT for the last month. She overall reports no change in symptoms of leaking into her vagina and then external leaking once she stands up. She also continues to have urgency, but states that relaxation training has been very helpful. She reports  feeling like her core is getting stronger with exercises. Functionally, she does not demonstrate improved lumbar A/ROM, strength, or mobility. Believe performing an internal pelvic floor exam next session to determine how prolapse is and pelvic floor strength/coordination will be very helpful. We did discuss that due to her symptoms and lack of progress thus far with urinary symptoms, she may want to discuss pessary with MD. We also talked about not using tampons if they are painful to the point that she feels unable to perform exercises. She may continue to benefit from skilled PT intervention in order to address impairments, decrease urinary incontinence, and improve QOL.  ? ? ?OBJECTIVE IMPAIRMENTS Abnormal gait, decreased activity tolerance, decreased coordination, decreased endurance, decreased mobility, decreased ROM, decreased strength, hypomobility, increased fascial restrictions, increased muscle spasms, impaired tone, postural dysfunction, obesity, and pain.  ? ?ACTIVITY LIMITATIONS  ambulation, household chores, community activities, exercise .  ? ?PERSONAL FACTORS 3+ comorbidities: PCOS, prediabetic, sarcoidosis, chronic pain, obesity   are also affecting patient's functional outcome.  ? ? ?REHAB POTENTIAL: Fair due to comorbidities and inconsistent attendence ? ?CLINICAL DECISION MAKING: Stable/uncomplicated ? ?EVALUATION COMPLEXITY: Low ? ? ?GOALS: ?Goals reviewed with patient? Yes ? ?SHORT TERM GOALS: Target date: 09/14/2021 ? ?Pt  will be independent with HEP.  ? ?Baseline: ?Goal status: MET ? ?2.  Pt will be independent and provide teach back of urge suppression technique and the knack in order to help decrease urinary incontinence. ?Baseline: Pt still working to incorporate these techniques into lifestyle ?Goal status: IN PROGRESS ? ? ?LONG TERM GOALS: Target date: 10/12/2021 ? ?Pt will be independent with advanced HEP.  ? ?Baseline: Pt working on progressing HEP.  ?Goal status: IN PROGRESS ? ?2.  Pt  will be able to correctly perform diaphragmatic breathing and appropriate pressure management in order to prevent worsening vaginal wall laxity and improve pelvic floor A/ROM. ?Baseline: Some increase in pre

## 2021-08-24 ENCOUNTER — Ambulatory Visit: Payer: Medicaid Other | Attending: Obstetrics and Gynecology

## 2021-08-24 DIAGNOSIS — M6281 Muscle weakness (generalized): Secondary | ICD-10-CM | POA: Insufficient documentation

## 2021-08-24 DIAGNOSIS — R293 Abnormal posture: Secondary | ICD-10-CM | POA: Diagnosis present

## 2021-08-24 DIAGNOSIS — R279 Unspecified lack of coordination: Secondary | ICD-10-CM | POA: Diagnosis present

## 2021-08-24 NOTE — Therapy (Signed)
?OUTPATIENT PHYSICAL THERAPY TREATMENT NOTE ? ? ?Patient Name: Diana Blair ?MRN: [id number] ?DOB:01-May-1980, 41 y.o., female ?Today's Date: 08/24/2021 ? ?PCP: Berkley Harvey, NP ?REFERRING PROVIDER: Jaquita Folds, MD ? ?END OF SESSION:  ? PT End of Session - 08/24/21 1147   ? ? Visit Number 7   ? Date for PT Re-Evaluation 10/12/21   ? Authorization Type [employer] Medicaid   ? Authorization - Visit Number 27   ? PT Start Time 1146   ? PT Stop Time 1225   ? PT Time Calculation (min) 39 min   ? Activity Tolerance Patient tolerated treatment well   ? Behavior During Therapy [facility] for tasks assessed/performed   ? ?  ?  ? ?  ? ? ?Past Medical History:  ?Diagnosis Date  ? Acid reflux   ? Anemia   ? B12 deficiency   ? Bilateral swelling of feet   ? Chronic hypertension in obstetric context in first trimester 05/04/2016  ? Diverticulitis   ? Heart palpitations   ? High cholesterol   ? Hypertension   ? Morbid obesity with BMI of 50.0-59.9, adult ([place])   ? BMI 58.8  ? PCOS (polycystic ovarian syndrome)   ? Prediabetes   ? Sarcoidosis   ? Seizure ([place])   ? as a child  ? Sleep apnea   ? SOB (shortness of breath)   ? Vitamin D deficiency   ? ?Past Surgical History:  ?Procedure Laterality Date  ? CERVICAL CERCLAGE  03/13/2012  ? Procedure: CERCLAGE CERVICAL;  Surgeon: Jonnie Kind, MD;  Location: [facility];  Service: Gynecology;  Laterality: N/A;  ? CERVICAL CERCLAGE  03/17/2012  ? Procedure: CERCLAGE CERVICAL;  Surgeon: Osborne Oman, MD;  Location: [facility];  Service: Gynecology;  Laterality: N/A;  Removal  ? COLONOSCOPY WITH PROPOFOL N/A 10/02/2020  ? Procedure: COLONOSCOPY WITH PROPOFOL;  Surgeon: Milus Banister, MD;  Location: [facility];  Service: Endoscopy;  Laterality: N/A;  ? ESOPHAGOGASTRODUODENOSCOPY (EGD) WITH PROPOFOL N/A 10/02/2020  ? Procedure: ESOPHAGOGASTRODUODENOSCOPY (EGD) WITH PROPOFOL;  Surgeon: Milus Banister, MD;  Location: [facility];  Service: Endoscopy;  Laterality: N/A;  ? POLYPECTOMY  10/02/2020  ?  Procedure: POLYPECTOMY;  Surgeon: Milus Banister, MD;  Location: [facility];  Service: Endoscopy;;  ? ?Patient Active Problem List  ? Diagnosis Date Noted  ? Urinary incontinence 04/02/2021  ? Abnormal uterine bleeding 03/25/2021  ? Generalized weakness 01/21/2021  ? Bilateral ankle pain 01/21/2021  ? Vitamin D deficiency 01/21/2021  ? Elevated CK 01/21/2021  ? Sleep apnea 12/09/2020  ? Allergic rhinitis 08/20/2020  ? Sarcoidosis 09/07/2019  ? Dysphagia 08/05/2019  ? Gastroesophageal reflux disease 03/20/2019  ? Pulmonary infiltrates on CXR 02/26/2019  ? Upper airway cough syndrome 02/26/2019  ? Advanced maternal age in multigravida 11/10/2016  ? Chronic hypertension in pregnancy 11/10/2016  ? IUGR (intrauterine growth restriction) affecting care of mother, third trimester, other fetus 11/05/2016  ? BMI 60.0-69.9, adult ([place]) 05/04/2016  ? History of preterm delivery 08/20/2015  ? History of cervical cerclage 08/20/2015  ? PCOS (polycystic ovarian syndrome) 02/08/2013  ? ? ?REFERRING DIAG: N81.10 (ICD-10-CM) - Prolapse of anterior vaginal wall ?N81.6 (ICD-10-CM) - Prolapse of posterior vaginal wall ?N32.81 (ICD-10-CM) - Overactive bladder  ? ?THERAPY DIAG:  ?Muscle weakness (generalized) ? ?Abnormal posture ? ?Unspecified lack of coordination ? ?PERTINENT HISTORY: PCOS, prediabetic  ? ?PRECAUTIONS: NA ? ?SUBJECTIVE: Pt states that she is having a lot of low back pain and neck pain  today. She does feel like she is able to hold her urine a little bit better and relaxation is very helpful. ? ? ?SUBJECTIVE STATEMENT: ?Pt states that she has not been feeling well and is having a very heavy and painful menstrual cycle. She states that tampons are uncomfortable and she cannot perform pelvic floor contractions when she is using them on her menstrual cycle. She is very frustrated with lack of progress and having multiple medical appointments. She states that urgency is still about the same. She does not that when tampons  are in, she feels like the urgency is better. She is having less difficulty with getting to the bathroom. She is noticing improved core strength. She states that she does want to continue treatment.  ?  ?Patient confirms identification and approves PT to assess pelvic floor and treatment Yes ?  ?  ?PAIN:  ?Are you having pain? Yes - menstrual cycle pain ?  ?PRECAUTIONS: None ?  ?  ?  ?OBJECTIVE 08/24/21: pelvic floor strength 3/5, endurance 4-5 seconds, repetitions 6; anterior/posterior vaginal wall laxity grade 2 in supine only (pt preferred to only be supine and deferred other positions for pelvic floor exam).  ? ? ?08/17/21:  ?  ?*Internal pelvic exam not performed today due to patient request since she is on her menstrual cycle. Limited exam findings due to late arrival.  ?  ?GAIT: ?SPC in Rt hand for Rt antalgic gait pattern despite gait training and education on correct use ?  ?POSTURE:  ?Forward head, rounded shoulders ?  ?LUMBARAROM/PROM ?Grossly reduced by 50% ?  ?  ?LE ROM: ?              Decreased B hip extension ?  ?  ?LE MMT: ?              Functional weakness demonstrated with heavy leaning over single point cane with bil UE in order to rise from chair ?            No control of descent into chair ?            Bil compensated trendelenburg in standing/ambulation ?  ?PELVIC MMT: ? Not assessed today ?  ?  ?      PALPATION: ?Not assessed today ?  ?  ?PROLAPSE: ?Anterior/posterior vaginal wall laxity - not noted this session ?  ?TODAY'S TREATMENT 08/24/21: ?Manual: ?Soft tissue mobilization: ?Scar tissue mobilization: ?Myofascial release: ?Spinal mobilization: ?Internal pelvic floor techniques: ?No emotional/communication barriers or cognitive limitation. Patient is motivated to learn. Patient understands and agrees with treatment goals and plan. PT explains patient will be examined in standing, sitting, and lying down to see how their muscles and joints work. When they are ready, they will be asked to remove  their underwear so PT can examine their perineum. The patient is also given the option of providing their own chaperone as one is not provided in our facility. The patient also has the right and is explained the right to defer or refuse any part of the evaluation or treatment including the internal exam. With the patient's consent, PT will use one gloved finger to gently assess the muscles of the pelvic floor, seeing how well it contracts and relaxes and if there is muscle symmetry. After, the patient will get dressed and PT and patient will discuss exam findings and plan of care. PT and patient discuss plan of care, schedule, attendance policy and HEP activities. ?Dry needling: ?Neuromuscular re-education: ?Core retraining:  ?  Core facilitation: ?Form correction: ?Pelvic floor contraction training: ?Down training: ?Exercises: ?Stretches/mobility: ?Strengthening: ?Therapeutic activities: ?Functional strengthening activities: ?Bed mobility training with exhale/effort ?Self-care: ?Extensive education on pelvic floor anatomy and prolapse being probably cause of all symptoms/sensations ?Abdominal pressure management ?Review of pelvic floor strengthening program: ?10 maximal quick flicks, 5 10 second long holds, 3x/day, all positions ? ? ?TREATMENT 08/17/21 ?Self-care: ?Consistency with treatment ?Pessary education ?Avoiding tampon use if painful ?  ?  ?  ?PATIENT EDUCATION:  ?Education details: see above self-care ?Person educated: Patient ?Education method: Explanation, Demonstration, Tactile cues, Verbal cues, and Handouts ?Education comprehension: verbalized understanding ?  ?  ?HOME EXERCISE PROGRAM: ?[id number] ?  ?ASSESSMENT: ?  ?CLINICAL IMPRESSION: ?Continued discussion of pelvic floor anatomy and how prolapse may be responsible for the sensations that she is experiencing - used images to help describe this. We did an internal pelvic floor exam to reassess strength and vaginal wall laxity. Upon exam, no abnormal  findings found with pain/discomfort; she does have anterior and posterior vaginal wall laxity; pelvic floor coordination is good, but strength is 3/5 and endurance is 4-5 seconds. We reviewed pelvic flo

## 2021-08-31 ENCOUNTER — Ambulatory Visit: Payer: Medicaid Other

## 2021-08-31 DIAGNOSIS — M6281 Muscle weakness (generalized): Secondary | ICD-10-CM

## 2021-08-31 DIAGNOSIS — R293 Abnormal posture: Secondary | ICD-10-CM

## 2021-08-31 DIAGNOSIS — R279 Unspecified lack of coordination: Secondary | ICD-10-CM

## 2021-08-31 NOTE — Therapy (Signed)
?OUTPATIENT PHYSICAL THERAPY TREATMENT NOTE ? ? ?Patient Name: Diana Blair ?MRN: [id number] ?DOB:[birth year], 41 y.o., female ?Today's Date: 08/31/2021 ? ?PCP: Berkley Harvey, NP ?REFERRING PROVIDER: Jaquita Folds, MD ? ?END OF SESSION:  ? PT End of Session - 08/31/21 1146   ? ? Visit Number 8   ? Date for PT Re-Evaluation 10/12/21   ? Authorization Type [employer]   ? PT Start Time 1145   ? PT Stop Time 1225   ? PT Time Calculation (min) 40 min   ? Activity Tolerance Patient tolerated treatment well   ? Behavior During Therapy [facility] for tasks assessed/performed   ? ?  ?  ? ?  ? ? ? ?Past Medical History:  ?Diagnosis Date  ? Acid reflux   ? Anemia   ? B12 deficiency   ? Bilateral swelling of feet   ? Chronic hypertension in obstetric context in first trimester 05/04/2016  ? Diverticulitis   ? Heart palpitations   ? High cholesterol   ? Hypertension   ? Morbid obesity with BMI of 50.0-59.9, adult ([place])   ? BMI 58.8  ? PCOS (polycystic ovarian syndrome)   ? Prediabetes   ? Sarcoidosis   ? Seizure ([place])   ? as a child  ? Sleep apnea   ? SOB (shortness of breath)   ? Vitamin D deficiency   ? ?Past Surgical History:  ?Procedure Laterality Date  ? CERVICAL CERCLAGE  03/13/2012  ? Procedure: CERCLAGE CERVICAL;  Surgeon: Jonnie Kind, MD;  Location: [facility];  Service: Gynecology;  Laterality: N/A;  ? CERVICAL CERCLAGE  03/17/2012  ? Procedure: CERCLAGE CERVICAL;  Surgeon: Osborne Oman, MD;  Location: [facility];  Service: Gynecology;  Laterality: N/A;  Removal  ? COLONOSCOPY WITH PROPOFOL N/A 10/02/2020  ? Procedure: COLONOSCOPY WITH PROPOFOL;  Surgeon: Milus Banister, MD;  Location: [facility];  Service: Endoscopy;  Laterality: N/A;  ? ESOPHAGOGASTRODUODENOSCOPY (EGD) WITH PROPOFOL N/A 10/02/2020  ? Procedure: ESOPHAGOGASTRODUODENOSCOPY (EGD) WITH PROPOFOL;  Surgeon: Milus Banister, MD;  Location: [facility];  Service: Endoscopy;  Laterality: N/A;  ? POLYPECTOMY  10/02/2020  ? Procedure: POLYPECTOMY;  Surgeon:  Milus Banister, MD;  Location: [facility];  Service: Endoscopy;;  ? ?Patient Active Problem List  ? Diagnosis Date Noted  ? Urinary incontinence 04/02/2021  ? Abnormal uterine bleeding 03/25/2021  ? Generalized weakness 01/21/2021  ? Bilateral ankle pain 01/21/2021  ? Vitamin D deficiency 01/21/2021  ? Elevated CK 01/21/2021  ? Sleep apnea 12/09/2020  ? Allergic rhinitis 08/20/2020  ? Sarcoidosis 09/07/2019  ? Dysphagia 08/05/2019  ? Gastroesophageal reflux disease 03/20/2019  ? Pulmonary infiltrates on CXR 02/26/2019  ? Upper airway cough syndrome 02/26/2019  ? Advanced maternal age in multigravida 11/10/2016  ? Chronic hypertension in pregnancy 11/10/2016  ? IUGR (intrauterine growth restriction) affecting care of mother, third trimester, other fetus 11/05/2016  ? BMI 60.0-69.9, adult ([place]) 05/04/2016  ? History of preterm delivery 08/20/2015  ? History of cervical cerclage 08/20/2015  ? PCOS (polycystic ovarian syndrome) 02/08/2013  ? ? ?REFERRING DIAG: N81.10 (ICD-10-CM) - Prolapse of anterior vaginal wall ?N81.6 (ICD-10-CM) - Prolapse of posterior vaginal wall ?N32.81 (ICD-10-CM) - Overactive bladder  ? ?THERAPY DIAG:  ?Muscle weakness (generalized) ? ?Abnormal posture ? ?Unspecified lack of coordination ? ?PERTINENT HISTORY: PCOS, prediabetic  ? ?PRECAUTIONS: NA ? ?SUBJECTIVE: Pt states that she feels like she is still recovering from pulling a muscle in her low back. No changes in urinary  symptoms.  ? ? ?SUBJECTIVE STATEMENT: ?Pt states that she has not been feeling well and is having a very heavy and painful menstrual cycle. She states that tampons are uncomfortable and she cannot perform pelvic floor contractions when she is using them on her menstrual cycle. She is very frustrated with lack of progress and having multiple medical appointments. She states that urgency is still about the same. She does not that when tampons are in, she feels like the urgency is better. She is having less difficulty with  getting to the bathroom. She is noticing improved core strength. She states that she does want to continue treatment.  ?  ?Patient confirms identification and approves PT to assess pelvic floor and treatment Yes ?  ?  ?PAIN:  ?Are you having pain? Yes - menstrual cycle pain ?  ?PRECAUTIONS: None ?  ?  ?  ?OBJECTIVE 08/24/21: pelvic floor strength 3/5, endurance 4-5 seconds, repetitions 6; anterior/posterior vaginal wall laxity grade 2 in supine only (pt preferred to only be supine and deferred other positions for pelvic floor exam).  ? ? ?08/17/21:  ?  ?*Internal pelvic exam not performed today due to patient request since she is on her menstrual cycle. Limited exam findings due to late arrival.  ?  ?GAIT: ?SPC in Rt hand for Rt antalgic gait pattern despite gait training and education on correct use ?  ?POSTURE:  ?Forward head, rounded shoulders ?  ?LUMBARAROM/PROM ?Grossly reduced by 50% ?  ?  ?LE ROM: ?              Decreased B hip extension ?  ?  ?LE MMT: ?              Functional weakness demonstrated with heavy leaning over single point cane with bil UE in order to rise from chair ?            No control of descent into chair ?            Bil compensated trendelenburg in standing/ambulation ?  ?PELVIC MMT: ? Not assessed today ?  ?  ?      PALPATION: ?Not assessed today ?  ?  ?PROLAPSE: ?Anterior/posterior vaginal wall laxity - not noted this session ?  ?TODAY'S TREATMENT 08/31/21: ?Manual: ?Soft tissue mobilization: ?Scar tissue mobilization: ?Myofascial release: ?Spinal mobilization: ?Internal pelvic floor techniques: ?Dry needling: ?Neuromuscular re-education: ?Core retraining: transversus abdominus training in standing with use of table press to help facilitate appropriate contraction 15x with exhale ?Core facilitation: ?Bilateral seated march with 5lb weights on thighs - cues for decreasing posterior lean with lift and breath coordination, 2 x 10 ?Bil UE extension in seated (PT holding band) 2 x 10 with breath  coordination and cues for core facilitation ?Form correction: ?Pelvic floor contraction training: ?Down training: ?Exercises: ?Stretches/mobility: ?Strengthening: ?Seated step taps 2 x 40 ?Seated rows (PT holding band), green band, 2 x 10 ?Seated heel raises ?Therapeutic activities: ?Functional strengthening activities: ?Self-care: ? ? ? ?TREATMENT 08/24/21: ?Manual: ?Soft tissue mobilization: ?Scar tissue mobilization: ?Myofascial release: ?Spinal mobilization: ?Internal pelvic floor techniques: ?No emotional/communication barriers or cognitive limitation. Patient is motivated to learn. Patient understands and agrees with treatment goals and plan. PT explains patient will be examined in standing, sitting, and lying down to see how their muscles and joints work. When they are ready, they will be asked to remove their underwear so PT can examine their perineum. The patient is also given the option of providing their own chaperone  as one is not provided in our facility. The patient also has the right and is explained the right to defer or refuse any part of the evaluation or treatment including the internal exam. With the patient's consent, PT will use one gloved finger to gently assess the muscles of the pelvic floor, seeing how well it contracts and relaxes and if there is muscle symmetry. After, the patient will get dressed and PT and patient will discuss exam findings and plan of care. PT and patient discuss plan of care, schedule, attendance policy and HEP activities. ?Dry needling: ?Neuromuscular re-education: ?Core retraining:  ?Core facilitation: ?Form correction: ?Pelvic floor contraction training: ?Down training: ?Exercises: ?Stretches/mobility: ?Strengthening: ?Therapeutic activities: ?Functional strengthening activities: ?Bed mobility training with exhale/effort ?Self-care: ?Extensive education on pelvic floor anatomy and prolapse being probably cause of all symptoms/sensations ?Abdominal pressure  management ?Review of pelvic floor strengthening program: ?10 maximal quick flicks, 5 10 second long holds, 3x/day, all positions ? ? ?TREATMENT 08/17/21 ?Self-care: ?Consistency with treatment ?Pessary educati

## 2021-09-02 ENCOUNTER — Ambulatory Visit
Admission: EM | Admit: 2021-09-02 | Discharge: 2021-09-02 | Disposition: A | Discharge status: Home / Self Care | Payer: Medicaid Other | Attending: Emergency Medicine | Admitting: Emergency Medicine

## 2021-09-02 DIAGNOSIS — M62838 Other muscle spasm: Secondary | ICD-10-CM

## 2021-09-02 DIAGNOSIS — J302 Other seasonal allergic rhinitis: Secondary | ICD-10-CM

## 2021-09-02 DIAGNOSIS — M62830 Muscle spasm of back: Secondary | ICD-10-CM | POA: Diagnosis not present

## 2021-09-02 DIAGNOSIS — J3089 Other allergic rhinitis: Secondary | ICD-10-CM

## 2021-09-02 MED ORDER — IBUPROFEN 600 MG PO TABS: 600.0000 mg | ORAL_TABLET | Freq: Three times a day (TID) | ORAL | 0 refills | Status: AC | PRN

## 2021-09-02 MED ORDER — BACLOFEN 10 MG PO TABS: 10.0000 mg | ORAL_TABLET | Freq: Every day | ORAL | 0 refills | Status: AC

## 2021-09-02 NOTE — ED Provider Notes (Signed)
?[facility] ? ? ? ?CSN: [id number] ?Arrival date & time: 09/02/21  0940 ?  ? ?HISTORY  ? ?Chief Complaint  ?Patient presents with  ? Nasal Congestion  ? Cough  ? ?HPI ?Diana Blair is a 41 y.o. female. Patient presents urgent care today complaining of nasal congestion and cough.  Patient reports a history of seasonal allergies, currently taking Zyrtec consistently.  Patient states that her daughter, who has been sick recently, sneeze on her yesterday and adds that whenever her daughter gets that she always gets.  Patient states her daughter is feeling better at this time however she herself is not.  Patient states she has not tried any other medications besides Zyrtec for symptoms. ? ?Patient states also having lower back pain and neck pain due to muscle spasm which is not new. ? ?The history is provided by the patient.  ?Past Medical History:  ?Diagnosis Date  ? Acid reflux   ? Anemia   ? B12 deficiency   ? Bilateral swelling of feet   ? Chronic hypertension in obstetric context in first trimester 05/04/2016  ? Diverticulitis   ? Heart palpitations   ? High cholesterol   ? Hypertension   ? Morbid obesity with BMI of 50.0-59.9, adult ([place])   ? BMI 58.8  ? PCOS (polycystic ovarian syndrome)   ? Prediabetes   ? Sarcoidosis   ? Seizure ([place])   ? as a child  ? Sleep apnea   ? SOB (shortness of breath)   ? Vitamin D deficiency   ? ?Patient Active Problem List  ? Diagnosis Date Noted  ? Urinary incontinence 04/02/2021  ? Abnormal uterine bleeding 03/25/2021  ? Generalized weakness 01/21/2021  ? Bilateral ankle pain 01/21/2021  ? Vitamin D deficiency 01/21/2021  ? Elevated CK 01/21/2021  ? Sleep apnea 12/09/2020  ? Allergic rhinitis 08/20/2020  ? Sarcoidosis 09/07/2019  ? Dysphagia 08/05/2019  ? Gastroesophageal reflux disease 03/20/2019  ? Pulmonary infiltrates on CXR 02/26/2019  ? Upper airway cough syndrome 02/26/2019  ? Advanced maternal age in multigravida 11/10/2016  ? Chronic hypertension in pregnancy  11/10/2016  ? IUGR (intrauterine growth restriction) affecting care of mother, third trimester, other fetus 11/05/2016  ? BMI 60.0-69.9, adult ([place]) 05/04/2016  ? History of preterm delivery 08/20/2015  ? History of cervical cerclage 08/20/2015  ? PCOS (polycystic ovarian syndrome) 02/08/2013  ? ?Past Surgical History:  ?Procedure Laterality Date  ? CERVICAL CERCLAGE  03/13/2012  ? Procedure: CERCLAGE CERVICAL;  Surgeon: Jonnie Kind, MD;  Location: [facility];  Service: Gynecology;  Laterality: N/A;  ? CERVICAL CERCLAGE  03/17/2012  ? Procedure: CERCLAGE CERVICAL;  Surgeon: Osborne Oman, MD;  Location: [facility];  Service: Gynecology;  Laterality: N/A;  Removal  ? COLONOSCOPY WITH PROPOFOL N/A 10/02/2020  ? Procedure: COLONOSCOPY WITH PROPOFOL;  Surgeon: Milus Banister, MD;  Location: [facility];  Service: Endoscopy;  Laterality: N/A;  ? ESOPHAGOGASTRODUODENOSCOPY (EGD) WITH PROPOFOL N/A 10/02/2020  ? Procedure: ESOPHAGOGASTRODUODENOSCOPY (EGD) WITH PROPOFOL;  Surgeon: Milus Banister, MD;  Location: [facility];  Service: Endoscopy;  Laterality: N/A;  ? POLYPECTOMY  10/02/2020  ? Procedure: POLYPECTOMY;  Surgeon: Milus Banister, MD;  Location: [facility];  Service: Endoscopy;;  ? ?OB History   ? ? Gravida  ?2  ? Para  ?2  ? Term  ?1  ? Preterm  ?1  ? AB  ?0  ? Living  ?2  ?  ? ? SAB  ?0  ?  IAB  ?0  ? Ectopic  ?0  ? Multiple  ?0  ? Live Births  ?1  ?   ?  ?  ? ?Home Medications   ? ?Prior to Admission medications   ?Medication Sig Start Date End Date Taking? Authorizing Provider  ?Biotin w/ Vitamins C & E (HAIR/SKIN/NAILS) 1250-7.5-7.5 MCG-MG-UNT CHEW Chew by mouth.    [provider]  ?cetirizine (ZYRTEC) 10 MG tablet Take 10 mg by mouth daily.    [provider]  ?Cholecalciferol (D3-1000) 25 MCG (1000 UT) capsule Take 1,000 Units by mouth daily.    [provider]  ?famotidine (PEPCID) 20 MG tablet Take 1 tablet (20 mg total) by mouth at bedtime. ?Patient taking differently: Take  40 mg by mouth at bedtime. 08/05/20   Milus Banister, MD  ?Ferrous Sulfate Dried (HIGH POTENCY IRON) 65 MG TABS Take by mouth.    [provider]  ?fluticasone (FLONASE) 50 MCG/ACT nasal spray Place 2 sprays into both nostrils daily. 08/22/20   Collene Gobble, MD  ?methylcellulose (CITRUCEL) oral powder Take 1 packet by mouth daily. 08/05/20   Milus Banister, MD  ?pantoprazole (PROTONIX) 40 MG tablet TAKE 1 TABLET(40 MG) BY MOUTH DAILY 10/29/20   Tanda Rockers, MD  ?pyridOXINE (VITAMIN B-6) 100 MG tablet Take 100 mg by mouth daily.    [provider]  ?vitamin B-12 (CYANOCOBALAMIN) 1000 MCG tablet Take 1,000 mcg by mouth daily.    [provider]  ?albuterol (PROVENTIL HFA;VENTOLIN HFA) 108 (90 Base) MCG/ACT inhaler Inhale 1-2 puffs into the lungs every 4 (four) hours as needed for wheezing or shortness of breath (or cough). 04/01/18 03/20/19  Street, [place], PA-C  ? ?Family History ?Family History  ?Problem Relation Age of Onset  ? Cancer Mother   ?     lung  ? Hypertension Mother   ? Hyperlipidemia Father   ? Hypertension Father   ? Diabetes Father   ? Heart failure Father   ? Hypertension Sister   ? Eczema Sister   ? Hypertension Brother   ? Diabetes Paternal Grandmother   ? Other Neg Hx   ? Colon cancer Neg Hx   ? Pancreatic cancer Neg Hx   ? Esophageal cancer Neg Hx   ? ?Social History ?Social History  ? ?Tobacco Use  ? Smoking status: Former  ?  Packs/day: 1.50  ?  Years: 6.00  ?  Pack years: 9.00  ?  Types: Cigarettes  ?  Quit date: 02/08/2009  ?  Years since quitting: 12.5  ? Smokeless tobacco: Never  ? Tobacco comments:  ?  Smoked off/on x 6 years  ?Vaping Use  ? Vaping Use: Never used  ?Substance Use Topics  ? Alcohol use: No  ? Drug use: No  ? ?Allergies   ?Patient has no known allergies. ? ?Review of Systems ?Review of Systems ?Pertinent findings noted in history of present illness.  ? ?Physical Exam ?Triage Vital Signs ?ED Triage Vitals  ?Enc Vitals Group  ?   BP 02/20/21  0827 (!) 147/82  ?   Pulse Rate 02/20/21 0827 72  ?   Resp 02/20/21 0827 18  ?   Temp 02/20/21 0827 98.3 ?F (36.8 ?C)  ?   Temp Source 02/20/21 0827 Oral  ?   SpO2 02/20/21 0827 98 %  ?   Weight --   ?   Height --   ?   Head Circumference --   ?  Peak Flow --   ?   Pain Score 02/20/21 0826 5  ?   Pain Loc --   ?   Pain Edu? --   ?   Excl. in [place]? --   ?No data found. ? ?Updated Vital Signs ?BP 134/74 (BP Location: Right Arm)   Pulse 81   Temp 98.7 ?F (37.1 ?C) (Oral)   Resp 20   SpO2 96%  ? ?Physical Exam ?Vitals and nursing note reviewed.  ?Constitutional:   ?   General: She is awake. She is not in acute distress. ?   Appearance: Normal appearance. She is well-developed and well-groomed. She is morbidly obese. She is not ill-appearing.  ?HENT:  ?   Head: Normocephalic and atraumatic.  ?   Salivary Glands: Right salivary gland is not diffusely enlarged or tender. Left salivary gland is not diffusely enlarged or tender.  ?   Right Ear: Ear canal and external ear normal. No drainage. A middle ear effusion is present. There is no impacted cerumen. Tympanic membrane is bulging. Tympanic membrane is not injected or erythematous.  ?   Left Ear: Ear canal and external ear normal. No drainage. A middle ear effusion is present. There is no impacted cerumen. Tympanic membrane is bulging. Tympanic membrane is not injected or erythematous.  ?   Ears:  ?   Comments: Bilateral EACs normal, both TMs bulging with clear fluid ?   Nose: Rhinorrhea present. No nasal deformity, septal deviation, signs of injury, nasal tenderness, mucosal edema or congestion. Rhinorrhea is clear.  ?   Right Nostril: Occlusion present. No foreign body, epistaxis or septal hematoma.  ?   Left Nostril: Occlusion present. No foreign body, epistaxis or septal hematoma.  ?   Right Turbinates: Enlarged, swollen and pale.  ?   Left Turbinates: Enlarged, swollen and pale.  ?   Right Sinus: No maxillary sinus tenderness or frontal sinus tenderness.  ?   Left  Sinus: No maxillary sinus tenderness or frontal sinus tenderness.  ?   Mouth/Throat:  ?   Lips: Pink. No lesions.  ?   Mouth: Mucous membranes are moist. No oral lesions.  ?   Dentition: Normal dentition.

## 2021-09-02 NOTE — ED Triage Notes (Addendum)
Pt states last night she began having a cough and congestion. ? ?Pt c/o lower back pain and neck pain (muscle spasm).  ?

## 2021-09-02 NOTE — Discharge Instructions (Addendum)
Your symptoms and physical exam findings are concerning for a viral respiratory infection.   ?  ?Based on my physical exam findings and the history you have provided  today, I do not recommend antibiotics at this time.  I do not believe the risks and side effects of antibiotics would outweigh any minimal benefit that they might provide.     ? ?Viral infections, such as the common cold, just need to run their course which is typically 5 to 7 days. ? ?Your symptoms and my physical exam findings are also concerning for exacerbation of your underlying allergies which are normally well controlled with Zyrtec alone.  It is important to keep in mind that is not your allergies that very but the amount of pollen and other environmental allergens around you that vary throughout the year.  Is also important that you are consistent with taking all of your allergy medications exactly as prescribed.  Allergy medications are preventative and therefore only work well when they are taken daily, not "as needed". ?  ?Zyrtec (cetirizine): This is an excellent second-generation antihistamine that helps to reduce respiratory inflammatory response to environmental allergens.  In some patients, this medication can cause daytime sleepiness so I recommend that you take 1 tablet daily at bedtime.   ?  ?Flonase (fluticasone): This is a steroid nasal spray that you use once daily, 1 spray in each nare.  This medication does not work well if you decide to use it only used as you feel you need to, it works best used on a daily basis.  After 3 to 5 days of use, you will notice significant reduction of the inflammation and mucus production that is currently being caused by exposure to allergens, whether seasonal or environmental.  The most common side effect of this medication is nosebleeds.  If you experience a nosebleed, please discontinue use for 1 week, then feel free to resume.  I have provided you with a prescription.  I have also provided  you with a coupon just in case your insurance will not cover it. ? ? ? ?The mainstay of therapy for musculoskeletal pain and spasm is reduction of inflammation and relaxation of tension which is causing inflammation.  Keep in mind, pain always begets more pain.  To help you stay ahead of your pain and inflammation, I have provided the following regimen for you: ?  ?Baclofen 10 mg:  This is a highly effective muscle relaxer and antispasmodic which should continue to provide you with relaxation of your tense muscles, allow you to sleep well and to keep your pain under control. ? ?Ibuprofen 600 mg: This is an effective anti-inflammatory pain medication that you can take 3 times daily as needed for pain. ?  ?During the day, please set aside time to apply ice to the affected area 4 times daily for 20 minutes each application.  This can be achieved by using a bag of frozen peas or corn, a Ziploc bag filled with ice and water, or Ziploc bag filled with half rubbing alcohol and half Dawn dish detergent, frozen into a slush.  Please be careful not to apply ice directly to your skin, always place a soft cloth between you and the ice pack. ?  ?You are welcome to use topical anti-inflammatory creams such as Voltaren gel, capsaicin or Aspercreme as recommended.  These medications are available over-the-counter, please follow manufactures instructions for use.   ?  ?Please consider discussing referral to physical therapy with your primary  care provider.  Physical therapist are very good at teasing out the underlying cause of acute lower back pain and helping with prevention of future recurrences. ?  ?Please avoid attempts to stretch or strengthen the affected area until you are feeling completely pain-free.  Attempts to do so will only prolong the healing process. ? ?  ?If you find that you have not had significant relief of your symptoms in the next 7 to 10 days, please follow-up with your primary care provider or return here to  urgent care for repeat evaluation and further recommendations. ?  ?Thank you for visiting urgent care today.  We appreciate the opportunity to participate in your care. ? ?

## 2021-09-09 ENCOUNTER — Other Ambulatory Visit: Payer: Self-pay | Admitting: Gastroenterology

## 2021-09-14 ENCOUNTER — Ambulatory Visit: Payer: Medicaid Other

## 2021-09-14 DIAGNOSIS — M6281 Muscle weakness (generalized): Secondary | ICD-10-CM

## 2021-09-14 DIAGNOSIS — R279 Unspecified lack of coordination: Secondary | ICD-10-CM

## 2021-09-14 DIAGNOSIS — R293 Abnormal posture: Secondary | ICD-10-CM

## 2021-09-14 NOTE — Therapy (Signed)
OUTPATIENT PHYSICAL THERAPY TREATMENT NOTE   Patient Name: Diana Blair MRN: [id number] DOB:08-Jan-1981, 41 y.o., female Today's Date: 09/14/2021  PCP: Berkley Harvey, NP REFERRING PROVIDER: Jaquita Folds, MD  END OF SESSION:   PT End of Session - 09/14/21 1103     Visit Number 9    Date for PT Re-Evaluation 10/12/21    Authorization Type [employer] Medicaid    PT Start Time 1102    PT Stop Time 1140    PT Time Calculation (min) 38 min    Activity Tolerance Patient tolerated treatment well    Behavior During Therapy WFL for tasks assessed/performed              Past Medical History:  Diagnosis Date   Acid reflux    Anemia    B12 deficiency    Bilateral swelling of feet    Chronic hypertension in obstetric context in first trimester 05/04/2016   Diverticulitis    Heart palpitations    High cholesterol    Hypertension    Morbid obesity with BMI of 50.0-59.9, adult (HCC)    BMI 58.8   PCOS (polycystic ovarian syndrome)    Prediabetes    Sarcoidosis    Seizure ([place])    as a child   Sleep apnea    SOB (shortness of breath)    Vitamin D deficiency    Past Surgical History:  Procedure Laterality Date   CERVICAL CERCLAGE  03/13/2012   Procedure: CERCLAGE CERVICAL;  Surgeon: Jonnie Kind, MD;  Location: [facility];  Service: Gynecology;  Laterality: N/A;   CERVICAL CERCLAGE  03/17/2012   Procedure: CERCLAGE CERVICAL;  Surgeon: Osborne Oman, MD;  Location: [facility];  Service: Gynecology;  Laterality: N/A;  Removal   COLONOSCOPY WITH PROPOFOL N/A 10/02/2020   Procedure: COLONOSCOPY WITH PROPOFOL;  Surgeon: Milus Banister, MD;  Location: [facility];  Service: Endoscopy;  Laterality: N/A;   ESOPHAGOGASTRODUODENOSCOPY (EGD) WITH PROPOFOL N/A 10/02/2020   Procedure: ESOPHAGOGASTRODUODENOSCOPY (EGD) WITH PROPOFOL;  Surgeon: Milus Banister, MD;  Location: [facility];  Service: Endoscopy;  Laterality: N/A;   POLYPECTOMY  10/02/2020   Procedure: POLYPECTOMY;  Surgeon:  Milus Banister, MD;  Location: [facility];  Service: Endoscopy;;   Patient Active Problem List   Diagnosis Date Noted   Urinary incontinence 04/02/2021   Abnormal uterine bleeding 03/25/2021   Generalized weakness 01/21/2021   Bilateral ankle pain 01/21/2021   Vitamin D deficiency 01/21/2021   Elevated CK 01/21/2021   Sleep apnea 12/09/2020   Allergic rhinitis 08/20/2020   Sarcoidosis 09/07/2019   Dysphagia 08/05/2019   Gastroesophageal reflux disease 03/20/2019   Pulmonary infiltrates on CXR 02/26/2019   Upper airway cough syndrome 02/26/2019   Advanced maternal age in multigravida 11/10/2016   Chronic hypertension in pregnancy 11/10/2016   IUGR (intrauterine growth restriction) affecting care of mother, third trimester, other fetus 11/05/2016   BMI 60.0-69.9, adult ([place]) 05/04/2016   History of preterm delivery 08/20/2015   History of cervical cerclage 08/20/2015   PCOS (polycystic ovarian syndrome) 02/08/2013    REFERRING DIAG: N81.10 (ICD-10-CM) - Prolapse of anterior vaginal wall N81.6 (ICD-10-CM) - Prolapse of posterior vaginal wall N32.81 (ICD-10-CM) - Overactive bladder   THERAPY DIAG:  Muscle weakness (generalized)  Abnormal posture  Unspecified lack of coordination  PERTINENT HISTORY: PCOS, prediabetic   PRECAUTIONS: NA  SUBJECTIVE: Pt states that she is noticing more abdominal strength. She is noticing that when she goes to shower, she is rubbing the  prolapse more than she used. She is going to talk to MD about pessary benefit at her next appointment.    SUBJECTIVE STATEMENT: Pt states that she has not been feeling well and is having a very heavy and painful menstrual cycle. She states that tampons are uncomfortable and she cannot perform pelvic floor contractions when she is using them on her menstrual cycle. She is very frustrated with lack of progress and having multiple medical appointments. She states that urgency is still about the same. She does not  that when tampons are in, she feels like the urgency is better. She is having less difficulty with getting to the bathroom. She is noticing improved core strength. She states that she does want to continue treatment.    Patient confirms identification and approves PT to assess pelvic floor and treatment Yes     PAIN:  Are you having pain? Yes - menstrual cycle pain   PRECAUTIONS: None       OBJECTIVE 08/24/21: pelvic floor strength 3/5, endurance 4-5 seconds, repetitions 6; anterior/posterior vaginal wall laxity grade 2 in supine only (pt preferred to only be supine and deferred other positions for pelvic floor exam).    08/17/21:    *Internal pelvic exam not performed today due to patient request since she is on her menstrual cycle. Limited exam findings due to late arrival.    GAIT: SPC in Rt hand for Rt antalgic gait pattern despite gait training and education on correct use   POSTURE:  Forward head, rounded shoulders   LUMBARAROM/PROM Grossly reduced by 50%     LE ROM:               Decreased B hip extension     LE MMT:               Functional weakness demonstrated with heavy leaning over single point cane with bil UE in order to rise from chair             No control of descent into chair             Bil compensated trendelenburg in standing/ambulation   PELVIC MMT:  Not assessed today           PALPATION: Not assessed today     PROLAPSE: Anterior/posterior vaginal wall laxity - not noted this session   TODAY'S TREATMENT 09/14/21: Neuromuscular re-education: Core facilitation: Bilateral seated march with 5lb weights on thighs - cues for decreasing posterior lean with lift and breath coordination, 2 x 10 Bil UE extension in seated (PT holding band) 2 x 10 with breath coordination and cues for core facilitation Anterior weight lift for core activation 3 lbs 2 x 10 Exercises: Strengthening: Seated step taps 2 x 40 Seated rows (PT holding band), green band, 2 x  10 Seated heel raises 5lbs bil thighs 3 x 10 Hamstring curls seated green band 10x bil   TREATMENT 08/31/21: Manual: Soft tissue mobilization: Scar tissue mobilization: Myofascial release: Spinal mobilization: Internal pelvic floor techniques: Dry needling: Neuromuscular re-education: Core retraining: transversus abdominus training in standing with use of table press to help facilitate appropriate contraction 15x with exhale Core facilitation: Bilateral seated march with 5lb weights on thighs - cues for decreasing posterior lean with lift and breath coordination, 2 x 10 Bil UE extension in seated (PT holding band) 2 x 10 with breath coordination and cues for core facilitation Form correction: Pelvic floor contraction training: Down training: Exercises: Stretches/mobility: Strengthening: Seated step taps  2 x 40 Seated rows (PT holding band), green band, 2 x 10 Seated heel raises Therapeutic activities: Functional strengthening activities: Self-care:    TREATMENT 08/24/21: Manual: Soft tissue mobilization: Scar tissue mobilization: Myofascial release: Spinal mobilization: Internal pelvic floor techniques: No emotional/communication barriers or cognitive limitation. Patient is motivated to learn. Patient understands and agrees with treatment goals and plan. PT explains patient will be examined in standing, sitting, and lying down to see how their muscles and joints work. When they are ready, they will be asked to remove their underwear so PT can examine their perineum. The patient is also given the option of providing their own chaperone as one is not provided in our facility. The patient also has the right and is explained the right to defer or refuse any part of the evaluation or treatment including the internal exam. With the patient's consent, PT will use one gloved finger to gently assess the muscles of the pelvic floor, seeing how well it contracts and relaxes and if there is  muscle symmetry. After, the patient will get dressed and PT and patient will discuss exam findings and plan of care. PT and patient discuss plan of care, schedule, attendance policy and HEP activities. Dry needling: Neuromuscular re-education: Core retraining:  Core facilitation: Form correction: Pelvic floor contraction training: Down training: Exercises: Stretches/mobility: Strengthening: Therapeutic activities: Functional strengthening activities: Bed mobility training with exhale/effort Self-care: Extensive education on pelvic floor anatomy and prolapse being probably cause of all symptoms/sensations Abdominal pressure management Review of pelvic floor strengthening program: 10 maximal quick flicks, 5 10 second long holds, 3x/day, all positions      PATIENT EDUCATION:  Education details: HEP updates Person educated: Patient Education method: Explanation, Demonstration, Tactile cues, Verbal cues, and Handouts Education comprehension: verbalized understanding     HOME EXERCISE PROGRAM: [id number]   ASSESSMENT:   CLINICAL IMPRESSION: We continued discussion about pessary being beneficial and speaking with MD about. We also reviewed pressure management with functional activities/movements to help make sure that she is not bearing down and increasing pressure on vaginal wall - exhale with effort. Believe her awareness of this is improving well, but she is limited by weakness. She is working on being more active in general and working on pelvic floor PT. She may continue to benefit from skilled PT intervention in order to address impairments, decrease urinary incontinence, and improve QOL.      OBJECTIVE IMPAIRMENTS Abnormal gait, decreased activity tolerance, decreased coordination, decreased endurance, decreased mobility, decreased ROM, decreased strength, hypomobility, increased fascial restrictions, increased muscle spasms, impaired tone, postural dysfunction, obesity, and pain.     ACTIVITY LIMITATIONS  ambulation, household chores, community activities, exercise .    PERSONAL FACTORS 3+ comorbidities: PCOS, prediabetic, sarcoidosis, chronic pain, obesity   are also affecting patient's functional outcome.      REHAB POTENTIAL: Fair due to comorbidities and inconsistent attendence   CLINICAL DECISION MAKING: Stable/uncomplicated   EVALUATION COMPLEXITY: Low     GOALS: Goals reviewed with patient? Yes   SHORT TERM GOALS: Target date: 09/14/2021   Pt will be independent with HEP.    Baseline: Goal status: MET   2.  Pt will be independent and provide teach back of urge suppression technique and the knack in order to help decrease urinary incontinence. Baseline: Pt still working to incorporate these techniques into lifestyle Goal status: IN PROGRESS     LONG TERM GOALS: Target date: 10/12/2021   Pt will be independent with advanced HEP.  Baseline: Pt working on progressing HEP.  Goal status: IN PROGRESS   2.  Pt will be able to correctly perform diaphragmatic breathing and appropriate pressure management in order to prevent worsening vaginal wall laxity and improve pelvic floor A/ROM. Baseline: Some increase in pressure management awareness Goal status: IN PROGRESS   3.  Pt will report no leaks with laughing, coughing, sneezing in order to improve comfort with interpersonal relationships and community activities. Baseline: Pt reports the most difficulty when getting up from a chair. Goal status: IN PROGRESS   4.  Pt will improve gait pattern and speed in order to be able to get to bathroom with increased ease, decreasing functional incontinence Baseline: Some progress with mobility and improving use of single point cane.  Goal status: IN PROGRESS   5.  Pt will be able to go 2 hours in between voids without urgency or incontinence in order to improve QOL and perform all functional activities with less difficulty. Baseline:  Goal status: IN  PROGRESS       PLAN: PT FREQUENCY: 1x/week   PT DURATION: 8 weeks   PLANNED INTERVENTIONS: Therapeutic exercises, Therapeutic activity, Neuromuscular re-education, Balance training, Gait training, Patient/Family education, Joint mobilization, Dry Needling, Biofeedback, and Manual therapy   PLAN FOR NEXT SESSION: Continue to progress concepts of pressure management with effort; progress functional strengthening with core and pelvic floor.    Heather Roberts, PT, DPT05/22/2311:44 AM

## 2021-09-23 ENCOUNTER — Ambulatory Visit: Payer: Medicaid Other

## 2021-09-23 DIAGNOSIS — M6281 Muscle weakness (generalized): Secondary | ICD-10-CM | POA: Diagnosis not present

## 2021-09-23 DIAGNOSIS — R293 Abnormal posture: Secondary | ICD-10-CM

## 2021-09-23 DIAGNOSIS — R279 Unspecified lack of coordination: Secondary | ICD-10-CM

## 2021-09-23 NOTE — Therapy (Signed)
OUTPATIENT PHYSICAL THERAPY TREATMENT NOTE   Patient Name: Diana Blair MRN: [id number] DOB:[birth year], 41 y.o., female Today's Date: 09/23/2021  PCP: Berkley Harvey, NP REFERRING PROVIDER: Jaquita Folds, MD  END OF SESSION:   PT End of Session - 09/23/21 0935     Visit Number 10    Date for PT Re-Evaluation 10/12/21    Authorization Type [employer] Medicaid    Authorization - Visit Number 64    PT Start Time 0933    PT Stop Time 5681    PT Time Calculation (min) 39 min    Activity Tolerance Patient tolerated treatment well    Behavior During Therapy WFL for tasks assessed/performed               Past Medical History:  Diagnosis Date   Acid reflux    Anemia    B12 deficiency    Bilateral swelling of feet    Chronic hypertension in obstetric context in first trimester 05/04/2016   Diverticulitis    Heart palpitations    High cholesterol    Hypertension    Morbid obesity with BMI of 50.0-59.9, adult (HCC)    BMI 58.8   PCOS (polycystic ovarian syndrome)    Prediabetes    Sarcoidosis    Seizure ([place])    as a child   Sleep apnea    SOB (shortness of breath)    Vitamin D deficiency    Past Surgical History:  Procedure Laterality Date   CERVICAL CERCLAGE  03/13/2012   Procedure: CERCLAGE CERVICAL;  Surgeon: Jonnie Kind, MD;  Location: [facility];  Service: Gynecology;  Laterality: N/A;   CERVICAL CERCLAGE  03/17/2012   Procedure: CERCLAGE CERVICAL;  Surgeon: Osborne Oman, MD;  Location: [facility];  Service: Gynecology;  Laterality: N/A;  Removal   COLONOSCOPY WITH PROPOFOL N/A 10/02/2020   Procedure: COLONOSCOPY WITH PROPOFOL;  Surgeon: Milus Banister, MD;  [facility];  Service: Endoscopy;  Laterality: N/A;   ESOPHAGOGASTRODUODENOSCOPY (EGD) WITH PROPOFOL N/A 10/02/2020   Procedure: ESOPHAGOGASTRODUODENOSCOPY (EGD) WITH PROPOFOL;  Surgeon: Milus Banister, MD;  [facility];  Service: Endoscopy;  Laterality: N/A;   POLYPECTOMY   10/02/2020   Procedure: POLYPECTOMY;  Surgeon: Milus Banister, MD;  [facility];  Service: Endoscopy;;   Patient Active Problem List   Diagnosis Date Noted   Urinary incontinence 04/02/2021   Abnormal uterine bleeding 03/25/2021   Generalized weakness 01/21/2021   Bilateral ankle pain 01/21/2021   Vitamin D deficiency 01/21/2021   Elevated CK 01/21/2021   Sleep apnea 12/09/2020   Allergic rhinitis 08/20/2020   Sarcoidosis 09/07/2019   Dysphagia 08/05/2019   Gastroesophageal reflux disease 03/20/2019   Pulmonary infiltrates on CXR 02/26/2019   Upper airway cough syndrome 02/26/2019   Advanced maternal age in multigravida 11/10/2016   Chronic hypertension in pregnancy 11/10/2016   IUGR (intrauterine growth restriction) affecting care of mother, third trimester, other fetus 11/05/2016   BMI 60.0-69.9, adult ([place]) 05/04/2016   History of preterm delivery 08/20/2015   History of cervical cerclage 08/20/2015   PCOS (polycystic ovarian syndrome) 02/08/2013    REFERRING DIAG: N81.10 (ICD-10-CM) - Prolapse of anterior vaginal wall N81.6 (ICD-10-CM) - Prolapse of posterior vaginal wall N32.81 (ICD-10-CM) - Overactive bladder   THERAPY DIAG:  Muscle weakness (generalized)  Abnormal posture  Unspecified lack of coordination  PERTINENT HISTORY: PCOS, prediabetic   PRECAUTIONS: NA  SUBJECTIVE: Patient states that she continues to feel stronger in her core. However, she  is on menstrual cycle and feels like the issue with urine getting stuck behind tampon is worse. When she sits down to urinate, she states that tampon expels itself and then large amount of urinae will come out as well. She has some doctor's appointments coming up and would like to focus on those; she also states that it will be very hard to get here with her daughter out of school for the summer. Due to this, she would like to go ahead and discharge today with plan to return in the fall when daughter goes back to  school.    SUBJECTIVE STATEMENT: Pt states that she has not been feeling well and is having a very heavy and painful menstrual cycle. She states that tampons are uncomfortable and she cannot perform pelvic floor contractions when she is using them on her menstrual cycle. She is very frustrated with lack of progress and having multiple medical appointments. She states that urgency is still about the same. She does not that when tampons are in, she feels like the urgency is better. She is having less difficulty with getting to the bathroom. She is noticing improved core strength. She states that she does want to continue treatment.    Patient confirms identification and approves PT to assess pelvic floor and treatment Yes     PAIN:  Are you having pain? Yes - menstrual cycle pain   PRECAUTIONS: None       OBJECTIVE 08/24/21: pelvic floor strength 3/5, endurance 4-5 seconds, repetitions 6; anterior/posterior vaginal wall laxity grade 2 in supine only (pt preferred to only be supine and deferred other positions for pelvic floor exam).    08/17/21:    *Internal pelvic exam not performed today due to patient request since she is on her menstrual cycle. Limited exam findings due to late arrival.    GAIT: SPC in Rt hand for Rt antalgic gait pattern despite gait training and education on correct use   POSTURE:  Forward head, rounded shoulders   LUMBARAROM/PROM Grossly reduced by 50%     LE ROM:               Decreased B hip extension     LE MMT:               Functional weakness demonstrated with heavy leaning over single point cane with bil UE in order to rise from chair             No control of descent into chair             Bil compensated trendelenburg in standing/ambulation   PELVIC MMT:  Not assessed today           PALPATION: Not assessed today     PROLAPSE: Anterior/posterior vaginal wall laxity - not noted this session   TODAY'S TREATMENT  09/23/21: Self-care: Pressure management HEP continuation D/C today Talking with MD about pessary Anatomy education of what could be causing leaking issue   TREATMENT 09/14/21: Neuromuscular re-education: Core facilitation: Bilateral seated march with 5lb weights on thighs - cues for decreasing posterior lean with lift and breath coordination, 2 x 10 Bil UE extension in seated (PT holding band) 2 x 10 with breath coordination and cues for core facilitation Anterior weight lift for core activation 3 lbs 2 x 10 Exercises: Strengthening: Seated step taps 2 x 40 Seated rows (PT holding band), green band, 2 x 10 Seated heel raises 5lbs bil thighs 3 x 10 Hamstring  curls seated green band 10x bil   TREATMENT 08/31/21: Manual: Soft tissue mobilization: Scar tissue mobilization: Myofascial release: Spinal mobilization: Internal pelvic floor techniques: Dry needling: Neuromuscular re-education: Core retraining: transversus abdominus training in standing with use of table press to help facilitate appropriate contraction 15x with exhale Core facilitation: Bilateral seated march with 5lb weights on thighs - cues for decreasing posterior lean with lift and breath coordination, 2 x 10 Bil UE extension in seated (PT holding band) 2 x 10 with breath coordination and cues for core facilitation Form correction: Pelvic floor contraction training: Down training: Exercises: Stretches/mobility: Strengthening: Seated step taps 2 x 40 Seated rows (PT holding band), green band, 2 x 10 Seated heel raises Therapeutic activities: Functional strengthening activities: Self-care:       PATIENT EDUCATION:  Education details: Pressure management, HEP moving forward Person educated: Patient Education method: Explanation, Demonstration, Tactile cues, Verbal cues, and Handouts Education comprehension: verbalized understanding     HOME EXERCISE PROGRAM: [id number]   ASSESSMENT:   CLINICAL  IMPRESSION: Patient has made good progress with ability to progress core strengthening and understanding of pressure management. However, she is still having difficulty implementing concepts of good intra-abdominal pressure management with actual activity. Believe this is contributing to continuation of urinary incontinence. She does have descent pelvic floor strength and motor control, but lacks the ability to utilize in a way that supports anterior/posterior vaginal walls with activity. Due to this, believe that she would benefit from a pessary. We have discussed poise impressa, but due to back pain and habitus, patient does not feel like this is a good option her her at this point; she is interested in talking with MD about options regarding pessary. She may continue to benefit from skilled PT intervention in order to address impairments, decrease urinary incontinence, and improve QOL.      OBJECTIVE IMPAIRMENTS Abnormal gait, decreased activity tolerance, decreased coordination, decreased endurance, decreased mobility, decreased ROM, decreased strength, hypomobility, increased fascial restrictions, increased muscle spasms, impaired tone, postural dysfunction, obesity, and pain.    ACTIVITY LIMITATIONS  ambulation, household chores, community activities, exercise .    PERSONAL FACTORS 3+ comorbidities: PCOS, prediabetic, sarcoidosis, chronic pain, obesity   are also affecting patient's functional outcome.      REHAB POTENTIAL: Fair due to comorbidities and inconsistent attendence   CLINICAL DECISION MAKING: Stable/uncomplicated   EVALUATION COMPLEXITY: Low     GOALS: Goals reviewed with patient? Yes   SHORT TERM GOALS: Target date: 09/14/2021   Pt will be independent with HEP.    Baseline: Goal status: MET   2.  Pt will be independent and provide teach back of urge suppression technique and the knack in order to help decrease urinary incontinence. Baseline: Pt still working to  incorporate these techniques into lifestyle Goal status: MET     LONG TERM GOALS: Target date: 10/12/2021   Pt will be independent with advanced HEP.    Baseline: Pt working on progressing HEP.  Goal status: IN PROGRESS   2.  Pt will be able to correctly perform diaphragmatic breathing and appropriate pressure management in order to prevent worsening vaginal wall laxity and improve pelvic floor A/ROM. Baseline: Some increase in pressure management awareness - still poor coordination of pelvic floor support during activities with good pressure management Goal status: IN PROGRESS   3.  Pt will report no leaks with laughing, coughing, sneezing in order to improve comfort with interpersonal relationships and community activities. Baseline: Pt reports the most difficulty when  getting up from a chair. Goal status: MET   4.  Pt will improve gait pattern and speed in order to be able to get to bathroom with increased ease, decreasing functional incontinence Baseline: Some progress with mobility and improving use of single point cane.  Goal status: IN PROGRESS   5.  Pt will be able to go 2 hours in between voids without urgency or incontinence in order to improve QOL and perform all functional activities with less difficulty. Baseline: Patient is going more frequently due to trying to avoid leaking - discussed that this can have unfortunate consequences. Goal status: IN PROGRESS       PLAN: PT FREQUENCY: 1x/week   PT DURATION: 8 weeks   PLANNED INTERVENTIONS: Therapeutic exercises, Therapeutic activity, Neuromuscular re-education, Balance training, Gait training, Patient/Family education, Joint mobilization, Dry Needling, Biofeedback, and Manual therapy   PLAN FOR NEXT SESSION: D/C  PHYSICAL THERAPY DISCHARGE SUMMARY  Visits from Start of Care: 10  Current functional level related to goals / functional outcomes: Incomplete   Remaining deficits: See above   Education /  Equipment: HEP   Patient agrees to discharge. Patient goals were partially met. Patient is being discharged due to the patient's request.  Heather Roberts, PT, DPT05/31/2310:16 AM

## 2021-09-25 ENCOUNTER — Telehealth: Payer: Self-pay | Admitting: Obstetrics and Gynecology

## 2021-09-25 ENCOUNTER — Other Ambulatory Visit: Payer: Medicaid Other | Admitting: Obstetrics and Gynecology

## 2021-09-25 NOTE — Telephone Encounter (Signed)
Pt sent message through [facility] regarding scheduling appts.

## 2021-11-13 ENCOUNTER — Other Ambulatory Visit: Payer: Medicaid Other | Admitting: Obstetrics and Gynecology

## 2021-11-19 ENCOUNTER — Telehealth: Payer: Self-pay | Admitting: Emergency Medicine

## 2021-11-19 NOTE — Telephone Encounter (Signed)
Called and spoke with patient. Patient states that her cough hasn't gotten better and she can't get cough under control. Patient wants to know if she can have prednisone sent in for her cough.   Patient also wanted to know if it was okay for her to take the enzyme complex medicine and the pantoprazole together.   RB, please advise.

## 2021-11-19 NOTE — Telephone Encounter (Signed)
Haven't seen her since February - she needs an acute OV with APP or RB w a CXR given her hx sarcoidosis

## 2021-11-20 ENCOUNTER — Telehealth: Payer: Self-pay | Admitting: Emergency Medicine

## 2021-11-20 NOTE — Telephone Encounter (Signed)
Called and spoke with patient. Per DPR, left detailed vm letting patient know that she would need an acute visit set up with a chest xray with Dr. Lamonte Sakai or one of our APP's. Left phone number on vm for patient to call us back and get her appointment set up.   Nothing further needed at this time.

## 2021-11-20 NOTE — Telephone Encounter (Signed)
Called patient and she states that the cough is due to her stopping the acid reflux medication in the past. And she states with her restarting the acid reflux medication is not helping her cough. She states she normally gets prednisone to help her with her cough. She is not wanting to wait until her follow up appointment to help with her cough  Please advise sir

## 2021-11-20 NOTE — Telephone Encounter (Signed)
Give her prednisone '20mg'$  qd x 5 days and tell her to get back on the GERD medication. Thanks

## 2021-11-23 MED ORDER — PREDNISONE 10 MG PO TABS: 20.0000 mg | ORAL_TABLET | Freq: Every day | ORAL | 0 refills | Status: AC

## 2021-11-23 NOTE — Telephone Encounter (Signed)
Called and left voicemail for patient to call office back in regards to medication  

## 2021-11-23 NOTE — Telephone Encounter (Signed)
Called patient and went over prednisone dose and medication with her. Patient verbalized medication and understanding. Nothing further needed

## 2021-11-25 ENCOUNTER — Telehealth: Payer: Self-pay | Admitting: Emergency Medicine

## 2021-11-25 NOTE — Telephone Encounter (Signed)
Spoke with the pt  She is c/o continued cough, not improving  She is scheduled with Katie for 12/02/21 and requested sooner visit  Pt scheduled with Dr Lake Bells for 9 am tomorrow am Cancelled appt with Joellen Jersey

## 2021-11-26 ENCOUNTER — Ambulatory Visit (INDEPENDENT_AMBULATORY_CARE_PROVIDER_SITE_OTHER): Payer: Medicaid Other | Admitting: Pulmonary Disease

## 2021-11-26 ENCOUNTER — Encounter: Payer: Self-pay | Admitting: Pulmonary Disease

## 2021-11-26 ENCOUNTER — Ambulatory Visit (INDEPENDENT_AMBULATORY_CARE_PROVIDER_SITE_OTHER): Payer: Medicaid Other

## 2021-11-26 VITALS — BP 150/84 | HR 116 | Temp 99.0°F | Ht 63.0 in | Wt 391.0 lb

## 2021-11-26 DIAGNOSIS — R058 Other specified cough: Secondary | ICD-10-CM | POA: Diagnosis not present

## 2021-11-26 DIAGNOSIS — R918 Other nonspecific abnormal finding of lung field: Secondary | ICD-10-CM

## 2021-11-26 DIAGNOSIS — K219 Gastro-esophageal reflux disease without esophagitis: Secondary | ICD-10-CM | POA: Diagnosis not present

## 2021-11-26 MED ORDER — PREDNISONE 10 MG PO TABS: ORAL_TABLET | ORAL | 0 refills | Status: DC

## 2021-11-26 MED ORDER — AZITHROMYCIN 250 MG PO TABS: ORAL_TABLET | ORAL | 0 refills | Status: DC

## 2021-11-26 NOTE — Patient Instructions (Signed)
Upper airway cough with acute exacerbation I will adjust the prednisone dosing to 40 mg daily x2 days then 30 mg daily x2 days then 20 mg daily x2 days then 10 mg daily x2 days then off Keep taking Delsym over-the-counter as you are doing You need to try to suppress your cough to allow your larynx (voice box) to heal.  For three days don't talk, laugh, sing, or clear your throat. Do everything you can to suppress the cough during this time. Use hard candies (sugarless Jolly Ranchers) or non-mint or non-menthol containing cough drops during this time to soothe your throat.  Use a cough suppressant (Delsym or what I have prescribed you) around the clock during this time.  After three days, gradually increase the use of your voice and back off on the cough suppressants. Chest x-ray today I will prescribe a Z-Pak, as discussed today in clinic do not take this unless the mucus production worsens or changes color over the weekend.  You should also take it if you develop a fever or shortness of breath  Gastroesophageal reflux disease: Continue taking Protonix and Pepcid as prescribed  Follow-up with Dr. Lamonte Sakai in 1 to 2 months

## 2021-11-26 NOTE — Progress Notes (Signed)
Synopsis: Followed by Dr. Lamonte Sakai for persistent pulmonary infiltrates thought to be possible sarcoidosis and upper airway cough syndrome.    Subjective:   PATIENT ID: Diana Blair GENDER: female DOB: 21-Apr-1981, MRN: [id number]   HPI  Chief Complaint  Patient presents with   Acute Visit    Increased cough past several wks- rarely prod with min clear to white sputum. Pred has helped some- taking 20 mg per day.  She has some wheezing and SOB with exertion.     Cough: > she said she went off her protonix and pepcid because she was trying papaya enzyme supplements > she said initially this helped but eventually it stopped > this about 3-4 weeks ago > never has heartburn, but she knows things are acting up if she has a tickle in her throat> this started a few weeks later > she called our office and she was given some prednisone but she says it's not working as well as it usually does > she says that the cough is now starting to become productive > she says that she typically takes higher doses of prednisone that helps.  > she started the reflux medicine    Record review: Records from her visit with Dr. Lamonte Sakai in April 2022 reviewed where she was treated for upper airway cough syndrome with antacid therapy  Past Medical History:  Diagnosis Date   Acid reflux    Anemia    B12 deficiency    Bilateral swelling of feet    Chronic hypertension in obstetric context in first trimester 05/04/2016   Diverticulitis    Heart palpitations    High cholesterol    Hypertension    Morbid obesity with BMI of 50.0-59.9, adult (HCC)    BMI 58.8   PCOS (polycystic ovarian syndrome)    Prediabetes    Sarcoidosis    Seizure ([place])    as a child   Sleep apnea    SOB (shortness of breath)    Vitamin D deficiency      Family History  Problem Relation Age of Onset   Cancer Mother        lung   Hypertension Mother    Hyperlipidemia Father    Hypertension Father    Diabetes Father     Heart failure Father    Hypertension Sister    Eczema Sister    Hypertension Brother    Diabetes Paternal Grandmother    Other Neg Hx    Colon cancer Neg Hx    Pancreatic cancer Neg Hx    Esophageal cancer Neg Hx      Social History   Socioeconomic History   Marital status: Legally Separated    Spouse name: Not on file   Number of children: 1   Years of education: Not on file   Highest education level: Not on file  Occupational History   Occupation: student  Tobacco Use   Smoking status: Former    Packs/day: 1.50    Years: 6.00    Total pack years: 9.00    Types: Cigarettes    Quit date: 02/08/2009    Years since quitting: 12.8   Smokeless tobacco: Never   Tobacco comments:    Smoked off/on x 6 years  Vaping Use   Vaping Use: Never used  Substance and Sexual Activity   Alcohol use: No   Drug use: No   Sexual activity: Not Currently    Birth control/protection: None  Other Topics Concern  Not on file  Social History Narrative   Caffeine 2 soda in month. Sweet tea.  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  Single parent.    Social Determinants of Health   Financial Resource Strain: Not on file  Food Insecurity: No Food Insecurity (01/23/2020)   Hunger Vital Sign    Worried About Running Out of Food in the Last Year: Never true    Ran Out of Food in the Last Year: Never true  Transportation Needs: Not on file  Physical Activity: Not on file  Stress: Not on file  Social Connections: Not on file  Intimate Partner Violence: Not on file     No Known Allergies   Outpatient Medications Prior to Visit  Medication Sig Dispense Refill   Biotin w/ Vitamins C & E (HAIR/SKIN/NAILS) 1250-7.5-7.5 MCG-MG-UNT CHEW Chew by mouth.     cetirizine (ZYRTEC) 10 MG tablet Take 10 mg by mouth daily.     Cholecalciferol (D3-1000) 25 MCG (1000 UT) capsule Take 1,000 Units by mouth daily.     famotidine (PEPCID) 20 MG tablet TAKE 1  TABLET(20 MG) BY MOUTH AT BEDTIME 90 tablet 3   Ferrous Sulfate Dried (HIGH POTENCY IRON) 65 MG TABS Take by mouth.     ibuprofen (ADVIL) 600 MG tablet Take 1 tablet (600 mg total) by mouth every 8 (eight) hours as needed for up to 30 doses for fever, headache, mild pain or moderate pain (Inflammation). Take 1 tablet 3 times daily as needed for inflammation of upper airways and/or pain. 30 tablet 0   methylcellulose (CITRUCEL) oral powder Take 1 packet by mouth daily.     pantoprazole (PROTONIX) 40 MG tablet TAKE 1 TABLET(40 MG) BY MOUTH DAILY 90 tablet 3   predniSONE (DELTASONE) 10 MG tablet Take 2 tablets (20 mg total) by mouth daily with breakfast for 5 days. 10 tablet 0   pyridOXINE (VITAMIN B-6) 100 MG tablet Take 100 mg by mouth daily.     vitamin B-12 (CYANOCOBALAMIN) 1000 MCG tablet Take 1,000 mcg by mouth daily.     fluticasone (FLONASE) 50 MCG/ACT nasal spray Place 2 sprays into both nostrils daily. 16 g 11   No facility-administered medications prior to visit.    Review of Systems  Constitutional: Negative.  Negative for chills, fever and malaise/fatigue.  HENT: Negative.  Negative for congestion, sinus pain and sore throat.       Objective:  Physical Exam   Vitals:   11/26/21 0916  BP: (!) 150/84  Pulse: (!) 116  Temp: 99 F (37.2 C)  TempSrc: Oral  SpO2: 98%  Weight: (!) 391 lb (177.4 kg)  Height: '5\' 3"'$  (1.6 m)    General:  Morbidly obese, resting comfortably in bed HENT: NCAT OP clear PULM: CTA B, normal effort CV: RRR, no mgr GI: BS+, soft, nontender MSK: normal bulk and tone Neuro: awake, alert, no distress, MAEW   CBC    Component Value Date/Time   WBC 7.2 03/25/2021 1109   RBC 3.72 (L) 03/25/2021 1109   HGB 9.0 (L) 03/25/2021 1109   HGB 12.2 12/31/2020 1047   HGB 12.9 02/08/2017 1524   HCT 30.2 (L) 03/25/2021 1109   HCT 38.0 02/08/2017 1524   PLT 330 03/25/2021 1109   PLT 286 12/31/2020 1047   PLT 253 02/08/2017 1524   MCV 81.2 03/25/2021  1109   MCV 82 02/08/2017 1524   MCH 24.2 (L) 03/25/2021 1109   MCHC 29.8 (L) 03/25/2021  1109   RDW 17.0 (H) 03/25/2021 1109   RDW 16.3 (H) 02/08/2017 1524   LYMPHSABS 907 03/25/2021 1109   MONOABS 0.7 02/09/2021 1246   EOSABS 130 03/25/2021 1109   BASOSABS 22 03/25/2021 1109     Chest imaging: February 2023 CT chest images independently reviewed showing a nodular and linear infiltrate bilaterally, no adenopathy  PFT:  Labs:  Path:  Echo:  Heart Catheterization:       Assessment & Plan:   Upper airway cough syndrome - Plan: DG Chest 2 View  Pulmonary infiltrates  Gastroesophageal reflux disease, unspecified whether esophagitis present  Discussion: 40 year old female with upper airway cough syndrome and poorly defined infiltrates which may be burnt out sarcoidosis presents with recurrent cough, acutely exacerbated compared to baseline.  I agree with her assessment that this is most likely related to stopping her antacid therapy.  However, because of these infiltrates she has she has been treated successfully with these episodes with prednisone in the past.  She is here to see me today because the dose of prednisone she is taking is lower than typical and is not helping, she is also noted some increase in mucus production.  At this time she does not seem to be infected.  I think this is all related to the upper airway cough syndrome however we need to get a chest x-ray just to make sure these infiltrates are getting worse.  Plan: Upper airway cough with acute exacerbation I will adjust the prednisone dosing to 40 mg daily x2 days then 30 mg daily x2 days then 20 mg daily x2 days then 10 mg daily x2 days then off Keep taking Delsym over-the-counter as you are doing You need to try to suppress your cough to allow your larynx (voice box) to heal.  For three days don't talk, laugh, sing, or clear your throat. Do everything you can to suppress the cough during this time. Use hard  candies (sugarless Jolly Ranchers) or non-mint or non-menthol containing cough drops during this time to soothe your throat.  Use a cough suppressant (Delsym or what I have prescribed you) around the clock during this time.  After three days, gradually increase the use of your voice and back off on the cough suppressants. Chest x-ray today I will prescribe a Z-Pak, as discussed today in clinic do not take this unless the mucus production worsens or changes color over the weekend.  You should also take it if you develop a fever or shortness of breath  Gastroesophageal reflux disease: Continue taking Protonix and Pepcid as prescribed  Pulmonary infiltrates, possible sarcoid CT chest earlier this year showed these had improved We will get a chest x-ray today to make sure the cough is not related to that process If worsening then consider bronchoscopy  Follow-up with Dr. Lamonte Sakai in 1 to 2 months    Current Outpatient Medications:    azithromycin (ZITHROMAX Z-PAK) 250 MG tablet, Take as directed, Disp: 6 each, Rfl: 0   Biotin w/ Vitamins C & E (HAIR/SKIN/NAILS) 1250-7.5-7.5 MCG-MG-UNT CHEW, Chew by mouth., Disp: , Rfl:    cetirizine (ZYRTEC) 10 MG tablet, Take 10 mg by mouth daily., Disp: , Rfl:    Cholecalciferol (D3-1000) 25 MCG (1000 UT) capsule, Take 1,000 Units by mouth daily., Disp: , Rfl:    famotidine (PEPCID) 20 MG tablet, TAKE 1 TABLET(20 MG) BY MOUTH AT BEDTIME, Disp: 90 tablet, Rfl: 3   Ferrous Sulfate Dried (HIGH POTENCY IRON) 65 MG TABS, Take by  mouth., Disp: , Rfl:    ibuprofen (ADVIL) 600 MG tablet, Take 1 tablet (600 mg total) by mouth every 8 (eight) hours as needed for up to 30 doses for fever, headache, mild pain or moderate pain (Inflammation). Take 1 tablet 3 times daily as needed for inflammation of upper airways and/or pain., Disp: 30 tablet, Rfl: 0   methylcellulose (CITRUCEL) oral powder, Take 1 packet by mouth daily., Disp: , Rfl:    pantoprazole (PROTONIX) 40 MG tablet,  TAKE 1 TABLET(40 MG) BY MOUTH DAILY, Disp: 90 tablet, Rfl: 3   predniSONE (DELTASONE) 10 MG tablet, Take 2 tablets (20 mg total) by mouth daily with breakfast for 5 days., Disp: 10 tablet, Rfl: 0   predniSONE (DELTASONE) 10 MG tablet, 4 x 2 days, 3 x 2 days, 2 x 2 days, 1 x 1 days then stop, Disp: 20 tablet, Rfl: 0   pyridOXINE (VITAMIN B-6) 100 MG tablet, Take 100 mg by mouth daily., Disp: , Rfl:    vitamin B-12 (CYANOCOBALAMIN) 1000 MCG tablet, Take 1,000 mcg by mouth daily., Disp: , Rfl:

## 2021-11-27 ENCOUNTER — Telehealth: Payer: Self-pay | Admitting: Pulmonary Disease

## 2021-11-27 MED ORDER — PROMETHAZINE-DM 6.25-15 MG/5ML PO SYRP: 5.0000 mL | ORAL_SOLUTION | Freq: Four times a day (QID) | ORAL | 0 refills | Status: DC | PRN

## 2021-11-27 NOTE — Telephone Encounter (Signed)
[facility] On-Call Overnight  Ms. Twichell is a 41 year old female recently seen in clinic for acute on chronic cough. She has been taking prednisone with breakthrough cough which is now more productive. Was prescribed azithromycin and asking if ok to take at this point. Also requesting stronger cough syrup.  --Recommend starting azithromycin --Rx promethazine-DM cough syrup

## 2021-11-27 NOTE — Progress Notes (Signed)
Called and left detailed msg machine with results ok per dpr

## 2021-12-02 ENCOUNTER — Ambulatory Visit: Payer: Medicaid Other | Admitting: Nurse Practitioner

## 2021-12-02 ENCOUNTER — Encounter (INDEPENDENT_AMBULATORY_CARE_PROVIDER_SITE_OTHER): Payer: Self-pay

## 2021-12-03 ENCOUNTER — Telehealth: Payer: Self-pay | Admitting: Emergency Medicine

## 2021-12-03 NOTE — Telephone Encounter (Signed)
Patient is returning phone call. Patient phone number is [phone].

## 2021-12-03 NOTE — Telephone Encounter (Signed)
Attempted to call pt but unable to reach. Left message for her to return call. 

## 2021-12-03 NOTE — Telephone Encounter (Signed)
Called and spoke with pt letting her know recs per RB and she verbalized understanding. Appt scheduled for pt tomorrow 8/11 with MW. Nothing further needed.

## 2021-12-03 NOTE — Telephone Encounter (Signed)
Called and spoke with pt who states she finished the abx 8/9 and states that she took the last of the taper pack of prednisone today 8/10. States that she is still coughing and still has some SOB. Pt said the cough is better compared to how she was at [facility] with BQ 8/3.  Pt said the more she talks, the more she will cough and will also begin to wheeze. Pt said when she coughs, she usually does not cough up any phlegm but if she does, she will cough up some phlegm due to her reflux.  Due to pt's symptoms, pt wants to know if more meds can be sent in to further help with her symptoms as she wants to be able to feel like herself again.   Dr. Lamonte Sakai, please advise.

## 2021-12-03 NOTE — Telephone Encounter (Signed)
If she still has symptoms following antibiotics and steroids unclear to me what medications to add at this time.  I think she needs to be seen again in the office for an acute visit to troubleshoot

## 2021-12-04 ENCOUNTER — Ambulatory Visit: Payer: Medicaid Other | Admitting: Internal Medicine

## 2021-12-04 ENCOUNTER — Telehealth: Payer: Self-pay | Admitting: Pulmonary Disease

## 2021-12-04 NOTE — Progress Notes (Deleted)
Diana Blair, female    DOB: 1980-07-17    MRN: [id number]   Brief patient profile:  12  yobf  Quit smoking 2010  Last IUP 2018  ? Allergy as  Child (does not recall details or response to clariton but "eventually stopped taking it s flare and never took shots)  and  And dx as teenage as freq "bronchitis" and pattern continued as adult  up to as often as once  yearly and no change p quit smoking. Has esp severe pattern since 2019  assoc with nasal congestion and similar onset flare   Aug 2020   failure to respond to 3 different abx for "pna/ bronchitis" so referred to pulmonary clinic 02/26/2019 by Dr   Dolly Rias.  Globus sensation comes and goes since 2010 worse with exp to smells   November 12 2016  IUP  And w/in 6-8  Months dx " pna didn't get better"  History of Present Illness  02/26/2019  Pulmonary/ 1st office eval/Diana Blair  Chief Complaint  Patient presents with   Pulmonary Consult    Referred by Dr Rivka Barbara for eval of PNA.  Pt states that she has had PNA since Aug 2020 and is on her 3rd round of abx. She c/o SOB. She has some cough at night- prod with clear sputum.   Dyspnea:  MMRC3 = can't walk 100 yards even at a slow pace at a flat grade s stopping due to sob Cough: evenings are the worst with sensaton of globus but no excess mucus   Sleep: flat in bed / best she feels best first thing in am and denies noct cough  SABA use: not using  No fever, no localized or pleuritic cp. Some assoc migrating arthralgias  Sensation of mucus stuck in chest  > was told this was fluid so is convinced this is correct.  rec For cough or sensation throat / chest congestion  >  delsym 2 tsp every 12 hours as needed GERD  Diet  Prednisone 10 mg take  4 each am x 2 days,   2 each am x 2 days,  1 each am x 2 days and stop  Please remember to go to the lab department   for your tests - we will call you with the results when they are available. Please schedule a follow up office visit in 2 weeks, sooner if  needed  with all medications /inhalers/ solutions in hand so we can verify exactly what you are taking. This includes all medications from all doctors and over the counters   03/01/2019  f/u ov/Diana Blair re:  Cough/ sob/ pulmonary infiltrates  Chief Complaint  Patient presents with   Follow-up    Pt is here today to discuss the results of her labwork from last visit. Pt says that she has been doing well since last visit.  Dyspnea:  Better  >> Playing with toddler / less sob car to door today vs prior ov and parked further out  Cough: less sense of globus, food no longer  Feels as  Stuck/ no mucus  Sleeping: still feels good in am s am cough or noct resp symtpoms  SABA use: none 02: none  Change prednisone to 10 mg one daily until done  Please schedule a follow up office visit in 4 weeks, sooner if needed for CXR return > did not do as improved from November to March    08/03/19 NP   Recommendation: - Make sure to take  Protonix twice daily 1 hour prior or after meals - Take famotidine at bedtime - Follow GERD diet  Orders: - Modified barium swallow re: globus sensation /dysphagia> done 10/04/19 nl  - CXR Slowly worsening infiltrates since 2019, not definitely scarring on the CT scan from February 21, 2019. Recommend pulmonary consultation with consideration of bronchoscopy for diagnosis. - Labs- Ace 145   08/09/19  televist rec Best way to take protonix is Take 30- 60 min before your first and last meals of the day and pepcid at bedtime  GERD diet  Please schedule a follow up office visit in 2 weeks, sooner if needed  with all medications /inhalers/ solutions in hand so we can verify exactly what you are taking. This includes all medications from all doctors and over the counters      09/03/2019  f/u ov/Diana Blair re:   Has not had covid shots or covid 19 / was much better p prednisone rx  - benefit lasted sev months including arthritis symptoms Chief Complaint  Patient presents with    Follow-up    1 month f/u. Increased fatigue, all day.   Dyspnea: mostly with ex Cough: assoc with globus, esp with activity Sleeping: elevating at 30 egrees SABA use: none 02: none  rec We will call with ENT referral In meantime if getting worse Prednisone 10 mg x 2 daily until better then 1 daily x one week and stop Please schedule a follow up office visit in 6 weeks, call sooner if needed with cxr on return    11/26/2019  f/u ov/Diana Blair re:  ? Sarcoid/ has not had covid vaccinations Chief Complaint  Patient presents with   Follow-up    Dyspena on exertion. Airway is irriated by allergies.  Dyspnea:  No change doe - all other symptoms come and go s pattern, lasting hours to days then resolve for weeks s specific rx  Cough: none  Sleeping: mostly on L side down  SABA use: none  02: none  Cp= L posterior below scapula = when it comes on is constant x sev x per times month and last all day and no change with deep breathing or walking  Rec No change rx  [facility] recs   11/26/21  Upper airway cough with acute exacerbation I will adjust the prednisone dosing to 40 mg daily x2 days then 30 mg daily x2 days then 20 mg daily x2 days then 10 mg daily x2 days then off Keep taking Delsym over-the-counter as you are doing You need to try to suppress your cough to allow your larynx (voice box) to heal.  For three days don't talk, laugh, sing, or clear your throat. Do everything you can to suppress the cough during this time. Use hard candies (sugarless Jolly Ranchers) or non-mint or non-menthol containing cough drops during this time to soothe your throat.  Use a cough suppressant (Delsym or what I have prescribed you) around the clock during this time.  After three days, gradually increase the use of your voice and back off on the cough suppressants. Chest x-ray today I will prescribe a Z-Pak, as discussed today in clinic do not take this unless the mucus production worsens or changes color over the  weekend.  You should also take it if you develop a fever or shortness of breath Gastroesophageal reflux disease: Continue taking Protonix and Pepcid as prescribed  Follow-up with Dr. Lamonte Sakai in 1 to 2 months  12/04/2021   Acute  ov/Diana Blair re: ***  maint on ***  No chief complaint on file.   Dyspnea:  *** Cough: *** Sleeping: *** SABA use: *** 02: *** Covid status:   ***   No obvious day to day or daytime variability or assoc excess/ purulent sputum or mucus plugs or hemoptysis or cp or chest tightness, subjective wheeze or overt sinus or hb symptoms.   *** without nocturnal  or early am exacerbation  of respiratory  c/o's or need for noct saba. Also denies any obvious fluctuation of symptoms with weather or environmental changes or other aggravating or alleviating factors except as outlined above   No unusual exposure hx or h/o childhood pna/ asthma or knowledge of premature birth.  Current Allergies, Complete Past Medical History, Past Surgical History, Family History, and Social History were reviewed in [employer] record.  ROS  The following are not active complaints unless bolded Hoarseness, sore throat, dysphagia, dental problems, itching, sneezing,  nasal congestion or discharge of excess mucus or purulent secretions, ear ache,   fever, chills, sweats, unintended wt loss or wt gain, classically pleuritic or exertional cp,  orthopnea pnd or arm/hand swelling  or leg swelling, presyncope, palpitations, abdominal pain, anorexia, nausea, vomiting, diarrhea  or change in bowel habits or change in bladder habits, change in stools or change in urine, dysuria, hematuria,  rash, arthralgias, visual complaints, headache, numbness, weakness or ataxia or problems with walking or coordination,  change in mood or  memory.        No outpatient medications have been marked as taking for the 12/04/21 encounter (Appointment) with Tanda Rockers, MD.                               Past Medical History:  Diagnosis Date   Chronic hypertension in obstetric context in first trimester 05/04/2016   Diverticulitis    Heart palpitations    Morbid obesity with BMI of 50.0-59.9, adult ([place])    BMI 58.8   PCOS (polycystic ovarian syndrome)    Seizure ([place])    as a child      Objective:    Wts  12/04/2021            ***  11/26/2019             399  09/03/2019            390     03/01/19 (!) 389 lb 3.2 oz (176.5 kg)  02/26/19 (!) 390 lb (176.9 kg)  02/19/19 (!) 495 lb (224.5 kg)            Assessment

## 2021-12-11 ENCOUNTER — Encounter: Payer: Self-pay | Admitting: *Deleted

## 2021-12-11 ENCOUNTER — Ambulatory Visit (INDEPENDENT_AMBULATORY_CARE_PROVIDER_SITE_OTHER): Payer: Medicaid Other | Admitting: Pulmonary Disease

## 2021-12-11 ENCOUNTER — Encounter: Payer: Self-pay | Admitting: Pulmonary Disease

## 2021-12-11 VITALS — BP 134/74 | HR 97 | Temp 99.9°F | Ht 63.0 in | Wt 388.0 lb

## 2021-12-11 DIAGNOSIS — R058 Other specified cough: Secondary | ICD-10-CM

## 2021-12-11 DIAGNOSIS — K219 Gastro-esophageal reflux disease without esophagitis: Secondary | ICD-10-CM

## 2021-12-11 MED ORDER — CHLORPHENIRAMINE MALEATE 4 MG PO TABS: 4.0000 mg | ORAL_TABLET | Freq: Three times a day (TID) | ORAL | 0 refills | Status: DC

## 2021-12-11 MED ORDER — DEXTROMETHORPHAN POLISTIREX ER 30 MG/5ML PO SUER: 30.0000 mg | Freq: Two times a day (BID) | ORAL | 1 refills | Status: AC | PRN

## 2021-12-11 MED ORDER — CETIRIZINE HCL 10 MG PO TABS: 10.0000 mg | ORAL_TABLET | Freq: Every day | ORAL | 5 refills | Status: AC

## 2021-12-11 NOTE — Patient Instructions (Signed)
Upper airway cough syndrome: Take Delsym twice a day as directed Stop taking cetirizine for now Use Chlor-Trimeton every 8 hours for the next 7 days You need to try to suppress your cough to allow your larynx (voice box) to heal.  For three days don't talk, laugh, sing, or clear your throat. Do everything you can to suppress the cough during this time. Use hard candies (sugarless Jolly Ranchers) or non-mint or non-menthol containing cough drops during this time to soothe your throat.  Use a cough suppressant (Delsym or what I have prescribed you) around the clock during this time.  After three days, gradually increase the use of your voice and back off on the cough suppressants.  Gastroesophageal reflux disease: Continue Protonix and Pepcid as you are doing Avoid fatty foods, alcohol, chocolate, caffeine and tobacco products No eating within 3 hours of bedtime  Follow up with Dr. Lamonte Sakai as previously scheduled

## 2021-12-11 NOTE — Progress Notes (Signed)
Synopsis: Followed by Dr. Lamonte Sakai for persistent pulmonary infiltrates thought to be possible sarcoidosis and upper airway cough syndrome.    Subjective:   PATIENT ID: Diana Blair GENDER: female DOB: 1981/02/11, MRN: [id number]   HPI  Chief Complaint  Patient presents with   Acute Visit    Cough has not improved to her satisfaction. She has a dry cough and "gags". She states she sometimes develops a headache when she has coughing spell.     Last seen by [place] a few weeks ago for upper airway cough syndrome, treated with antiacid therapy and antibiotics and steroids.  She says that the cough has been persistent since we saw her, although it is no longer productive.  She took the prednisone, eventually started taking the azithromycin and the productive cough stopped.  She does not have fevers, chills, or shortness of breath.  However, she continues to have a strong hacking cough.  She has been taking her Protonix and Pepcid.  She never took the Delsym because she couldn't afford it. She has not attempted voice rest.  Past Medical History:  Diagnosis Date   Acid reflux    Anemia    B12 deficiency    Bilateral swelling of feet    Chronic hypertension in obstetric context in first trimester 05/04/2016   Diverticulitis    Heart palpitations    High cholesterol    Hypertension    Morbid obesity with BMI of 50.0-59.9, adult (HCC)    BMI 58.8   PCOS (polycystic ovarian syndrome)    Prediabetes    Sarcoidosis    Seizure ([place])    as a child   Sleep apnea    SOB (shortness of breath)    Vitamin D deficiency       Review of Systems  Constitutional: Negative.  Negative for chills, fever and malaise/fatigue.  HENT: Negative.  Negative for congestion, sinus pain and sore throat.       Objective:  Physical Exam   Vitals:   12/11/21 1216  BP: 134/74  Pulse: 97  Temp: 99.9 F (37.7 C)  TempSrc: Oral  SpO2: 99%  Weight: (!) 388 lb (176 kg)  Height: '5\' 3"'$  (1.6 m)     General:  Morbidly obese, resting comfortably in chair HENT: NCAT OP clear PULM: CTA B, normal effort CV: RRR, no mgr GI: BS+, soft, nontender MSK: normal bulk and tone Neuro: awake, alert, no distress, MAEW    CBC    Component Value Date/Time   WBC 7.2 03/25/2021 1109   RBC 3.72 (L) 03/25/2021 1109   HGB 9.0 (L) 03/25/2021 1109   HGB 12.2 12/31/2020 1047   HGB 12.9 02/08/2017 1524   HCT 30.2 (L) 03/25/2021 1109   HCT 38.0 02/08/2017 1524   PLT 330 03/25/2021 1109   PLT 286 12/31/2020 1047   PLT 253 02/08/2017 1524   MCV 81.2 03/25/2021 1109   MCV 82 02/08/2017 1524   MCH 24.2 (L) 03/25/2021 1109   MCHC 29.8 (L) 03/25/2021 1109   RDW 17.0 (H) 03/25/2021 1109   RDW 16.3 (H) 02/08/2017 1524   LYMPHSABS 907 03/25/2021 1109   MONOABS 0.7 02/09/2021 1246   EOSABS 130 03/25/2021 1109   BASOSABS 22 03/25/2021 1109     Chest imaging: February 2023 CT chest images independently reviewed showing a nodular and linear infiltrate bilaterally, no adenopathy July CXR > infiltrates have cleared up  PFT:  Labs:  Path:  Echo:  Heart Catheterization:  Assessment & Plan:   Upper airway cough syndrome  Gastroesophageal reflux disease without esophagitis  Discussion: She has persistent upper airway cough syndrome, no evidence of underlying sarcoidosis causing this as her most recent chest x-ray was normal.  She also has no evidence of an underlying infection as she is afebrile and is not producing mucus.  Unfortunately, she never took the cough medicine or practice voice rest which we recommended on the previous visit.  Plan: Upper airway cough syndrome: Take Delsym twice a day as directed Stop taking cetirizine for now Use Chlor-Trimeton every 8 hours for the next 7 days You need to try to suppress your cough to allow your larynx (voice box) to heal.  For three days don't talk, laugh, sing, or clear your throat. Do everything you can to suppress the cough  during this time. Use hard candies (sugarless Jolly Ranchers) or non-mint or non-menthol containing cough drops during this time to soothe your throat.  Use a cough suppressant (Delsym or what I have prescribed you) around the clock during this time.  After three days, gradually increase the use of your voice and back off on the cough suppressants.  Gastroesophageal reflux disease: Continue Protonix and Pepcid as you are doing Avoid fatty foods, alcohol, chocolate, caffeine and tobacco products No eating within 3 hours of bedtime  Follow up with Dr. Lamonte Sakai as previously scheduled    Current Outpatient Medications:    Biotin w/ Vitamins C & E (HAIR/SKIN/NAILS) 1250-7.5-7.5 MCG-MG-UNT CHEW, Chew by mouth., Disp: , Rfl:    cetirizine (ZYRTEC ALLERGY) 10 MG tablet, Take 1 tablet (10 mg total) by mouth daily., Disp: 30 tablet, Rfl: 5   cetirizine (ZYRTEC) 10 MG tablet, Take 10 mg by mouth daily., Disp: , Rfl:    chlorpheniramine (CHLOR-TRIMETON) 4 MG tablet, Take 1 tablet (4 mg total) by mouth every 8 (eight) hours., Disp: 21 tablet, Rfl: 0   Cholecalciferol (D3-1000) 25 MCG (1000 UT) capsule, Take 1,000 Units by mouth daily., Disp: , Rfl:    dextromethorphan (DELSYM) 30 MG/5ML liquid, Take 5 mLs (30 mg total) by mouth 2 (two) times daily as needed for cough., Disp: 148 mL, Rfl: 1   famotidine (PEPCID) 20 MG tablet, TAKE 1 TABLET(20 MG) BY MOUTH AT BEDTIME, Disp: 90 tablet, Rfl: 3   Ferrous Sulfate Dried (HIGH POTENCY IRON) 65 MG TABS, Take by mouth., Disp: , Rfl:    ibuprofen (ADVIL) 600 MG tablet, Take 1 tablet (600 mg total) by mouth every 8 (eight) hours as needed for up to 30 doses for fever, headache, mild pain or moderate pain (Inflammation). Take 1 tablet 3 times daily as needed for inflammation of upper airways and/or pain., Disp: 30 tablet, Rfl: 0   methylcellulose (CITRUCEL) oral powder, Take 1 packet by mouth daily., Disp: , Rfl:    pantoprazole (PROTONIX) 40 MG tablet, TAKE 1 TABLET(40  MG) BY MOUTH DAILY, Disp: 90 tablet, Rfl: 3   pyridOXINE (VITAMIN B-6) 100 MG tablet, Take 100 mg by mouth daily., Disp: , Rfl:    vitamin B-12 (CYANOCOBALAMIN) 1000 MCG tablet, Take 1,000 mcg by mouth daily., Disp: , Rfl:    promethazine-dextromethorphan (PROMETHAZINE-DM) 6.25-15 MG/5ML syrup, Take 5 mLs by mouth 4 (four) times daily as needed for cough. (Patient not taking: Reported on 12/11/2021), Disp: 118 mL, Rfl: 0

## 2022-02-01 ENCOUNTER — Telehealth: Payer: Self-pay | Admitting: Emergency Medicine

## 2022-02-01 MED ORDER — DOXYCYCLINE HYCLATE 100 MG PO TABS: 100.0000 mg | ORAL_TABLET | Freq: Two times a day (BID) | ORAL | 0 refills | Status: DC

## 2022-02-01 NOTE — Telephone Encounter (Signed)
Please give her script for doxycycline '100mg'$  bid x 7 days and have her make acute OV w APP or RB in about 1 week to assess her status

## 2022-02-01 NOTE — Telephone Encounter (Signed)
Patient states she never got better from being sick a few weeks ago. States she is having a lot of drainage/mucus in throat. Coughing is getting out of control. No tightness in chest, but feels like she needs antibiotic not prednisone. Using [facility] in [place] on [address]

## 2022-02-01 NOTE — Telephone Encounter (Signed)
Called and spoke with pt letting her know recs per RB and she verablized understanding. Rx for doxy has been sent to preferred pharmacy and appt has been scheduled for pt to reassess after starting meds. Nothing further needed.

## 2022-02-01 NOTE — Telephone Encounter (Signed)
Called and spoke with patient. Patient stated that she is still sick. Patient states she is still having drainage, still coughing and it's becoming worse, and she's coughing up yellow and green mucus. Patient also stated that when she coughs, it feels like she is going to gag.   RB, please advise.

## 2022-02-11 ENCOUNTER — Telehealth (INDEPENDENT_AMBULATORY_CARE_PROVIDER_SITE_OTHER): Payer: Medicaid Other | Admitting: Adult Health

## 2022-02-11 DIAGNOSIS — D869 Sarcoidosis, unspecified: Secondary | ICD-10-CM | POA: Diagnosis not present

## 2022-02-11 DIAGNOSIS — J209 Acute bronchitis, unspecified: Secondary | ICD-10-CM | POA: Diagnosis not present

## 2022-02-11 DIAGNOSIS — R058 Other specified cough: Secondary | ICD-10-CM

## 2022-02-11 NOTE — Patient Instructions (Addendum)
Delsym 2 tsp Twice daily  As needed  cough.  Zyrtec '10mg'$  daily  Protonix '40mg'$  daily  Pepcid '20mg'$  At bedtime   Activity as tolearted  Chlor tabs '4mg'$  At bedtime  As needed  drainage  Follow up with Dr. Lamonte Sakai  in 4-6 months and As needed

## 2022-02-11 NOTE — Progress Notes (Signed)
0Virtual Visit via Video Note  I connected with Diana Blair on 02/11/22 at 10:00 AM EDT by a video enabled telemedicine application and verified that I am speaking with the correct person using two identifiers.  Location: Patient: Home  Provider: Office    I discussed the limitations of evaluation and management by telemedicine and the availability of in person appointments. The patient expressed understanding and agreed to proceed.  History of Present Illness: 41 year old former smoker followed for suspected sarcoidosis with waxing and waning pulmonary infiltrates and rash, elevated ACE level discharged to and upper airway cough syndrome  Today's video visit is a 18-monthfollow-up.  Patient has been having a slow to resolve URI/bronchitis and flare of chronic cough.  Patient was given a 7-day course of doxycycline.  Patient says that she is feeling much better.  Cough and congestion have decreased. Using delsym for cough control. Remains on Zyrtec .  Denies rash , hemoptysis , chest pain or edema Remains on Protonix and Pepcid for GERD.   Past Medical History:  Diagnosis Date   Acid reflux    Anemia    B12 deficiency    Bilateral swelling of feet    Chronic hypertension in obstetric context in first trimester 05/04/2016   Diverticulitis    Heart palpitations    High cholesterol    Hypertension    Morbid obesity with BMI of 50.0-59.9, adult (HCC)    BMI 58.8   PCOS (polycystic ovarian syndrome)    Prediabetes    Sarcoidosis    [place]   Sleep apnea    SOB (shortness of breath)    Vitamin D deficiency    Current Outpatient Medications on File Prior to Visit  Medication Sig Dispense Refill   Biotin w/ Vitamins C & E (HAIR/SKIN/NAILS) 1250-7.5-7.5 MCG-MG-UNT CHEW Chew by mouth.     cetirizine (ZYRTEC ALLERGY) 10 MG tablet Take 1 tablet (10 mg total) by mouth daily. 30 tablet 5   cetirizine (ZYRTEC) 10 MG tablet Take 10 mg by mouth daily.      chlorpheniramine (CHLOR-TRIMETON) 4 MG tablet Take 1 tablet (4 mg total) by mouth every 8 (eight) hours. 21 tablet 0   Cholecalciferol (D3-1000) 25 MCG (1000 UT) capsule Take 1,000 Units by mouth daily.     dextromethorphan (DELSYM) 30 MG/5ML liquid Take 5 mLs (30 mg total) by mouth 2 (two) times daily as needed for cough. 148 mL 1   doxycycline (VIBRA-TABS) 100 MG tablet Take 1 tablet (100 mg total) by mouth 2 (two) times daily. 14 tablet 0   famotidine (PEPCID) 20 MG tablet TAKE 1 TABLET(20 MG) BY MOUTH AT BEDTIME 90 tablet 3   Ferrous Sulfate Dried (HIGH POTENCY IRON) 65 MG TABS Take by mouth.     ibuprofen (ADVIL) 600 MG tablet Take 1 tablet (600 mg total) by mouth every 8 (eight) hours as needed for up to 30 doses for fever, headache, mild pain or moderate pain (Inflammation). Take 1 tablet 3 times daily as needed for inflammation of upper airways and/or pain. 30 tablet 0   methylcellulose (CITRUCEL) oral powder Take 1 packet by mouth daily.     pantoprazole (PROTONIX) 40 MG tablet TAKE 1 TABLET(40 MG) BY MOUTH DAILY 90 tablet 3   promethazine-dextromethorphan (PROMETHAZINE-DM) 6.25-15 MG/5ML syrup Take 5 mLs by mouth 4 (four) times daily as needed for cough. 118 mL 0   pyridOXINE (VITAMIN B-6) 100 MG tablet Take 100 mg by mouth  daily.     vitamin B-12 (CYANOCOBALAMIN) 1000 MCG tablet Take 1,000 mcg by mouth daily.     [DISCONTINUED] albuterol (PROVENTIL HFA;VENTOLIN HFA) 108 (90 Base) MCG/ACT inhaler Inhale 1-2 puffs into the lungs every 4 (four) hours as needed for wheezing or shortness of breath (or cough). 1 Inhaler 0   No current facility-administered medications on file prior to visit.       Observations/Objective: CT scan of the chest 02/21/2019  shows bilateral focal opacities in her peribronchial, sometimes nodular masslike.  No effusions, no mediastinal hilar or axillary lymphadenopathy.   Assessment and Plan: Acute URI /Bronchitis - Clinically improved with antibiotics.    Sarcoid -appears stable   UACS - continue with trigger prevention -AR /gastroesophageal reflux disease control   Plan  Patient Instructions  Delsym 2 tsp Twice daily  As needed  cough.  Zyrtec '10mg'$  daily  Protonix '40mg'$  daily  Pepcid '20mg'$  At bedtime   Activity as tolearted  Chlor tabs '4mg'$  At bedtime  As needed  drainage  Follow up with Dr. Lamonte Sakai  in 4-6 months and As needed        Follow Up Instructions:    I discussed the assessment and treatment plan with the patient. The patient was provided an opportunity to ask questions and all were answered. The patient agreed with the plan and demonstrated an understanding of the instructions.   The patient was advised to call back or seek an in-person evaluation if the symptoms worsen or if the condition fails to improve as anticipated.  I provided 20  minutes of non-face-to-face time during this encounter.   Rexene Edison, NP

## 2022-04-30 NOTE — Progress Notes (Signed)
PA submitted through [facility] provider portal to check CPT codes for procedure : Cystoscopy CPT code: 52000  NO PA needed for this procedure. Decision ID#: [id number]

## 2022-05-13 ENCOUNTER — Encounter: Payer: Self-pay | Admitting: Obstetrics and Gynecology

## 2022-05-13 ENCOUNTER — Ambulatory Visit (INDEPENDENT_AMBULATORY_CARE_PROVIDER_SITE_OTHER): Payer: Medicaid Other | Admitting: Obstetrics and Gynecology

## 2022-05-13 VITALS — BP 128/65 | HR 86

## 2022-05-13 DIAGNOSIS — R319 Hematuria, unspecified: Secondary | ICD-10-CM

## 2022-05-13 DIAGNOSIS — R350 Frequency of micturition: Secondary | ICD-10-CM

## 2022-05-13 DIAGNOSIS — N3281 Overactive bladder: Secondary | ICD-10-CM

## 2022-05-13 LAB — POCT URINALYSIS DIPSTICK
Bilirubin, UA: NEGATIVE
Glucose, UA: NEGATIVE
Ketones, UA: NEGATIVE
Leukocytes, UA: NEGATIVE
Nitrite, UA: NEGATIVE
Protein, UA: POSITIVE — AB
Spec Grav, UA: >=1.03 — AB (ref 1.010–1.025)
Urobilinogen, UA: 0.2 E.U./dL
pH, UA: 5.5 (ref 5.0–8.0)

## 2022-05-13 MED ORDER — LIDOCAINE HCL URETHRAL/MUCOSAL 2 % EX GEL
1.0000 | Freq: Once | CUTANEOUS | Status: AC
  Administered 2022-05-13: 1 via URETHRAL

## 2022-05-13 MED ORDER — TROSPIUM CHLORIDE ER 60 MG PO CP24: 1.0000 | ORAL_CAPSULE | Freq: Every day | ORAL | 5 refills | Status: DC

## 2022-05-13 NOTE — Progress Notes (Signed)
[facility] Return Visit   SUBJECTIVE  History of Present Illness: Diana Blair is a 42 y.o. female seen in follow-up for incontinence.   Feels that the pelvic PT did help her to gain better control when she has urgency. But she has times frequently where the urine comes out without warning when she is just sitting. Rare leakage with cough or sneeze but sometimes has leakage on standing after she gets an urge.   She notes that she is having a workup by Neurology for decreased sensation in her lower extremities.   She is mostly drinking water, tries to stay away from caffeine.   Urodynamic Impression:  1. Sensation was normal; capacity was normal 2. Stress Incontinence was demonstrated at normal pressures; 3. Detrusor Overactivity was not demonstrated. 4. Emptying was normal with a normal PVR, a sustained detrusor contraction present,  abdominal straining not present, normal urethral sphincter activity on EMG.  Past Medical History: Patient  has a past medical history of Acid reflux, Anemia, B12 deficiency, Bilateral swelling of feet, Chronic hypertension in obstetric context in first trimester (05/04/2016), Diverticulitis, Heart palpitations, High cholesterol, Hypertension, Morbid obesity with BMI of 50.0-59.9, adult ([place]), PCOS (polycystic ovarian syndrome), Prediabetes, Sarcoidosis, Seizure ([place]), Sleep apnea, SOB (shortness of breath), and Vitamin D deficiency.   Past Surgical History: She  has a past surgical history that includes Cervical cerclage (03/13/2012); Cervical cerclage (03/17/2012); Colonoscopy with propofol (N/A, 10/02/2020); Esophagogastroduodenoscopy (egd) with propofol (N/A, 10/02/2020); and polypectomy (10/02/2020).   Medications: She has a current medication list which includes the following prescription(s): hair/skin/nails, cetirizine, cetirizine, dextromethorphan, famotidine, citrucel, pantoprazole, pyridoxine, trospium chloride, cyanocobalamin, d3-1000,  high potency iron, ibuprofen, and [DISCONTINUED] albuterol.   Allergies: Patient has No Known Allergies.   Social History: Patient  reports that she quit smoking about 13 years ago. Her smoking use included cigarettes. She has a 9.00 pack-year smoking history. She has never used smokeless tobacco. She reports that she does not drink alcohol and does not use drugs.      OBJECTIVE     Physical Exam: Vitals:   05/13/22 0806  BP: 128/65  Pulse: 86   Gen: No apparent distress, A&O x 3.  Detailed Urogynecologic Evaluation:  Deferred.    ASSESSMENT AND PLAN    Diana Blair is a 42 y.o. with:  1. Urinary frequency   2. Overactive bladder    - We discussed management with physical therapy, medication; for refractory cases posterior tibial nerve stimulation, sacral neuromodulation, and intravesical botulinum toxin injection.  - Prescribed trospium '60mg'$  ER daily. For anticholinergic medications, we discussed the potential side effects of anticholinergics including dry eyes, dry mouth, constipation, cognitive impairment and urinary retention. - We also reviewed third line therapies. She would not be a good candidate for PTNS due to decreased sensation and possible neurologic issue with her LE.  - SUI not as bothersome at this time. We reviewed that we could trial a pessary if she would like  Follow up 6 weeks  Jaquita Folds, MD

## 2022-05-13 NOTE — Progress Notes (Signed)
CYSTOSCOPY  CC:  This is a 42 y.o. with gross hematuria who presents today for cystoscopy. She reports she has not noticed any blood recently.   POC urine: moderate blood  BP 128/65   Pulse 86   LMP 05/08/2022   CYSTOSCOPY: A time out was performed.  The periurethral area was prepped and draped in a sterile manner.  2% lidocaine jetpack was inserted at the urethral meatus. The urethra and bladder visualized with a 70-degree scope.  She had normal urethral coaptation and normal urethral mucosa.  She had normal bladder mucosa. She had bilateral clear efflux from both ureteral orifices.  She had no squamous metaplasia at the trigone, no trabeculations, cellules or diverticuli.    CT A/P w/wo contrast on 07/20/21: IMPRESSION: 1. No CT findings to explain hematuria. No evidence of urinary tract calculus, mass, or hydronephrosis. No urinary tract filling defect on delayed phase imaging. 2. Hepatomegaly and hepatic steatosis.    ASSESSMENT:  42 y.o. with gross hematuria. Cystoscopy today is normal.   Jaquita Folds, MD

## 2022-05-19 ENCOUNTER — Ambulatory Visit (INDEPENDENT_AMBULATORY_CARE_PROVIDER_SITE_OTHER): Payer: Medicaid Other | Admitting: Obstetrics and Gynecology

## 2022-05-19 ENCOUNTER — Other Ambulatory Visit: Payer: Self-pay | Admitting: Obstetrics and Gynecology

## 2022-05-19 ENCOUNTER — Encounter: Payer: Self-pay | Admitting: Obstetrics and Gynecology

## 2022-05-19 VITALS — BP 133/92 | HR 99 | Wt >= 6400 oz

## 2022-05-19 DIAGNOSIS — N6452 Nipple discharge: Secondary | ICD-10-CM | POA: Diagnosis not present

## 2022-05-19 NOTE — Progress Notes (Signed)
ANNUAL EXAM Patient name: Diana Blair MRN [id number]  Date of birth: 01/29/1981 Chief Complaint:   No chief complaint on file.  History of Present Illness:   Diana Blair is a 42 y.o. G2P1102 being seen today for a routine annual exam.  Current complaints: annual   Patient's last menstrual period was 05/08/2022.   Right breast leakage of clear fluid from nipple. No breast pain, discomfort and sensitive. 2 weeks ago.   Previous menstrual issues in 2022. Does not want SIT. No intercourse in 6 years due to marital issues - separated from partner and has not felt comfortable with dating/intercourse with a new partner.   Has also had issues with loss of grip strength and difficulty with ambulation - seeing neuro and rheum.  The pregnancy intention screening data noted above was reviewed. Potential methods of contraception were discussed. The patient elected to proceed with No data recorded.   Last pap     Component Value Date/Time   DIAGPAP  10/03/2019 1649    - Negative for intraepithelial lesion or malignancy (NILM)   [place] Negative 10/03/2019 1649   ADEQPAP  10/03/2019 1649    Satisfactory for evaluation; transformation zone component PRESENT.   Last mammogram: none prior Last colonoscopy:09/2020     05/19/2022    5:29 PM 05/13/2021    9:16 AM 01/23/2020    2:23 PM 02/08/2017    2:35 PM 11/02/2016    1:55 PM  Depression screen PHQ 2/9  Decreased Interest 0 0 0 0 0  Down, Depressed, Hopeless 0 1 0 0 0  PHQ - 2 Score 0 1 0 0 0  Altered sleeping '3 2  2 1  '$ Tired, decreased energy '3 3  2 1  '$ Change in appetite 1 0  0 0  Feeling bad or failure about yourself  0 0  0 0  Trouble concentrating 0 1  0 0  Moving slowly or fidgety/restless 0 3  0 0  Suicidal thoughts 0 0  0 0  PHQ-9 Score '7 10  4 2  '$ Difficult doing work/chores  Extremely dIfficult           05/19/2022    5:29 PM 02/08/2017    2:35 PM 11/02/2016    1:55 PM 10/05/2016    3:00 PM  GAD 7 : Generalized  Anxiety Score  Nervous, Anxious, on Edge 1 0 1 1  Control/stop worrying 0 0 1 0  Worry too much - different things 0 0 0 0  Trouble relaxing 1 0 1 1  Restless 0 2 0 0  Easily annoyed or irritable '1 2 1 1  '$ Afraid - awful might happen 1 0 1 1  Total GAD 7 Score '4 4 5 4     '$ Review of Systems:   Pertinent items are noted in HPI Denies any headaches, blurred vision, fatigue, shortness of breath, chest pain, abdominal pain, abnormal vaginal discharge/itching/odor/irritation, problems with periods, bowel movements, urination, or intercourse unless otherwise stated above. Pertinent History Reviewed:  Reviewed past medical,surgical, social and family history.  Reviewed problem list, medications and allergies. Physical Assessment:   Vitals:   05/19/22 1506  BP: (!) 133/92  Pulse: 99  Weight: (!) 402 lb 6.4 oz (182.5 kg)  Body mass index is 71.28 kg/m.        Physical Examination:   General appearance - well appearing, and in no distress  Mental status - alert, oriented to person, place, and time  Psych:  She  has a normal mood and affect  Skin - warm and dry, normal color, no suspicious lesions noted  Chest - effort normal, all lung fields clear to auscultation bilaterally  Heart - normal rate and regular rhythm  Breasts - breasts appear normal, no suspicious masses, no skin or nipple changes or  axillary nodes, no discharge on examination   Abdomen - soft, nontender, nondistended, no masses or organomegaly  Pelvic - deferred  Extremities:  No swelling or varicosities noted  Chaperone present for exam  No results found for this or any previous visit (from the past 24 hour(s)).    Assessment & Plan:  1. Nipple discharge Additional labs for nipple discharge workup. Mammogram ordered - due for screening and recent complaint of nipple discharge (though resolved with application of hot compress at home).  - Prolactin - TSH Rfx on Abnormal to Free T4 - MM DIAG BREAST TOMO BILATERAL;  Future  Mammogram: @ 42yo, or sooner if problems Colonoscopy: per GI  Orders Placed This Encounter  Procedures   MM DIAG BREAST TOMO BILATERAL   Prolactin   TSH Rfx on Abnormal to Free T4    Meds: No orders of the defined types were placed in this encounter.   Follow-up: No follow-ups on file.  Darliss Cheney, MD 05/19/2022 6:53 PM

## 2022-05-20 LAB — TSH RFX ON ABNORMAL TO FREE T4: TSH: 0.753 u[IU]/mL (ref 0.450–4.500)

## 2022-05-20 LAB — PROLACTIN: Prolactin: 19.7 ng/mL (ref 4.8–33.4)

## 2022-05-27 ENCOUNTER — Other Ambulatory Visit: Payer: Medicaid Other

## 2022-06-10 ENCOUNTER — Ambulatory Visit
Admission: RE | Admit: 2022-06-10 | Discharge: 2022-06-10 | Disposition: A | Discharge status: Home / Self Care | Payer: Medicaid Other | Source: Ambulatory Visit | Attending: Obstetrics and Gynecology | Admitting: Obstetrics and Gynecology

## 2022-06-10 DIAGNOSIS — N6452 Nipple discharge: Secondary | ICD-10-CM

## 2022-06-14 ENCOUNTER — Encounter: Payer: Self-pay | Admitting: *Deleted

## 2022-06-23 ENCOUNTER — Other Ambulatory Visit: Payer: Self-pay | Admitting: Obstetrics and Gynecology

## 2022-06-23 DIAGNOSIS — N3281 Overactive bladder: Secondary | ICD-10-CM

## 2022-06-23 MED ORDER — SOLIFENACIN SUCCINATE 10 MG PO TABS: 10.0000 mg | ORAL_TABLET | Freq: Every day | ORAL | 5 refills | Status: AC

## 2022-06-23 NOTE — Progress Notes (Signed)
Trospium not covered. Rx sent for vesicare '10mg'$  as an alternative.

## 2022-06-24 ENCOUNTER — Ambulatory Visit: Payer: Medicaid Other | Admitting: Obstetrics and Gynecology

## 2022-09-01 ENCOUNTER — Telehealth: Payer: Self-pay

## 2022-09-07 NOTE — Telephone Encounter (Signed)
Erroneous entry

## 2022-10-06 ENCOUNTER — Ambulatory Visit (INDEPENDENT_AMBULATORY_CARE_PROVIDER_SITE_OTHER): Payer: Medicaid Other | Admitting: Obstetrics & Gynecology

## 2022-10-06 VITALS — BP 121/80 | HR 93 | Wt 398.0 lb

## 2022-10-06 DIAGNOSIS — N939 Abnormal uterine and vaginal bleeding, unspecified: Secondary | ICD-10-CM

## 2022-10-06 DIAGNOSIS — E282 Polycystic ovarian syndrome: Secondary | ICD-10-CM

## 2022-10-06 DIAGNOSIS — N3946 Mixed incontinence: Secondary | ICD-10-CM

## 2022-10-06 MED ORDER — MIRABEGRON ER 50 MG PO TB24: 50.0000 mg | ORAL_TABLET | Freq: Every day | ORAL | 5 refills | Status: AC

## 2022-10-06 NOTE — Progress Notes (Signed)
Patient ID: Diana Blair, female   DOB: 1980-05-02, 42 y.o.   MRN: [id number]  Chief Complaint  Patient presents with   Amenorrhea  Patient's last menstrual period was 08/13/2022.   HPI Diana Blair is a 42 y.o. female.  [id number] Patient's last menstrual period was 08/13/2022. She is abstinent. Her menses last about 5 days and she occasionally passes a blood clot and have cramps. Occasionally her cycle will be late as it is now. She also reports urge urinary incontinence and has been seen by C. Ashley Royalty MD and she has tried to schedule with Joselyn Glassman MD. Her PCP is working on urology f/u HPI   Past Medical History:  Diagnosis Date   Acid reflux    Anemia    B12 deficiency    Bilateral swelling of feet    Chronic hypertension in obstetric context in first trimester 05/04/2016   Diverticulitis    Heart palpitations    High cholesterol    Hypertension    Morbid obesity with BMI of 50.0-59.9, adult (HCC)    BMI 58.8   PCOS (polycystic ovarian syndrome)    Prediabetes    Sarcoidosis    Seizure (HCC)    as a child   Sleep apnea    SOB (shortness of breath)    Vitamin D deficiency     Past Surgical History:  Procedure Laterality Date   CERVICAL CERCLAGE  03/13/2012   Procedure: CERCLAGE CERVICAL;  Surgeon: Tilda Burrow, MD;  Location: [facility];  Service: Gynecology;  Laterality: N/A;   CERVICAL CERCLAGE  03/17/2012   Procedure: CERCLAGE CERVICAL;  Surgeon: Tereso Newcomer, MD;  Location: [facility];  Service: Gynecology;  Laterality: N/A;  Removal   COLONOSCOPY WITH PROPOFOL N/A 10/02/2020   Procedure: COLONOSCOPY WITH PROPOFOL;  Surgeon: Rachael Fee, MD;  Location: [facility];  Service: Endoscopy;  Laterality: N/A;   ESOPHAGOGASTRODUODENOSCOPY (EGD) WITH PROPOFOL N/A 10/02/2020   Procedure: ESOPHAGOGASTRODUODENOSCOPY (EGD) WITH PROPOFOL;  Surgeon: Rachael Fee, MD;  Location: [facility];  Service: Endoscopy;  Laterality: N/A;   POLYPECTOMY  10/02/2020    Procedure: POLYPECTOMY;  Surgeon: Rachael Fee, MD;  Location: [facility];  Service: Endoscopy;;    Family History  Problem Relation Age of Onset   Cancer Mother        lung   Hypertension Mother    Hyperlipidemia Father    Hypertension Father    Diabetes Father    Heart failure Father    Hypertension Sister    Eczema Sister    Hypertension Brother    Diabetes Paternal Grandmother    Other Neg Hx    Colon cancer Neg Hx    Pancreatic cancer Neg Hx    Esophageal cancer Neg Hx     Social History Social History   Tobacco Use   Smoking status: Former    Packs/day: 1.50    Years: 6.00    Additional pack years: 0.00    Total pack years: 9.00    Types: Cigarettes    Quit date: 02/08/2009    Years since quitting: 13.6   Smokeless tobacco: Never   Tobacco comments:    Smoked off/on x 6 years  Vaping Use   Vaping Use: Never used  Substance Use Topics   Alcohol use: No   Drug use: No    No Known Allergies  Current Outpatient Medications  Medication Sig Dispense Refill   cetirizine (ZYRTEC ALLERGY) 10 MG tablet Take 1 tablet (10  mg total) by mouth daily. 30 tablet 5   Cholecalciferol (D3-1000) 25 MCG (1000 UT) capsule Take 1,000 Units by mouth daily.     Ferrous Sulfate Dried (HIGH POTENCY IRON) 65 MG TABS Take by mouth.     mirabegron ER (MYRBETRIQ) 50 MG TB24 tablet Take 1 tablet (50 mg total) by mouth daily. 30 tablet 5   Biotin w/ Vitamins C & E (HAIR/SKIN/NAILS) 1250-7.5-7.5 MCG-MG-UNT CHEW Chew by mouth. (Patient not taking: Reported on 10/06/2022)     cetirizine (ZYRTEC) 10 MG tablet Take 10 mg by mouth daily.     dextromethorphan (DELSYM) 30 MG/5ML liquid Take 5 mLs (30 mg total) by mouth 2 (two) times daily as needed for cough. (Patient not taking: Reported on 10/06/2022) 148 mL 1   famotidine (PEPCID) 20 MG tablet TAKE 1 TABLET(20 MG) BY MOUTH AT BEDTIME (Patient not taking: Reported on 10/06/2022) 90 tablet 3   ibuprofen (ADVIL) 600 MG tablet Take 1 tablet  (600 mg total) by mouth every 8 (eight) hours as needed for up to 30 doses for fever, headache, mild pain or moderate pain (Inflammation). Take 1 tablet 3 times daily as needed for inflammation of upper airways and/or pain. (Patient not taking: Reported on 05/13/2022) 30 tablet 0   methylcellulose (CITRUCEL) oral powder Take 1 packet by mouth daily.     pantoprazole (PROTONIX) 40 MG tablet TAKE 1 TABLET(40 MG) BY MOUTH DAILY 90 tablet 3   pyridOXINE (VITAMIN B-6) 100 MG tablet Take 100 mg by mouth daily.     solifenacin (VESICARE) 10 MG tablet Take 1 tablet (10 mg total) by mouth daily. (Patient not taking: Reported on 10/06/2022) 30 tablet 5   vitamin B-12 (CYANOCOBALAMIN) 1000 MCG tablet Take 1,000 mcg by mouth daily.     No current facility-administered medications for this visit.    Review of Systems Review of Systems  Constitutional: Negative.   HENT: Negative.    Respiratory:  Positive for shortness of breath.   Cardiovascular: Negative.   Gastrointestinal: Negative.   Genitourinary:  Positive for menstrual problem. Negative for pelvic pain, vaginal bleeding and vaginal discharge.  Musculoskeletal:  Positive for gait problem.       Uses a cane    Blood pressure 121/80, pulse 93, weight (!) 398 lb (180.5 kg), last menstrual period 08/13/2022.  Physical Exam Physical Exam Vitals and nursing note reviewed.  Constitutional:      Appearance: She is obese. She is not ill-appearing.  Cardiovascular:     Rate and Rhythm: Normal rate.  Pulmonary:     Effort: Pulmonary effort is normal.  Musculoskeletal:     Cervical back: Normal range of motion.  Psychiatric:        Mood and Affect: Mood normal.        Behavior: Behavior normal.     Data Reviewed CT scan normal pelvis  Assessment   Mixed stress and urge urinary incontinence - Plan: mirabegron ER (MYRBETRIQ) 50 MG TB24 tablet  PCOS (polycystic ovarian syndrome)  Abnormal uterine bleeding Discussed LNGIUD for  control Plan RTC for possible LNGIUD for menorrhagia  Urology f/u for incontinence bu try Myrbetriq  40 min face to face review of chart and documentation  Scheryl Darter 10/06/2022, 4:32 PM

## 2022-10-06 NOTE — Progress Notes (Signed)
Pt states she has not had a cycle in a few months. Pt states she is not sexually active, no birth control.  Pt states she has tried to see uro/gyn, hasn't had much success with help.

## 2022-10-20 ENCOUNTER — Other Ambulatory Visit: Payer: Self-pay | Admitting: *Deleted

## 2022-10-20 NOTE — Progress Notes (Signed)
Prior auth Myrbetriq denied. Pt notified via My Chart.

## 2022-10-20 NOTE — Progress Notes (Signed)
Prior [employer] generic submitted via Cover My Meds.

## 2022-12-03 ENCOUNTER — Other Ambulatory Visit: Payer: Self-pay | Admitting: Gastroenterology

## 2022-12-03 NOTE — Telephone Encounter (Signed)
Ok, this med is also OTC

## 2022-12-03 NOTE — Telephone Encounter (Signed)
Sent to DOD to advise. Thank you Sir.

## 2024-01-23 ENCOUNTER — Ambulatory Visit: Admitting: Family Medicine

## 2024-05-07 ENCOUNTER — Encounter: Payer: Self-pay | Admitting: *Deleted
